# Patient Record
Sex: Female | Born: 1935
Health system: Southern US, Community
[De-identification: ages and names within clinical notes are randomized; demographics above are authoritative.]

## PROBLEM LIST (undated history)

## (undated) DIAGNOSIS — IMO0001 Reserved for inherently not codable concepts without codable children: Secondary | ICD-10-CM

## (undated) DIAGNOSIS — K219 Gastro-esophageal reflux disease without esophagitis: Secondary | ICD-10-CM

## (undated) DIAGNOSIS — M1990 Unspecified osteoarthritis, unspecified site: Secondary | ICD-10-CM

## (undated) DIAGNOSIS — E039 Hypothyroidism, unspecified: Secondary | ICD-10-CM

## (undated) DIAGNOSIS — K2270 Barrett's esophagus without dysplasia: Secondary | ICD-10-CM

## (undated) DIAGNOSIS — K754 Autoimmune hepatitis: Secondary | ICD-10-CM

## (undated) DIAGNOSIS — D649 Anemia, unspecified: Secondary | ICD-10-CM

## (undated) DIAGNOSIS — I10 Essential (primary) hypertension: Secondary | ICD-10-CM

## (undated) DIAGNOSIS — E538 Deficiency of other specified B group vitamins: Secondary | ICD-10-CM

## (undated) DIAGNOSIS — N189 Chronic kidney disease, unspecified: Secondary | ICD-10-CM

## (undated) DIAGNOSIS — N6019 Diffuse cystic mastopathy of unspecified breast: Secondary | ICD-10-CM

## (undated) DIAGNOSIS — K5790 Diverticulosis of intestine, part unspecified, without perforation or abscess without bleeding: Secondary | ICD-10-CM

## (undated) DIAGNOSIS — L98499 Non-pressure chronic ulcer of skin of other sites with unspecified severity: Secondary | ICD-10-CM

## (undated) DIAGNOSIS — R001 Bradycardia, unspecified: Secondary | ICD-10-CM

## (undated) HISTORY — DX: Autoimmune hepatitis: K75.4

## (undated) HISTORY — DX: Barrett's esophagus without dysplasia: K22.70

## (undated) HISTORY — DX: Reserved for inherently not codable concepts without codable children: IMO0001

## (undated) HISTORY — DX: Non-pressure chronic ulcer of skin of other sites with unspecified severity: L98.499

## (undated) HISTORY — DX: Hypothyroidism, unspecified: E03.9

## (undated) HISTORY — DX: Diverticulosis of intestine, part unspecified, without perforation or abscess without bleeding: K57.90

## (undated) HISTORY — DX: Gastro-esophageal reflux disease without esophagitis: K21.9

## (undated) HISTORY — DX: Anemia, unspecified: D64.9

## (undated) HISTORY — DX: Essential (primary) hypertension: I10

## (undated) HISTORY — DX: Diffuse cystic mastopathy of unspecified breast: N60.19

## (undated) HISTORY — PX: HERNIA REPAIR: SHX51

## (undated) HISTORY — DX: Deficiency of other specified B group vitamins: E53.8

---

## 1980-01-01 HISTORY — PX: TUBAL LIGATION: SHX77

## 1992-01-01 HISTORY — PX: LEFT OOPHORECTOMY: SHX1961

## 2004-11-01 ENCOUNTER — Ambulatory Visit: Payer: Self-pay | Admitting: Internal Medicine

## 2005-11-05 ENCOUNTER — Ambulatory Visit: Payer: Self-pay | Admitting: Internal Medicine

## 2006-11-06 ENCOUNTER — Ambulatory Visit: Payer: Self-pay | Admitting: Internal Medicine

## 2007-04-28 ENCOUNTER — Ambulatory Visit: Payer: Self-pay | Admitting: Gastroenterology

## 2007-04-30 ENCOUNTER — Ambulatory Visit: Payer: Self-pay | Admitting: Gastroenterology

## 2007-09-26 ENCOUNTER — Ambulatory Visit: Payer: Self-pay | Admitting: Internal Medicine

## 2008-01-15 ENCOUNTER — Ambulatory Visit: Payer: Self-pay | Admitting: Internal Medicine

## 2008-04-15 ENCOUNTER — Ambulatory Visit: Payer: Self-pay | Admitting: Internal Medicine

## 2008-07-06 ENCOUNTER — Other Ambulatory Visit: Payer: Self-pay

## 2008-07-07 ENCOUNTER — Inpatient Hospital Stay: Payer: Self-pay | Admitting: Surgery

## 2008-07-08 HISTORY — PX: OTHER SURGICAL HISTORY: SHX169

## 2008-10-27 ENCOUNTER — Inpatient Hospital Stay: Payer: Self-pay | Admitting: Internal Medicine

## 2009-01-28 ENCOUNTER — Ambulatory Visit: Payer: Self-pay | Admitting: Internal Medicine

## 2009-09-22 ENCOUNTER — Ambulatory Visit: Payer: Self-pay | Admitting: Gastroenterology

## 2010-01-30 ENCOUNTER — Ambulatory Visit: Payer: Self-pay | Admitting: Internal Medicine

## 2010-09-26 ENCOUNTER — Ambulatory Visit: Payer: Self-pay | Admitting: Gastroenterology

## 2010-09-27 ENCOUNTER — Ambulatory Visit: Payer: Self-pay | Admitting: Gastroenterology

## 2011-02-08 ENCOUNTER — Ambulatory Visit: Payer: Self-pay | Admitting: Internal Medicine

## 2011-09-06 ENCOUNTER — Ambulatory Visit: Payer: Self-pay | Admitting: Internal Medicine

## 2011-10-24 ENCOUNTER — Ambulatory Visit: Payer: Self-pay | Admitting: Unknown Physician Specialty

## 2012-01-03 DIAGNOSIS — D510 Vitamin B12 deficiency anemia due to intrinsic factor deficiency: Secondary | ICD-10-CM | POA: Diagnosis not present

## 2012-01-23 LAB — HM MAMMOGRAPHY

## 2012-01-24 DIAGNOSIS — I10 Essential (primary) hypertension: Secondary | ICD-10-CM | POA: Diagnosis not present

## 2012-01-24 DIAGNOSIS — M79609 Pain in unspecified limb: Secondary | ICD-10-CM | POA: Diagnosis not present

## 2012-01-24 DIAGNOSIS — D649 Anemia, unspecified: Secondary | ICD-10-CM | POA: Diagnosis not present

## 2012-01-24 DIAGNOSIS — K219 Gastro-esophageal reflux disease without esophagitis: Secondary | ICD-10-CM | POA: Diagnosis not present

## 2012-01-28 DIAGNOSIS — N840 Polyp of corpus uteri: Secondary | ICD-10-CM | POA: Diagnosis not present

## 2012-01-28 DIAGNOSIS — N841 Polyp of cervix uteri: Secondary | ICD-10-CM | POA: Diagnosis not present

## 2012-01-29 DIAGNOSIS — R5383 Other fatigue: Secondary | ICD-10-CM | POA: Diagnosis not present

## 2012-01-29 DIAGNOSIS — R5381 Other malaise: Secondary | ICD-10-CM | POA: Diagnosis not present

## 2012-01-29 DIAGNOSIS — E7800 Pure hypercholesterolemia, unspecified: Secondary | ICD-10-CM | POA: Diagnosis not present

## 2012-01-29 DIAGNOSIS — E039 Hypothyroidism, unspecified: Secondary | ICD-10-CM | POA: Diagnosis not present

## 2012-01-29 DIAGNOSIS — D649 Anemia, unspecified: Secondary | ICD-10-CM | POA: Diagnosis not present

## 2012-01-31 DIAGNOSIS — Z1211 Encounter for screening for malignant neoplasm of colon: Secondary | ICD-10-CM | POA: Diagnosis not present

## 2012-02-05 DIAGNOSIS — D510 Vitamin B12 deficiency anemia due to intrinsic factor deficiency: Secondary | ICD-10-CM | POA: Diagnosis not present

## 2012-02-06 DIAGNOSIS — N8500 Endometrial hyperplasia, unspecified: Secondary | ICD-10-CM | POA: Diagnosis not present

## 2012-02-06 DIAGNOSIS — N840 Polyp of corpus uteri: Secondary | ICD-10-CM | POA: Diagnosis not present

## 2012-02-07 ENCOUNTER — Ambulatory Visit: Payer: Self-pay | Admitting: Obstetrics and Gynecology

## 2012-02-07 DIAGNOSIS — Z01812 Encounter for preprocedural laboratory examination: Secondary | ICD-10-CM | POA: Diagnosis not present

## 2012-02-07 DIAGNOSIS — N841 Polyp of cervix uteri: Secondary | ICD-10-CM | POA: Diagnosis not present

## 2012-02-07 DIAGNOSIS — I119 Hypertensive heart disease without heart failure: Secondary | ICD-10-CM | POA: Diagnosis not present

## 2012-02-07 DIAGNOSIS — Z01810 Encounter for preprocedural cardiovascular examination: Secondary | ICD-10-CM | POA: Diagnosis not present

## 2012-02-07 LAB — URINALYSIS, COMPLETE
Bacteria: NONE SEEN
Bilirubin,UR: NEGATIVE
Glucose,UR: NEGATIVE mg/dL (ref 0–75)
Nitrite: NEGATIVE
Ph: 5 (ref 4.5–8.0)
RBC,UR: 13 /HPF (ref 0–5)
Specific Gravity: 1.023 (ref 1.003–1.030)
Squamous Epithelial: 3
WBC UR: 14 /HPF (ref 0–5)

## 2012-02-08 ENCOUNTER — Ambulatory Visit: Payer: Self-pay | Admitting: Obstetrics and Gynecology

## 2012-02-08 DIAGNOSIS — K449 Diaphragmatic hernia without obstruction or gangrene: Secondary | ICD-10-CM | POA: Diagnosis not present

## 2012-02-08 DIAGNOSIS — K219 Gastro-esophageal reflux disease without esophagitis: Secondary | ICD-10-CM | POA: Diagnosis not present

## 2012-02-08 DIAGNOSIS — K625 Hemorrhage of anus and rectum: Secondary | ICD-10-CM | POA: Diagnosis not present

## 2012-02-08 DIAGNOSIS — E7800 Pure hypercholesterolemia, unspecified: Secondary | ICD-10-CM | POA: Diagnosis not present

## 2012-02-08 DIAGNOSIS — R9389 Abnormal findings on diagnostic imaging of other specified body structures: Secondary | ICD-10-CM | POA: Diagnosis not present

## 2012-02-08 DIAGNOSIS — Z7982 Long term (current) use of aspirin: Secondary | ICD-10-CM | POA: Diagnosis not present

## 2012-02-08 DIAGNOSIS — E039 Hypothyroidism, unspecified: Secondary | ICD-10-CM | POA: Diagnosis not present

## 2012-02-08 DIAGNOSIS — E538 Deficiency of other specified B group vitamins: Secondary | ICD-10-CM | POA: Diagnosis not present

## 2012-02-08 DIAGNOSIS — I10 Essential (primary) hypertension: Secondary | ICD-10-CM | POA: Diagnosis not present

## 2012-02-08 DIAGNOSIS — N840 Polyp of corpus uteri: Secondary | ICD-10-CM | POA: Diagnosis not present

## 2012-02-08 DIAGNOSIS — Z79899 Other long term (current) drug therapy: Secondary | ICD-10-CM | POA: Diagnosis not present

## 2012-02-08 DIAGNOSIS — D259 Leiomyoma of uterus, unspecified: Secondary | ICD-10-CM | POA: Diagnosis not present

## 2012-02-08 DIAGNOSIS — N6019 Diffuse cystic mastopathy of unspecified breast: Secondary | ICD-10-CM | POA: Diagnosis not present

## 2012-02-08 DIAGNOSIS — IMO0002 Reserved for concepts with insufficient information to code with codable children: Secondary | ICD-10-CM | POA: Diagnosis not present

## 2012-03-07 DIAGNOSIS — D510 Vitamin B12 deficiency anemia due to intrinsic factor deficiency: Secondary | ICD-10-CM | POA: Diagnosis not present

## 2012-03-12 ENCOUNTER — Ambulatory Visit: Payer: Self-pay | Admitting: Internal Medicine

## 2012-03-12 DIAGNOSIS — Z1231 Encounter for screening mammogram for malignant neoplasm of breast: Secondary | ICD-10-CM | POA: Diagnosis not present

## 2012-04-01 DIAGNOSIS — M1710 Unilateral primary osteoarthritis, unspecified knee: Secondary | ICD-10-CM | POA: Diagnosis not present

## 2012-04-07 DIAGNOSIS — D510 Vitamin B12 deficiency anemia due to intrinsic factor deficiency: Secondary | ICD-10-CM | POA: Diagnosis not present

## 2012-04-17 DIAGNOSIS — H01009 Unspecified blepharitis unspecified eye, unspecified eyelid: Secondary | ICD-10-CM | POA: Diagnosis not present

## 2012-05-09 DIAGNOSIS — M1710 Unilateral primary osteoarthritis, unspecified knee: Secondary | ICD-10-CM | POA: Diagnosis not present

## 2012-05-12 DIAGNOSIS — D510 Vitamin B12 deficiency anemia due to intrinsic factor deficiency: Secondary | ICD-10-CM | POA: Diagnosis not present

## 2012-05-19 DIAGNOSIS — E782 Mixed hyperlipidemia: Secondary | ICD-10-CM | POA: Diagnosis not present

## 2012-05-19 DIAGNOSIS — I10 Essential (primary) hypertension: Secondary | ICD-10-CM | POA: Diagnosis not present

## 2012-05-19 DIAGNOSIS — D649 Anemia, unspecified: Secondary | ICD-10-CM | POA: Diagnosis not present

## 2012-05-20 DIAGNOSIS — D649 Anemia, unspecified: Secondary | ICD-10-CM | POA: Diagnosis not present

## 2012-05-20 DIAGNOSIS — E039 Hypothyroidism, unspecified: Secondary | ICD-10-CM | POA: Diagnosis not present

## 2012-05-20 DIAGNOSIS — I10 Essential (primary) hypertension: Secondary | ICD-10-CM | POA: Diagnosis not present

## 2012-06-12 DIAGNOSIS — D510 Vitamin B12 deficiency anemia due to intrinsic factor deficiency: Secondary | ICD-10-CM | POA: Diagnosis not present

## 2012-07-08 DIAGNOSIS — M1710 Unilateral primary osteoarthritis, unspecified knee: Secondary | ICD-10-CM | POA: Diagnosis not present

## 2012-07-17 DIAGNOSIS — D510 Vitamin B12 deficiency anemia due to intrinsic factor deficiency: Secondary | ICD-10-CM | POA: Diagnosis not present

## 2012-09-11 DIAGNOSIS — Z23 Encounter for immunization: Secondary | ICD-10-CM | POA: Diagnosis not present

## 2012-09-25 ENCOUNTER — Other Ambulatory Visit: Payer: Self-pay | Admitting: Gastroenterology

## 2012-09-25 DIAGNOSIS — R197 Diarrhea, unspecified: Secondary | ICD-10-CM | POA: Diagnosis not present

## 2012-09-27 LAB — STOOL CULTURE

## 2012-10-07 ENCOUNTER — Other Ambulatory Visit: Payer: Self-pay | Admitting: *Deleted

## 2012-10-07 MED ORDER — ATENOLOL 25 MG PO TABS: 25.0000 mg | ORAL_TABLET | Freq: Every day | ORAL | Status: DC

## 2012-10-07 NOTE — Telephone Encounter (Signed)
Rx faxed to pharmacy on 10/03/2012.

## 2012-10-09 MED ORDER — ATENOLOL 25 MG PO TABS: 25.0000 mg | ORAL_TABLET | Freq: Every day | ORAL | Status: DC

## 2012-10-09 NOTE — Addendum Note (Signed)
Addended by: Luisa Dago on: 10/09/2012 05:15 PM   Modules accepted: Orders

## 2012-10-09 NOTE — Telephone Encounter (Signed)
RX was previously marked as "No Print".  RX re-sent.

## 2012-10-13 ENCOUNTER — Other Ambulatory Visit: Payer: Self-pay

## 2012-10-15 ENCOUNTER — Other Ambulatory Visit: Payer: Self-pay | Admitting: *Deleted

## 2012-10-15 DIAGNOSIS — R198 Other specified symptoms and signs involving the digestive system and abdomen: Secondary | ICD-10-CM | POA: Diagnosis not present

## 2012-10-15 DIAGNOSIS — R197 Diarrhea, unspecified: Secondary | ICD-10-CM | POA: Diagnosis not present

## 2012-10-15 DIAGNOSIS — K2270 Barrett's esophagus without dysplasia: Secondary | ICD-10-CM | POA: Diagnosis not present

## 2012-10-15 DIAGNOSIS — D510 Vitamin B12 deficiency anemia due to intrinsic factor deficiency: Secondary | ICD-10-CM | POA: Diagnosis not present

## 2012-10-15 MED ORDER — CALCITONIN (SALMON) 200 UNIT/ACT NA SOLN: 1.0000 | Freq: Every day | NASAL | Status: DC

## 2012-10-15 NOTE — Telephone Encounter (Signed)
R'cd fax from [facility] for refill of Fortical, rx faxed to [facility].

## 2012-10-16 ENCOUNTER — Other Ambulatory Visit: Payer: Self-pay | Admitting: Gastroenterology

## 2012-10-16 DIAGNOSIS — R197 Diarrhea, unspecified: Secondary | ICD-10-CM | POA: Diagnosis not present

## 2012-10-31 ENCOUNTER — Telehealth: Payer: Self-pay | Admitting: Internal Medicine

## 2012-10-31 NOTE — Telephone Encounter (Signed)
Refill request for atenolol 25 mg  Sig: take one tablet by mouth every day Patient has an appt on 12/23

## 2012-11-05 NOTE — Telephone Encounter (Signed)
Dr. Lorin Picket patient is also requesting a refill of her Ambien CR 6.25mg   1 tablet po at HS. She has an appt scheduled for 12/23 /13. Is it okay to provide 1 refill to get to the scheduled appt.

## 2012-11-05 NOTE — Telephone Encounter (Signed)
Ok to give #30 with one refill.  Thanks.

## 2012-11-06 ENCOUNTER — Telehealth: Payer: Self-pay | Admitting: Internal Medicine

## 2012-11-06 MED ORDER — ATENOLOL 25 MG PO TABS: 25.0000 mg | ORAL_TABLET | Freq: Every day | ORAL | Status: DC

## 2012-11-06 MED ORDER — ZOLPIDEM TARTRATE ER 6.25 MG PO TBCR: 6.2500 mg | EXTENDED_RELEASE_TABLET | Freq: Every evening | ORAL | Status: DC | PRN

## 2012-11-06 NOTE — Telephone Encounter (Signed)
See phone note dated 11/1 for additional information. Scripts sent to pharmacy. Message left for patient on # provided.

## 2012-11-06 NOTE — Telephone Encounter (Signed)
Pt came in  Stating that [facility] has been sending a refill request since last Thursday Pt is completely out of meds  Zolpidem tartrate er 6.25mg  ter Generic for [employer] cr 6.25mg  t  Take 1 tablet by mouth every night at bedtime  Atenolol 25mg  tab Take 1 tablet by mouth every day  Please advise pt when this is called in

## 2012-11-06 NOTE — Telephone Encounter (Signed)
Atenolol sent electronically to pharmacy with Ambien being called in. Patient called and is aware message left on mobile # provided

## 2012-12-05 ENCOUNTER — Ambulatory Visit: Payer: Self-pay | Admitting: Gastroenterology

## 2012-12-05 DIAGNOSIS — N6019 Diffuse cystic mastopathy of unspecified breast: Secondary | ICD-10-CM | POA: Diagnosis not present

## 2012-12-05 DIAGNOSIS — K2940 Chronic atrophic gastritis without bleeding: Secondary | ICD-10-CM | POA: Diagnosis not present

## 2012-12-05 DIAGNOSIS — D131 Benign neoplasm of stomach: Secondary | ICD-10-CM | POA: Diagnosis not present

## 2012-12-05 DIAGNOSIS — K5730 Diverticulosis of large intestine without perforation or abscess without bleeding: Secondary | ICD-10-CM | POA: Diagnosis not present

## 2012-12-05 DIAGNOSIS — Z880 Allergy status to penicillin: Secondary | ICD-10-CM | POA: Diagnosis not present

## 2012-12-05 DIAGNOSIS — Z7982 Long term (current) use of aspirin: Secondary | ICD-10-CM | POA: Diagnosis not present

## 2012-12-05 DIAGNOSIS — R197 Diarrhea, unspecified: Secondary | ICD-10-CM | POA: Diagnosis not present

## 2012-12-05 DIAGNOSIS — K259 Gastric ulcer, unspecified as acute or chronic, without hemorrhage or perforation: Secondary | ICD-10-CM | POA: Diagnosis not present

## 2012-12-05 DIAGNOSIS — I10 Essential (primary) hypertension: Secondary | ICD-10-CM | POA: Diagnosis not present

## 2012-12-05 DIAGNOSIS — D649 Anemia, unspecified: Secondary | ICD-10-CM | POA: Diagnosis not present

## 2012-12-05 DIAGNOSIS — D126 Benign neoplasm of colon, unspecified: Secondary | ICD-10-CM | POA: Diagnosis not present

## 2012-12-05 DIAGNOSIS — R198 Other specified symptoms and signs involving the digestive system and abdomen: Secondary | ICD-10-CM | POA: Diagnosis not present

## 2012-12-05 DIAGNOSIS — K219 Gastro-esophageal reflux disease without esophagitis: Secondary | ICD-10-CM | POA: Diagnosis not present

## 2012-12-05 DIAGNOSIS — Z9079 Acquired absence of other genital organ(s): Secondary | ICD-10-CM | POA: Diagnosis not present

## 2012-12-05 DIAGNOSIS — K319 Disease of stomach and duodenum, unspecified: Secondary | ICD-10-CM | POA: Diagnosis not present

## 2012-12-05 DIAGNOSIS — Z79899 Other long term (current) drug therapy: Secondary | ICD-10-CM | POA: Diagnosis not present

## 2012-12-05 DIAGNOSIS — K449 Diaphragmatic hernia without obstruction or gangrene: Secondary | ICD-10-CM | POA: Diagnosis not present

## 2012-12-05 DIAGNOSIS — K2990 Gastroduodenitis, unspecified, without bleeding: Secondary | ICD-10-CM | POA: Diagnosis not present

## 2012-12-05 DIAGNOSIS — M1990 Unspecified osteoarthritis, unspecified site: Secondary | ICD-10-CM | POA: Diagnosis not present

## 2012-12-05 DIAGNOSIS — K2970 Gastritis, unspecified, without bleeding: Secondary | ICD-10-CM | POA: Diagnosis not present

## 2012-12-05 DIAGNOSIS — Z888 Allergy status to other drugs, medicaments and biological substances status: Secondary | ICD-10-CM | POA: Diagnosis not present

## 2012-12-05 DIAGNOSIS — E538 Deficiency of other specified B group vitamins: Secondary | ICD-10-CM | POA: Diagnosis not present

## 2012-12-05 DIAGNOSIS — K2270 Barrett's esophagus without dysplasia: Secondary | ICD-10-CM | POA: Diagnosis not present

## 2012-12-05 LAB — HM COLONOSCOPY

## 2012-12-15 DIAGNOSIS — K2270 Barrett's esophagus without dysplasia: Secondary | ICD-10-CM | POA: Diagnosis not present

## 2012-12-15 DIAGNOSIS — K219 Gastro-esophageal reflux disease without esophagitis: Secondary | ICD-10-CM | POA: Diagnosis not present

## 2012-12-15 DIAGNOSIS — K5289 Other specified noninfective gastroenteritis and colitis: Secondary | ICD-10-CM | POA: Diagnosis not present

## 2012-12-15 DIAGNOSIS — K259 Gastric ulcer, unspecified as acute or chronic, without hemorrhage or perforation: Secondary | ICD-10-CM | POA: Diagnosis not present

## 2012-12-16 ENCOUNTER — Telehealth: Payer: Self-pay | Admitting: Internal Medicine

## 2012-12-16 NOTE — Telephone Encounter (Signed)
Drug Name- Synthroid 75 mcg tab  Directions- Take one tablet by mouth every day  Quantity- 30

## 2012-12-18 MED ORDER — LEVOTHYROXINE SODIUM 75 MCG PO TABS: 75.0000 ug | ORAL_TABLET | Freq: Every day | ORAL | Status: DC

## 2012-12-18 NOTE — Telephone Encounter (Signed)
Patient is out of medication

## 2012-12-18 NOTE — Telephone Encounter (Signed)
Called prescription in to pharmacy 

## 2012-12-22 ENCOUNTER — Encounter: Payer: Self-pay | Admitting: Internal Medicine

## 2012-12-22 ENCOUNTER — Ambulatory Visit (INDEPENDENT_AMBULATORY_CARE_PROVIDER_SITE_OTHER): Payer: Medicare Other | Admitting: Internal Medicine

## 2012-12-22 VITALS — BP 120/70 | HR 52 | Temp 98.1°F | Ht 60.0 in | Wt 170.0 lb

## 2012-12-22 DIAGNOSIS — E039 Hypothyroidism, unspecified: Secondary | ICD-10-CM

## 2012-12-22 DIAGNOSIS — I10 Essential (primary) hypertension: Secondary | ICD-10-CM | POA: Diagnosis not present

## 2012-12-22 DIAGNOSIS — N289 Disorder of kidney and ureter, unspecified: Secondary | ICD-10-CM

## 2012-12-22 DIAGNOSIS — K3184 Gastroparesis: Secondary | ICD-10-CM

## 2012-12-22 DIAGNOSIS — E538 Deficiency of other specified B group vitamins: Secondary | ICD-10-CM | POA: Diagnosis not present

## 2012-12-22 DIAGNOSIS — K209 Esophagitis, unspecified without bleeding: Secondary | ICD-10-CM

## 2012-12-22 DIAGNOSIS — N183 Chronic kidney disease, stage 3 unspecified: Secondary | ICD-10-CM | POA: Insufficient documentation

## 2012-12-22 DIAGNOSIS — K2270 Barrett's esophagus without dysplasia: Secondary | ICD-10-CM

## 2012-12-22 DIAGNOSIS — D649 Anemia, unspecified: Secondary | ICD-10-CM | POA: Diagnosis not present

## 2012-12-22 MED ORDER — ZOLPIDEM TARTRATE ER 6.25 MG PO TBCR
6.2500 mg | EXTENDED_RELEASE_TABLET | Freq: Every evening | ORAL | Status: DC | PRN
Start: 2012-12-22 — End: 2013-05-05

## 2012-12-22 MED ORDER — LEVOTHYROXINE SODIUM 75 MCG PO TABS: 75.0000 ug | ORAL_TABLET | Freq: Every day | ORAL | Status: DC

## 2012-12-22 MED ORDER — CYANOCOBALAMIN 1000 MCG/ML IJ SOLN
1000.0000 ug | Freq: Once | INTRAMUSCULAR | Status: AC
  Administered 2012-12-22: 1000 ug via INTRAMUSCULAR

## 2012-12-27 ENCOUNTER — Encounter: Payer: Self-pay | Admitting: Internal Medicine

## 2012-12-27 NOTE — Assessment & Plan Note (Signed)
Blood pressure under good control.  Same meds.  Follow.  Check metabolic panel.     

## 2012-12-27 NOTE — Assessment & Plan Note (Signed)
On Protonix and carafate now.  EGD 12/05/12 - revealed esophageal changes secondary to Barrett's, hiatal hernia, gastritis and ulceration.  Due follow up EGD 2/14.  Asymptomatic.

## 2012-12-27 NOTE — Assessment & Plan Note (Signed)
Currently doing well.  Follow.  Same meds.  Did not tolerate Reglan.

## 2012-12-27 NOTE — Assessment & Plan Note (Signed)
Is iron deficient and B12 deficient.  Check cbc/ferritin.

## 2012-12-27 NOTE — Progress Notes (Signed)
Subjective:    Patient ID: Lori [name], female    DOB: 1934-11-19, 77 y.o.   MRN: [id number]  HPI 78 year old female with past history of hypertension, hiatal hernia, reflux disease and iron and B12 deficiency who comes in today for a scheduled follow up.  She states she has been doing relatively well.  Had an EGD 9/13.  On protonix now.  Upper symptoms controlled.  Still having flares of intermittent diarrhea.  Now occurring every 8-9 days.  Had a recent colonoscopy and evaluation by GI.  Reports breathing is stable.  No cardiac symptoms with increased activity or exertion.  Eating and drinking well.  No blood in the stool.    Past Medical History  Diagnosis Date  . Hypertension   . Hypothyroidism   . GERD (gastroesophageal reflux disease)   . Hypercholesterolemia   . Ulcer disease   . Diverticulosis   . Anemia     iron deficient  . B12 deficiency   . Fibrocystic breast disease     Current Outpatient Prescriptions on File Prior to Visit  Medication Sig Dispense Refill  . atenolol (TENORMIN) 25 MG tablet Take 1 tablet (25 mg total) by mouth daily.  30 tablet  2  . benazepril (LOTENSIN) 40 MG tablet Take 40 mg by mouth daily.      . calcitonin, salmon, (FORTICAL) 200 UNIT/ACT nasal spray Place 1 spray into the nose daily. Alternate nostrils  3.7 mL  11  . Calcium Citrate (CITRACAL PO) Take 1,200 mg by mouth daily.      . pantoprazole (PROTONIX) 40 MG tablet Take 40 mg by mouth 2 (two) times daily.      . sucralfate (CARAFATE) 1 G tablet Take 1 g by mouth 4 (four) times daily.      Marland Kitchen zolpidem (AMBIEN CR) 6.25 MG CR tablet Take 1 tablet (6.25 mg total) by mouth at bedtime as needed for sleep.  30 tablet  1    Review of Systems Patient denies any headache, lightheadedness or dizziness.  No sinus or allergy symptoms.   No chest pain, tightness or palpitations.  No increased shortness of breath, cough or congestion.  No nausea or vomiting.  Upper symptoms controlled.   No abdominal pain  or cramping.  No BRBPR or melana.  Persistent flares of diarrhea as outlined above.  No urine change.   Uses ambien to help her sleep.  Knee is doing better after Synvisc injection.      Objective:   Physical Exam Filed Vitals:   12/22/12 1438  BP: 120/70  Pulse: 52  Temp: 98.1 F (48.22 C)   78 year old female in no acute distress.   HEENT:  Nares - clear.  OP- without lesions or erythema.  NECK:  Supple, nontender.  No audible bruit.   HEART:  Appears to be regular. LUNGS:  Without crackles or wheezing audible.  Respirations even and unlabored.   RADIAL PULSE:  Equal bilaterally.  ABDOMEN:  Soft, nontender.  No audible abdominal bruit.   EXTREMITIES:  No increased edema to be present.                   Assessment & Plan:  MSK.  Knee doing better after Synvisc injection.  Follow.  Saw Dr Erin Sons.    GYN.  Has had no further bleeding or spotting.  Pap 01/11/11 negative.  Has seen Dr Logan Bores.  Had a large cervical polyp removed.  Follow.    HEALTH  MAINTENANCE.  Physical 01/24/12.  Pap as outlined.  Colonoscopy 12/05/12 revealed diverticulosis in the sigmoid colon, descending colon, transverse colon and ascending colon.  Exam otherwise normal.  Bone density 01/15/11 revealed osteopenia (decreased from previous).  Received Reclast 05/24/11.  Mammogram 03/12/12 - BiRADS I.

## 2012-12-27 NOTE — Assessment & Plan Note (Signed)
Continue B12 injections.   

## 2012-12-27 NOTE — Assessment & Plan Note (Signed)
U/A negative for protein.  Renal ultrasound negative.  SIEP and UPEP negative.  Calcium and ionized calcium elevated.  Was referred to [facility] nephrology.  Most recent Cr - 1.1 (05/19/12).  Follow.  Calcium was decreased.  Last calcium wnl.

## 2012-12-27 NOTE — Assessment & Plan Note (Signed)
On Synthroid.  Check tsh.  

## 2013-01-02 ENCOUNTER — Other Ambulatory Visit (INDEPENDENT_AMBULATORY_CARE_PROVIDER_SITE_OTHER): Payer: Medicare Other

## 2013-01-02 ENCOUNTER — Other Ambulatory Visit: Payer: Self-pay | Admitting: Internal Medicine

## 2013-01-02 DIAGNOSIS — D649 Anemia, unspecified: Secondary | ICD-10-CM | POA: Diagnosis not present

## 2013-01-02 DIAGNOSIS — I10 Essential (primary) hypertension: Secondary | ICD-10-CM | POA: Diagnosis not present

## 2013-01-02 DIAGNOSIS — R17 Unspecified jaundice: Secondary | ICD-10-CM

## 2013-01-02 DIAGNOSIS — E039 Hypothyroidism, unspecified: Secondary | ICD-10-CM | POA: Diagnosis not present

## 2013-01-02 LAB — CBC WITH DIFFERENTIAL/PLATELET
Basophils Absolute: 0 10*3/uL (ref 0.0–0.1)
Eosinophils Absolute: 0.1 10*3/uL (ref 0.0–0.7)
HCT: 35.6 % — ABNORMAL LOW (ref 36.0–46.0)
Hemoglobin: 12.1 g/dL (ref 12.0–15.0)
Lymphs Abs: 1.5 10*3/uL (ref 0.7–4.0)
MCHC: 34 g/dL (ref 30.0–36.0)
MCV: 89.4 fl (ref 78.0–100.0)
Monocytes Relative: 11.3 % (ref 3.0–12.0)
Neutro Abs: 2.5 10*3/uL (ref 1.4–7.7)
RDW: 13.6 % (ref 11.5–14.6)

## 2013-01-02 LAB — BILIRUBIN, DIRECT: Bilirubin, Direct: 0.2 mg/dL (ref 0.0–0.3)

## 2013-01-02 LAB — COMPREHENSIVE METABOLIC PANEL
ALT: 13 U/L (ref 0–35)
AST: 18 U/L (ref 0–37)
Alkaline Phosphatase: 38 U/L — ABNORMAL LOW (ref 39–117)
Sodium: 139 mEq/L (ref 135–145)
Total Bilirubin: 1.5 mg/dL — ABNORMAL HIGH (ref 0.3–1.2)
Total Protein: 7 g/dL (ref 6.0–8.3)

## 2013-01-02 LAB — LIPID PANEL: VLDL: 18.4 mg/dL (ref 0.0–40.0)

## 2013-01-02 NOTE — Progress Notes (Signed)
Added direct bili

## 2013-01-03 ENCOUNTER — Other Ambulatory Visit: Payer: Self-pay | Admitting: Internal Medicine

## 2013-01-03 NOTE — Progress Notes (Signed)
Pt notified of lab results and need for a follow up liver panel.  Please put her on the lab schedule for 01/08/13 at 10:30.  Pt aware of appt.  Thanks.

## 2013-01-07 ENCOUNTER — Telehealth: Payer: Self-pay | Admitting: Internal Medicine

## 2013-01-07 NOTE — Telephone Encounter (Signed)
Dr. Lorin Picket the patient has a appointment 02/26/13 She is requesting refills. She will have ran out by her appointment

## 2013-01-07 NOTE — Telephone Encounter (Signed)
Called [facility] - refilled ambien 5mg  q hs prn #30 with one refill.

## 2013-01-07 NOTE — Telephone Encounter (Signed)
zolpidem (AMBIEN CR) 6.25 MG CR tablet  # 30

## 2013-01-08 ENCOUNTER — Other Ambulatory Visit (INDEPENDENT_AMBULATORY_CARE_PROVIDER_SITE_OTHER): Payer: Medicare Other

## 2013-01-08 DIAGNOSIS — R17 Unspecified jaundice: Secondary | ICD-10-CM | POA: Diagnosis not present

## 2013-01-08 LAB — HEPATIC FUNCTION PANEL
AST: 17 U/L (ref 0–37)
Total Bilirubin: 1.4 mg/dL — ABNORMAL HIGH (ref 0.3–1.2)

## 2013-01-09 ENCOUNTER — Encounter: Payer: Self-pay | Admitting: Internal Medicine

## 2013-01-22 ENCOUNTER — Ambulatory Visit (INDEPENDENT_AMBULATORY_CARE_PROVIDER_SITE_OTHER): Payer: Medicare Other | Admitting: *Deleted

## 2013-01-22 DIAGNOSIS — E538 Deficiency of other specified B group vitamins: Secondary | ICD-10-CM

## 2013-01-22 MED ORDER — CYANOCOBALAMIN 1000 MCG/ML IJ SOLN
1000.0000 ug | Freq: Once | INTRAMUSCULAR | Status: AC
  Administered 2013-01-22: 1000 ug via INTRAMUSCULAR

## 2013-02-02 ENCOUNTER — Other Ambulatory Visit: Payer: Self-pay | Admitting: Internal Medicine

## 2013-02-05 ENCOUNTER — Ambulatory Visit: Payer: Self-pay | Admitting: Gastroenterology

## 2013-02-05 DIAGNOSIS — IMO0002 Reserved for concepts with insufficient information to code with codable children: Secondary | ICD-10-CM | POA: Diagnosis not present

## 2013-02-05 DIAGNOSIS — K219 Gastro-esophageal reflux disease without esophagitis: Secondary | ICD-10-CM | POA: Diagnosis not present

## 2013-02-05 DIAGNOSIS — K449 Diaphragmatic hernia without obstruction or gangrene: Secondary | ICD-10-CM | POA: Diagnosis not present

## 2013-02-05 DIAGNOSIS — K2270 Barrett's esophagus without dysplasia: Secondary | ICD-10-CM | POA: Diagnosis not present

## 2013-02-05 DIAGNOSIS — E7800 Pure hypercholesterolemia, unspecified: Secondary | ICD-10-CM | POA: Diagnosis not present

## 2013-02-05 DIAGNOSIS — Z888 Allergy status to other drugs, medicaments and biological substances status: Secondary | ICD-10-CM | POA: Diagnosis not present

## 2013-02-05 DIAGNOSIS — Z8489 Family history of other specified conditions: Secondary | ICD-10-CM | POA: Diagnosis not present

## 2013-02-05 DIAGNOSIS — Z980 Intestinal bypass and anastomosis status: Secondary | ICD-10-CM | POA: Diagnosis not present

## 2013-02-05 DIAGNOSIS — Z79899 Other long term (current) drug therapy: Secondary | ICD-10-CM | POA: Diagnosis not present

## 2013-02-05 DIAGNOSIS — H9190 Unspecified hearing loss, unspecified ear: Secondary | ICD-10-CM | POA: Diagnosis not present

## 2013-02-05 DIAGNOSIS — I10 Essential (primary) hypertension: Secondary | ICD-10-CM | POA: Diagnosis not present

## 2013-02-05 DIAGNOSIS — K259 Gastric ulcer, unspecified as acute or chronic, without hemorrhage or perforation: Secondary | ICD-10-CM | POA: Diagnosis not present

## 2013-02-05 DIAGNOSIS — D510 Vitamin B12 deficiency anemia due to intrinsic factor deficiency: Secondary | ICD-10-CM | POA: Diagnosis not present

## 2013-02-05 DIAGNOSIS — E039 Hypothyroidism, unspecified: Secondary | ICD-10-CM | POA: Diagnosis not present

## 2013-02-05 DIAGNOSIS — K625 Hemorrhage of anus and rectum: Secondary | ICD-10-CM | POA: Diagnosis not present

## 2013-02-05 DIAGNOSIS — Z8711 Personal history of peptic ulcer disease: Secondary | ICD-10-CM | POA: Diagnosis not present

## 2013-02-05 DIAGNOSIS — Z880 Allergy status to penicillin: Secondary | ICD-10-CM | POA: Diagnosis not present

## 2013-02-14 ENCOUNTER — Other Ambulatory Visit: Payer: Self-pay

## 2013-02-24 ENCOUNTER — Ambulatory Visit: Payer: Medicare Other

## 2013-02-26 ENCOUNTER — Other Ambulatory Visit (HOSPITAL_COMMUNITY)
Admission: RE | Admit: 2013-02-26 | Discharge: 2013-02-26 | Disposition: A | Discharge status: Home / Self Care | Payer: Medicare Other | Source: Ambulatory Visit | Attending: Internal Medicine | Admitting: Internal Medicine

## 2013-02-26 ENCOUNTER — Ambulatory Visit (INDEPENDENT_AMBULATORY_CARE_PROVIDER_SITE_OTHER): Payer: Medicare Other | Admitting: Internal Medicine

## 2013-02-26 ENCOUNTER — Telehealth: Payer: Self-pay | Admitting: Internal Medicine

## 2013-02-26 ENCOUNTER — Encounter: Payer: Self-pay | Admitting: Internal Medicine

## 2013-02-26 VITALS — BP 120/80 | HR 60 | Temp 98.0°F | Ht 60.0 in | Wt 169.8 lb

## 2013-02-26 DIAGNOSIS — E039 Hypothyroidism, unspecified: Secondary | ICD-10-CM

## 2013-02-26 DIAGNOSIS — Z1151 Encounter for screening for human papillomavirus (HPV): Secondary | ICD-10-CM | POA: Diagnosis not present

## 2013-02-26 DIAGNOSIS — E538 Deficiency of other specified B group vitamins: Secondary | ICD-10-CM

## 2013-02-26 DIAGNOSIS — K3184 Gastroparesis: Secondary | ICD-10-CM

## 2013-02-26 DIAGNOSIS — Z1239 Encounter for other screening for malignant neoplasm of breast: Secondary | ICD-10-CM | POA: Diagnosis not present

## 2013-02-26 DIAGNOSIS — K2270 Barrett's esophagus without dysplasia: Secondary | ICD-10-CM

## 2013-02-26 DIAGNOSIS — I10 Essential (primary) hypertension: Secondary | ICD-10-CM

## 2013-02-26 DIAGNOSIS — D649 Anemia, unspecified: Secondary | ICD-10-CM

## 2013-02-26 DIAGNOSIS — Z124 Encounter for screening for malignant neoplasm of cervix: Secondary | ICD-10-CM

## 2013-02-26 DIAGNOSIS — M8580 Other specified disorders of bone density and structure, unspecified site: Secondary | ICD-10-CM

## 2013-02-26 DIAGNOSIS — N289 Disorder of kidney and ureter, unspecified: Secondary | ICD-10-CM

## 2013-02-26 LAB — HEPATIC FUNCTION PANEL
Bilirubin, Direct: 0.1 mg/dL (ref 0.0–0.3)
Total Bilirubin: 1 mg/dL (ref 0.3–1.2)

## 2013-02-26 NOTE — Telephone Encounter (Signed)
Pt was in today and forgot to tell you she needs rx on ambeim cr  [facility]

## 2013-02-27 ENCOUNTER — Encounter: Payer: Self-pay | Admitting: Internal Medicine

## 2013-02-27 MED ORDER — CYANOCOBALAMIN 1000 MCG/ML IJ SOLN
1000.0000 ug | Freq: Once | INTRAMUSCULAR | Status: AC
  Administered 2013-02-27: 1000 ug via INTRAMUSCULAR

## 2013-03-02 ENCOUNTER — Encounter: Payer: Self-pay | Admitting: Internal Medicine

## 2013-03-02 NOTE — Assessment & Plan Note (Signed)
Recheck liver panel today.  

## 2013-03-02 NOTE — Telephone Encounter (Signed)
Called in [place] #30 with one refill

## 2013-03-02 NOTE — Assessment & Plan Note (Signed)
Currently doing well.  Follow.  Same meds.  Did not tolerate Reglan.   

## 2013-03-02 NOTE — Assessment & Plan Note (Signed)
Is iron deficient and B12 deficient.  Follow cbc/ferritin.    

## 2013-03-02 NOTE — Assessment & Plan Note (Signed)
U/A negative for protein.  Renal ultrasound negative.  SIEP and UPEP negative.  Calcium and ionized calcium elevated.  Was referred to [facility] nephrology.   Last calcium wnl.  Follow.

## 2013-03-02 NOTE — Assessment & Plan Note (Signed)
Continue B12 injections.   

## 2013-03-02 NOTE — Assessment & Plan Note (Signed)
On Synthroid.  Follow tsh.    

## 2013-03-02 NOTE — Assessment & Plan Note (Signed)
Received reclast.  Last bone density 2012.  Schedule a follow up bone density.

## 2013-03-02 NOTE — Progress Notes (Signed)
Subjective:    Patient ID: Lori [name], female    DOB: 09/02/1934, 79 y.o.   MRN: [id number]  HPI 79 year old female with past history of hypertension, hiatal hernia, reflux disease and iron and B12 deficiency who comes in today to follow up on these issues as well as for a complete physical exam.  She states she has been doing relatively well.  Had an EGD 9/13.  On protonix.   Upper symptoms controlled. Had follow up EGD 02/05/13.  Ulcer "gone" per pt.  Has follow up with Dr Marva Panda in three months.  Still having flares of intermittent diarrhea.  Had a recent colonoscopy and evaluation by GI.  Trying to stick with foods that will not aggravate her bowels.   Reports breathing is stable.  No cardiac symptoms with increased activity or exertion.  No blood in the stool.     Past Medical History  Diagnosis Date  . Hypertension   . Hypothyroidism   . GERD (gastroesophageal reflux disease)   . Hypercholesterolemia   . Ulcer disease   . Diverticulosis   . Anemia     iron deficient  . B12 deficiency   . Fibrocystic breast disease     Current Outpatient Prescriptions on File Prior to Visit  Medication Sig Dispense Refill  . aspirin 81 MG tablet Take 81 mg by mouth daily.      Marland Kitchen atenolol (TENORMIN) 25 MG tablet TAKE ONE (1) TABLET BY MOUTH EVERY DAY  30 tablet  6  . benazepril (LOTENSIN) 40 MG tablet TAKE ONE (1) TABLET BY MOUTH EVERY DAY  30 tablet  6  . calcitonin, salmon, (FORTICAL) 200 UNIT/ACT nasal spray Place 1 spray into the nose daily. Alternate nostrils  3.7 mL  11  . Calcium Citrate (CITRACAL PO) Take 1,200 mg by mouth daily.      . cyanocobalamin (,VITAMIN B-12,) 1000 MCG/ML injection Inject 1,000 mcg into the muscle every 30 (thirty) days.      . diphenoxylate-atropine (LOMOTIL) 2.5-0.025 MG per tablet Take 2 tablets by mouth 2 (two) times daily as needed.      . Glucosamine-Chondroitin 1500-1200 MG/30ML LIQD Take by mouth 2 (two) times daily.      Marland Kitchen levothyroxine (SYNTHROID) 50  MCG tablet Take 50 mcg by mouth daily.      . pantoprazole (PROTONIX) 40 MG tablet Take 40 mg by mouth daily.       . Probiotic Product (ALIGN) 4 MG CAPS Take 1 capsule by mouth daily.      . Psyllium (METAMUCIL PO) Take by mouth daily.      . sucralfate (CARAFATE) 1 G tablet Take 1 g by mouth 2 (two) times daily.       . zoledronic acid (RECLAST) 5 MG/100ML SOLN q year      . zolpidem (AMBIEN CR) 6.25 MG CR tablet Take 1 tablet (6.25 mg total) by mouth at bedtime as needed for sleep.  30 tablet  1   No current facility-administered medications on file prior to visit.    Review of Systems Patient denies any headache, lightheadedness or dizziness.  No sinus or allergy symptoms.   No chest pain, tightness or palpitations.  No increased shortness of breath, cough or congestion.  No nausea or vomiting.  Upper symptoms controlled.   No abdominal pain or cramping.  No BRBPR or melana.  Persistent flares of diarrhea as outlined above.  No urine change.   Uses ambien to help her sleep.  Knee  is doing better after Synvisc injection.      Objective:   Physical Exam  Filed Vitals:   02/26/13 1103  BP: 120/80  Pulse: 60  Temp: 98 F (36.7 C)   Blood pressure recheck:  130/72, pulse 54  79 year old female in no acute distress.   HEENT:  Nares- clear.  Oropharynx - without lesions. NECK:  Supple.  Nontender.  No audible bruit.  HEART:  Appears to be regular. LUNGS:  No crackles or wheezing audible.  Respirations even and unlabored.  RADIAL PULSE:  Equal bilaterally.    BREASTS:  No nipple discharge or nipple retraction present.  Could not appreciate any distinct nodules or axillary adenopathy.  ABDOMEN:  Soft, nontender.  Bowel sounds present and normal.  No audible abdominal bruit.  GU:  Normal external genitalia.  Vaginal vault without lesions.  Cervix identified.  Pap performed. Could not appreciate any adnexal masses or tenderness.   RECTAL:  Heme negative.   EXTREMITIES:  No increased  edema present.  DP pulses palpable and equal bilaterally.          Assessment & Plan:  MSK.  Knee doing better after Synvisc injection.  Follow.  Saw Dr Erin Sons.    GYN.  Has had no further bleeding or spotting.  Pap 01/11/11 negative.  Has seen Dr Logan Bores.  Had a large cervical polyp removed.  Follow.  Repeat pap today.   HEALTH MAINTENANCE.  Physical today.  Pap as outlined.  Colonoscopy 12/05/12 revealed diverticulosis in the sigmoid colon, descending colon, transverse colon and ascending colon.  Exam otherwise normal.  Bone density 01/15/11 revealed osteopenia (decreased from previous).  Received Reclast 05/24/11.  Mammogram 03/12/12 - BiRADS I.  Schedule follow up mammogram.  Schedule follow up bone density.

## 2013-03-02 NOTE — Assessment & Plan Note (Signed)
Blood pressure under good control.  Same meds.  Follow.  Follow metabolic panel.   

## 2013-03-02 NOTE — Assessment & Plan Note (Signed)
On Protonix and carafate now.  EGD 12/05/12 - revealed esophageal changes secondary to Barrett's, hiatal hernia, gastritis and ulceration.  Had repeat EGD 02/05/13. Ulcer had healed per pt. Currently upper symptoms controlled.  Follow.   

## 2013-03-20 ENCOUNTER — Telehealth: Payer: Self-pay | Admitting: *Deleted

## 2013-03-20 NOTE — Telephone Encounter (Signed)
Called patient with her bone density results. 

## 2013-03-27 ENCOUNTER — Ambulatory Visit: Payer: Self-pay | Admitting: Internal Medicine

## 2013-03-27 DIAGNOSIS — Z1231 Encounter for screening mammogram for malignant neoplasm of breast: Secondary | ICD-10-CM | POA: Diagnosis not present

## 2013-03-28 ENCOUNTER — Encounter: Payer: Self-pay | Admitting: Internal Medicine

## 2013-04-08 ENCOUNTER — Encounter: Payer: Self-pay | Admitting: Internal Medicine

## 2013-04-14 ENCOUNTER — Encounter: Payer: Self-pay | Admitting: Internal Medicine

## 2013-05-05 ENCOUNTER — Other Ambulatory Visit: Payer: Self-pay | Admitting: Internal Medicine

## 2013-05-06 DIAGNOSIS — K2270 Barrett's esophagus without dysplasia: Secondary | ICD-10-CM | POA: Diagnosis not present

## 2013-05-06 DIAGNOSIS — K5289 Other specified noninfective gastroenteritis and colitis: Secondary | ICD-10-CM | POA: Diagnosis not present

## 2013-05-07 DIAGNOSIS — H2510 Age-related nuclear cataract, unspecified eye: Secondary | ICD-10-CM | POA: Diagnosis not present

## 2013-06-30 ENCOUNTER — Encounter: Payer: Self-pay | Admitting: Internal Medicine

## 2013-06-30 ENCOUNTER — Ambulatory Visit (INDEPENDENT_AMBULATORY_CARE_PROVIDER_SITE_OTHER): Payer: Medicare Other | Admitting: Internal Medicine

## 2013-06-30 VITALS — BP 130/70 | HR 55 | Temp 97.9°F | Ht 60.0 in | Wt 171.2 lb

## 2013-06-30 DIAGNOSIS — M8580 Other specified disorders of bone density and structure, unspecified site: Secondary | ICD-10-CM

## 2013-06-30 DIAGNOSIS — K2270 Barrett's esophagus without dysplasia: Secondary | ICD-10-CM

## 2013-06-30 DIAGNOSIS — I10 Essential (primary) hypertension: Secondary | ICD-10-CM | POA: Diagnosis not present

## 2013-06-30 DIAGNOSIS — R252 Cramp and spasm: Secondary | ICD-10-CM

## 2013-06-30 DIAGNOSIS — K3184 Gastroparesis: Secondary | ICD-10-CM

## 2013-06-30 DIAGNOSIS — E039 Hypothyroidism, unspecified: Secondary | ICD-10-CM

## 2013-06-30 DIAGNOSIS — D649 Anemia, unspecified: Secondary | ICD-10-CM | POA: Diagnosis not present

## 2013-06-30 DIAGNOSIS — N289 Disorder of kidney and ureter, unspecified: Secondary | ICD-10-CM

## 2013-06-30 DIAGNOSIS — R17 Unspecified jaundice: Secondary | ICD-10-CM

## 2013-06-30 DIAGNOSIS — K209 Esophagitis, unspecified without bleeding: Secondary | ICD-10-CM

## 2013-06-30 DIAGNOSIS — E538 Deficiency of other specified B group vitamins: Secondary | ICD-10-CM | POA: Diagnosis not present

## 2013-06-30 DIAGNOSIS — M899 Disorder of bone, unspecified: Secondary | ICD-10-CM

## 2013-06-30 LAB — COMPREHENSIVE METABOLIC PANEL
BUN: 23 mg/dL (ref 6–23)
CO2: 27 mEq/L (ref 19–32)
Calcium: 10.1 mg/dL (ref 8.4–10.5)
Chloride: 104 mEq/L (ref 96–112)
Creatinine, Ser: 1.1 mg/dL (ref 0.4–1.2)
GFR: 51.22 mL/min — ABNORMAL LOW (ref >60.00)
Total Bilirubin: 1 mg/dL (ref 0.3–1.2)

## 2013-06-30 LAB — MAGNESIUM: Magnesium: 1.9 mg/dL (ref 1.5–2.5)

## 2013-06-30 MED ORDER — ZOLPIDEM TARTRATE ER 6.25 MG PO TBCR: 6.2500 mg | EXTENDED_RELEASE_TABLET | Freq: Every evening | ORAL | Status: DC | PRN

## 2013-06-30 MED ORDER — CHLORZOXAZONE 500 MG PO TABS: ORAL_TABLET | ORAL | Status: DC

## 2013-06-30 MED ORDER — CYANOCOBALAMIN 1000 MCG/ML IJ SOLN
1000.0000 ug | Freq: Once | INTRAMUSCULAR | Status: AC
  Administered 2013-06-30: 1000 ug via INTRAMUSCULAR

## 2013-07-01 ENCOUNTER — Encounter: Payer: Self-pay | Admitting: Internal Medicine

## 2013-07-01 NOTE — Assessment & Plan Note (Signed)
Currently stable.  Follow.   

## 2013-07-01 NOTE — Assessment & Plan Note (Signed)
On Synthroid.  Follow tsh.    

## 2013-07-01 NOTE — Assessment & Plan Note (Signed)
Received reclast.  Last bone density 3/14 - improved.  Follow.   

## 2013-07-01 NOTE — Assessment & Plan Note (Signed)
Blood pressure under good control.  Same meds.  Follow.  Follow metabolic panel.   

## 2013-07-01 NOTE — Assessment & Plan Note (Signed)
Last bilirubin level wnl.    

## 2013-07-01 NOTE — Assessment & Plan Note (Signed)
U/A negative for protein.  Renal ultrasound negative.  SIEP and UPEP negative.  Calcium and ionized calcium elevated.  Was referred to [facility] nephrology.   Last calcium wnl.  Follow.

## 2013-07-01 NOTE — Assessment & Plan Note (Signed)
Is iron deficient and B12 deficient.  Follow cbc/ferritin.    

## 2013-07-01 NOTE — Progress Notes (Signed)
Subjective:    Patient ID: Lori [name], female    DOB: [birth year], 79 y.o.   MRN: [id number]  HPI 79 year old female with past history of hypertension, hiatal hernia, reflux disease and iron and B12 deficiency who comes in today for a scheduled follow up.  She states she has been doing relatively well.  Had an EGD 9/13.  On protonix.   Upper symptoms controlled. Had follow up EGD 02/05/13.  Ulcer "gone" per pt.  Just saw Dr Marva Panda.  Diagnosed with gastroparesis.  Still having flares of intermittent diarrhea.  Had a recent colonoscopy and evaluation by GI.  Trying to stick with foods that will not aggravate her bowels.  Also trying a low fiber diet.   Reports breathing is stable.  No cardiac symptoms with increased activity or exertion.  No blood in the stool.  Had the synvisc injection last year.  Has done well with this.  States she is just now starting to feel a little tenderness in her knee.  Will f/u with ortho when feels needed.  She does report some increased foot and leg cramps.  Flared recently.  Still needing the ambien to help her sleep.    Past Medical History  Diagnosis Date  . Hypertension   . Hypothyroidism   . GERD (gastroesophageal reflux disease)   . Hypercholesterolemia   . Ulcer disease   . Diverticulosis   . Anemia     iron deficient  . B12 deficiency   . Fibrocystic breast disease     Current Outpatient Prescriptions on File Prior to Visit  Medication Sig Dispense Refill  . aspirin 81 MG tablet Take 81 mg by mouth daily.      Marland Kitchen atenolol (TENORMIN) 25 MG tablet TAKE ONE (1) TABLET BY MOUTH EVERY DAY  30 tablet  6  . benazepril (LOTENSIN) 40 MG tablet TAKE ONE (1) TABLET BY MOUTH EVERY DAY  30 tablet  6  . calcitonin, salmon, (FORTICAL) 200 UNIT/ACT nasal spray Place 1 spray into the nose daily. Alternate nostrils  3.7 mL  11  . Calcium Citrate (CITRACAL PO) Take 1,200 mg by mouth daily.      . cyanocobalamin (,VITAMIN B-12,) 1000 MCG/ML injection Inject 1,000 mcg  into the muscle every 30 (thirty) days.      . diphenoxylate-atropine (LOMOTIL) 2.5-0.025 MG per tablet Take 2 tablets by mouth 2 (two) times daily as needed.      . Glucosamine-Chondroitin 1500-1200 MG/30ML LIQD Take by mouth 2 (two) times daily.      Marland Kitchen levothyroxine (SYNTHROID) 50 MCG tablet Take 50 mcg by mouth daily.      . pantoprazole (PROTONIX) 40 MG tablet Take 40 mg by mouth daily.       . Probiotic Product (ALIGN) 4 MG CAPS Take 1 capsule by mouth daily.      . Psyllium (METAMUCIL PO) Take by mouth daily.       No current facility-administered medications on file prior to visit.    Review of Systems Patient denies any headache, lightheadedness or dizziness.  No sinus or allergy symptoms.   No chest pain, tightness or palpitations.  No increased shortness of breath, cough or congestion.  No nausea or vomiting.  Upper symptoms controlled.   No abdominal pain or cramping.  No BRBPR or melana.  Persistent flares of diarrhea as outlined above.  No urine change.   Uses ambien to help her sleep.  Knee did well after Synvisc injection.  Some tenderness now.  Has gastroparesis.  Trying to adjust her diet.  Reports having cramps in her legs/feet as outlined above.       Objective:   Physical Exam  Filed Vitals:   06/30/13 1054  BP: 130/70  Pulse: 55  Temp: 97.9 F (56.40 C)   79 year old female in no acute distress.   HEENT:  Nares- clear.  Oropharynx - without lesions. NECK:  Supple.  Nontender.  No audible bruit.  HEART:  Appears to be regular. LUNGS:  No crackles or wheezing audible.  Respirations even and unlabored.  RADIAL PULSE:  Equal bilaterally. ABDOMEN:  Soft, nontender.  Bowel sounds present and normal.  No audible abdominal bruit.    EXTREMITIES:  No increased edema present.  DP pulses palpable and equal bilaterally.   MSK:  No pain to palpation over her lower legs.         Assessment & Plan:  MSK.  Knee did well after Synvisc injection.  Follow.  Will f/u with ortho  as needed.    LEG CRAMPS.  Exam and symptoms as outlined.  Check metabolic panel and magnesium.  Refilled her muscle relaxer.  Follow.  She knows not to take her muscle relaxer with her ambien.      GYN.  Has had no further bleeding or spotting.  Pap 01/11/11 negative.  Has seen Dr Logan Bores.  Had a large cervical polyp removed.  Pap 02/26/13 normal.    HEALTH MAINTENANCE.  Physical 02/26/13.  Pap as outlined.  Colonoscopy 12/05/12 revealed diverticulosis in the sigmoid colon, descending colon, transverse colon and ascending colon.  Exam otherwise normal.  Bone density 01/15/11 revealed osteopenia (decreased from previous).  F/u bone density in 3/14 - improved.  Received Reclast 05/24/11.  Mammogram 03/17/13 - Birads II.

## 2013-07-01 NOTE — Assessment & Plan Note (Signed)
On Protonix and carafate now.  EGD 12/05/12 - revealed esophageal changes secondary to Barrett's, hiatal hernia, gastritis and ulceration.  Had repeat EGD 02/05/13. Ulcer had healed per pt. Currently upper symptoms controlled.  Follow.   

## 2013-07-01 NOTE — Assessment & Plan Note (Signed)
Continue B12 injections.   

## 2013-07-07 DIAGNOSIS — H169 Unspecified keratitis: Secondary | ICD-10-CM | POA: Diagnosis not present

## 2013-07-14 DIAGNOSIS — H01009 Unspecified blepharitis unspecified eye, unspecified eyelid: Secondary | ICD-10-CM | POA: Diagnosis not present

## 2013-08-04 ENCOUNTER — Ambulatory Visit (INDEPENDENT_AMBULATORY_CARE_PROVIDER_SITE_OTHER): Payer: Medicare Other | Admitting: *Deleted

## 2013-08-04 DIAGNOSIS — E538 Deficiency of other specified B group vitamins: Secondary | ICD-10-CM | POA: Diagnosis not present

## 2013-08-04 MED ORDER — CYANOCOBALAMIN 1000 MCG/ML IJ SOLN
1000.0000 ug | Freq: Once | INTRAMUSCULAR | Status: AC
  Administered 2013-08-04: 1000 ug via INTRAMUSCULAR

## 2013-08-05 ENCOUNTER — Other Ambulatory Visit: Payer: Self-pay

## 2013-08-17 DIAGNOSIS — H905 Unspecified sensorineural hearing loss: Secondary | ICD-10-CM | POA: Diagnosis not present

## 2013-09-02 ENCOUNTER — Other Ambulatory Visit: Payer: Self-pay | Admitting: *Deleted

## 2013-09-02 MED ORDER — BENAZEPRIL HCL 40 MG PO TABS: ORAL_TABLET | ORAL | Status: DC

## 2013-09-04 ENCOUNTER — Other Ambulatory Visit: Payer: Self-pay | Admitting: *Deleted

## 2013-09-04 MED ORDER — ATENOLOL 25 MG PO TABS: 25.0000 mg | ORAL_TABLET | Freq: Every day | ORAL | Status: DC

## 2013-09-04 NOTE — Telephone Encounter (Signed)
Pt is completely out

## 2013-09-04 NOTE — Telephone Encounter (Signed)
Note states pt completely out.  I sent in rx for atenolol #30 with 5 refills.

## 2013-09-08 ENCOUNTER — Ambulatory Visit (INDEPENDENT_AMBULATORY_CARE_PROVIDER_SITE_OTHER): Payer: Medicare Other | Admitting: *Deleted

## 2013-09-08 ENCOUNTER — Telehealth: Payer: Self-pay | Admitting: Internal Medicine

## 2013-09-08 DIAGNOSIS — E538 Deficiency of other specified B group vitamins: Secondary | ICD-10-CM

## 2013-09-08 MED ORDER — ZOLPIDEM TARTRATE ER 6.25 MG PO TBCR: 6.2500 mg | EXTENDED_RELEASE_TABLET | Freq: Every evening | ORAL | Status: DC | PRN

## 2013-09-08 MED ORDER — CYANOCOBALAMIN 1000 MCG/ML IJ SOLN
1000.0000 ug | Freq: Once | INTRAMUSCULAR | Status: AC
  Administered 2013-09-08: 1000 ug via INTRAMUSCULAR

## 2013-09-08 NOTE — Telephone Encounter (Signed)
Rx printed/faxed.  

## 2013-09-08 NOTE — Telephone Encounter (Signed)
Pt is needing refill on Zolpidem Tartrate ER 6.25mg . Pt uses [facility]

## 2013-09-16 DIAGNOSIS — K5289 Other specified noninfective gastroenteritis and colitis: Secondary | ICD-10-CM | POA: Diagnosis not present

## 2013-09-16 DIAGNOSIS — K2270 Barrett's esophagus without dysplasia: Secondary | ICD-10-CM | POA: Diagnosis not present

## 2013-10-08 ENCOUNTER — Ambulatory Visit: Payer: Medicare Other

## 2013-10-09 ENCOUNTER — Ambulatory Visit (INDEPENDENT_AMBULATORY_CARE_PROVIDER_SITE_OTHER): Payer: Medicare Other | Admitting: *Deleted

## 2013-10-09 DIAGNOSIS — E538 Deficiency of other specified B group vitamins: Secondary | ICD-10-CM

## 2013-10-09 MED ORDER — CYANOCOBALAMIN 1000 MCG/ML IJ SOLN
1000.0000 ug | Freq: Once | INTRAMUSCULAR | Status: AC
  Administered 2013-10-09: 1000 ug via INTRAMUSCULAR

## 2013-10-14 DIAGNOSIS — Z23 Encounter for immunization: Secondary | ICD-10-CM | POA: Diagnosis not present

## 2013-11-04 ENCOUNTER — Ambulatory Visit (INDEPENDENT_AMBULATORY_CARE_PROVIDER_SITE_OTHER): Payer: Medicare Other | Admitting: Internal Medicine

## 2013-11-04 ENCOUNTER — Encounter: Payer: Self-pay | Admitting: Internal Medicine

## 2013-11-04 VITALS — BP 120/80 | HR 59 | Temp 97.9°F | Ht 60.0 in | Wt 174.2 lb

## 2013-11-04 DIAGNOSIS — R17 Unspecified jaundice: Secondary | ICD-10-CM

## 2013-11-04 DIAGNOSIS — N289 Disorder of kidney and ureter, unspecified: Secondary | ICD-10-CM

## 2013-11-04 DIAGNOSIS — M899 Disorder of bone, unspecified: Secondary | ICD-10-CM

## 2013-11-04 DIAGNOSIS — K3184 Gastroparesis: Secondary | ICD-10-CM

## 2013-11-04 DIAGNOSIS — D649 Anemia, unspecified: Secondary | ICD-10-CM

## 2013-11-04 DIAGNOSIS — E039 Hypothyroidism, unspecified: Secondary | ICD-10-CM

## 2013-11-04 DIAGNOSIS — K2270 Barrett's esophagus without dysplasia: Secondary | ICD-10-CM

## 2013-11-04 DIAGNOSIS — I10 Essential (primary) hypertension: Secondary | ICD-10-CM | POA: Diagnosis not present

## 2013-11-04 DIAGNOSIS — E538 Deficiency of other specified B group vitamins: Secondary | ICD-10-CM

## 2013-11-04 DIAGNOSIS — M8580 Other specified disorders of bone density and structure, unspecified site: Secondary | ICD-10-CM

## 2013-11-04 LAB — BASIC METABOLIC PANEL
BUN: 28 mg/dL — ABNORMAL HIGH (ref 6–23)
Calcium: 9.6 mg/dL (ref 8.4–10.5)
Creatinine, Ser: 1.1 mg/dL (ref 0.4–1.2)
GFR: 54 mL/min — ABNORMAL LOW (ref >60.00)

## 2013-11-04 LAB — HEPATIC FUNCTION PANEL
Bilirubin, Direct: 0.1 mg/dL (ref 0.0–0.3)
Total Bilirubin: 0.8 mg/dL (ref 0.3–1.2)

## 2013-11-04 NOTE — Progress Notes (Signed)
Pre-visit discussion using our clinic review tool. No additional management support is needed unless otherwise documented below in the visit note.  

## 2013-11-05 ENCOUNTER — Other Ambulatory Visit: Payer: Self-pay | Admitting: Internal Medicine

## 2013-11-08 ENCOUNTER — Encounter: Payer: Self-pay | Admitting: Internal Medicine

## 2013-11-08 MED ORDER — LEVOTHYROXINE SODIUM 50 MCG PO TABS: 50.0000 ug | ORAL_TABLET | Freq: Every day | ORAL | Status: DC

## 2013-11-08 MED ORDER — ZOLPIDEM TARTRATE ER 6.25 MG PO TBCR: 6.2500 mg | EXTENDED_RELEASE_TABLET | Freq: Every evening | ORAL | Status: DC | PRN

## 2013-11-08 NOTE — Assessment & Plan Note (Signed)
Continue B12 injections.   

## 2013-11-08 NOTE — Progress Notes (Signed)
Subjective:    Patient ID: Lori [name], female    DOB: 1934-02-25, 79 y.o.   MRN: [id number]  HPI 79 year old female with past history of hypertension, hiatal hernia, reflux disease and iron and B12 deficiency who comes in today for a scheduled follow up.  She states she has been doing relatively well.  Had an EGD 9/13.  On protonix.   Upper symptoms controlled. Had follow up EGD 02/05/13.  Ulcer "gone" per pt.  Just saw Dr Marva Panda.  Diagnosed with gastroparesis.  Still having flares of intermittent diarrhea.  Monitoring her diet closely.  Is better.  Also trying a low fiber diet.   Reports breathing is stable.  No cardiac symptoms with increased activity or exertion.  No blood in the stool.  Had the synvisc injection last year.  Has done well with this.  States she is just now starting to feel a little tenderness in her knee.  Will f/u with ortho when feels needed.  Still needing the ambien to help her sleep.  Blood pressure has been doing well.   Past Medical History  Diagnosis Date  . Hypertension   . Hypothyroidism   . GERD (gastroesophageal reflux disease)   . Hypercholesterolemia   . Ulcer disease   . Diverticulosis   . Anemia     iron deficient  . B12 deficiency   . Fibrocystic breast disease     Current Outpatient Prescriptions on File Prior to Visit  Medication Sig Dispense Refill  . aspirin 81 MG tablet Take 81 mg by mouth daily.      Marland Kitchen atenolol (TENORMIN) 25 MG tablet Take 1 tablet (25 mg total) by mouth daily.  30 tablet  6  . benazepril (LOTENSIN) 40 MG tablet TAKE ONE (1) TABLET BY MOUTH EVERY DAY  30 tablet  5  . Calcium Citrate (CITRACAL PO) Take 1,200 mg by mouth daily.      . cyanocobalamin (,VITAMIN B-12,) 1000 MCG/ML injection Inject 1,000 mcg into the muscle every 30 (thirty) days.      . Glucosamine-Chondroitin 1500-1200 MG/30ML LIQD Take by mouth 2 (two) times daily.      Marland Kitchen levothyroxine (SYNTHROID) 50 MCG tablet Take 50 mcg by mouth daily.      . Mesalamine  (DELZICOL) 400 MG CPDR Take 400 mg by mouth 3 (three) times daily.      . pantoprazole (PROTONIX) 40 MG tablet Take 40 mg by mouth daily.       . Probiotic Product (ALIGN) 4 MG CAPS Take 1 capsule by mouth daily.      . Psyllium (METAMUCIL PO) Take by mouth daily.      Marland Kitchen zolpidem (AMBIEN CR) 6.25 MG CR tablet Take 1 tablet (6.25 mg total) by mouth at bedtime as needed for sleep.  30 tablet  1   No current facility-administered medications on file prior to visit.    Review of Systems Patient denies any headache, lightheadedness or dizziness.  No sinus or allergy symptoms.   No chest pain, tightness or palpitations.  No increased shortness of breath, cough or congestion.  No nausea or vomiting.  Upper symptoms controlled.   No abdominal pain or cramping.  No BRBPR or melana.  Persistent flares of diarrhea as outlined above.  No urine change.   Uses ambien to help her sleep.  Knee did well after Synvisc injection.  Some tenderness now.  Has gastroparesis.  Trying to adjust her diet.  Was questioning when her las tetanus.  Objective:   Physical Exam  Filed Vitals:   11/04/13 1032  BP: 120/80  Pulse: 59  Temp: 97.9 F (36.6 C)   Blood pressure recheck:  22/103  79 year old female in no acute distress.   HEENT:  Nares- clear.  Oropharynx - without lesions. NECK:  Supple.  Nontender.  No audible bruit.  HEART:  Appears to be regular. LUNGS:  No crackles or wheezing audible.  Respirations even and unlabored.  RADIAL PULSE:  Equal bilaterally. ABDOMEN:  Soft, nontender.  Bowel sounds present and normal.  No audible abdominal bruit.    EXTREMITIES:  No increased edema present.  DP pulses palpable and equal bilaterally.   MSK:  No pain to palpation over her lower legs.         Assessment & Plan:  MSK.  Knee did well after Synvisc injection.  Follow.  Will f/u with ortho as needed.    GYN.  Has had no further bleeding or spotting.  Pap 01/11/11 negative.  Has seen Dr Logan Bores.  Had a  large cervical polyp removed.  Pap 02/26/13 normal.    HEALTH MAINTENANCE.  Physical 02/26/13.  Pap as outlined.  Colonoscopy 12/05/12 revealed diverticulosis in the sigmoid colon, descending colon, transverse colon and ascending colon.  Exam otherwise normal.  Bone density 01/15/11 revealed osteopenia (decreased from previous).  F/u bone density in 3/14 - improved.  Received Reclast 05/24/11.  Mammogram 03/17/13 - Birads II.   Obtain records for her immunizations.

## 2013-11-08 NOTE — Assessment & Plan Note (Signed)
Last bilirubin level wnl.    

## 2013-11-08 NOTE — Assessment & Plan Note (Signed)
Received reclast.  Last bone density 3/14 - improved.  Follow.  Did not tolerate reclast.  Continue vitamin d supplementation.

## 2013-11-08 NOTE — Assessment & Plan Note (Signed)
Currently stable.  Follow.   

## 2013-11-08 NOTE — Assessment & Plan Note (Signed)
Is iron deficient and B12 deficient.  Follow cbc/ferritin.    

## 2013-11-08 NOTE — Assessment & Plan Note (Signed)
Blood pressure under good control.  Same meds.  Follow.  Follow metabolic panel.   

## 2013-11-08 NOTE — Assessment & Plan Note (Signed)
On Protonix and carafate now.  EGD 12/05/12 - revealed esophageal changes secondary to Barrett's, hiatal hernia, gastritis and ulceration.  Had repeat EGD 02/05/13. Ulcer had healed per pt. Currently upper symptoms controlled.  Follow.   

## 2013-11-08 NOTE — Assessment & Plan Note (Signed)
On Synthroid.  Follow tsh.    

## 2013-11-08 NOTE — Assessment & Plan Note (Addendum)
U/A negative for protein.  Renal ultrasound negative.  SIEP and UPEP negative.  Calcium and ionized calcium elevated.  Was referred to [facility] nephrology.   Last calcium wnl.  Follow.  Cr 7/14 - 1.1.

## 2013-11-11 ENCOUNTER — Ambulatory Visit: Payer: Medicare Other

## 2013-11-13 ENCOUNTER — Encounter: Payer: Self-pay | Admitting: *Deleted

## 2013-11-13 ENCOUNTER — Ambulatory Visit (INDEPENDENT_AMBULATORY_CARE_PROVIDER_SITE_OTHER): Payer: Medicare Other | Admitting: *Deleted

## 2013-11-13 ENCOUNTER — Other Ambulatory Visit: Payer: Self-pay | Admitting: *Deleted

## 2013-11-13 DIAGNOSIS — E538 Deficiency of other specified B group vitamins: Secondary | ICD-10-CM

## 2013-11-13 MED ORDER — LEVOTHYROXINE SODIUM 75 MCG PO TABS: 75.0000 ug | ORAL_TABLET | Freq: Every day | ORAL | Status: DC

## 2013-11-13 MED ORDER — CYANOCOBALAMIN 1000 MCG/ML IJ SOLN
1000.0000 ug | Freq: Once | INTRAMUSCULAR | Status: AC
  Administered 2013-11-13: 1000 ug via INTRAMUSCULAR

## 2013-11-13 NOTE — Telephone Encounter (Signed)
Resent correct dosage for her thyroid medication ( ) & pt notified

## 2013-11-16 DIAGNOSIS — Z79899 Other long term (current) drug therapy: Secondary | ICD-10-CM | POA: Diagnosis not present

## 2013-11-23 DIAGNOSIS — M1710 Unilateral primary osteoarthritis, unspecified knee: Secondary | ICD-10-CM | POA: Diagnosis not present

## 2013-12-03 DIAGNOSIS — M1710 Unilateral primary osteoarthritis, unspecified knee: Secondary | ICD-10-CM | POA: Diagnosis not present

## 2013-12-14 ENCOUNTER — Ambulatory Visit (INDEPENDENT_AMBULATORY_CARE_PROVIDER_SITE_OTHER): Payer: Medicare Other | Admitting: *Deleted

## 2013-12-14 ENCOUNTER — Encounter: Payer: Self-pay | Admitting: Internal Medicine

## 2013-12-14 DIAGNOSIS — E538 Deficiency of other specified B group vitamins: Secondary | ICD-10-CM | POA: Diagnosis not present

## 2013-12-14 MED ORDER — CYANOCOBALAMIN 1000 MCG/ML IJ SOLN
1000.0000 ug | Freq: Once | INTRAMUSCULAR | Status: AC
  Administered 2013-12-14: 1000 ug via INTRAMUSCULAR

## 2014-01-07 ENCOUNTER — Other Ambulatory Visit: Payer: Self-pay | Admitting: Internal Medicine

## 2014-01-08 NOTE — Telephone Encounter (Signed)
Refill

## 2014-01-08 NOTE — Telephone Encounter (Signed)
ok'd refill ambien #30 with one refill.   

## 2014-01-15 ENCOUNTER — Ambulatory Visit (INDEPENDENT_AMBULATORY_CARE_PROVIDER_SITE_OTHER): Payer: Medicare Other | Admitting: *Deleted

## 2014-01-15 DIAGNOSIS — E538 Deficiency of other specified B group vitamins: Secondary | ICD-10-CM

## 2014-01-15 MED ORDER — CYANOCOBALAMIN 1000 MCG/ML IJ SOLN
1000.0000 ug | Freq: Once | INTRAMUSCULAR | Status: AC
  Administered 2014-01-15: 1000 ug via INTRAMUSCULAR

## 2014-02-16 ENCOUNTER — Ambulatory Visit: Payer: Medicare Other

## 2014-02-22 ENCOUNTER — Ambulatory Visit (INDEPENDENT_AMBULATORY_CARE_PROVIDER_SITE_OTHER): Payer: Medicare Other | Admitting: *Deleted

## 2014-02-22 DIAGNOSIS — E538 Deficiency of other specified B group vitamins: Secondary | ICD-10-CM | POA: Diagnosis not present

## 2014-02-22 MED ORDER — CYANOCOBALAMIN 1000 MCG/ML IJ SOLN
1000.0000 ug | Freq: Once | INTRAMUSCULAR | Status: AC
  Administered 2014-02-22: 1000 ug via INTRAMUSCULAR

## 2014-03-08 ENCOUNTER — Encounter: Payer: Self-pay | Admitting: Internal Medicine

## 2014-03-08 ENCOUNTER — Ambulatory Visit (INDEPENDENT_AMBULATORY_CARE_PROVIDER_SITE_OTHER): Payer: Medicare Other | Admitting: Internal Medicine

## 2014-03-08 VITALS — BP 132/72 | HR 55 | Temp 98.0°F | Ht 60.0 in | Wt 177.0 lb

## 2014-03-08 DIAGNOSIS — R17 Unspecified jaundice: Secondary | ICD-10-CM | POA: Diagnosis not present

## 2014-03-08 DIAGNOSIS — K2270 Barrett's esophagus without dysplasia: Secondary | ICD-10-CM

## 2014-03-08 DIAGNOSIS — Z23 Encounter for immunization: Secondary | ICD-10-CM | POA: Diagnosis not present

## 2014-03-08 DIAGNOSIS — Z13220 Encounter for screening for lipoid disorders: Secondary | ICD-10-CM | POA: Diagnosis not present

## 2014-03-08 DIAGNOSIS — I10 Essential (primary) hypertension: Secondary | ICD-10-CM | POA: Diagnosis not present

## 2014-03-08 DIAGNOSIS — D649 Anemia, unspecified: Secondary | ICD-10-CM

## 2014-03-08 DIAGNOSIS — E039 Hypothyroidism, unspecified: Secondary | ICD-10-CM | POA: Diagnosis not present

## 2014-03-08 DIAGNOSIS — K209 Esophagitis, unspecified without bleeding: Secondary | ICD-10-CM

## 2014-03-08 DIAGNOSIS — M949 Disorder of cartilage, unspecified: Secondary | ICD-10-CM

## 2014-03-08 DIAGNOSIS — M899 Disorder of bone, unspecified: Secondary | ICD-10-CM

## 2014-03-08 DIAGNOSIS — M8580 Other specified disorders of bone density and structure, unspecified site: Secondary | ICD-10-CM

## 2014-03-08 DIAGNOSIS — N289 Disorder of kidney and ureter, unspecified: Secondary | ICD-10-CM

## 2014-03-08 DIAGNOSIS — Z1239 Encounter for other screening for malignant neoplasm of breast: Secondary | ICD-10-CM

## 2014-03-08 DIAGNOSIS — E538 Deficiency of other specified B group vitamins: Secondary | ICD-10-CM

## 2014-03-08 DIAGNOSIS — K3184 Gastroparesis: Secondary | ICD-10-CM

## 2014-03-08 LAB — HEPATIC FUNCTION PANEL
ALK PHOS: 41 U/L (ref 39–117)
ALT: 17 U/L (ref 0–35)
AST: 18 U/L (ref 0–37)
Albumin: 4.1 g/dL (ref 3.5–5.2)
BILIRUBIN DIRECT: 0.2 mg/dL (ref 0.0–0.3)
TOTAL PROTEIN: 7.2 g/dL (ref 6.0–8.3)
Total Bilirubin: 1.2 mg/dL (ref 0.3–1.2)

## 2014-03-08 LAB — CBC WITH DIFFERENTIAL/PLATELET
BASOS ABS: 0 10*3/uL (ref 0.0–0.1)
Basophils Relative: 0.9 % (ref 0.0–3.0)
Eosinophils Absolute: 0.2 10*3/uL (ref 0.0–0.7)
Eosinophils Relative: 3.6 % (ref 0.0–5.0)
HEMATOCRIT: 36.4 % (ref 36.0–46.0)
Hemoglobin: 12.1 g/dL (ref 12.0–15.0)
LYMPHS ABS: 1.2 10*3/uL (ref 0.7–4.0)
Lymphocytes Relative: 24.5 % (ref 12.0–46.0)
MCHC: 33.4 g/dL (ref 30.0–36.0)
MCV: 90.1 fl (ref 78.0–100.0)
MONOS PCT: 10.7 % (ref 3.0–12.0)
Monocytes Absolute: 0.5 10*3/uL (ref 0.1–1.0)
Neutro Abs: 2.9 10*3/uL (ref 1.4–7.7)
Neutrophils Relative %: 60.3 % (ref 43.0–77.0)
Platelets: 217 10*3/uL (ref 150.0–400.0)
RBC: 4.04 Mil/uL (ref 3.87–5.11)
RDW: 12.8 % (ref 11.5–14.6)
WBC: 4.9 10*3/uL (ref 4.5–10.5)

## 2014-03-08 LAB — BASIC METABOLIC PANEL
BUN: 24 mg/dL — ABNORMAL HIGH (ref 6–23)
CHLORIDE: 111 meq/L (ref 96–112)
CO2: 25 meq/L (ref 19–32)
CREATININE: 1.1 mg/dL (ref 0.4–1.2)
Calcium: 9.7 mg/dL (ref 8.4–10.5)
GFR: 53.37 mL/min — ABNORMAL LOW (ref >60.00)
Glucose, Bld: 93 mg/dL (ref 70–99)
Potassium: 4.6 mEq/L (ref 3.5–5.1)
SODIUM: 146 meq/L — AB (ref 135–145)

## 2014-03-08 LAB — LIPID PANEL
Cholesterol: 192 mg/dL (ref 0–200)
HDL: 52 mg/dL (ref >39.00)
LDL Cholesterol: 116 mg/dL — ABNORMAL HIGH (ref 0–99)
TRIGLYCERIDES: 120 mg/dL (ref 0.0–149.0)
Total CHOL/HDL Ratio: 4
VLDL: 24 mg/dL (ref 0.0–40.0)

## 2014-03-08 LAB — FERRITIN: Ferritin: 40.3 ng/mL (ref 10.0–291.0)

## 2014-03-08 LAB — TSH: TSH: 1.43 u[IU]/mL (ref 0.35–5.50)

## 2014-03-08 MED ORDER — ZOLPIDEM TARTRATE ER 6.25 MG PO TBCR: 6.2500 mg | EXTENDED_RELEASE_TABLET | Freq: Every evening | ORAL | Status: DC | PRN

## 2014-03-08 MED ORDER — BENAZEPRIL HCL 40 MG PO TABS: ORAL_TABLET | ORAL | Status: DC

## 2014-03-08 MED ORDER — ATENOLOL 25 MG PO TABS: 25.0000 mg | ORAL_TABLET | Freq: Every day | ORAL | Status: DC

## 2014-03-08 NOTE — Assessment & Plan Note (Signed)
Received reclast.  Last bone density 3/14 - improved.  Follow.   

## 2014-03-08 NOTE — Assessment & Plan Note (Signed)
Last bilirubin level wnl.    

## 2014-03-08 NOTE — Assessment & Plan Note (Addendum)
U/A negative for protein.  Renal ultrasound negative.  SIEP and UPEP negative.  Calcium and ionized calcium elevated.  Was referred to [facility] nephrology.   Last calcium wnl.  Follow.  Cr 03/08/14 - 1.1.

## 2014-03-08 NOTE — Assessment & Plan Note (Signed)
On Synthroid.  Follow tsh.    

## 2014-03-08 NOTE — Assessment & Plan Note (Signed)
Continue B12 injections.   

## 2014-03-08 NOTE — Assessment & Plan Note (Signed)
Currently stable.  Follow.   

## 2014-03-08 NOTE — Assessment & Plan Note (Signed)
Blood pressure under good control.  Same meds.  Follow.  Follow metabolic panel.   

## 2014-03-08 NOTE — Progress Notes (Signed)
Pre-visit discussion using our clinic review tool. No additional management support is needed unless otherwise documented below in the visit note.  

## 2014-03-08 NOTE — Progress Notes (Signed)
Subjective:    Patient ID: Lori [name], female    DOB: 01-24-1934, 80 y.o.   MRN: [id number]  HPI 80 year old female with past history of hypertension, hiatal hernia, reflux disease and iron and B12 deficiency who comes in today to follow up on these issues as well as for a complete physical exam.  She states she has been doing relatively well.  Had an EGD 9/13.  On protonix.   Upper symptoms controlled.  Had follow up EGD 02/05/13.  Ulcer "gone" per pt.  Followed up with Dr Gustavo Lah.  Diagnosed with gastroparesis.  Still having flares of intermittent diarrhea.  Monitoring her diet closely.  Is better.  Also trying a low fiber diet.   Reports breathing is stable.  No cardiac symptoms with increased activity or exertion.  No blood in the stool.  S/p  synvisc injection last year.  Has done well with this.  Was starting to feel a little tenderness in her knee last visit.  Is s/p another injection.  Doing well.  Reports some aching in her thighs and upper legs at times.  She relates this to arthritis.  States is better once she starts moving around.  Still needing the ambien to help her sleep.  Blood pressure has been doing well.   Past Medical History  Diagnosis Date  . Hypertension   . Hypothyroidism   . GERD (gastroesophageal reflux disease)   . Hypercholesterolemia   . Ulcer disease   . Diverticulosis   . Anemia     iron deficient  . B12 deficiency   . Fibrocystic breast disease     Current Outpatient Prescriptions on File Prior to Visit  Medication Sig Dispense Refill  . aspirin 81 MG tablet Take 81 mg by mouth daily.      . Calcium Citrate (CITRACAL PO) Take 1,200 mg by mouth daily.      . Cholecalciferol (D3-1000 PO) Take by mouth daily.      . cyanocobalamin (,VITAMIN B-12,) 1000 MCG/ML injection Inject 1,000 mcg into the muscle every 30 (thirty) days.      . FORTICAL 200 UNIT/ACT nasal spray SPRAY ONE (1) SPRAY IN ONE NOSTRIL DAILY, ALTERNATE NOSTRILS  3.7 mL  5  .  Glucosamine-Chondroitin 1500-1200 MG/30ML LIQD Take by mouth 2 (two) times daily.      Marland Kitchen levothyroxine (SYNTHROID) 75 MCG tablet Take 1 tablet (75 mcg total) by mouth daily.  90 tablet  3  . Mesalamine (DELZICOL) 400 MG CPDR Take 400 mg by mouth 3 (three) times daily.      . pantoprazole (PROTONIX) 40 MG tablet Take 40 mg by mouth daily.       . Probiotic Product (ALIGN) 4 MG CAPS Take 1 capsule by mouth daily.       No current facility-administered medications on file prior to visit.    Review of Systems Patient denies any headache, lightheadedness or dizziness.  No sinus or allergy symptoms.   No chest pain, tightness or palpitations.  No increased shortness of breath, cough or congestion.  No nausea or vomiting.  Upper symptoms controlled.   No abdominal pain or cramping.  No BRBPR or melana.  Persistent flares of diarrhea as outlined above.  No urine change.   Uses ambien to help her sleep.  Knee did well after Synvisc injection.   Has gastroparesis.  Trying to adjust her diet.         Objective:   Physical Exam  Filed Vitals:  03/08/14 1041  BP: 132/72  Pulse: 55  Temp: 98 F (36.7 C)   Blood pressure recheck:  132/72, pulse 20  80 year old female in no acute distress.   HEENT:  Nares- clear.  Oropharynx - without lesions. NECK:  Supple.  Nontender.  No audible bruit.  HEART:  Appears to be regular. LUNGS:  No crackles or wheezing audible.  Respirations even and unlabored.  RADIAL PULSE:  Equal bilaterally. ABDOMEN:  Soft, nontender.  Bowel sounds present and normal.  No audible abdominal bruit.    EXTREMITIES:  No increased edema present.  DP pulses palpable and equal bilaterally.   MSK:  No pain to palpation over her lower legs.         Assessment & Plan:  MSK.  Knee did well after Synvisc injection.  Follow.   Exercise helps leg pain.  Follow.    GYN.  Has had no further bleeding or spotting.  Pap 01/11/11 negative.  Has seen Dr Amalia Hailey.  Had a large cervical polyp  removed.  Follow up pap 02/26/13 normal.    HEALTH MAINTENANCE.  Physical 02/26/13.  Pap as outlined.  Colonoscopy 12/05/12 revealed diverticulosis in the sigmoid colon, descending colon, transverse colon and ascending colon.  Exam otherwise normal.  Bone density 01/15/11 revealed osteopenia (decreased from previous).  F/u bone density in 3/14 - improved.  Received Reclast 05/24/11.  Mammogram 03/17/13 - Birads II.

## 2014-03-08 NOTE — Assessment & Plan Note (Signed)
Is iron deficient and B12 deficient.  Follow cbc/ferritin.    

## 2014-03-08 NOTE — Assessment & Plan Note (Signed)
On Protonix and carafate now.  EGD 12/05/12 - revealed esophageal changes secondary to Barrett's, hiatal hernia, gastritis and ulceration.  Had repeat EGD 02/05/13. Ulcer had healed per pt. Currently upper symptoms controlled.  Follow.   

## 2014-03-09 ENCOUNTER — Encounter: Payer: Self-pay | Admitting: Emergency Medicine

## 2014-03-09 LAB — VITAMIN D 25 HYDROXY (VIT D DEFICIENCY, FRACTURES): Vit D, 25-Hydroxy: 65 ng/mL (ref 30–89)

## 2014-03-10 ENCOUNTER — Encounter: Payer: Self-pay | Admitting: Internal Medicine

## 2014-03-17 DIAGNOSIS — K5289 Other specified noninfective gastroenteritis and colitis: Secondary | ICD-10-CM | POA: Diagnosis not present

## 2014-03-17 DIAGNOSIS — K3184 Gastroparesis: Secondary | ICD-10-CM | POA: Diagnosis not present

## 2014-03-25 ENCOUNTER — Ambulatory Visit (INDEPENDENT_AMBULATORY_CARE_PROVIDER_SITE_OTHER): Payer: Medicare Other | Admitting: *Deleted

## 2014-03-25 DIAGNOSIS — E538 Deficiency of other specified B group vitamins: Secondary | ICD-10-CM | POA: Diagnosis not present

## 2014-03-25 MED ORDER — CYANOCOBALAMIN 1000 MCG/ML IJ SOLN
1000.0000 ug | Freq: Once | INTRAMUSCULAR | Status: AC
  Administered 2014-03-25: 1000 ug via INTRAMUSCULAR

## 2014-03-29 ENCOUNTER — Ambulatory Visit: Payer: Self-pay | Admitting: Internal Medicine

## 2014-03-29 DIAGNOSIS — Z1231 Encounter for screening mammogram for malignant neoplasm of breast: Secondary | ICD-10-CM | POA: Diagnosis not present

## 2014-03-29 LAB — HM MAMMOGRAPHY: HM MAMMO: NEGATIVE

## 2014-03-30 ENCOUNTER — Encounter: Payer: Self-pay | Admitting: Internal Medicine

## 2014-04-26 ENCOUNTER — Ambulatory Visit (INDEPENDENT_AMBULATORY_CARE_PROVIDER_SITE_OTHER): Payer: Medicare Other | Admitting: *Deleted

## 2014-04-26 ENCOUNTER — Other Ambulatory Visit: Payer: Self-pay | Admitting: Internal Medicine

## 2014-04-26 DIAGNOSIS — E538 Deficiency of other specified B group vitamins: Secondary | ICD-10-CM | POA: Diagnosis not present

## 2014-04-26 MED ORDER — ZOLPIDEM TARTRATE ER 6.25 MG PO TBCR: 6.2500 mg | EXTENDED_RELEASE_TABLET | Freq: Every evening | ORAL | Status: DC | PRN

## 2014-04-26 MED ORDER — CYANOCOBALAMIN 1000 MCG/ML IJ SOLN
1000.0000 ug | Freq: Once | INTRAMUSCULAR | Status: AC
  Administered 2014-04-26: 1000 ug via INTRAMUSCULAR

## 2014-04-26 MED ORDER — CALCITONIN (SALMON) 200 UNIT/ACT NA SOLN: NASAL | Status: DC

## 2014-04-26 NOTE — Telephone Encounter (Signed)
Refilled fortical x 6 months.  Refilled ambien #30 with one refill.

## 2014-04-26 NOTE — Telephone Encounter (Signed)
Pt came in to office asking for refills on 3.7 fortical 200 IU and Zolpidem Tartrate ER 6.25 mg.  [place]

## 2014-04-26 NOTE — Telephone Encounter (Signed)
Ok refill? 

## 2014-04-26 NOTE — Progress Notes (Signed)
Patient tolerated well.

## 2014-04-27 NOTE — Telephone Encounter (Signed)
Rx faxed to pharmacy  

## 2014-05-27 ENCOUNTER — Ambulatory Visit (INDEPENDENT_AMBULATORY_CARE_PROVIDER_SITE_OTHER): Payer: Medicare Other | Admitting: *Deleted

## 2014-05-27 DIAGNOSIS — E538 Deficiency of other specified B group vitamins: Secondary | ICD-10-CM | POA: Diagnosis not present

## 2014-05-27 MED ORDER — CYANOCOBALAMIN 1000 MCG/ML IJ SOLN
1000.0000 ug | Freq: Once | INTRAMUSCULAR | Status: AC
  Administered 2014-05-27: 1000 ug via INTRAMUSCULAR

## 2014-06-08 ENCOUNTER — Ambulatory Visit (INDEPENDENT_AMBULATORY_CARE_PROVIDER_SITE_OTHER): Payer: Medicare Other | Admitting: Internal Medicine

## 2014-06-08 ENCOUNTER — Encounter: Payer: Self-pay | Admitting: Internal Medicine

## 2014-06-08 VITALS — BP 122/70 | HR 59 | Temp 98.0°F | Ht 60.0 in | Wt 176.8 lb

## 2014-06-08 DIAGNOSIS — I10 Essential (primary) hypertension: Secondary | ICD-10-CM | POA: Diagnosis not present

## 2014-06-08 DIAGNOSIS — M79609 Pain in unspecified limb: Secondary | ICD-10-CM | POA: Diagnosis not present

## 2014-06-08 DIAGNOSIS — M79606 Pain in leg, unspecified: Secondary | ICD-10-CM

## 2014-06-08 DIAGNOSIS — N289 Disorder of kidney and ureter, unspecified: Secondary | ICD-10-CM | POA: Diagnosis not present

## 2014-06-08 NOTE — Patient Instructions (Signed)
Gentle stretches.  Tylenol as we discussed.  Let me know if does not continue to improve.

## 2014-06-08 NOTE — Progress Notes (Signed)
Pre visit review using our clinic review tool, if applicable. No additional management support is needed unless otherwise documented below in the visit note. 

## 2014-06-13 ENCOUNTER — Encounter: Payer: Self-pay | Admitting: Internal Medicine

## 2014-06-13 DIAGNOSIS — R252 Cramp and spasm: Secondary | ICD-10-CM | POA: Insufficient documentation

## 2014-06-13 NOTE — Progress Notes (Signed)
Subjective:    Patient ID: Lori [name], female    DOB: 10-Dec-1934, 80 y.o.   MRN: [id number]  Leg Pain   80 year old female with past history of hypertension, hiatal hernia, reflux disease and iron and B12 deficiency who comes in today as a work in with concerns regarding right lower leg pain.  She states she has been doing relatively well.  Had an EGD 9/13.  On protonix.   Upper symptoms controlled.  Had follow up EGD 02/05/13.  Ulcer "gone" per pt.  Followed up with Dr Gustavo Lah.  Diagnosed with gastroparesis.  Still having flares of intermittent diarrhea.  Monitoring her diet closely.  Is better.  Also trying a low fiber diet.  States she noticed increased pain in her right lower inner thigh.  Described as a throbbing pain.  Limits her walking.  No known injury or trauma.  No swelling or redness.  Has been going on now for a few weeks.  Thought at first, she just pulled a muscle.  Is better today.  No rash.     Past Medical History  Diagnosis Date  . Hypertension   . Hypothyroidism   . GERD (gastroesophageal reflux disease)   . Hypercholesterolemia   . Ulcer disease   . Diverticulosis   . Anemia     iron deficient  . B12 deficiency   . Fibrocystic breast disease     Current Outpatient Prescriptions on File Prior to Visit  Medication Sig Dispense Refill  . aspirin 81 MG tablet Take 81 mg by mouth daily.      Marland Kitchen atenolol (TENORMIN) 25 MG tablet Take 1 tablet (25 mg total) by mouth daily.  30 tablet  6  . benazepril (LOTENSIN) 40 MG tablet TAKE ONE (1) TABLET BY MOUTH EVERY DAY  30 tablet  6  . calcitonin, salmon, (FORTICAL) 200 UNIT/ACT nasal spray SPRAY ONE (1) SPRAY IN ONE NOSTRIL DAILY, ALTERNATE NOSTRILS  3.7 mL  5  . Calcium Citrate (CITRACAL PO) Take 1,200 mg by mouth daily.      . Cholecalciferol (D3-1000 PO) Take by mouth daily.      . cyanocobalamin (,VITAMIN B-12,) 1000 MCG/ML injection Inject 1,000 mcg into the muscle every 30 (thirty) days.      Marland Kitchen levothyroxine (SYNTHROID)  75 MCG tablet Take 1 tablet (75 mcg total) by mouth daily.  90 tablet  3  . Mesalamine (DELZICOL) 400 MG CPDR Take 400 mg by mouth 3 (three) times daily.      . pantoprazole (PROTONIX) 40 MG tablet Take 40 mg by mouth daily.       . Probiotic Product (ALIGN) 4 MG CAPS Take 1 capsule by mouth daily.      . Wheat Dextrin (BENEFIBER PO) Take by mouth.      . zolpidem (AMBIEN CR) 6.25 MG CR tablet Take 1 tablet (6.25 mg total) by mouth at bedtime as needed for sleep.  30 tablet  1   No current facility-administered medications on file prior to visit.    Review of Systems No nausea or vomiting.  Upper symptoms controlled.   No abdominal pain or cramping.  No BRBPR or melana.  Persistent flares of diarrhea as outlined above.  Knee did well after Synvisc injection.  Right lower inner thigh pain as outlined.  No known injury or trauma.  Better today.          Objective:   Physical Exam  Filed Vitals:   06/08/14 0814  BP: 122/70  Pulse: 59  Temp: 98 F (35.58 C)   80 year old female in no acute distress.  HEART:  Appears to be regular. LUNGS:  No crackles or wheezing audible.  Respirations even and unlabored.  RADIAL PULSE:  Equal bilaterally. ABDOMEN:  Soft, nontender.  Bowel sounds present and normal.  No audible abdominal bruit.    EXTREMITIES:  No increased edema present.  DP pulses palpable and equal bilaterally.   MSK:  No pain to palpation over her lower legs.         Assessment & Plan:  MSK.  Knee did well after Synvisc injection.  Follow.    HEALTH MAINTENANCE.  Physical 02/26/13.  Pap as outlined.  Colonoscopy 12/05/12 revealed diverticulosis in the sigmoid colon, descending colon, transverse colon and ascending colon.  Exam otherwise normal.  Bone density 01/15/11 revealed osteopenia (decreased from previous).  F/u bone density in 3/14 - improved.  Received Reclast 05/24/11.  Mammogram 03/29/14 - Birads II.

## 2014-06-13 NOTE — Assessment & Plan Note (Signed)
No significant reproducible pain on exam.  Is better today.  Discussed stretches.  Avoid antiinflammatories.  Since better, hold on further w/up at this time.  Follow.  Notify me if problems.

## 2014-06-13 NOTE — Assessment & Plan Note (Signed)
Blood pressure under good control.  Same meds.  Follow.  Follow metabolic panel.

## 2014-06-13 NOTE — Assessment & Plan Note (Signed)
U/A negative for protein.  Renal ultrasound negative.  SIEP and UPEP negative.  Calcium and ionized calcium elevated.  Was referred to [facility] nephrology.   Last calcium wnl.  Follow.  Cr 03/08/14 - 1.1.  Avoid antiinflammatories.  Follow.

## 2014-06-24 DIAGNOSIS — H2510 Age-related nuclear cataract, unspecified eye: Secondary | ICD-10-CM | POA: Diagnosis not present

## 2014-06-28 ENCOUNTER — Ambulatory Visit (INDEPENDENT_AMBULATORY_CARE_PROVIDER_SITE_OTHER): Payer: Medicare Other | Admitting: *Deleted

## 2014-06-28 DIAGNOSIS — E538 Deficiency of other specified B group vitamins: Secondary | ICD-10-CM

## 2014-06-28 MED ORDER — CYANOCOBALAMIN 1000 MCG/ML IJ SOLN
1000.0000 ug | Freq: Once | INTRAMUSCULAR | Status: AC
  Administered 2014-06-28: 1000 ug via INTRAMUSCULAR

## 2014-07-12 ENCOUNTER — Ambulatory Visit (INDEPENDENT_AMBULATORY_CARE_PROVIDER_SITE_OTHER): Payer: Medicare Other | Admitting: Internal Medicine

## 2014-07-12 ENCOUNTER — Encounter: Payer: Self-pay | Admitting: Internal Medicine

## 2014-07-12 VITALS — BP 110/70 | HR 54 | Temp 98.0°F | Ht 60.0 in | Wt 175.8 lb

## 2014-07-12 DIAGNOSIS — E7800 Pure hypercholesterolemia, unspecified: Secondary | ICD-10-CM

## 2014-07-12 DIAGNOSIS — R252 Cramp and spasm: Secondary | ICD-10-CM

## 2014-07-12 DIAGNOSIS — E039 Hypothyroidism, unspecified: Secondary | ICD-10-CM

## 2014-07-12 DIAGNOSIS — K209 Esophagitis, unspecified without bleeding: Secondary | ICD-10-CM

## 2014-07-12 DIAGNOSIS — D649 Anemia, unspecified: Secondary | ICD-10-CM

## 2014-07-12 DIAGNOSIS — R17 Unspecified jaundice: Secondary | ICD-10-CM | POA: Diagnosis not present

## 2014-07-12 DIAGNOSIS — E538 Deficiency of other specified B group vitamins: Secondary | ICD-10-CM

## 2014-07-12 DIAGNOSIS — M8580 Other specified disorders of bone density and structure, unspecified site: Secondary | ICD-10-CM

## 2014-07-12 DIAGNOSIS — K2270 Barrett's esophagus without dysplasia: Secondary | ICD-10-CM

## 2014-07-12 DIAGNOSIS — M899 Disorder of bone, unspecified: Secondary | ICD-10-CM

## 2014-07-12 DIAGNOSIS — I10 Essential (primary) hypertension: Secondary | ICD-10-CM

## 2014-07-12 DIAGNOSIS — M949 Disorder of cartilage, unspecified: Secondary | ICD-10-CM

## 2014-07-12 DIAGNOSIS — E669 Obesity, unspecified: Secondary | ICD-10-CM

## 2014-07-12 DIAGNOSIS — N289 Disorder of kidney and ureter, unspecified: Secondary | ICD-10-CM

## 2014-07-12 DIAGNOSIS — K3184 Gastroparesis: Secondary | ICD-10-CM

## 2014-07-12 LAB — CBC WITH DIFFERENTIAL/PLATELET
BASOS ABS: 0 10*3/uL (ref 0.0–0.1)
Basophils Relative: 0.4 % (ref 0.0–3.0)
Eosinophils Absolute: 0.1 10*3/uL (ref 0.0–0.7)
Eosinophils Relative: 1.6 % (ref 0.0–5.0)
HEMATOCRIT: 35.4 % — AB (ref 36.0–46.0)
Hemoglobin: 11.7 g/dL — ABNORMAL LOW (ref 12.0–15.0)
LYMPHS ABS: 1 10*3/uL (ref 0.7–4.0)
Lymphocytes Relative: 21.5 % (ref 12.0–46.0)
MCHC: 33.2 g/dL (ref 30.0–36.0)
MCV: 88.8 fl (ref 78.0–100.0)
MONO ABS: 0.6 10*3/uL (ref 0.1–1.0)
MONOS PCT: 13.4 % — AB (ref 3.0–12.0)
NEUTROS PCT: 63.1 % (ref 43.0–77.0)
Neutro Abs: 2.8 10*3/uL (ref 1.4–7.7)
PLATELETS: 219 10*3/uL (ref 150.0–400.0)
RBC: 3.98 Mil/uL (ref 3.87–5.11)
RDW: 14.4 % (ref 11.5–15.5)
WBC: 4.4 10*3/uL (ref 4.0–10.5)

## 2014-07-12 MED ORDER — ZOLPIDEM TARTRATE ER 6.25 MG PO TBCR: 6.2500 mg | EXTENDED_RELEASE_TABLET | Freq: Every evening | ORAL | Status: DC | PRN

## 2014-07-12 NOTE — Progress Notes (Signed)
Pre visit review using our clinic review tool, if applicable. No additional management support is needed unless otherwise documented below in the visit note. 

## 2014-07-13 LAB — BASIC METABOLIC PANEL
BUN: 23 mg/dL (ref 6–23)
CALCIUM: 9.5 mg/dL (ref 8.4–10.5)
CO2: 28 mEq/L (ref 19–32)
CREATININE: 1 mg/dL (ref 0.4–1.2)
Chloride: 106 mEq/L (ref 96–112)
GFR: 57.03 mL/min — ABNORMAL LOW (ref >60.00)
Glucose, Bld: 96 mg/dL (ref 70–99)
Potassium: 4.3 mEq/L (ref 3.5–5.1)
Sodium: 139 mEq/L (ref 135–145)

## 2014-07-13 LAB — LIPID PANEL
Cholesterol: 179 mg/dL (ref 0–200)
HDL: 44.2 mg/dL (ref >39.00)
LDL Cholesterol: 110 mg/dL — ABNORMAL HIGH (ref 0–99)
NonHDL: 134.8
Total CHOL/HDL Ratio: 4
Triglycerides: 123 mg/dL (ref 0.0–149.0)
VLDL: 24.6 mg/dL (ref 0.0–40.0)

## 2014-07-13 LAB — HEPATIC FUNCTION PANEL
ALBUMIN: 4 g/dL (ref 3.5–5.2)
ALT: 16 U/L (ref 0–35)
AST: 20 U/L (ref 0–37)
Alkaline Phosphatase: 37 U/L — ABNORMAL LOW (ref 39–117)
Bilirubin, Direct: 0.2 mg/dL (ref 0.0–0.3)
Total Bilirubin: 1.2 mg/dL (ref 0.2–1.2)
Total Protein: 7.5 g/dL (ref 6.0–8.3)

## 2014-07-13 LAB — FERRITIN: FERRITIN: 48.9 ng/mL (ref 10.0–291.0)

## 2014-07-13 LAB — MAGNESIUM: Magnesium: 1.6 mg/dL (ref 1.5–2.5)

## 2014-07-16 ENCOUNTER — Encounter: Payer: Self-pay | Admitting: Internal Medicine

## 2014-07-16 ENCOUNTER — Telehealth: Payer: Self-pay | Admitting: Internal Medicine

## 2014-07-16 DIAGNOSIS — D649 Anemia, unspecified: Secondary | ICD-10-CM

## 2014-07-16 MED ORDER — MAGNESIUM OXIDE 400 MG PO TABS: 400.0000 mg | ORAL_TABLET | Freq: Every day | ORAL | Status: DC

## 2014-07-16 NOTE — Telephone Encounter (Signed)
Pt notified of lab results and need for non fasting lab appt within a few weeks.  Please schedule and contact her with an appt date and time.  Thanks.    Dr Nicki Reaper

## 2014-07-17 ENCOUNTER — Encounter: Payer: Self-pay | Admitting: Internal Medicine

## 2014-07-17 DIAGNOSIS — E669 Obesity, unspecified: Secondary | ICD-10-CM | POA: Insufficient documentation

## 2014-07-17 NOTE — Assessment & Plan Note (Signed)
Described leg cramps.  Recently flared.  No new activities.  Staying hydrated.  Discussed stretches.  Check calcium, potassium, and magnesium.  Further w/up and treatment pending.

## 2014-07-17 NOTE — Assessment & Plan Note (Signed)
Currently stable.  Follow.   

## 2014-07-17 NOTE — Assessment & Plan Note (Signed)
Received reclast.  Last bone density 3/14 - improved.  Follow.

## 2014-07-17 NOTE — Assessment & Plan Note (Signed)
U/A negative for protein.  Renal ultrasound negative.  SIEP and UPEP negative.  Calcium and ionized calcium elevated.  Was referred to [facility] nephrology.   Last calcium wnl.  Follow.  Cr has been stable.  Follow.

## 2014-07-17 NOTE — Assessment & Plan Note (Signed)
On Protonix and carafate now.  EGD 12/05/12 - revealed esophageal changes secondary to Barrett's, hiatal hernia, gastritis and ulceration.  Had repeat EGD 02/05/13. Ulcer had healed per pt. Currently upper symptoms controlled.  Follow.

## 2014-07-17 NOTE — Assessment & Plan Note (Signed)
Last bilirubin level wnl.

## 2014-07-17 NOTE — Assessment & Plan Note (Signed)
Blood pressure under good control.  Same meds.  Follow.  Follow metabolic panel.

## 2014-07-17 NOTE — Assessment & Plan Note (Signed)
On Synthroid.  Follow tsh.

## 2014-07-17 NOTE — Assessment & Plan Note (Signed)
Is iron deficient and B12 deficient.  Follow cbc/ferritin.

## 2014-07-17 NOTE — Assessment & Plan Note (Signed)
Continue B12 injections.   

## 2014-07-17 NOTE — Assessment & Plan Note (Signed)
Diet, exercise and weight loss.  

## 2014-07-17 NOTE — Progress Notes (Signed)
Subjective:    Patient ID: Lori [name], female    DOB: 01-06-1934, 80 y.o.   MRN: [id number]  HPI 80 year old female with past history of hypertension, hiatal hernia, reflux disease and iron and B12 deficiency who comes in today for a scheduled follow up.  She states she has been doing relatively well.  Had an EGD 9/13.  On protonix.   Upper symptoms controlled.  Had follow up EGD 02/05/13.  Ulcer "gone" per pt.  Followed up with Dr Gustavo Lah.  Diagnosed with gastroparesis.   Monitoring her diet closely.  Bowels are better.  Reports breathing is stable.  No cardiac symptoms with increased activity or exertion.  No blood in the stool.  S/p  synvisc injection.  Knee is better.  She is having problems with leg and hand cramps.  Increased recently.  States staying hydrated.  Previously took chlorazoxazone to help with cramps.  No new activity.   Still needing the ambien to help her sleep.  Blood pressure has been doing well.   Past Medical History  Diagnosis Date  . Hypertension   . Hypothyroidism   . GERD (gastroesophageal reflux disease)   . Hypercholesterolemia   . Ulcer disease   . Diverticulosis   . Anemia     iron deficient  . B12 deficiency   . Fibrocystic breast disease     Current Outpatient Prescriptions on File Prior to Visit  Medication Sig Dispense Refill  . aspirin 81 MG tablet Take 81 mg by mouth daily.      Marland Kitchen atenolol (TENORMIN) 25 MG tablet Take 1 tablet (25 mg total) by mouth daily.  30 tablet  6  . benazepril (LOTENSIN) 40 MG tablet TAKE ONE (1) TABLET BY MOUTH EVERY DAY  30 tablet  6  . calcitonin, salmon, (FORTICAL) 200 UNIT/ACT nasal spray SPRAY ONE (1) SPRAY IN ONE NOSTRIL DAILY, ALTERNATE NOSTRILS  3.7 mL  5  . Calcium Citrate (CITRACAL PO) Take 1,200 mg by mouth daily.      . Cholecalciferol (D3-1000 PO) Take by mouth daily.      . cyanocobalamin (,VITAMIN B-12,) 1000 MCG/ML injection Inject 1,000 mcg into the muscle every 30 (thirty) days.      Marland Kitchen  GLUCOSAMINE-CHONDROITIN PO Take 2 tablets by mouth daily.      Marland Kitchen levothyroxine (SYNTHROID) 75 MCG tablet Take 1 tablet (75 mcg total) by mouth daily.  90 tablet  3  . Mesalamine (DELZICOL) 400 MG CPDR Take 400 mg by mouth 3 (three) times daily.      . pantoprazole (PROTONIX) 40 MG tablet Take 40 mg by mouth daily.       . Probiotic Product (ALIGN) 4 MG CAPS Take 1 capsule by mouth daily.      . Wheat Dextrin (BENEFIBER PO) Take by mouth.       No current facility-administered medications on file prior to visit.    Review of Systems Patient denies any headache, lightheadedness or dizziness.  No sinus or allergy symptoms.   No chest pain, tightness or palpitations.  No increased shortness of breath, cough or congestion.  No nausea or vomiting.  Upper symptoms controlled.   No abdominal pain or cramping.  No BRBPR or melana.  Persistent flares of diarrhea, but overall much better. No urine change.   Uses ambien to help her sleep.  Knee did well after Synvisc injection.   Has gastroparesis.  Does report cramping in legs and hands.  Objective:   Physical Exam  Filed Vitals:   07/12/14 1417  BP: 110/70  Pulse: 54  Temp: 98 F (36.7 C)   Blood pressure recheck:  50/69  80 year old female in no acute distress.   HEENT:  Nares- clear.  Oropharynx - without lesions. NECK:  Supple.  Nontender.  No audible bruit.  HEART:  Appears to be regular. LUNGS:  No crackles or wheezing audible.  Respirations even and unlabored.  RADIAL PULSE:  Equal bilaterally. ABDOMEN:  Soft, nontender.  Bowel sounds present and normal.  No audible abdominal bruit.    EXTREMITIES:  No increased edema present.  DP pulses palpable and equal bilaterally.   MSK:  No pain to palpation over her lower legs.         Assessment & Plan:  MSK.  Knee did well after Synvisc injection.  Follow.    GYN.  Has had no further bleeding or spotting.  Pap 01/11/11 negative.  Has seen Dr Amalia Hailey.  Had a large cervical polyp  removed.  Follow up pap 02/26/13 normal.    HEALTH MAINTENANCE.  Physical 03/08/14.  Pap as outlined.  Colonoscopy 12/05/12 revealed diverticulosis in the sigmoid colon, descending colon, transverse colon and ascending colon.  Exam otherwise normal.  Bone density 01/15/11 revealed osteopenia (decreased from previous).  F/u bone density in 3/14 - improved.  Received Reclast 05/24/11.  Mammogram 03/29/14 - Birads I.

## 2014-07-29 ENCOUNTER — Encounter: Payer: Self-pay | Admitting: Internal Medicine

## 2014-07-29 ENCOUNTER — Ambulatory Visit (INDEPENDENT_AMBULATORY_CARE_PROVIDER_SITE_OTHER): Payer: Medicare Other | Admitting: *Deleted

## 2014-07-29 ENCOUNTER — Telehealth: Payer: Self-pay | Admitting: Internal Medicine

## 2014-07-29 ENCOUNTER — Other Ambulatory Visit (INDEPENDENT_AMBULATORY_CARE_PROVIDER_SITE_OTHER): Payer: Medicare Other

## 2014-07-29 DIAGNOSIS — E538 Deficiency of other specified B group vitamins: Secondary | ICD-10-CM

## 2014-07-29 DIAGNOSIS — D649 Anemia, unspecified: Secondary | ICD-10-CM | POA: Diagnosis not present

## 2014-07-29 LAB — HEMOGLOBIN: HEMOGLOBIN: 11.7 g/dL — AB (ref 12.0–15.0)

## 2014-07-29 MED ORDER — CYANOCOBALAMIN 1000 MCG/ML IJ SOLN
1000.0000 ug | Freq: Once | INTRAMUSCULAR | Status: AC
  Administered 2014-07-29: 1000 ug via INTRAMUSCULAR

## 2014-07-29 NOTE — Telephone Encounter (Signed)
Pt notified of lab results via my chart. Needs a non fasting lab in 6-8 weeks.  Please schedule and contact her with an appt date and time.  Thanks.

## 2014-07-30 ENCOUNTER — Encounter: Payer: Self-pay | Admitting: *Deleted

## 2014-07-30 NOTE — Telephone Encounter (Signed)
Unable to contact pt via phone, sent letter to pt advising lab appointment needed.

## 2014-08-31 ENCOUNTER — Other Ambulatory Visit (INDEPENDENT_AMBULATORY_CARE_PROVIDER_SITE_OTHER): Payer: Medicare Other

## 2014-08-31 ENCOUNTER — Ambulatory Visit (INDEPENDENT_AMBULATORY_CARE_PROVIDER_SITE_OTHER): Payer: Medicare Other | Admitting: *Deleted

## 2014-08-31 DIAGNOSIS — Z23 Encounter for immunization: Secondary | ICD-10-CM

## 2014-08-31 DIAGNOSIS — E538 Deficiency of other specified B group vitamins: Secondary | ICD-10-CM | POA: Diagnosis not present

## 2014-08-31 DIAGNOSIS — D649 Anemia, unspecified: Secondary | ICD-10-CM

## 2014-08-31 MED ORDER — CYANOCOBALAMIN 1000 MCG/ML IJ SOLN
1000.0000 ug | Freq: Once | INTRAMUSCULAR | Status: AC
  Administered 2014-08-31: 1000 ug via INTRAMUSCULAR

## 2014-08-31 MED ORDER — ZOLPIDEM TARTRATE ER 6.25 MG PO TBCR: 6.2500 mg | EXTENDED_RELEASE_TABLET | Freq: Every evening | ORAL | Status: DC | PRN

## 2014-09-01 ENCOUNTER — Encounter: Payer: Self-pay | Admitting: Internal Medicine

## 2014-09-01 ENCOUNTER — Other Ambulatory Visit: Payer: Self-pay | Admitting: Internal Medicine

## 2014-09-01 DIAGNOSIS — D649 Anemia, unspecified: Secondary | ICD-10-CM

## 2014-09-01 LAB — CBC WITH DIFFERENTIAL/PLATELET
Basophils Absolute: 0 10*3/uL (ref 0.0–0.1)
Basophils Relative: 0.4 % (ref 0.0–3.0)
Eosinophils Absolute: 0.1 10*3/uL (ref 0.0–0.7)
Eosinophils Relative: 2 % (ref 0.0–5.0)
HCT: 33.5 % — ABNORMAL LOW (ref 36.0–46.0)
Hemoglobin: 11.3 g/dL — ABNORMAL LOW (ref 12.0–15.0)
LYMPHS PCT: 22.2 % (ref 12.0–46.0)
Lymphs Abs: 0.7 10*3/uL (ref 0.7–4.0)
MCHC: 33.6 g/dL (ref 30.0–36.0)
MCV: 90.1 fl (ref 78.0–100.0)
MONOS PCT: 15 % — AB (ref 3.0–12.0)
Monocytes Absolute: 0.5 10*3/uL (ref 0.1–1.0)
NEUTROS PCT: 60.4 % (ref 43.0–77.0)
Neutro Abs: 1.9 10*3/uL (ref 1.4–7.7)
PLATELETS: 208 10*3/uL (ref 150.0–400.0)
RBC: 3.72 Mil/uL — ABNORMAL LOW (ref 3.87–5.11)
RDW: 13.5 % (ref 11.5–15.5)
WBC: 3.1 10*3/uL — AB (ref 4.0–10.5)

## 2014-09-01 NOTE — Progress Notes (Signed)
Order placed for f/u labs.  

## 2014-09-02 ENCOUNTER — Encounter: Payer: Self-pay | Admitting: *Deleted

## 2014-09-07 NOTE — Telephone Encounter (Signed)
Mailed unread message to pt  

## 2014-09-16 ENCOUNTER — Other Ambulatory Visit (INDEPENDENT_AMBULATORY_CARE_PROVIDER_SITE_OTHER): Payer: Medicare Other

## 2014-09-16 ENCOUNTER — Telehealth: Payer: Self-pay | Admitting: Internal Medicine

## 2014-09-16 DIAGNOSIS — D649 Anemia, unspecified: Secondary | ICD-10-CM | POA: Diagnosis not present

## 2014-09-16 LAB — CBC WITH DIFFERENTIAL/PLATELET
Basophils Absolute: 0 10*3/uL (ref 0.0–0.1)
Basophils Relative: 0.5 % (ref 0.0–3.0)
EOS ABS: 0.1 10*3/uL (ref 0.0–0.7)
Eosinophils Relative: 1.6 % (ref 0.0–5.0)
HEMATOCRIT: 32.7 % — AB (ref 36.0–46.0)
HEMOGLOBIN: 11 g/dL — AB (ref 12.0–15.0)
LYMPHS PCT: 18.3 % (ref 12.0–46.0)
Lymphs Abs: 0.7 10*3/uL (ref 0.7–4.0)
MCHC: 33.7 g/dL (ref 30.0–36.0)
MCV: 89.7 fl (ref 78.0–100.0)
MONOS PCT: 13.5 % — AB (ref 3.0–12.0)
Monocytes Absolute: 0.5 10*3/uL (ref 0.1–1.0)
NEUTROS ABS: 2.5 10*3/uL (ref 1.4–7.7)
Neutrophils Relative %: 66.1 % (ref 43.0–77.0)
Platelets: 188 10*3/uL (ref 150.0–400.0)
RBC: 3.65 Mil/uL — AB (ref 3.87–5.11)
RDW: 13.4 % (ref 11.5–15.5)
WBC: 3.8 10*3/uL — ABNORMAL LOW (ref 4.0–10.5)

## 2014-09-16 LAB — IBC PANEL
Iron: 56 ug/dL (ref 42–145)
SATURATION RATIOS: 17.3 % — AB (ref 20.0–50.0)
TRANSFERRIN: 231.3 mg/dL (ref 212.0–360.0)

## 2014-09-16 LAB — VITAMIN B12: Vitamin B-12: 947 pg/mL — ABNORMAL HIGH (ref 211–911)

## 2014-09-16 MED ORDER — ATENOLOL 25 MG PO TABS: 25.0000 mg | ORAL_TABLET | Freq: Every day | ORAL | Status: DC

## 2014-09-16 MED ORDER — BENAZEPRIL HCL 40 MG PO TABS: ORAL_TABLET | ORAL | Status: DC

## 2014-09-16 NOTE — Telephone Encounter (Signed)
Pt came in and is needing a refill on her Benazepril HCL 40 mg tab and Atenolol 25mg  tab. She stated her insurance is requesting 90 day refills whenever possible and her pharmacy is [facility].

## 2014-09-17 ENCOUNTER — Encounter: Payer: Self-pay | Admitting: Internal Medicine

## 2014-09-17 NOTE — Progress Notes (Signed)
Appoint scheduled

## 2014-09-20 ENCOUNTER — Encounter: Payer: Self-pay | Admitting: Internal Medicine

## 2014-09-20 NOTE — Telephone Encounter (Signed)
Unread mychart message & lab appt mailed to patient 

## 2014-10-05 ENCOUNTER — Ambulatory Visit (INDEPENDENT_AMBULATORY_CARE_PROVIDER_SITE_OTHER): Payer: Medicare Other | Admitting: *Deleted

## 2014-10-05 DIAGNOSIS — E538 Deficiency of other specified B group vitamins: Secondary | ICD-10-CM

## 2014-10-05 MED ORDER — CYANOCOBALAMIN 1000 MCG/ML IJ SOLN
1000.0000 ug | Freq: Once | INTRAMUSCULAR | Status: AC
  Administered 2014-10-05: 1000 ug via INTRAMUSCULAR

## 2014-10-08 ENCOUNTER — Telehealth: Payer: Self-pay | Admitting: *Deleted

## 2014-10-08 DIAGNOSIS — D649 Anemia, unspecified: Secondary | ICD-10-CM

## 2014-10-08 NOTE — Telephone Encounter (Signed)
Pt is coming in Monday what labs and dx? 

## 2014-10-10 NOTE — Telephone Encounter (Signed)
Order placed for f/u cbc.   

## 2014-10-11 ENCOUNTER — Other Ambulatory Visit (INDEPENDENT_AMBULATORY_CARE_PROVIDER_SITE_OTHER): Payer: Medicare Other

## 2014-10-11 DIAGNOSIS — D649 Anemia, unspecified: Secondary | ICD-10-CM | POA: Diagnosis not present

## 2014-10-11 LAB — CBC WITH DIFFERENTIAL/PLATELET
BASOS PCT: 0.8 % (ref 0.0–3.0)
Basophils Absolute: 0 10*3/uL (ref 0.0–0.1)
Eosinophils Absolute: 0.2 10*3/uL (ref 0.0–0.7)
Eosinophils Relative: 3.9 % (ref 0.0–5.0)
HCT: 35.6 % — ABNORMAL LOW (ref 36.0–46.0)
HEMOGLOBIN: 11.5 g/dL — AB (ref 12.0–15.0)
LYMPHS PCT: 19.3 % (ref 12.0–46.0)
Lymphs Abs: 0.8 10*3/uL (ref 0.7–4.0)
MCHC: 32.2 g/dL (ref 30.0–36.0)
MCV: 90.3 fl (ref 78.0–100.0)
MONOS PCT: 12.8 % — AB (ref 3.0–12.0)
Monocytes Absolute: 0.5 10*3/uL (ref 0.1–1.0)
NEUTROS ABS: 2.5 10*3/uL (ref 1.4–7.7)
Neutrophils Relative %: 63.2 % (ref 43.0–77.0)
Platelets: 247 10*3/uL (ref 150.0–400.0)
RBC: 3.95 Mil/uL (ref 3.87–5.11)
RDW: 13.8 % (ref 11.5–15.5)
WBC: 4 10*3/uL (ref 4.0–10.5)

## 2014-10-12 ENCOUNTER — Encounter: Payer: Self-pay | Admitting: Internal Medicine

## 2014-10-18 ENCOUNTER — Other Ambulatory Visit: Payer: No Typology Code available for payment source

## 2014-10-25 ENCOUNTER — Other Ambulatory Visit: Payer: Self-pay | Admitting: Internal Medicine

## 2014-11-09 ENCOUNTER — Ambulatory Visit (INDEPENDENT_AMBULATORY_CARE_PROVIDER_SITE_OTHER): Payer: Medicare Other | Admitting: *Deleted

## 2014-11-09 DIAGNOSIS — E538 Deficiency of other specified B group vitamins: Secondary | ICD-10-CM | POA: Diagnosis not present

## 2014-11-09 MED ORDER — CYANOCOBALAMIN 1000 MCG/ML IJ SOLN
1000.0000 ug | Freq: Once | INTRAMUSCULAR | Status: AC
  Administered 2014-11-09: 1000 ug via INTRAMUSCULAR

## 2014-11-15 ENCOUNTER — Telehealth: Payer: Self-pay | Admitting: Internal Medicine

## 2014-11-15 ENCOUNTER — Encounter: Payer: Self-pay | Admitting: Internal Medicine

## 2014-11-15 ENCOUNTER — Ambulatory Visit (INDEPENDENT_AMBULATORY_CARE_PROVIDER_SITE_OTHER): Payer: Medicare Other | Admitting: Internal Medicine

## 2014-11-15 VITALS — BP 106/67 | HR 60 | Temp 98.1°F | Ht 60.0 in | Wt 173.5 lb

## 2014-11-15 DIAGNOSIS — K2270 Barrett's esophagus without dysplasia: Secondary | ICD-10-CM | POA: Diagnosis not present

## 2014-11-15 DIAGNOSIS — E538 Deficiency of other specified B group vitamins: Secondary | ICD-10-CM | POA: Diagnosis not present

## 2014-11-15 DIAGNOSIS — E7800 Pure hypercholesterolemia, unspecified: Secondary | ICD-10-CM

## 2014-11-15 DIAGNOSIS — R252 Cramp and spasm: Secondary | ICD-10-CM | POA: Diagnosis not present

## 2014-11-15 DIAGNOSIS — I10 Essential (primary) hypertension: Secondary | ICD-10-CM | POA: Diagnosis not present

## 2014-11-15 DIAGNOSIS — E039 Hypothyroidism, unspecified: Secondary | ICD-10-CM | POA: Diagnosis not present

## 2014-11-15 DIAGNOSIS — D649 Anemia, unspecified: Secondary | ICD-10-CM

## 2014-11-15 DIAGNOSIS — K3184 Gastroparesis: Secondary | ICD-10-CM

## 2014-11-15 DIAGNOSIS — K209 Esophagitis, unspecified: Secondary | ICD-10-CM

## 2014-11-15 DIAGNOSIS — N289 Disorder of kidney and ureter, unspecified: Secondary | ICD-10-CM | POA: Diagnosis not present

## 2014-11-15 MED ORDER — MAGNESIUM OXIDE 400 MG PO TABS: 400.0000 mg | ORAL_TABLET | Freq: Every day | ORAL | Status: DC

## 2014-11-15 MED ORDER — ZOLPIDEM TARTRATE ER 6.25 MG PO TBCR
6.2500 mg | EXTENDED_RELEASE_TABLET | Freq: Every evening | ORAL | Status: DC | PRN
Start: 2014-11-15 — End: 2014-12-29

## 2014-11-15 MED ORDER — LEVOTHYROXINE SODIUM 75 MCG PO TABS: 75.0000 ug | ORAL_TABLET | Freq: Every day | ORAL | Status: DC

## 2014-11-15 NOTE — Telephone Encounter (Signed)
Please schedule her for 12:00 on 12/29/14.  Thanks.

## 2014-11-15 NOTE — Progress Notes (Signed)
Pre visit review using our clinic review tool, if applicable. No additional management support is needed unless otherwise documented below in the visit note. 

## 2014-11-15 NOTE — Telephone Encounter (Signed)
Pt needed 4--6 week follow up  Next 30 min appointment is 01/20/15  Is this ok

## 2014-11-15 NOTE — Progress Notes (Signed)
Subjective:    Patient ID: Lori [name], female    DOB: [birth year], 80 y.o.   MRN: [id number]  HPI 80 year old female with past history of hypertension, hiatal hernia, reflux disease and iron and B12 deficiency who comes in today for a scheduled follow up.  She states she has been doing relatively well.  Had an EGD 9/13.  On protonix.   Upper symptoms controlled.  Had follow up EGD 02/05/13.  Ulcer "gone" per pt.  Followed up with Dr Gustavo Lah.  Diagnosed with gastroparesis.   Monitoring her diet closely.  Bowels are better.  Reports breathing is stable.  No cardiac symptoms with increased activity or exertion.  No blood in the stool.  Still needing the ambien to help her sleep.  Blood pressure has been doing well.  Main complaint is cramping in her hands.  Previously took magnesium.  Resolved.  Cramping restarted recently.  Back on magnesium prn now.  Helps.  Otherwise  feels doing well.     Past Medical History  Diagnosis Date  . Hypertension   . Hypothyroidism   . GERD (gastroesophageal reflux disease)   . Hypercholesterolemia   . Ulcer disease   . Diverticulosis   . Anemia     iron deficient  . B12 deficiency   . Fibrocystic breast disease     Current Outpatient Prescriptions on File Prior to Visit  Medication Sig Dispense Refill  . aspirin 81 MG tablet Take 81 mg by mouth daily.    Marland Kitchen atenolol (TENORMIN) 25 MG tablet Take 1 tablet (25 mg total) by mouth daily. 90 tablet 2  . benazepril (LOTENSIN) 40 MG tablet TAKE ONE (1) TABLET BY MOUTH EVERY DAY 90 tablet 2  . Calcium Citrate (CITRACAL PO) Take 1,200 mg by mouth daily.    . Cholecalciferol (D3-1000 PO) Take by mouth daily.    . cyanocobalamin (,VITAMIN B-12,) 1000 MCG/ML injection Inject 1,000 mcg into the muscle every 30 (thirty) days.    . FORTICAL 200 UNIT/ACT nasal spray USE 1 SPRAY IN ONE NOSTRIL ONCE DAILY ALTERNATING NOSTRILS 3.7 mL 2  . GLUCOSAMINE-CHONDROITIN PO Take 2 tablets by mouth daily.    . Mesalamine  (DELZICOL) 400 MG CPDR Take 400 mg by mouth 3 (three) times daily.    . pantoprazole (PROTONIX) 40 MG tablet Take 40 mg by mouth daily.     . Probiotic Product (ALIGN) 4 MG CAPS Take 1 capsule by mouth daily.    . Wheat Dextrin (BENEFIBER PO) Take by mouth.     No current facility-administered medications on file prior to visit.    Review of Systems Patient denies any headache, lightheadedness or dizziness.  No sinus or allergy symptoms.   No chest pain, tightness or palpitations.  No increased shortness of breath, cough or congestion.  No nausea or vomiting.  Upper symptoms controlled.   No abdominal pain or cramping.  No BRBPR or melana.  Persistent flares of diarrhea, but overall much better. No urine change.   Uses ambien to help her sleep.   Has gastroparesis.  Does report cramping in hands.         Objective:   Physical Exam  Filed Vitals:   11/15/14 1354  BP: 106/67  Pulse: 60  Temp: 98.1 F (36.7 C)   Blood pressure recheck:  42/65  80 year old female in no acute distress.   HEENT:  Nares- clear.  Oropharynx - without lesions. NECK:  Supple.  Nontender.  No audible  bruit.  HEART:  Appears to be regular. LUNGS:  No crackles or wheezing audible.  Respirations even and unlabored.  RADIAL PULSE:  Equal bilaterally. ABDOMEN:  Soft, nontender.  Bowel sounds present and normal.  No audible abdominal bruit.    EXTREMITIES:  No increased edema present.  DP pulses palpable and equal bilaterally.   MSK:  No pain to palpation over her lower legs.         Assessment & Plan:  GYN.  Has had no further bleeding or spotting.  Pap 01/11/11 negative.  Has seen Dr Amalia Hailey.  Had a large cervical polyp removed.  Follow up pap 02/26/13 normal.   Essential hypertension Blood pressure doing well. Same medication regimen.  Follow metabolic panel.    Gastroparesis Currently stable.    Barrett's esophagus with esophagitis Symptoms controlled on protonix.  Follow.    B12 deficiency Continue  B12 injections.    Hypothyroidism, unspecified hypothyroidism type On thyroid replacement.  Follow tsh.    Renal insufficiency Stable.  Follow renal function.   Anemia, unspecified anemia type Is iron deficient and B12 deficient.  Follow cbc/ferritin.   Hand cramps Restart magnesium.  Follow.  See back soon to reassess.    HEALTH MAINTENANCE.  Physical 03/08/14.  Pap as outlined.  Colonoscopy 12/05/12 revealed diverticulosis in the sigmoid colon, descending colon, transverse colon and ascending colon.  Exam otherwise normal.  Bone density 01/15/11 revealed osteopenia (decreased from previous).  F/u bone density in 3/14 - improved.  Received Reclast 05/24/11.  Mammogram 03/29/14 - Birads I.

## 2014-11-16 NOTE — Telephone Encounter (Signed)
[name] or [name] can you help with this Thanks Lori [name]

## 2014-11-17 NOTE — Telephone Encounter (Signed)
The patient has been scheduled

## 2014-11-23 ENCOUNTER — Other Ambulatory Visit (INDEPENDENT_AMBULATORY_CARE_PROVIDER_SITE_OTHER): Payer: Medicare Other

## 2014-11-23 DIAGNOSIS — I10 Essential (primary) hypertension: Secondary | ICD-10-CM | POA: Diagnosis not present

## 2014-11-23 DIAGNOSIS — D649 Anemia, unspecified: Secondary | ICD-10-CM | POA: Diagnosis not present

## 2014-11-23 DIAGNOSIS — E7800 Pure hypercholesterolemia, unspecified: Secondary | ICD-10-CM

## 2014-11-23 LAB — CBC WITH DIFFERENTIAL/PLATELET
BASOS PCT: 1.3 % (ref 0.0–3.0)
Basophils Absolute: 0.1 10*3/uL (ref 0.0–0.1)
EOS PCT: 2.9 % (ref 0.0–5.0)
Eosinophils Absolute: 0.1 10*3/uL (ref 0.0–0.7)
HEMATOCRIT: 34.5 % — AB (ref 36.0–46.0)
Hemoglobin: 11.4 g/dL — ABNORMAL LOW (ref 12.0–15.0)
LYMPHS ABS: 0.6 10*3/uL — AB (ref 0.7–4.0)
Lymphocytes Relative: 15.9 % (ref 12.0–46.0)
MCHC: 33 g/dL (ref 30.0–36.0)
MCV: 87.9 fl (ref 78.0–100.0)
MONO ABS: 0.5 10*3/uL (ref 0.1–1.0)
MONOS PCT: 11.1 % (ref 3.0–12.0)
Neutro Abs: 2.8 10*3/uL (ref 1.4–7.7)
Neutrophils Relative %: 68.8 % (ref 43.0–77.0)
Platelets: 250 10*3/uL (ref 150.0–400.0)
RBC: 3.92 Mil/uL (ref 3.87–5.11)
RDW: 15 % (ref 11.5–15.5)
WBC: 4.1 10*3/uL (ref 4.0–10.5)

## 2014-11-23 LAB — HEPATIC FUNCTION PANEL
ALT: 94 U/L — AB (ref 0–35)
AST: 144 U/L — ABNORMAL HIGH (ref 0–37)
Albumin: 2.7 g/dL — ABNORMAL LOW (ref 3.5–5.2)
Alkaline Phosphatase: 64 U/L (ref 39–117)
BILIRUBIN DIRECT: 0.1 mg/dL (ref 0.0–0.3)
BILIRUBIN TOTAL: 0.9 mg/dL (ref 0.2–1.2)
Total Protein: 8.3 g/dL (ref 6.0–8.3)

## 2014-11-23 LAB — LIPID PANEL
CHOL/HDL RATIO: 5
CHOLESTEROL: 114 mg/dL (ref 0–200)
HDL: 23.8 mg/dL — AB (ref >39.00)
LDL CALC: 71 mg/dL (ref 0–99)
NonHDL: 90.2
Triglycerides: 96 mg/dL (ref 0.0–149.0)
VLDL: 19.2 mg/dL (ref 0.0–40.0)

## 2014-11-23 LAB — BASIC METABOLIC PANEL
BUN: 24 mg/dL — AB (ref 6–23)
CALCIUM: 8.4 mg/dL (ref 8.4–10.5)
CO2: 23 mEq/L (ref 19–32)
CREATININE: 1.1 mg/dL (ref 0.4–1.2)
Chloride: 106 mEq/L (ref 96–112)
GFR: 49.99 mL/min — ABNORMAL LOW (ref >60.00)
Glucose, Bld: 91 mg/dL (ref 70–99)
Potassium: 4.3 mEq/L (ref 3.5–5.1)
Sodium: 135 mEq/L (ref 135–145)

## 2014-11-23 LAB — FERRITIN: Ferritin: 80 ng/mL (ref 10.0–291.0)

## 2014-11-24 ENCOUNTER — Encounter: Payer: Self-pay | Admitting: *Deleted

## 2014-11-24 ENCOUNTER — Other Ambulatory Visit: Payer: Self-pay | Admitting: Internal Medicine

## 2014-11-24 DIAGNOSIS — R7989 Other specified abnormal findings of blood chemistry: Secondary | ICD-10-CM

## 2014-11-24 DIAGNOSIS — R945 Abnormal results of liver function studies: Secondary | ICD-10-CM

## 2014-11-24 NOTE — Progress Notes (Signed)
Order placed for f/u lab.   

## 2014-11-29 ENCOUNTER — Other Ambulatory Visit (INDEPENDENT_AMBULATORY_CARE_PROVIDER_SITE_OTHER): Payer: Medicare Other

## 2014-11-29 DIAGNOSIS — R7989 Other specified abnormal findings of blood chemistry: Secondary | ICD-10-CM | POA: Diagnosis not present

## 2014-11-29 DIAGNOSIS — R945 Abnormal results of liver function studies: Secondary | ICD-10-CM

## 2014-11-29 LAB — HEPATIC FUNCTION PANEL
ALBUMIN: 2.7 g/dL — AB (ref 3.5–5.2)
ALT: 98 U/L — ABNORMAL HIGH (ref 0–35)
AST: 153 U/L — AB (ref 0–37)
Alkaline Phosphatase: 64 U/L (ref 39–117)
BILIRUBIN TOTAL: 0.7 mg/dL (ref 0.2–1.2)
Bilirubin, Direct: 0.1 mg/dL (ref 0.0–0.3)
Total Protein: 8.2 g/dL (ref 6.0–8.3)

## 2014-12-02 ENCOUNTER — Other Ambulatory Visit: Payer: Self-pay | Admitting: Internal Medicine

## 2014-12-02 ENCOUNTER — Encounter: Payer: Self-pay | Admitting: *Deleted

## 2014-12-02 DIAGNOSIS — R7989 Other specified abnormal findings of blood chemistry: Secondary | ICD-10-CM

## 2014-12-02 DIAGNOSIS — R945 Abnormal results of liver function studies: Secondary | ICD-10-CM

## 2014-12-02 DIAGNOSIS — R748 Abnormal levels of other serum enzymes: Secondary | ICD-10-CM

## 2014-12-02 NOTE — Progress Notes (Signed)
Order placed for abdominal ultrasound.   

## 2014-12-02 NOTE — Telephone Encounter (Signed)
Order placed for referral to GI 

## 2014-12-03 ENCOUNTER — Encounter: Payer: Self-pay | Admitting: Internal Medicine

## 2014-12-04 ENCOUNTER — Encounter: Payer: Self-pay | Admitting: Internal Medicine

## 2014-12-09 ENCOUNTER — Ambulatory Visit: Payer: Self-pay | Admitting: Internal Medicine

## 2014-12-09 DIAGNOSIS — K7689 Other specified diseases of liver: Secondary | ICD-10-CM | POA: Diagnosis not present

## 2014-12-09 DIAGNOSIS — R945 Abnormal results of liver function studies: Secondary | ICD-10-CM | POA: Diagnosis not present

## 2014-12-09 DIAGNOSIS — R748 Abnormal levels of other serum enzymes: Secondary | ICD-10-CM | POA: Diagnosis not present

## 2014-12-14 ENCOUNTER — Ambulatory Visit (INDEPENDENT_AMBULATORY_CARE_PROVIDER_SITE_OTHER): Payer: Medicare Other | Admitting: *Deleted

## 2014-12-14 ENCOUNTER — Telehealth: Payer: Self-pay | Admitting: Internal Medicine

## 2014-12-14 DIAGNOSIS — E538 Deficiency of other specified B group vitamins: Secondary | ICD-10-CM

## 2014-12-14 MED ORDER — CYANOCOBALAMIN 1000 MCG/ML IJ SOLN
1000.0000 ug | Freq: Once | INTRAMUSCULAR | Status: AC
  Administered 2014-12-14: 1000 ug via INTRAMUSCULAR

## 2014-12-14 NOTE — Telephone Encounter (Signed)
Pt notified of abdominal ultrasound results.

## 2014-12-29 ENCOUNTER — Encounter: Payer: Self-pay | Admitting: Internal Medicine

## 2014-12-29 ENCOUNTER — Ambulatory Visit (INDEPENDENT_AMBULATORY_CARE_PROVIDER_SITE_OTHER): Payer: Medicare Other | Admitting: Internal Medicine

## 2014-12-29 VITALS — BP 100/60 | HR 57 | Temp 97.7°F | Ht 60.0 in | Wt 166.2 lb

## 2014-12-29 DIAGNOSIS — D649 Anemia, unspecified: Secondary | ICD-10-CM | POA: Diagnosis not present

## 2014-12-29 DIAGNOSIS — E039 Hypothyroidism, unspecified: Secondary | ICD-10-CM | POA: Diagnosis not present

## 2014-12-29 DIAGNOSIS — E669 Obesity, unspecified: Secondary | ICD-10-CM | POA: Diagnosis not present

## 2014-12-29 DIAGNOSIS — K209 Esophagitis, unspecified: Secondary | ICD-10-CM

## 2014-12-29 DIAGNOSIS — R945 Abnormal results of liver function studies: Secondary | ICD-10-CM

## 2014-12-29 DIAGNOSIS — K2270 Barrett's esophagus without dysplasia: Secondary | ICD-10-CM

## 2014-12-29 DIAGNOSIS — R7989 Other specified abnormal findings of blood chemistry: Secondary | ICD-10-CM

## 2014-12-29 DIAGNOSIS — E538 Deficiency of other specified B group vitamins: Secondary | ICD-10-CM | POA: Diagnosis not present

## 2014-12-29 DIAGNOSIS — R252 Cramp and spasm: Secondary | ICD-10-CM

## 2014-12-29 DIAGNOSIS — N289 Disorder of kidney and ureter, unspecified: Secondary | ICD-10-CM

## 2014-12-29 DIAGNOSIS — I10 Essential (primary) hypertension: Secondary | ICD-10-CM | POA: Diagnosis not present

## 2014-12-29 MED ORDER — BENAZEPRIL HCL 20 MG PO TABS: 20.0000 mg | ORAL_TABLET | Freq: Every day | ORAL | Status: DC

## 2014-12-29 MED ORDER — ZOLPIDEM TARTRATE ER 6.25 MG PO TBCR: 6.2500 mg | EXTENDED_RELEASE_TABLET | Freq: Every evening | ORAL | Status: DC | PRN

## 2014-12-29 NOTE — Progress Notes (Signed)
Pre visit review using our clinic review tool, if applicable. No additional management support is needed unless otherwise documented below in the visit note. 

## 2014-12-30 LAB — HEPATIC FUNCTION PANEL
ALT: 66 U/L — ABNORMAL HIGH (ref 0–35)
AST: 115 U/L — AB (ref 0–37)
Albumin: 3.1 g/dL — ABNORMAL LOW (ref 3.5–5.2)
Alkaline Phosphatase: 45 U/L (ref 39–117)
Bilirubin, Direct: 0.1 mg/dL (ref 0.0–0.3)
Total Bilirubin: 0.8 mg/dL (ref 0.2–1.2)
Total Protein: 9.4 g/dL — ABNORMAL HIGH (ref 6.0–8.3)

## 2014-12-30 LAB — CBC WITH DIFFERENTIAL/PLATELET
BASOS PCT: 0.3 % (ref 0.0–3.0)
Basophils Absolute: 0 10*3/uL (ref 0.0–0.1)
EOS ABS: 0.1 10*3/uL (ref 0.0–0.7)
Eosinophils Relative: 1.5 % (ref 0.0–5.0)
HCT: 36.5 % (ref 36.0–46.0)
Hemoglobin: 11.8 g/dL — ABNORMAL LOW (ref 12.0–15.0)
LYMPHS PCT: 19.6 % (ref 12.0–46.0)
Lymphs Abs: 0.9 10*3/uL (ref 0.7–4.0)
MCHC: 32.5 g/dL (ref 30.0–36.0)
MCV: 89.4 fl (ref 78.0–100.0)
MONOS PCT: 14.3 % — AB (ref 3.0–12.0)
Monocytes Absolute: 0.6 10*3/uL (ref 0.1–1.0)
NEUTROS PCT: 64.3 % (ref 43.0–77.0)
Neutro Abs: 2.9 10*3/uL (ref 1.4–7.7)
Platelets: 257 10*3/uL (ref 150.0–400.0)
RBC: 4.08 Mil/uL (ref 3.87–5.11)
RDW: 16.1 % — ABNORMAL HIGH (ref 11.5–15.5)
WBC: 4.5 10*3/uL (ref 4.0–10.5)

## 2014-12-30 LAB — BASIC METABOLIC PANEL
BUN: 25 mg/dL — ABNORMAL HIGH (ref 6–23)
CALCIUM: 9 mg/dL (ref 8.4–10.5)
CO2: 22 meq/L (ref 19–32)
CREATININE: 1.1 mg/dL (ref 0.4–1.2)
Chloride: 108 mEq/L (ref 96–112)
GFR: 49.46 mL/min — ABNORMAL LOW (ref >60.00)
Glucose, Bld: 84 mg/dL (ref 70–99)
Potassium: 4.5 mEq/L (ref 3.5–5.1)
SODIUM: 135 meq/L (ref 135–145)

## 2014-12-30 LAB — MAGNESIUM: Magnesium: 1.9 mg/dL (ref 1.5–2.5)

## 2014-12-31 ENCOUNTER — Telehealth: Payer: Self-pay | Admitting: Internal Medicine

## 2014-12-31 ENCOUNTER — Encounter: Payer: Self-pay | Admitting: Internal Medicine

## 2014-12-31 DIAGNOSIS — E8809 Other disorders of plasma-protein metabolism, not elsewhere classified: Secondary | ICD-10-CM

## 2014-12-31 DIAGNOSIS — D649 Anemia, unspecified: Secondary | ICD-10-CM

## 2014-12-31 DIAGNOSIS — R779 Abnormality of plasma protein, unspecified: Secondary | ICD-10-CM

## 2014-12-31 NOTE — Telephone Encounter (Signed)
Pt notified of lab results via my chart.  Needs a non fasting lab appt within the next 7-10 days.  Please schedule and contact her with a lab appt date and time.  Thanks.    Dr Nicki Reaper

## 2015-01-02 ENCOUNTER — Encounter: Payer: Self-pay | Admitting: Internal Medicine

## 2015-01-02 DIAGNOSIS — R945 Abnormal results of liver function studies: Secondary | ICD-10-CM | POA: Insufficient documentation

## 2015-01-02 DIAGNOSIS — R7989 Other specified abnormal findings of blood chemistry: Secondary | ICD-10-CM | POA: Insufficient documentation

## 2015-01-02 DIAGNOSIS — Z6832 Body mass index (BMI) 32.0-32.9, adult: Secondary | ICD-10-CM | POA: Insufficient documentation

## 2015-01-02 NOTE — Progress Notes (Signed)
Subjective:    Patient ID: Lori [name], female    DOB: August 27, 1934, 81 y.o.   MRN: [id number]  HPI 81 year old female with past history of hypertension, hiatal hernia, reflux disease and iron and B12 deficiency who comes in today for a scheduled follow up.  She states she has been doing relatively well.  Had an EGD 9/13.  On protonix.   Upper symptoms controlled. Had follow up EGD 02/05/13.  Ulcer "gone" per pt.  Followed up with Dr Gustavo Lah.  Diagnosed with gastroparesis.   Monitoring her diet closely.  Bowels are better if she watches what she eats.  Reports breathing is stable.  No cardiac symptoms with increased activity or exertion.  No blood in the stool.  Still needing the ambien to help her sleep.  Blood pressure has been doing well.  On magnesium.  Cramps are better.  Recently found to have elevated liver enzymes.  See labs.  Referred to GI for further evaluation.       Past Medical History  Diagnosis Date  . Hypertension   . Hypothyroidism   . GERD (gastroesophageal reflux disease)   . Hypercholesterolemia   . Ulcer disease   . Diverticulosis   . Anemia     iron deficient  . B12 deficiency   . Fibrocystic breast disease     Current Outpatient Prescriptions on File Prior to Visit  Medication Sig Dispense Refill  . aspirin 81 MG tablet Take 81 mg by mouth daily.    Marland Kitchen atenolol (TENORMIN) 25 MG tablet Take 1 tablet (25 mg total) by mouth daily. 90 tablet 2  . Calcium Citrate (CITRACAL PO) Take 1,200 mg by mouth daily.    . Cholecalciferol (D3-1000 PO) Take by mouth daily.    . cyanocobalamin (,VITAMIN B-12,) 1000 MCG/ML injection Inject 1,000 mcg into the muscle every 30 (thirty) days.    . FORTICAL 200 UNIT/ACT nasal spray USE 1 SPRAY IN ONE NOSTRIL ONCE DAILY ALTERNATING NOSTRILS 3.7 mL 2  . GLUCOSAMINE-CHONDROITIN PO Take 2 tablets by mouth daily.    Marland Kitchen levothyroxine (SYNTHROID, LEVOTHROID) 75 MCG tablet Take 1 tablet (75 mcg total) by mouth daily. 90 tablet 3  .  magnesium oxide (MAG-OX) 400 MG tablet Take 1 tablet (400 mg total) by mouth daily. 30 tablet 1  . Mesalamine (DELZICOL) 400 MG CPDR Take 400 mg by mouth 3 (three) times daily.    . pantoprazole (PROTONIX) 40 MG tablet Take 40 mg by mouth daily.     . Probiotic Product (ALIGN) 4 MG CAPS Take 1 capsule by mouth daily.    . Wheat Dextrin (BENEFIBER PO) Take by mouth.     No current facility-administered medications on file prior to visit.    Review of Systems Patient denies any headache, lightheadedness or dizziness now. No sinus or allergy symptoms.   No chest pain, tightness or palpitations.  No increased shortness of breath, cough or congestion.  No nausea or vomiting.  Upper symptoms controlled.   No abdominal pain or cramping.  No BRBPR or melana.  Persistent flares of diarrhea, but overall much better if she watches what she eats.   No urine change.   Uses ambien to help her sleep.   Has gastroparesis.  Does report cramping in hands - better on magnesium.  Blood pressure averaging 127-128/60s.  Did have an episode of light headedness recently.  Blood pressure was low at that time.  Better.       Objective:  Physical Exam  Filed Vitals:   12/29/14 1211  BP: 100/60  Pulse: 57  Temp: 97.7 F (36.5 C)   Blood pressure recheck:  7/75  81 year old female in no acute distress.   HEENT:  Nares- clear.  Oropharynx - without lesions. NECK:  Supple.  Nontender.  No audible bruit.  HEART:  Appears to be regular. LUNGS:  No crackles or wheezing audible.  Respirations even and unlabored.  RADIAL PULSE:  Equal bilaterally. ABDOMEN:  Soft, nontender.  Bowel sounds present and normal.  No audible abdominal bruit.    EXTREMITIES:  No increased edema present.  DP pulses palpable and equal bilaterally.         Assessment & Plan:  GYN.  Has had no further bleeding or spotting.  Pap 01/11/11 negative.  Has seen Dr Amalia Hailey.  Had a large cervical polyp removed.  Follow up pap 02/26/13 normal.    Essential hypertension Blood pressure running a little low.  Change lotensin to 20mg  q day.  Follow pressures.  Get her back in soon to reassess.   - Basic metabolic panel  Cramp of both lower extremities Better on magnesium.  Recheck magnesium level.   - Magnesium  Anemia, unspecified anemia type Hgb 11/23/14 - 11.4.  Recheck today to confirm stable.   - CBC with Differential  Abnormal liver function tests Elevated recently.  Increased on last check.  Recheck today.  Has been referred to GI for further evaluation.  Abdominal ultrasound revealed liver - ok.   - Hepatic function panel  Obesity (BMI 30-39.9) Diet and exercise.    Barrett's esophagus with esophagitis Symptoms controlled on current medication regimen.    B12 deficiency Continue B12 injections.    Hypothyroidism, unspecified hypothyroidism type On thyroid replacement.  Follow tsh.    Renal insufficiency Cr last checked 11/23/14 - 1.1.    HEALTH MAINTENANCE.  Physical 03/08/14.  Pap as outlined.  Colonoscopy 12/05/12 revealed diverticulosis in the sigmoid colon, descending colon, transverse colon and ascending colon.  Exam otherwise normal.  Bone density 01/15/11 revealed osteopenia (decreased from previous).  F/u bone density in 3/14 - improved.  Received Reclast 05/24/11.  Mammogram 03/29/14 - Birads I.     I spent 25 minutes with the patient and more than 50% of the time was spent in consultation regarding the above.

## 2015-01-11 ENCOUNTER — Other Ambulatory Visit (INDEPENDENT_AMBULATORY_CARE_PROVIDER_SITE_OTHER): Payer: Medicare Other

## 2015-01-11 DIAGNOSIS — D649 Anemia, unspecified: Secondary | ICD-10-CM

## 2015-01-11 DIAGNOSIS — E8809 Other disorders of plasma-protein metabolism, not elsewhere classified: Secondary | ICD-10-CM | POA: Diagnosis not present

## 2015-01-11 DIAGNOSIS — R779 Abnormality of plasma protein, unspecified: Secondary | ICD-10-CM

## 2015-01-11 LAB — HEPATIC FUNCTION PANEL
ALT: 54 U/L — ABNORMAL HIGH (ref 0–35)
AST: 98 U/L — ABNORMAL HIGH (ref 0–37)
Albumin: 2.9 g/dL — ABNORMAL LOW (ref 3.5–5.2)
Alkaline Phosphatase: 50 U/L (ref 39–117)
Bilirubin, Direct: 0.1 mg/dL (ref 0.0–0.3)
TOTAL PROTEIN: 8.8 g/dL — AB (ref 6.0–8.3)
Total Bilirubin: 1 mg/dL (ref 0.2–1.2)

## 2015-01-12 ENCOUNTER — Encounter: Payer: Self-pay | Admitting: Internal Medicine

## 2015-01-12 LAB — PROTEIN ELECTRO, RANDOM URINE
ALPHA-2-GLOBULIN, U: 10.1 %
Albumin ELP, Urine: 5.6 %
Alpha-1-Globulin, U: 1.1 %
Beta Globulin, U: 30.3 %
GAMMA GLOBULIN, U: 52.9 %
M Component, Ur: 23.4 % — ABNORMAL HIGH
PROTEIN UR: 8.7 mg/dL (ref 0.0–15.0)

## 2015-01-13 ENCOUNTER — Encounter: Payer: Self-pay | Admitting: Internal Medicine

## 2015-01-13 LAB — PROTEIN ELECTROPHORESIS, SERUM
ALPHA-1-GLOBULIN: 2.6 % — AB (ref 2.9–4.9)
Albumin ELP: 35.9 % — ABNORMAL LOW (ref 55.8–66.1)
Alpha-2-Globulin: 5.5 % — ABNORMAL LOW (ref 7.1–11.8)
BETA 2: 5 % (ref 3.2–6.5)
BETA GLOBULIN: 4.7 % (ref 4.7–7.2)
Gamma Globulin: 46.3 % — ABNORMAL HIGH (ref 11.1–18.8)
Total Protein, Serum Electrophoresis: 8.8 g/dL — ABNORMAL HIGH (ref 6.0–8.3)

## 2015-01-14 NOTE — Telephone Encounter (Signed)
Unread mychart message mailed to patient 

## 2015-01-18 ENCOUNTER — Ambulatory Visit (INDEPENDENT_AMBULATORY_CARE_PROVIDER_SITE_OTHER): Payer: Medicare Other | Admitting: *Deleted

## 2015-01-18 DIAGNOSIS — E538 Deficiency of other specified B group vitamins: Secondary | ICD-10-CM | POA: Diagnosis not present

## 2015-01-18 MED ORDER — CYANOCOBALAMIN 1000 MCG/ML IJ SOLN
1000.0000 ug | Freq: Once | INTRAMUSCULAR | Status: AC
  Administered 2015-01-18: 1000 ug via INTRAMUSCULAR

## 2015-01-20 ENCOUNTER — Ambulatory Visit: Payer: Medicare Other | Admitting: Internal Medicine

## 2015-01-24 ENCOUNTER — Other Ambulatory Visit: Payer: Self-pay | Admitting: Internal Medicine

## 2015-02-02 DIAGNOSIS — R945 Abnormal results of liver function studies: Secondary | ICD-10-CM | POA: Diagnosis not present

## 2015-02-04 ENCOUNTER — Other Ambulatory Visit: Payer: Self-pay | Admitting: Internal Medicine

## 2015-02-04 DIAGNOSIS — R778 Other specified abnormalities of plasma proteins: Secondary | ICD-10-CM

## 2015-02-04 NOTE — Progress Notes (Signed)
Order placed for referral to hematology.  

## 2015-02-16 ENCOUNTER — Ambulatory Visit: Payer: Self-pay | Admitting: Oncology

## 2015-02-16 DIAGNOSIS — E780 Pure hypercholesterolemia: Secondary | ICD-10-CM | POA: Diagnosis not present

## 2015-02-16 DIAGNOSIS — I10 Essential (primary) hypertension: Secondary | ICD-10-CM | POA: Diagnosis not present

## 2015-02-16 DIAGNOSIS — D472 Monoclonal gammopathy: Secondary | ICD-10-CM | POA: Diagnosis not present

## 2015-02-16 DIAGNOSIS — K219 Gastro-esophageal reflux disease without esophagitis: Secondary | ICD-10-CM | POA: Diagnosis not present

## 2015-02-16 DIAGNOSIS — Z79899 Other long term (current) drug therapy: Secondary | ICD-10-CM | POA: Diagnosis not present

## 2015-02-16 DIAGNOSIS — E039 Hypothyroidism, unspecified: Secondary | ICD-10-CM | POA: Diagnosis not present

## 2015-02-16 DIAGNOSIS — Z7982 Long term (current) use of aspirin: Secondary | ICD-10-CM | POA: Diagnosis not present

## 2015-02-16 DIAGNOSIS — R8299 Other abnormal findings in urine: Secondary | ICD-10-CM | POA: Diagnosis not present

## 2015-02-16 DIAGNOSIS — K5790 Diverticulosis of intestine, part unspecified, without perforation or abscess without bleeding: Secondary | ICD-10-CM | POA: Diagnosis not present

## 2015-02-16 DIAGNOSIS — N6019 Diffuse cystic mastopathy of unspecified breast: Secondary | ICD-10-CM | POA: Diagnosis not present

## 2015-02-16 DIAGNOSIS — I739 Peripheral vascular disease, unspecified: Secondary | ICD-10-CM | POA: Diagnosis not present

## 2015-02-22 ENCOUNTER — Other Ambulatory Visit: Payer: Self-pay | Admitting: Internal Medicine

## 2015-02-22 ENCOUNTER — Ambulatory Visit (INDEPENDENT_AMBULATORY_CARE_PROVIDER_SITE_OTHER): Payer: Medicare Other | Admitting: *Deleted

## 2015-02-22 DIAGNOSIS — E538 Deficiency of other specified B group vitamins: Secondary | ICD-10-CM

## 2015-02-22 MED ORDER — CYANOCOBALAMIN 1000 MCG/ML IJ SOLN
1000.0000 ug | Freq: Once | INTRAMUSCULAR | Status: AC
  Administered 2015-02-22: 1000 ug via INTRAMUSCULAR

## 2015-03-01 ENCOUNTER — Ambulatory Visit
Admit: 2015-03-01 | Disposition: A | Discharge status: Home / Self Care | Payer: Self-pay | Attending: Oncology | Admitting: Oncology

## 2015-03-03 ENCOUNTER — Encounter: Payer: Self-pay | Admitting: Internal Medicine

## 2015-03-03 ENCOUNTER — Ambulatory Visit (INDEPENDENT_AMBULATORY_CARE_PROVIDER_SITE_OTHER): Payer: Medicare Other | Admitting: Internal Medicine

## 2015-03-03 VITALS — BP 120/60 | HR 60 | Temp 98.1°F | Ht 60.0 in | Wt 159.2 lb

## 2015-03-03 DIAGNOSIS — E669 Obesity, unspecified: Secondary | ICD-10-CM

## 2015-03-03 DIAGNOSIS — I10 Essential (primary) hypertension: Secondary | ICD-10-CM | POA: Diagnosis not present

## 2015-03-03 DIAGNOSIS — D179 Benign lipomatous neoplasm, unspecified: Secondary | ICD-10-CM | POA: Diagnosis not present

## 2015-03-03 DIAGNOSIS — R945 Abnormal results of liver function studies: Secondary | ICD-10-CM

## 2015-03-03 DIAGNOSIS — D649 Anemia, unspecified: Secondary | ICD-10-CM

## 2015-03-03 DIAGNOSIS — R7989 Other specified abnormal findings of blood chemistry: Secondary | ICD-10-CM | POA: Diagnosis not present

## 2015-03-03 DIAGNOSIS — R252 Cramp and spasm: Secondary | ICD-10-CM | POA: Diagnosis not present

## 2015-03-03 DIAGNOSIS — K2270 Barrett's esophagus without dysplasia: Secondary | ICD-10-CM | POA: Diagnosis not present

## 2015-03-03 DIAGNOSIS — Z1239 Encounter for other screening for malignant neoplasm of breast: Secondary | ICD-10-CM | POA: Diagnosis not present

## 2015-03-03 DIAGNOSIS — N289 Disorder of kidney and ureter, unspecified: Secondary | ICD-10-CM

## 2015-03-03 DIAGNOSIS — K209 Esophagitis, unspecified: Secondary | ICD-10-CM

## 2015-03-03 DIAGNOSIS — E039 Hypothyroidism, unspecified: Secondary | ICD-10-CM

## 2015-03-03 DIAGNOSIS — E538 Deficiency of other specified B group vitamins: Secondary | ICD-10-CM

## 2015-03-03 MED ORDER — MAGNESIUM OXIDE 400 MG PO TABS: 400.0000 mg | ORAL_TABLET | Freq: Every day | ORAL | Status: DC

## 2015-03-03 MED ORDER — ATENOLOL 25 MG PO TABS: 25.0000 mg | ORAL_TABLET | Freq: Every day | ORAL | Status: DC

## 2015-03-03 MED ORDER — ZOLPIDEM TARTRATE ER 6.25 MG PO TBCR: 6.2500 mg | EXTENDED_RELEASE_TABLET | Freq: Every evening | ORAL | Status: DC | PRN

## 2015-03-03 NOTE — Progress Notes (Signed)
Pre visit review using our clinic review tool, if applicable. No additional management support is needed unless otherwise documented below in the visit note. 

## 2015-03-07 ENCOUNTER — Encounter: Payer: Self-pay | Admitting: Internal Medicine

## 2015-03-07 DIAGNOSIS — D179 Benign lipomatous neoplasm, unspecified: Secondary | ICD-10-CM | POA: Insufficient documentation

## 2015-03-07 NOTE — Assessment & Plan Note (Signed)
Seeing Dr Gustavo Lah.  Undergoing w/up for abnormal liver function tests.

## 2015-03-07 NOTE — Assessment & Plan Note (Signed)
History of iron deficiency and B12 deficiency.  Follow hgb.

## 2015-03-07 NOTE — Assessment & Plan Note (Signed)
On protonix and carafate.  EGD 02/05/13.  Ulcer healed.  Currently upper symptoms controlled.  Follow.

## 2015-03-07 NOTE — Assessment & Plan Note (Signed)
Doing better on magnesium.  Follow.   

## 2015-03-07 NOTE — Assessment & Plan Note (Signed)
Continue B12 injections.   

## 2015-03-07 NOTE — Assessment & Plan Note (Signed)
Undergoing w/up.  Follow.

## 2015-03-07 NOTE — Assessment & Plan Note (Signed)
On thyroid replacement.  Follow tsh.  

## 2015-03-07 NOTE — Assessment & Plan Note (Signed)
Follow kidney function.

## 2015-03-07 NOTE — Assessment & Plan Note (Signed)
Soft tissue mass appears to be c/w fatty tumor.  Discussed further w/up.  She wants to monitor for now.  Follow.

## 2015-03-07 NOTE — Progress Notes (Signed)
Patient ID: Lori [name], female   DOB: 1934/10/02, 81 y.o.   MRN: [id number]   Subjective:    Patient ID: Lori [name], female    DOB: [birth year], 81 y.o.   MRN: [id number]  HPI  Patient here for a scheduled follow up.  States she feels she is doing relatively well.  Saw hematology - MGUS.  Had labs.  Planning for f/u in 3 months.   States her blood pressure has been averaging 120s/60s.  Only on lotensin now.  Breathing stable.  No cardiac symptoms with increased activity or exertion.  Concern over soft tissue mass - upper abdomen.  Appears to be c/w fatty tumor.  Saw Dr Gustavo Lah for abnormal liver function tests.  Undergoing w/up.     Past Medical History  Diagnosis Date  . Hypertension   . Hypothyroidism   . GERD (gastroesophageal reflux disease)   . Hypercholesterolemia   . Ulcer disease   . Diverticulosis   . Anemia     iron deficient  . B12 deficiency   . Fibrocystic breast disease     Current Outpatient Prescriptions on File Prior to Visit  Medication Sig Dispense Refill  . aspirin 81 MG tablet Take 81 mg by mouth daily.    . benazepril (LOTENSIN) 20 MG tablet TAKE ONE (1) TABLET BY MOUTH EVERY DAY 30 tablet 5  . Calcium Citrate (CITRACAL PO) Take 1,200 mg by mouth daily.    . Cholecalciferol (D3-1000 PO) Take by mouth daily.    . cyanocobalamin (,VITAMIN B-12,) 1000 MCG/ML injection Inject 1,000 mcg into the muscle every 30 (thirty) days.    . FORTICAL 200 UNIT/ACT nasal spray USE ONE (1) SPRAY IN ONE NOSTRIL ONCE DAILY; ALTERNATE NOSTRILS 3.7 mL 2  . GLUCOSAMINE-CHONDROITIN PO Take 2 tablets by mouth daily.    Marland Kitchen levothyroxine (SYNTHROID, LEVOTHROID) 75 MCG tablet Take 1 tablet (75 mcg total) by mouth daily. 90 tablet 3  . Mesalamine (DELZICOL) 400 MG CPDR Take 400 mg by mouth 3 (three) times daily.    . pantoprazole (PROTONIX) 40 MG tablet Take 40 mg by mouth daily.     . Probiotic Product (ALIGN) 4 MG CAPS Take 1 capsule by mouth daily.    . Wheat  Dextrin (BENEFIBER PO) Take by mouth.     No current facility-administered medications on file prior to visit.    Review of Systems  Constitutional: Negative for appetite change and unexpected weight change.  HENT: Negative for congestion and sinus pressure.   Respiratory: Negative for cough, chest tightness and shortness of breath.   Cardiovascular: Negative for chest pain, palpitations and leg swelling.  Gastrointestinal: Negative for nausea, vomiting and abdominal pain.       Bowels stable.  Still with intermittent flares.    Musculoskeletal: Negative for back pain and joint swelling.  Skin: Negative for color change and rash.  Neurological: Negative for dizziness, light-headedness and headaches.       Objective:     Blood pressure recheck:  118/68, pulse 60  Physical Exam  Constitutional: She appears well-developed and well-nourished. No distress.  HENT:  Nose: Nose normal.  Mouth/Throat: Oropharynx is clear and moist.  Neck: Neck supple. No thyromegaly present.  Cardiovascular: Normal rate and regular rhythm.   Pulmonary/Chest: Breath sounds normal. No respiratory distress. She has no wheezes.  Abdominal: Soft. Bowel sounds are normal. There is no tenderness.  Musculoskeletal: She exhibits no edema or tenderness.  Lymphadenopathy:    She has no cervical adenopathy.  Skin: No rash noted. No erythema.    BP 120/60 mmHg  Pulse 60  Temp(Src) 98.1 F (36.7 C) (Oral)  Ht 5' (1.524 m)  Wt 159 lb 4 oz (72.235 kg)  BMI 31.10 kg/m2  SpO2 94% Wt Readings from Last 3 Encounters:  03/03/15 159 lb 4 oz (72.235 kg)  12/29/14 166 lb 4 oz (75.411 kg)  11/15/14 173 lb 8 oz (78.699 kg)     Lab Results  Component Value Date   WBC 4.5 12/29/2014   HGB 11.8* 12/29/2014   HCT 36.5 12/29/2014   PLT 257.0 12/29/2014   GLUCOSE 84 12/29/2014   CHOL 114 11/23/2014   TRIG 96.0 11/23/2014   HDL 23.80* 11/23/2014   LDLCALC 71 11/23/2014   ALT 54* 01/11/2015   AST 98* 01/11/2015     NA 135 12/29/2014   K 4.5 12/29/2014   CL 108 12/29/2014   CREATININE 1.1 12/29/2014   BUN 25* 12/29/2014   CO2 22 12/29/2014   TSH 1.43 03/08/2014       Assessment & Plan:   Problem List Items Addressed This Visit    Abnormal liver function tests    Seeing Dr Gustavo Lah.  Undergoing w/up for abnormal liver function tests.       Anemia    History of iron deficiency and B12 deficiency.  Follow hgb.        B12 deficiency    Continue B12 injections.       Barrett's esophagus with esophagitis    On protonix and carafate.  EGD 02/05/13.  Ulcer healed.  Currently upper symptoms controlled.  Follow.        Fatty tumor    Soft tissue mass appears to be c/w fatty tumor.  Discussed further w/up.  She wants to monitor for now.  Follow.        Hand cramps    Doing better on magnesium.  Follow.       Hyperbilirubinemia    Undergoing w/up.  Follow.        Hypertension    Blood pressure doing well on lotensin.  Continue current meds.  Follow.        Relevant Medications   atenolol (TENORMIN) tablet   Hypothyroidism    On thyroid replacement.  Follow tsh.        Relevant Medications   atenolol (TENORMIN) tablet   Obesity (BMI 30-39.9)    Diet and exercise.        Relevant Medications   magnesium oxide (MAG-OX) 400 MG tablet   Renal insufficiency    Follow kidney function.         Other Visit Diagnoses    Breast cancer screening    -  Primary    Relevant Orders    MM DIGITAL SCREENING BILATERAL      I spent 25 minutes with the patient and more than 50% of the time was spent in consultation regarding the above.     Einar Pheasant, MD

## 2015-03-07 NOTE — Assessment & Plan Note (Signed)
Diet and exercise.   

## 2015-03-07 NOTE — Assessment & Plan Note (Signed)
Blood pressure doing well on lotensin.  Continue current meds.  Follow.

## 2015-03-15 ENCOUNTER — Other Ambulatory Visit: Payer: Self-pay | Admitting: *Deleted

## 2015-03-15 MED ORDER — BENAZEPRIL HCL 20 MG PO TABS: ORAL_TABLET | ORAL | Status: DC

## 2015-03-23 DIAGNOSIS — R945 Abnormal results of liver function studies: Secondary | ICD-10-CM | POA: Diagnosis not present

## 2015-03-29 ENCOUNTER — Ambulatory Visit (INDEPENDENT_AMBULATORY_CARE_PROVIDER_SITE_OTHER): Payer: Medicare Other

## 2015-03-29 DIAGNOSIS — E538 Deficiency of other specified B group vitamins: Secondary | ICD-10-CM | POA: Diagnosis not present

## 2015-03-29 MED ORDER — CYANOCOBALAMIN 1000 MCG/ML IJ SOLN
1000.0000 ug | Freq: Once | INTRAMUSCULAR | Status: AC
  Administered 2015-03-29: 1000 ug via INTRAMUSCULAR

## 2015-03-31 ENCOUNTER — Encounter: Payer: Self-pay | Admitting: *Deleted

## 2015-03-31 ENCOUNTER — Ambulatory Visit
Admit: 2015-03-31 | Disposition: A | Discharge status: Home / Self Care | Payer: Self-pay | Attending: Internal Medicine | Admitting: Internal Medicine

## 2015-03-31 DIAGNOSIS — Z1231 Encounter for screening mammogram for malignant neoplasm of breast: Secondary | ICD-10-CM | POA: Diagnosis not present

## 2015-03-31 LAB — HM MAMMOGRAPHY: HM Mammogram: NEGATIVE

## 2015-04-24 NOTE — Op Note (Signed)
PATIENT NAME:  Lori, [name] MR#:  [id number] DATE OF BIRTH:  Jul 05, 1934  DATE OF PROCEDURE:  02/08/2012  PREOPERATIVE DIAGNOSIS: Thickened endometrium with suspected endometrial polyps.   POSTOPERATIVE DIAGNOSIS: Thickened endometrium with suspected endometrial polyps.   PROCEDURE: Dilation and curettage, hysteroscopy.   SURGEON: Ricky L. Amalia Hailey, M.D.   ANESTHESIA: General orotracheal.   FINDINGS:. Multiple polyps from the endometrium. Hysteroscopy showed no evidence of abnormal discoloration or inconsistency of the endometrium.   Minimal endometrial curettings otherwise. God hemostasis, cosmesis.   ESTIMATED BLOOD LOSS: Minimal.   DRAINS: In and out catheter with red rubber at the beginning of the case, approximately 100 mL.   ANTIBIOTICS: One gram Ancef given IV preoperatively.  PROCEDURE IN DETAIL: The patient has had recurrent polyps removed through her cervix over the past several years. Recent ultrasound showed a thickened complex endometrium suspicious for endometrial polyps. Previous pathology had been benign; however, given the amount of material seen within the uterus, elected to proceed with dilation and curettage, hysteroscopy. This was discussed with the patient. She stated her understanding and desired to proceed. Consent was signed.   She was taken to the Operating Room, after she was given IV antibiotics, placed in the supine position, and anesthesia was initiated. She was then placed in the dorsal lithotomy position using Allen stirrups, prepped and draped in the usual sterile fashion. The cervix was visualized after draining the bladder. The cervix was easily dilated to a #17 [place]. Polyp forceps were used to remove numerous polyps, up to 3.5 cm long and over 1 cm thick. These appeared grayish-pink, soft, and pliant.   The hysteroscope was inserted. There was some ragged stumps of polyps that were visualized which were removed with polyp forceps and generalized  endometrial curetting.   Sharp curetting was then carried out with minimal return. At this point, the procedure was felt to have achieved maximum efficacy. The instruments were removed, the cervix was made hemostatic with silver nitrate x2, the speculum was removed, and all instrument, needle, and sponge counts were correct.   The patient tolerated the procedure well. She was left in the care of anesthesia. I anticipate a routine postoperative course. She will be discharged home with routine pain medicines and precautions.  ____________________________ Rockey Situ. Amalia Hailey, MD rle:slb D: 02/08/2012 10:15:19 ET T: 02/08/2012 10:34:29 ET JOB#: [id number]  cc: Ricky L. Amalia Hailey, MD, <Dictator> Selmer Dominion MD ELECTRONICALLY SIGNED 02/11/2012 7:46

## 2015-04-26 ENCOUNTER — Encounter: Payer: Self-pay | Admitting: Internal Medicine

## 2015-04-27 ENCOUNTER — Other Ambulatory Visit: Payer: Self-pay | Admitting: Gastroenterology

## 2015-04-27 DIAGNOSIS — R7989 Other specified abnormal findings of blood chemistry: Secondary | ICD-10-CM | POA: Diagnosis not present

## 2015-04-29 ENCOUNTER — Other Ambulatory Visit: Payer: Self-pay | Admitting: Gastroenterology

## 2015-04-29 ENCOUNTER — Ambulatory Visit: Admission: RE | Admit: 2015-04-29 | Payer: Medicare Other | Source: Ambulatory Visit

## 2015-04-29 DIAGNOSIS — R945 Abnormal results of liver function studies: Principal | ICD-10-CM

## 2015-04-29 DIAGNOSIS — R7989 Other specified abnormal findings of blood chemistry: Secondary | ICD-10-CM

## 2015-05-02 DIAGNOSIS — M79662 Pain in left lower leg: Secondary | ICD-10-CM | POA: Diagnosis not present

## 2015-05-02 DIAGNOSIS — M7989 Other specified soft tissue disorders: Secondary | ICD-10-CM | POA: Diagnosis not present

## 2015-05-03 ENCOUNTER — Other Ambulatory Visit: Payer: Self-pay | Admitting: Internal Medicine

## 2015-05-03 ENCOUNTER — Ambulatory Visit (INDEPENDENT_AMBULATORY_CARE_PROVIDER_SITE_OTHER): Payer: Medicare Other | Admitting: *Deleted

## 2015-05-03 DIAGNOSIS — E538 Deficiency of other specified B group vitamins: Secondary | ICD-10-CM | POA: Diagnosis not present

## 2015-05-03 MED ORDER — CYANOCOBALAMIN 1000 MCG/ML IJ SOLN
1000.0000 ug | Freq: Once | INTRAMUSCULAR | Status: AC
Start: 2015-05-03 — End: 2015-05-03
  Administered 2015-05-03: 1000 ug via INTRAMUSCULAR

## 2015-05-06 ENCOUNTER — Encounter: Payer: Self-pay | Admitting: Internal Medicine

## 2015-05-09 ENCOUNTER — Telehealth: Payer: Self-pay | Admitting: *Deleted

## 2015-05-12 ENCOUNTER — Other Ambulatory Visit: Payer: Self-pay | Admitting: Radiology

## 2015-05-13 ENCOUNTER — Ambulatory Visit
Admission: RE | Admit: 2015-05-13 | Discharge: 2015-05-13 | Disposition: A | Discharge status: Home / Self Care | Payer: Medicare Other | Source: Ambulatory Visit | Attending: Gastroenterology | Admitting: Gastroenterology

## 2015-05-13 DIAGNOSIS — R7989 Other specified abnormal findings of blood chemistry: Secondary | ICD-10-CM

## 2015-05-13 DIAGNOSIS — K759 Inflammatory liver disease, unspecified: Secondary | ICD-10-CM | POA: Insufficient documentation

## 2015-05-13 DIAGNOSIS — K754 Autoimmune hepatitis: Secondary | ICD-10-CM | POA: Diagnosis not present

## 2015-05-13 DIAGNOSIS — R945 Abnormal results of liver function studies: Secondary | ICD-10-CM

## 2015-05-13 DIAGNOSIS — K732 Chronic active hepatitis, not elsewhere classified: Secondary | ICD-10-CM | POA: Diagnosis not present

## 2015-05-13 HISTORY — DX: Chronic kidney disease, unspecified: N18.9

## 2015-05-13 LAB — CBC
HCT: 36.7 % (ref 35.0–47.0)
Hemoglobin: 12.2 g/dL (ref 12.0–16.0)
MCH: 30.1 pg (ref 26.0–34.0)
MCHC: 33.3 g/dL (ref 32.0–36.0)
MCV: 90.3 fL (ref 80.0–100.0)
PLATELETS: 194 10*3/uL (ref 150–440)
RBC: 4.06 MIL/uL (ref 3.80–5.20)
RDW: 14.4 % (ref 11.5–14.5)
WBC: 4.2 10*3/uL (ref 3.6–11.0)

## 2015-05-13 LAB — PROTIME-INR
INR: 1.03
Prothrombin Time: 13.7 seconds (ref 11.4–15.0)

## 2015-05-13 LAB — APTT: aPTT: 28 seconds (ref 24–36)

## 2015-05-13 MED ORDER — HYDROCODONE-ACETAMINOPHEN 5-325 MG PO TABS: 1.0000 | ORAL_TABLET | ORAL | Status: DC | PRN

## 2015-05-13 MED ORDER — FENTANYL CITRATE (PF) 100 MCG/2ML IJ SOLN
INTRAMUSCULAR | Status: DC
Start: 2015-05-13 — End: 2015-05-13
  Filled 2015-05-13: qty 2

## 2015-05-13 MED ORDER — SODIUM CHLORIDE 0.9 % IV SOLN
Freq: Once | INTRAVENOUS | Status: AC
  Administered 2015-05-13: 08:00:00 via INTRAVENOUS

## 2015-05-13 MED ORDER — MIDAZOLAM HCL 5 MG/5ML IJ SOLN
INTRAMUSCULAR | Status: AC
  Filled 2015-05-13: qty 5

## 2015-05-13 NOTE — Sedation Documentation (Signed)
No sedation needed

## 2015-05-13 NOTE — Brief Op Note (Signed)
US Liver Biopsy  9:14 AM  PATIENT:  Lori [name]  81 y.o. female  PRE-OPERATIVE DIAGNOSIS: Elevated LFT's   POST-OPERATIVE DIAGNOSIS:  same  PROCEDURE:  US liver biopsy  SURGEON: Quintus Premo PHYSICIAN ASSISTANT: none  ASSISTANTS: none  ANESTHESIA:  local  EBL:   none  BLOOD ADMINISTERED:none  DRAINS:none  LOCAL MEDICATIONS USED: none SPECIMEN:cores for path  DISPOSITION OF SPECIMEN: to pathology  PLAN OF CARE: to day surgery

## 2015-05-17 ENCOUNTER — Other Ambulatory Visit: Payer: Self-pay | Admitting: *Deleted

## 2015-05-17 ENCOUNTER — Other Ambulatory Visit: Payer: Self-pay

## 2015-05-17 ENCOUNTER — Other Ambulatory Visit: Payer: Self-pay | Admitting: Internal Medicine

## 2015-05-17 DIAGNOSIS — D649 Anemia, unspecified: Secondary | ICD-10-CM

## 2015-05-17 NOTE — Telephone Encounter (Signed)
Refilled ambien #30 with one refill.

## 2015-05-17 NOTE — Telephone Encounter (Signed)
Last OV 4.19.16, last refill 3.3.16.  Please advise refill

## 2015-05-17 NOTE — Telephone Encounter (Signed)
rx faxed

## 2015-05-18 ENCOUNTER — Inpatient Hospital Stay: Payer: Medicare Other | Attending: Oncology

## 2015-05-18 DIAGNOSIS — Z800 Family history of malignant neoplasm of digestive organs: Secondary | ICD-10-CM | POA: Diagnosis not present

## 2015-05-18 DIAGNOSIS — Z79899 Other long term (current) drug therapy: Secondary | ICD-10-CM | POA: Diagnosis not present

## 2015-05-18 DIAGNOSIS — E039 Hypothyroidism, unspecified: Secondary | ICD-10-CM | POA: Insufficient documentation

## 2015-05-18 DIAGNOSIS — Z8711 Personal history of peptic ulcer disease: Secondary | ICD-10-CM | POA: Insufficient documentation

## 2015-05-18 DIAGNOSIS — Z7982 Long term (current) use of aspirin: Secondary | ICD-10-CM | POA: Insufficient documentation

## 2015-05-18 DIAGNOSIS — E780 Pure hypercholesterolemia: Secondary | ICD-10-CM | POA: Diagnosis not present

## 2015-05-18 DIAGNOSIS — N189 Chronic kidney disease, unspecified: Secondary | ICD-10-CM | POA: Insufficient documentation

## 2015-05-18 DIAGNOSIS — N6019 Diffuse cystic mastopathy of unspecified breast: Secondary | ICD-10-CM | POA: Diagnosis not present

## 2015-05-18 DIAGNOSIS — Z809 Family history of malignant neoplasm, unspecified: Secondary | ICD-10-CM | POA: Insufficient documentation

## 2015-05-18 DIAGNOSIS — D509 Iron deficiency anemia, unspecified: Secondary | ICD-10-CM | POA: Insufficient documentation

## 2015-05-18 DIAGNOSIS — I10 Essential (primary) hypertension: Secondary | ICD-10-CM | POA: Insufficient documentation

## 2015-05-18 DIAGNOSIS — D649 Anemia, unspecified: Secondary | ICD-10-CM

## 2015-05-18 DIAGNOSIS — K219 Gastro-esophageal reflux disease without esophagitis: Secondary | ICD-10-CM | POA: Insufficient documentation

## 2015-05-18 DIAGNOSIS — D472 Monoclonal gammopathy: Secondary | ICD-10-CM | POA: Diagnosis not present

## 2015-05-18 DIAGNOSIS — E538 Deficiency of other specified B group vitamins: Secondary | ICD-10-CM | POA: Diagnosis not present

## 2015-05-18 LAB — CBC WITH DIFFERENTIAL/PLATELET
BASOS PCT: 1 %
Basophils Absolute: 0 10*3/uL (ref 0–0.1)
Eosinophils Absolute: 0 10*3/uL (ref 0–0.7)
Eosinophils Relative: 2 %
HEMATOCRIT: 37 % (ref 35.0–47.0)
Hemoglobin: 12.2 g/dL (ref 12.0–16.0)
Lymphocytes Relative: 19 %
Lymphs Abs: 0.6 10*3/uL — ABNORMAL LOW (ref 1.0–3.6)
MCH: 29.4 pg (ref 26.0–34.0)
MCHC: 33 g/dL (ref 32.0–36.0)
MCV: 89 fL (ref 80.0–100.0)
MONO ABS: 0.4 10*3/uL (ref 0.2–0.9)
MONOS PCT: 13 %
Neutro Abs: 2.2 10*3/uL (ref 1.4–6.5)
Neutrophils Relative %: 65 %
Platelets: 189 10*3/uL (ref 150–440)
RBC: 4.16 MIL/uL (ref 3.80–5.20)
RDW: 14.2 % (ref 11.5–14.5)
WBC: 3.3 10*3/uL — ABNORMAL LOW (ref 3.6–11.0)

## 2015-05-18 LAB — BASIC METABOLIC PANEL
Anion gap: 4 — ABNORMAL LOW (ref 5–15)
BUN: 23 mg/dL — ABNORMAL HIGH (ref 6–20)
CO2: 24 mmol/L (ref 22–32)
Calcium: 9 mg/dL (ref 8.9–10.3)
Chloride: 104 mmol/L (ref 101–111)
Creatinine, Ser: 1.01 mg/dL — ABNORMAL HIGH (ref 0.44–1.00)
GFR, EST NON AFRICAN AMERICAN: 52 mL/min — AB (ref >60)
Glucose, Bld: 113 mg/dL — ABNORMAL HIGH (ref 65–99)
Potassium: 4.2 mmol/L (ref 3.5–5.1)
Sodium: 132 mmol/L — ABNORMAL LOW (ref 135–145)

## 2015-05-18 LAB — SURGICAL PATHOLOGY

## 2015-05-19 DIAGNOSIS — K754 Autoimmune hepatitis: Secondary | ICD-10-CM | POA: Diagnosis not present

## 2015-05-19 LAB — PROTEIN ELECTROPHORESIS, SERUM
A/G Ratio: 0.6 — ABNORMAL LOW (ref 0.7–2.0)
ALPHA-2-GLOBULIN: 0.6 g/dL (ref 0.4–1.2)
Albumin ELP: 3.1 g/dL — ABNORMAL LOW (ref 3.2–5.6)
Alpha-1-Globulin: 0.3 g/dL (ref 0.1–0.4)
BETA GLOBULIN: 1.2 g/dL (ref 0.6–1.3)
GAMMA GLOBULIN: 3.1 g/dL — AB (ref 0.5–1.6)
Globulin, Total: 5.2 g/dL — ABNORMAL HIGH (ref 2.0–4.5)
TOTAL PROTEIN ELP: 8.3 g/dL (ref 6.0–8.5)

## 2015-05-19 LAB — KAPPA/LAMBDA LIGHT CHAINS
KAPPA, LAMDA LIGHT CHAIN RATIO: 2.27 — AB (ref 0.26–1.65)
Kappa free light chain: 180.41 mg/L — ABNORMAL HIGH (ref 3.30–19.40)
LAMDA FREE LIGHT CHAINS: 79.33 mg/L — AB (ref 5.71–26.30)

## 2015-05-19 LAB — BETA 2 MICROGLOBULIN, SERUM: Beta-2 Microglobulin: 5.2 mg/L — ABNORMAL HIGH (ref 0.6–2.4)

## 2015-05-25 ENCOUNTER — Inpatient Hospital Stay (HOSPITAL_BASED_OUTPATIENT_CLINIC_OR_DEPARTMENT_OTHER): Payer: Medicare Other | Admitting: Oncology

## 2015-05-25 VITALS — BP 157/75 | HR 61 | Temp 97.3°F | Resp 16 | Wt 156.3 lb

## 2015-05-25 DIAGNOSIS — Z809 Family history of malignant neoplasm, unspecified: Secondary | ICD-10-CM

## 2015-05-25 DIAGNOSIS — K219 Gastro-esophageal reflux disease without esophagitis: Secondary | ICD-10-CM

## 2015-05-25 DIAGNOSIS — E538 Deficiency of other specified B group vitamins: Secondary | ICD-10-CM

## 2015-05-25 DIAGNOSIS — D472 Monoclonal gammopathy: Secondary | ICD-10-CM | POA: Diagnosis not present

## 2015-05-25 DIAGNOSIS — Z8711 Personal history of peptic ulcer disease: Secondary | ICD-10-CM

## 2015-05-25 DIAGNOSIS — N189 Chronic kidney disease, unspecified: Secondary | ICD-10-CM

## 2015-05-25 DIAGNOSIS — N6019 Diffuse cystic mastopathy of unspecified breast: Secondary | ICD-10-CM

## 2015-05-25 DIAGNOSIS — I10 Essential (primary) hypertension: Secondary | ICD-10-CM | POA: Diagnosis not present

## 2015-05-25 DIAGNOSIS — E039 Hypothyroidism, unspecified: Secondary | ICD-10-CM | POA: Diagnosis not present

## 2015-05-25 DIAGNOSIS — Z7982 Long term (current) use of aspirin: Secondary | ICD-10-CM

## 2015-05-25 DIAGNOSIS — Z79899 Other long term (current) drug therapy: Secondary | ICD-10-CM

## 2015-05-25 DIAGNOSIS — E780 Pure hypercholesterolemia: Secondary | ICD-10-CM

## 2015-05-25 DIAGNOSIS — Z800 Family history of malignant neoplasm of digestive organs: Secondary | ICD-10-CM

## 2015-05-25 DIAGNOSIS — D509 Iron deficiency anemia, unspecified: Secondary | ICD-10-CM

## 2015-06-02 ENCOUNTER — Ambulatory Visit (INDEPENDENT_AMBULATORY_CARE_PROVIDER_SITE_OTHER): Payer: Medicare Other | Admitting: Internal Medicine

## 2015-06-02 ENCOUNTER — Encounter: Payer: Self-pay | Admitting: Internal Medicine

## 2015-06-02 VITALS — BP 98/63 | HR 56 | Temp 97.7°F | Ht 60.0 in | Wt 154.0 lb

## 2015-06-02 DIAGNOSIS — E039 Hypothyroidism, unspecified: Secondary | ICD-10-CM | POA: Diagnosis not present

## 2015-06-02 DIAGNOSIS — D472 Monoclonal gammopathy: Secondary | ICD-10-CM

## 2015-06-02 DIAGNOSIS — K2270 Barrett's esophagus without dysplasia: Secondary | ICD-10-CM

## 2015-06-02 DIAGNOSIS — R198 Other specified symptoms and signs involving the digestive system and abdomen: Secondary | ICD-10-CM

## 2015-06-02 DIAGNOSIS — I10 Essential (primary) hypertension: Secondary | ICD-10-CM

## 2015-06-02 DIAGNOSIS — D649 Anemia, unspecified: Secondary | ICD-10-CM

## 2015-06-02 DIAGNOSIS — K209 Esophagitis, unspecified: Secondary | ICD-10-CM

## 2015-06-02 DIAGNOSIS — R7989 Other specified abnormal findings of blood chemistry: Secondary | ICD-10-CM

## 2015-06-02 DIAGNOSIS — R945 Abnormal results of liver function studies: Secondary | ICD-10-CM

## 2015-06-02 DIAGNOSIS — E538 Deficiency of other specified B group vitamins: Secondary | ICD-10-CM | POA: Diagnosis not present

## 2015-06-02 DIAGNOSIS — N289 Disorder of kidney and ureter, unspecified: Secondary | ICD-10-CM

## 2015-06-02 DIAGNOSIS — E669 Obesity, unspecified: Secondary | ICD-10-CM

## 2015-06-02 DIAGNOSIS — Z0000 Encounter for general adult medical examination without abnormal findings: Secondary | ICD-10-CM

## 2015-06-02 MED ORDER — ATENOLOL 25 MG PO TABS: 25.0000 mg | ORAL_TABLET | Freq: Every day | ORAL | Status: DC

## 2015-06-02 NOTE — Progress Notes (Signed)
Patient ID: Lori [name], female   DOB: 01-Apr-1934, 81 y.o.   MRN: [id number]   Subjective:    Patient ID: Lori [name], female    DOB: [birth year], 81 y.o.   MRN: [id number]  HPI  Patient here for a scheduled follow up.  Has been seeing Dr Gustavo Lah for abnormal liver function tests.  Is s/p biopsy.  Waiting for results.  Due to see Dr Gustavo Lah on 06/28/15.  Seeing Dr Grayland Ormond for MGUS.  Eating and drinking well.  Bowels will still flare at times.  Overall better.  Blood pressure doing well.  No cardiac symptoms with increased activity or exertion.  breathing stable.     Past Medical History  Diagnosis Date  . Hypertension   . Hypothyroidism   . GERD (gastroesophageal reflux disease)   . Hypercholesterolemia   . Ulcer disease   . Diverticulosis   . Anemia     iron deficient  . B12 deficiency   . Fibrocystic breast disease   . Chronic kidney disease     Current Outpatient Prescriptions on File Prior to Visit  Medication Sig Dispense Refill  . aspirin 81 MG tablet Take 81 mg by mouth daily.    . benazepril (LOTENSIN) 20 MG tablet TAKE ONE (1) TABLET BY MOUTH EVERY DAY 90 tablet 1  . calcitonin, salmon, (MIACALCIN/FORTICAL) 200 UNIT/ACT nasal spray 1 spray once daily. Alternate nostrils.    . Calcium Citrate (CITRACAL PO) Take 1,200 mg by mouth daily.    . Cholecalciferol (D3-1000 PO) Take by mouth daily.    . Cyanocobalamin (B-12) 1000 MCG/ML KIT Inject 1,000 Units as directed.    . FORTICAL 200 UNIT/ACT nasal spray USE ONE (1) SPRAY IN ONE NOSTRIL ONCE DAILY; ALTERNATE NOSTRILS 3.7 mL 3  . GLUCOSAMINE-CHONDROITIN PO Take 2 tablets by mouth daily.    Marland Kitchen levothyroxine (SYNTHROID, LEVOTHROID) 50 MCG tablet Take by mouth.    . magnesium oxide (MAG-OX) 400 MG tablet Take 1 tablet (400 mg total) by mouth daily. 30 tablet 1  . Mesalamine (DELZICOL) 400 MG CPDR Take 400 mg by mouth 3 (three) times daily.    . pantoprazole (PROTONIX) 40 MG tablet Take 40 mg by mouth daily.      . Probiotic Product (ALIGN) 4 MG CAPS Take 1 capsule by mouth daily.    . Wheat Dextrin (BENEFIBER DRINK MIX) PACK Take by mouth.    . zolpidem (AMBIEN CR) 6.25 MG CR tablet TAKE 1 TABLET AT BEDTIME AS NEEDED FOR SLEEP 30 tablet 1   No current facility-administered medications on file prior to visit.    Review of Systems  Constitutional: Negative for appetite change and unexpected weight change.  HENT: Negative for congestion and sinus pressure.   Respiratory: Negative for cough, chest tightness and shortness of breath.   Cardiovascular: Negative for chest pain, palpitations and leg swelling.  Gastrointestinal: Negative for nausea, vomiting, abdominal pain and diarrhea.       Noticed mass LUQ.  Soft tissue mass.  Non tender.   Musculoskeletal: Negative for back pain.       Previous hand swelling.  Treated with prednisone.  Better now.   Skin: Negative for color change and rash.  Neurological: Negative for dizziness, light-headedness and headaches.  Hematological: Negative for adenopathy. Does not bruise/bleed easily.  Psychiatric/Behavioral: Negative for dysphoric mood and agitation.       Objective:     Blood pressure recheck:  128/64 (left) and 126/68 (right).    Physical Exam  Constitutional: She appears well-developed and well-nourished. No distress.  HENT:  Nose: Nose normal.  Mouth/Throat: Oropharynx is clear and moist.  Neck: Neck supple. No thyromegaly present.  Cardiovascular: Normal rate and regular rhythm.   Pulmonary/Chest: Breath sounds normal. No respiratory distress. She has no wheezes.  Abdominal: Soft. Bowel sounds are normal. There is no tenderness.  Soft tissue mass - LUQ.  Non tender.    Musculoskeletal: She exhibits no edema or tenderness.  Lymphadenopathy:    She has no cervical adenopathy.  Skin: No rash noted. No erythema.  Psychiatric: She has a normal mood and affect. Her behavior is normal.    BP 98/63 mmHg  Pulse 56  Temp(Src) 97.7 F (36.5  C) (Oral)  Ht 5' (1.524 m)  Wt 154 lb (69.854 kg)  BMI 30.08 kg/m2  SpO2 95% Wt Readings from Last 3 Encounters:  06/02/15 154 lb (69.854 kg)  05/25/15 156 lb 4.9 oz (70.9 kg)  03/03/15 159 lb 4 oz (72.235 kg)     Lab Results  Component Value Date   WBC 3.3* 05/18/2015   HGB 12.2 05/18/2015   HCT 37.0 05/18/2015   PLT 189 05/18/2015   GLUCOSE 113* 05/18/2015   CHOL 114 11/23/2014   TRIG 96.0 11/23/2014   HDL 23.80* 11/23/2014   LDLCALC 71 11/23/2014   ALT 54* 01/11/2015   AST 98* 01/11/2015   NA 132* 05/18/2015   K 4.2 05/18/2015   CL 104 05/18/2015   CREATININE 1.01* 05/18/2015   BUN 23* 05/18/2015   CO2 24 05/18/2015   TSH 1.43 03/08/2014   INR 1.03 05/13/2015    US Biopsy  05/13/2015   CLINICAL DATA:  Elevated LFTs  EXAM: ULTRASOUND GUIDED CORE BIOPSY OF LIVER  MEDICATIONS: No sedation  PROCEDURE: The procedure, risks, benefits, and alternatives were explained to the patient. Questions regarding the procedure were encouraged and answered. The patient understands and consents to the procedure.  The anterior abdominal wall was prepped with chlorhexidine in a sterile fashion, and a sterile drape was applied covering the operative field. A sterile gown and sterile gloves were used for the procedure. Local anesthesia was provided with 1% Lidocaine.  Utilizing real-time ultrasound guidance a 17 gauge guiding needle was placed percutaneously into the left lobe of the liver. Multiple 18 gauge core biopsies were then obtained. The guiding needle was then removed and Gel-Foam slurry placed to aid in hemostasis. The patient tolerated the procedure well and was returned to her room in satisfactory condition.  COMPLICATIONS: None.  IMPRESSION: Successful ultrasound-guided random liver biopsy.   Electronically Signed   By: Inez Catalina M.D.   On: 05/13/2015 11:24       Assessment & Plan:   Problem List Items Addressed This Visit    Abnormal liver function tests    Seeing Dr  Gustavo Lah.  S/p liver biopsy.  Has f/u planned later this month.        Anemia    History of iron deficiency and B12 deficiency.  Follow cbc.       B12 deficiency    Continue B12 injections.        Barrett's esophagus with esophagitis    Upper symptoms controlled on current regimen.  Follow.        Health care maintenance    Schedule a physical.  Mammogram 03/31/15 - Birads I.  Seeing GI.       Hypertension - Primary    Blood pressure doing well.  Same medication regimen.  Follow pressures.  Follow metabolic panel.       Relevant Medications   atenolol (TENORMIN) 25 MG tablet   Hypothyroidism    On thyroid replacement.  Follow tsh.       Relevant Medications   atenolol (TENORMIN) 25 MG tablet   LUQ fullness    Soft tissue mass as outlined.  Have Dr Tamala Julian evaluate.        Relevant Orders   Ambulatory referral to General Surgery   MGUS (monoclonal gammopathy of unknown significance)    MGUS - followed by Dr Grayland Ormond.        Obesity (BMI 30-39.9)    Diet and exercise.  Follow.       Renal insufficiency    Follow kidney function.          I spent 25 minutes with the patient and more than 50% of the time was spent in consultation regarding the above.     Einar Pheasant, MD

## 2015-06-02 NOTE — Progress Notes (Signed)
Pre visit review using our clinic review tool, if applicable. No additional management support is needed unless otherwise documented below in the visit note. 

## 2015-06-06 ENCOUNTER — Encounter: Payer: Self-pay | Admitting: Internal Medicine

## 2015-06-06 DIAGNOSIS — R198 Other specified symptoms and signs involving the digestive system and abdomen: Secondary | ICD-10-CM | POA: Insufficient documentation

## 2015-06-06 DIAGNOSIS — Z0000 Encounter for general adult medical examination without abnormal findings: Secondary | ICD-10-CM | POA: Insufficient documentation

## 2015-06-06 DIAGNOSIS — D472 Monoclonal gammopathy: Secondary | ICD-10-CM | POA: Insufficient documentation

## 2015-06-06 NOTE — Assessment & Plan Note (Signed)
MGUS - followed by Dr Grayland Ormond.

## 2015-06-06 NOTE — Assessment & Plan Note (Signed)
Seeing Dr Gustavo Lah.  S/p liver biopsy.  Has f/u planned later this month.

## 2015-06-06 NOTE — Assessment & Plan Note (Signed)
History of iron deficiency and B12 deficiency.  Follow cbc.

## 2015-06-06 NOTE — Assessment & Plan Note (Signed)
Schedule a physical.  Mammogram 03/31/15 - Birads I.  Seeing GI.

## 2015-06-06 NOTE — Assessment & Plan Note (Signed)
Follow kidney function.

## 2015-06-06 NOTE — Assessment & Plan Note (Signed)
Soft tissue mass as outlined.  Have Dr Tamala Julian evaluate.

## 2015-06-06 NOTE — Assessment & Plan Note (Signed)
Diet and exercise.  Follow.  

## 2015-06-06 NOTE — Assessment & Plan Note (Signed)
Blood pressure doing well.  Same medication regimen.  Follow pressures.  Follow metabolic panel.   

## 2015-06-06 NOTE — Assessment & Plan Note (Signed)
Continue B12 injections.   

## 2015-06-06 NOTE — Assessment & Plan Note (Signed)
On thyroid replacement.  Follow tsh.  

## 2015-06-06 NOTE — Assessment & Plan Note (Signed)
Upper symptoms controlled on current regimen.  Follow.   

## 2015-06-07 ENCOUNTER — Ambulatory Visit (INDEPENDENT_AMBULATORY_CARE_PROVIDER_SITE_OTHER): Payer: Medicare Other | Admitting: *Deleted

## 2015-06-07 DIAGNOSIS — E538 Deficiency of other specified B group vitamins: Secondary | ICD-10-CM | POA: Diagnosis not present

## 2015-06-07 MED ORDER — CYANOCOBALAMIN 1000 MCG/ML IJ SOLN
1000.0000 ug | Freq: Once | INTRAMUSCULAR | Status: AC
  Administered 2015-06-07: 1000 ug via INTRAMUSCULAR

## 2015-06-12 NOTE — Progress Notes (Signed)
[place]  [phone] [phone]) [phone]  ID: Lori [name] OB: 01/12/1934  MR#: [id number]  YSA#:[id number]  Patient Care Team: Einar Pheasant, MD as PCP - General (Internal Medicine)  CHIEF COMPLAINT:  Chief Complaint  Patient presents with  . Follow-up    MGUS    INTERVAL HISTORY: Patient returns to clinic today for repeat laboratory work and routine six-month follow-up. Currently, she feels well and is asymptomatic. She has no neurologic complaints. She denies any recent fevers or illnesses. She has a good appetite and denies weight loss. She denies any pain. She has no chest pain or shortness of breath. She denies any nausea, vomiting, constipation, or diarrhea. She has no urinary complaints. Patient offers no specific complaints today.  REVIEW OF SYSTEMS:   Review of Systems  Constitutional: Negative.     As per HPI. Otherwise, a complete review of systems is negatve.  PAST MEDICAL HISTORY: Past Medical History  Diagnosis Date  . Hypertension   . Hypothyroidism   . GERD (gastroesophageal reflux disease)   . Hypercholesterolemia   . Ulcer disease   . Diverticulosis   . Anemia     iron deficient  . B12 deficiency   . Fibrocystic breast disease   . Chronic kidney disease     PAST SURGICAL HISTORY: Past Surgical History  Procedure Laterality Date  . Lap hiatal hernia repair  07/08/08  . Left oophorectomy  1993    benign ovarian cyst  . Tubal ligation  1981    FAMILY HISTORY Family History  Problem Relation Age of Onset  . Stroke Mother   . Breast cancer Neg Hx   . Colon cancer Neg Hx        ADVANCED DIRECTIVES:    HEALTH MAINTENANCE: History  Substance Use Topics  . Smoking status: Never Smoker   . Smokeless tobacco: Never Used  . Alcohol Use: No     Colonoscopy:  PAP:  Bone density:  Lipid panel:  Allergies  Allergen Reactions  . Penicillins   . Daypro [Oxaprozin] Rash  . Lodine [Etodolac] Rash      Current Outpatient Prescriptions  Medication Sig Dispense Refill  . calcitonin, salmon, (MIACALCIN/FORTICAL) 200 UNIT/ACT nasal spray 1 spray once daily. Alternate nostrils.    Marland Kitchen levothyroxine (SYNTHROID, LEVOTHROID) 50 MCG tablet Take by mouth.    . Wheat Dextrin (BENEFIBER DRINK MIX) PACK Take by mouth.    Marland Kitchen aspirin 81 MG tablet Take 81 mg by mouth daily.    Marland Kitchen atenolol (TENORMIN) 25 MG tablet Take 1 tablet (25 mg total) by mouth daily. 90 tablet 3  . benazepril (LOTENSIN) 20 MG tablet TAKE ONE (1) TABLET BY MOUTH EVERY DAY 90 tablet 1  . Calcium Citrate (CITRACAL PO) Take 1,200 mg by mouth daily.    . Cholecalciferol (D3-1000 PO) Take by mouth daily.    . Cyanocobalamin (B-12) 1000 MCG/ML KIT Inject 1,000 Units as directed.    . FORTICAL 200 UNIT/ACT nasal spray USE ONE (1) SPRAY IN ONE NOSTRIL ONCE DAILY; ALTERNATE NOSTRILS 3.7 mL 3  . GLUCOSAMINE-CHONDROITIN PO Take 2 tablets by mouth daily.    . magnesium oxide (MAG-OX) 400 MG tablet Take 1 tablet (400 mg total) by mouth daily. 30 tablet 1  . Mesalamine (DELZICOL) 400 MG CPDR Take 400 mg by mouth 3 (three) times daily.    . pantoprazole (PROTONIX) 40 MG tablet Take 40 mg by mouth daily.     . Probiotic Product (ALIGN) 4 MG CAPS  Take 1 capsule by mouth daily.    Marland Kitchen zolpidem (AMBIEN CR) 6.25 MG CR tablet TAKE 1 TABLET AT BEDTIME AS NEEDED FOR SLEEP 30 tablet 1   No current facility-administered medications for this visit.    OBJECTIVE: Filed Vitals:   05/25/15 1245  BP: 157/75  Pulse: 61  Temp: 97.3 F (36.3 C)  Resp: 16     Body mass index is 29.55 kg/(m^2).    ECOG FS:0 - Asymptomatic  General: Well-developed, well-nourished, no acute distress. Eyes: anicteric sclera. Lungs: Clear to auscultation bilaterally. Heart: Regular rate and rhythm. No rubs, murmurs, or gallops. Abdomen: Soft, nontender, nondistended. No organomegaly noted, normoactive bowel sounds. Musculoskeletal: No edema, cyanosis, or clubbing. Neuro:  Alert, answering all questions appropriately. Cranial nerves grossly intact. Skin: No rashes or petechiae noted. Psych: Normal affect.    LAB RESULTS:  Lab Results  Component Value Date   NA 132* 05/18/2015   K 4.2 05/18/2015   CL 104 05/18/2015   CO2 24 05/18/2015   GLUCOSE 113* 05/18/2015   BUN 23* 05/18/2015   CREATININE 1.01* 05/18/2015   CALCIUM 9.0 05/18/2015   PROT 8.8* 01/11/2015   ALBUMIN 2.9* 01/11/2015   AST 98* 01/11/2015   ALT 54* 01/11/2015   ALKPHOS 50 01/11/2015   BILITOT 1.0 01/11/2015   GFRNONAA 52* 05/18/2015   GFRAA >60 05/18/2015    Lab Results  Component Value Date   WBC 3.3* 05/18/2015   NEUTROABS 2.2 05/18/2015   HGB 12.2 05/18/2015   HCT 37.0 05/18/2015   MCV 89.0 05/18/2015   PLT 189 05/18/2015     STUDIES: No results found.  ASSESSMENT: MGUS with elevated M spike on UIEP.  PLAN:    1.  MGUS: SIEP is negative. Patient also has a mildly elevated Kappa/lambda ratio.  She has no evidence of endorgan damage. Will consider metastatic bone survey in the future. No intervention is needed at this time. Patient does not require bone marrow biopsy. Return to clinic in 6 months with repeat laboratory work and further evaluation.   Patient expressed understanding and was in agreement with this plan. She also understands that She can call clinic at any time with any questions, concerns, or complaints.    Lloyd Huger, MD   06/12/2015 5:46 PM

## 2015-06-17 DIAGNOSIS — R229 Localized swelling, mass and lump, unspecified: Secondary | ICD-10-CM | POA: Diagnosis not present

## 2015-06-28 DIAGNOSIS — K754 Autoimmune hepatitis: Secondary | ICD-10-CM | POA: Diagnosis not present

## 2015-06-29 DIAGNOSIS — K754 Autoimmune hepatitis: Secondary | ICD-10-CM | POA: Diagnosis not present

## 2015-07-01 ENCOUNTER — Other Ambulatory Visit: Payer: Self-pay | Admitting: Internal Medicine

## 2015-07-07 ENCOUNTER — Ambulatory Visit (INDEPENDENT_AMBULATORY_CARE_PROVIDER_SITE_OTHER): Payer: Medicare Other | Admitting: *Deleted

## 2015-07-07 ENCOUNTER — Telehealth: Payer: Self-pay | Admitting: Internal Medicine

## 2015-07-07 DIAGNOSIS — E538 Deficiency of other specified B group vitamins: Secondary | ICD-10-CM

## 2015-07-07 MED ORDER — ZOLPIDEM TARTRATE ER 6.25 MG PO TBCR: 6.2500 mg | EXTENDED_RELEASE_TABLET | Freq: Every evening | ORAL | Status: DC | PRN

## 2015-07-07 MED ORDER — CYANOCOBALAMIN 1000 MCG/ML IJ SOLN
1000.0000 ug | Freq: Once | INTRAMUSCULAR | Status: AC
  Administered 2015-07-07: 1000 ug via INTRAMUSCULAR

## 2015-07-07 NOTE — Telephone Encounter (Signed)
Pt request refill on Lolpidem Tartrate. Pt given triage form. Form given to Nitchia/msn

## 2015-07-07 NOTE — Telephone Encounter (Signed)
Refilled ambien #30 with one refill.

## 2015-07-07 NOTE — Telephone Encounter (Signed)
Pt aware.  Rx faxed.  

## 2015-07-07 NOTE — Telephone Encounter (Signed)
Pt came in spoke with Melissa requesting Zolpidem Tartrate 6.25 mg tab refill.  Last refill 5.17.16, last OV 6.2.16.  Please advise refill

## 2015-08-09 ENCOUNTER — Ambulatory Visit (INDEPENDENT_AMBULATORY_CARE_PROVIDER_SITE_OTHER): Payer: Medicare Other | Admitting: *Deleted

## 2015-08-09 DIAGNOSIS — E538 Deficiency of other specified B group vitamins: Secondary | ICD-10-CM

## 2015-08-09 MED ORDER — CYANOCOBALAMIN 1000 MCG/ML IJ SOLN
1000.0000 ug | Freq: Once | INTRAMUSCULAR | Status: AC
  Administered 2015-08-09: 1000 ug via INTRAMUSCULAR

## 2015-09-07 ENCOUNTER — Ambulatory Visit (INDEPENDENT_AMBULATORY_CARE_PROVIDER_SITE_OTHER): Payer: Medicare Other | Admitting: Internal Medicine

## 2015-09-07 ENCOUNTER — Encounter: Payer: Self-pay | Admitting: Internal Medicine

## 2015-09-07 VITALS — BP 110/70 | HR 53 | Temp 98.2°F | Ht 60.0 in | Wt 156.2 lb

## 2015-09-07 DIAGNOSIS — Z23 Encounter for immunization: Secondary | ICD-10-CM | POA: Diagnosis not present

## 2015-09-07 DIAGNOSIS — K209 Esophagitis, unspecified without bleeding: Secondary | ICD-10-CM

## 2015-09-07 DIAGNOSIS — K2270 Barrett's esophagus without dysplasia: Secondary | ICD-10-CM | POA: Diagnosis not present

## 2015-09-07 DIAGNOSIS — I10 Essential (primary) hypertension: Secondary | ICD-10-CM | POA: Diagnosis not present

## 2015-09-07 DIAGNOSIS — Z0000 Encounter for general adult medical examination without abnormal findings: Secondary | ICD-10-CM

## 2015-09-07 DIAGNOSIS — R7989 Other specified abnormal findings of blood chemistry: Secondary | ICD-10-CM

## 2015-09-07 DIAGNOSIS — E538 Deficiency of other specified B group vitamins: Secondary | ICD-10-CM | POA: Diagnosis not present

## 2015-09-07 DIAGNOSIS — R945 Abnormal results of liver function studies: Secondary | ICD-10-CM

## 2015-09-07 DIAGNOSIS — D649 Anemia, unspecified: Secondary | ICD-10-CM

## 2015-09-07 DIAGNOSIS — E039 Hypothyroidism, unspecified: Secondary | ICD-10-CM | POA: Diagnosis not present

## 2015-09-07 DIAGNOSIS — E669 Obesity, unspecified: Secondary | ICD-10-CM

## 2015-09-07 DIAGNOSIS — N289 Disorder of kidney and ureter, unspecified: Secondary | ICD-10-CM

## 2015-09-07 DIAGNOSIS — Z79899 Other long term (current) drug therapy: Secondary | ICD-10-CM | POA: Diagnosis not present

## 2015-09-07 DIAGNOSIS — D472 Monoclonal gammopathy: Secondary | ICD-10-CM

## 2015-09-07 LAB — BASIC METABOLIC PANEL
BUN: 20 mg/dL (ref 6–23)
CALCIUM: 9.5 mg/dL (ref 8.4–10.5)
CO2: 27 mEq/L (ref 19–32)
CREATININE: 0.9 mg/dL (ref 0.40–1.20)
Chloride: 103 mEq/L (ref 96–112)
GFR: 64.21 mL/min (ref >60.00)
Glucose, Bld: 83 mg/dL (ref 70–99)
Potassium: 4.4 mEq/L (ref 3.5–5.1)
Sodium: 136 mEq/L (ref 135–145)

## 2015-09-07 LAB — LIPID PANEL
CHOL/HDL RATIO: 4
Cholesterol: 151 mg/dL (ref 0–200)
HDL: 40.1 mg/dL (ref >39.00)
LDL CALC: 92 mg/dL (ref 0–99)
NONHDL: 110.57
TRIGLYCERIDES: 91 mg/dL (ref 0.0–149.0)
VLDL: 18.2 mg/dL (ref 0.0–40.0)

## 2015-09-07 LAB — TSH: TSH: 1.7 u[IU]/mL (ref 0.35–4.50)

## 2015-09-07 MED ORDER — ZOLPIDEM TARTRATE ER 6.25 MG PO TBCR: 6.2500 mg | EXTENDED_RELEASE_TABLET | Freq: Every evening | ORAL | Status: DC | PRN

## 2015-09-07 MED ORDER — LEVOTHYROXINE SODIUM 50 MCG PO TABS: 50.0000 ug | ORAL_TABLET | Freq: Every day | ORAL | Status: DC

## 2015-09-07 MED ORDER — ATENOLOL 25 MG PO TABS: 25.0000 mg | ORAL_TABLET | Freq: Every day | ORAL | Status: DC

## 2015-09-07 MED ORDER — CALCITONIN (SALMON) 200 UNIT/ACT NA SOLN: 1.0000 | Freq: Every day | NASAL | Status: DC

## 2015-09-07 NOTE — Progress Notes (Signed)
Pre-visit discussion using our clinic review tool. No additional management support is needed unless otherwise documented below in the visit note.  

## 2015-09-07 NOTE — Progress Notes (Signed)
Patient ID: Lori [name], female   DOB: 18-Jul-1934, 81 y.o.   MRN: [id number]   Subjective:    Patient ID: Lori [name], female    DOB: 02-04-1934, 81 y.o.   MRN: [id number]  HPI  Patient here to follow up on her current medical issues.  Her bowels (overall) have been stable.  Had a flare two weeks ago.  Due to see Dr Gustavo Lah in a couple of weeks.  No headache or light headedness.  Tries to stay active.  No cardiac symptoms with increased activity or exertion.  No sob.  Is s/p recent liver biopsy.    Past Medical History  Diagnosis Date  . Hypertension   . Hypothyroidism   . GERD (gastroesophageal reflux disease)   . Hypercholesterolemia   . Ulcer disease   . Diverticulosis   . Anemia     iron deficient  . B12 deficiency   . Fibrocystic breast disease   . Chronic kidney disease    Past Surgical History  Procedure Laterality Date  . Lap hiatal hernia repair  07/08/08  . Left oophorectomy  1993    benign ovarian cyst  . Tubal ligation  1981   Family History  Problem Relation Age of Onset  . Stroke Mother   . Breast cancer Neg Hx   . Colon cancer Neg Hx    Social History   Social History  . Marital Status: Widowed    Spouse Name: N/A  . Number of Children: 0  . Years of Education: N/A   Social History Main Topics  . Smoking status: Never Smoker   . Smokeless tobacco: Never Used  . Alcohol Use: No  . Drug Use: No  . Sexual Activity: Not Asked   Other Topics Concern  . None   Social History Narrative    Outpatient Encounter Prescriptions as of 09/07/2015  Medication Sig  . aspirin 81 MG tablet Take 81 mg by mouth daily.  Marland Kitchen atenolol (TENORMIN) 25 MG tablet Take 1 tablet (25 mg total) by mouth daily.  . benazepril (LOTENSIN) 20 MG tablet TAKE ONE (1) TABLET BY MOUTH EVERY DAY  . Calcium Citrate (CITRACAL PO) Take 1,200 mg by mouth daily.  . Cholecalciferol (D3-1000 PO) Take by mouth daily.  . Cyanocobalamin (B-12) 1000 MCG/ML KIT Inject 1,000  Units as directed.  Marland Kitchen GLUCOSAMINE-CHONDROITIN PO Take 2 tablets by mouth daily.  Marland Kitchen levothyroxine (SYNTHROID, LEVOTHROID) 50 MCG tablet Take 1 tablet (50 mcg total) by mouth daily before breakfast.  . magnesium oxide (MAG-OX) 400 (241.3 MG) MG tablet TAKE ONE (1) TABLET EACH DAY  . Mesalamine (DELZICOL) 400 MG CPDR Take 400 mg by mouth 3 (three) times daily.  . pantoprazole (PROTONIX) 40 MG tablet Take 40 mg by mouth daily.   . Probiotic Product (ALIGN) 4 MG CAPS Take 1 capsule by mouth daily.  . Wheat Dextrin (BENEFIBER DRINK MIX) PACK Take by mouth.  . zolpidem (AMBIEN CR) 6.25 MG CR tablet Take 1 tablet (6.25 mg total) by mouth at bedtime as needed. for sleep  . [DISCONTINUED] atenolol (TENORMIN) 25 MG tablet Take 1 tablet (25 mg total) by mouth daily.  . [DISCONTINUED] FORTICAL 200 UNIT/ACT nasal spray USE ONE (1) SPRAY IN ONE NOSTRIL ONCE DAILY; ALTERNATE NOSTRILS  . [DISCONTINUED] levothyroxine (SYNTHROID, LEVOTHROID) 50 MCG tablet Take by mouth.  . [DISCONTINUED] zolpidem (AMBIEN CR) 6.25 MG CR tablet Take 1 tablet (6.25 mg total) by mouth at bedtime as needed. for sleep  . calcitonin, salmon, (  MIACALCIN/FORTICAL) 200 UNIT/ACT nasal spray Place 1 spray into alternate nostrils daily. 1 spray once daily. Alternate nostrils.  . [DISCONTINUED] calcitonin, salmon, (MIACALCIN/FORTICAL) 200 UNIT/ACT nasal spray 1 spray once daily. Alternate nostrils.   No facility-administered encounter medications on file as of 09/07/2015.    Review of Systems  Constitutional: Negative for appetite change and unexpected weight change.  HENT: Negative for congestion and sinus pressure.   Eyes: Negative for pain and visual disturbance.  Respiratory: Negative for cough, chest tightness and shortness of breath.   Cardiovascular: Negative for chest pain, palpitations and leg swelling.  Gastrointestinal: Negative for nausea, vomiting and abdominal pain.  Genitourinary: Negative for dysuria and difficulty urinating.   Musculoskeletal: Negative for back pain and joint swelling.  Skin: Negative for color change and rash.  Neurological: Negative for dizziness, light-headedness and headaches.  Hematological: Negative for adenopathy. Does not bruise/bleed easily.  Psychiatric/Behavioral: Negative for dysphoric mood and agitation.       Objective:     Blood pressure rechecked by me:  138/78  Physical Exam  Constitutional: She is oriented to person, place, and time. She appears well-developed and well-nourished. No distress.  HENT:  Nose: Nose normal.  Mouth/Throat: Oropharynx is clear and moist.  Eyes: Right eye exhibits no discharge. Left eye exhibits no discharge. No scleral icterus.  Neck: Neck supple. No thyromegaly present.  Cardiovascular: Normal rate and regular rhythm.   Pulmonary/Chest: Breath sounds normal. No accessory muscle usage. No tachypnea. No respiratory distress. She has no decreased breath sounds. She has no wheezes. She has no rhonchi. Right breast exhibits no inverted nipple, no mass, no nipple discharge and no tenderness (no axillary adenopathy). Left breast exhibits no inverted nipple, no mass, no nipple discharge and no tenderness (no axilarry adenopathy).  Abdominal: Soft. Bowel sounds are normal. There is no tenderness.  Musculoskeletal: She exhibits no edema or tenderness.  Lymphadenopathy:    She has no cervical adenopathy.  Neurological: She is alert and oriented to person, place, and time.  Skin: Skin is warm. No rash noted. No erythema.  Psychiatric: She has a normal mood and affect. Her behavior is normal.    BP 110/70 mmHg  Pulse 53  Temp(Src) 98.2 F (36.8 C) (Oral)  Ht 5' (1.524 m)  Wt 156 lb 4 oz (70.875 kg)  BMI 30.52 kg/m2  SpO2 97% Wt Readings from Last 3 Encounters:  09/07/15 156 lb 4 oz (70.875 kg)  06/02/15 154 lb (69.854 kg)  05/25/15 156 lb 4.9 oz (70.9 kg)     Lab Results  Component Value Date   WBC 3.3* 05/18/2015   HGB 12.2 05/18/2015    HCT 37.0 05/18/2015   PLT 189 05/18/2015   GLUCOSE 83 09/07/2015   CHOL 151 09/07/2015   TRIG 91.0 09/07/2015   HDL 40.10 09/07/2015   LDLCALC 92 09/07/2015   ALT 54* 01/11/2015   AST 98* 01/11/2015   NA 136 09/07/2015   K 4.4 09/07/2015   CL 103 09/07/2015   CREATININE 0.90 09/07/2015   BUN 20 09/07/2015   CO2 27 09/07/2015   TSH 1.70 09/07/2015   INR 1.03 05/13/2015    US Biopsy  05/13/2015   CLINICAL DATA:  Elevated LFTs  EXAM: ULTRASOUND GUIDED CORE BIOPSY OF LIVER  MEDICATIONS: No sedation  PROCEDURE: The procedure, risks, benefits, and alternatives were explained to the patient. Questions regarding the procedure were encouraged and answered. The patient understands and consents to the procedure.  The anterior abdominal wall was prepped with  chlorhexidine in a sterile fashion, and a sterile drape was applied covering the operative field. A sterile gown and sterile gloves were used for the procedure. Local anesthesia was provided with 1% Lidocaine.  Utilizing real-time ultrasound guidance a 17 gauge guiding needle was placed percutaneously into the left lobe of the liver. Multiple 18 gauge core biopsies were then obtained. The guiding needle was then removed and Gel-Foam slurry placed to aid in hemostasis. The patient tolerated the procedure well and was returned to her room in satisfactory condition.  COMPLICATIONS: None.  IMPRESSION: Successful ultrasound-guided random liver biopsy.   Electronically Signed   By: Inez Catalina M.D.   On: 05/13/2015 11:24       Assessment & Plan:   Problem List Items Addressed This Visit    Abnormal liver function tests    Seeing Dr Gustavo Lah.  S/p liver biopsy.  Per his note, diagnosed with autoimmune hepatitis.  Has f/u planned in two weeks.        Anemia    History of iron deficiency and b12 deficiency.  Follow cbc.       B12 deficiency    Continue B12 injections.        Barrett's esophagus with esophagitis    Upper symptoms controlled on  current regimen.  Follow.       Health care maintenance    Physical today 09/07/15.  Mammogram 03/31/15 - Birads I.  Followed by GI.       Hypertension - Primary    Blood pressure under good control.  Continue same medication regimen.  Follow pressures.  Follow metabolic panel.        Relevant Medications   atenolol (TENORMIN) 25 MG tablet   Other Relevant Orders   Lipid panel (Completed)   Basic metabolic panel (Completed)   Hypothyroidism    On thyroid replacement.  Follow tsh.       Relevant Medications   atenolol (TENORMIN) 25 MG tablet   levothyroxine (SYNTHROID, LEVOTHROID) 50 MCG tablet   Other Relevant Orders   TSH (Completed)   MGUS (monoclonal gammopathy of unknown significance)    Followed by Dr Grayland Ormond.        Obesity (BMI 30-39.9)    Diet and exercise.  Follow.       Renal insufficiency    Follow kidney function tests.            Einar Pheasant, MD

## 2015-09-12 ENCOUNTER — Encounter: Payer: Self-pay | Admitting: Internal Medicine

## 2015-09-12 NOTE — Assessment & Plan Note (Signed)
Upper symptoms controlled on current regimen.  Follow.   

## 2015-09-12 NOTE — Assessment & Plan Note (Signed)
Continue B12 injections.   

## 2015-09-12 NOTE — Assessment & Plan Note (Signed)
Diet and exercise.  Follow.  

## 2015-09-12 NOTE — Assessment & Plan Note (Signed)
Seeing Dr Gustavo Lah.  S/p liver biopsy.  Per his note, diagnosed with autoimmune hepatitis.  Has f/u planned in two weeks.

## 2015-09-12 NOTE — Assessment & Plan Note (Signed)
Followed by Dr Finnegan.  

## 2015-09-12 NOTE — Assessment & Plan Note (Signed)
Physical today 09/07/15.  Mammogram 03/31/15 - Birads I.  Followed by GI.

## 2015-09-12 NOTE — Assessment & Plan Note (Signed)
Follow kidney function tests.

## 2015-09-12 NOTE — Assessment & Plan Note (Signed)
History of iron deficiency and b12 deficiency.  Follow cbc.

## 2015-09-12 NOTE — Assessment & Plan Note (Signed)
Blood pressure under good control.  Continue same medication regimen.  Follow pressures.  Follow metabolic panel.   

## 2015-09-12 NOTE — Assessment & Plan Note (Signed)
On thyroid replacement.  Follow tsh.  

## 2015-09-13 ENCOUNTER — Ambulatory Visit (INDEPENDENT_AMBULATORY_CARE_PROVIDER_SITE_OTHER): Payer: Medicare Other

## 2015-09-13 DIAGNOSIS — E538 Deficiency of other specified B group vitamins: Secondary | ICD-10-CM | POA: Diagnosis not present

## 2015-09-13 MED ORDER — CYANOCOBALAMIN 1000 MCG/ML IJ SOLN
1000.0000 ug | Freq: Once | INTRAMUSCULAR | Status: AC
  Administered 2015-09-13: 1000 ug via INTRAMUSCULAR

## 2015-09-13 NOTE — Progress Notes (Signed)
Patient here for a b12 injection.  Received injection in Right deltoid.  Patient tolerated well!

## 2015-09-14 DIAGNOSIS — K754 Autoimmune hepatitis: Secondary | ICD-10-CM | POA: Diagnosis not present

## 2015-09-21 DIAGNOSIS — K754 Autoimmune hepatitis: Secondary | ICD-10-CM | POA: Diagnosis not present

## 2015-10-05 ENCOUNTER — Encounter: Payer: Self-pay | Admitting: Internal Medicine

## 2015-10-06 DIAGNOSIS — H2513 Age-related nuclear cataract, bilateral: Secondary | ICD-10-CM | POA: Diagnosis not present

## 2015-10-18 ENCOUNTER — Ambulatory Visit (INDEPENDENT_AMBULATORY_CARE_PROVIDER_SITE_OTHER): Payer: Medicare Other

## 2015-10-18 DIAGNOSIS — E538 Deficiency of other specified B group vitamins: Secondary | ICD-10-CM

## 2015-10-18 MED ORDER — CYANOCOBALAMIN 1000 MCG/ML IJ SOLN
1000.0000 ug | Freq: Once | INTRAMUSCULAR | Status: AC
  Administered 2015-10-18: 1000 ug via INTRAMUSCULAR

## 2015-10-18 NOTE — Progress Notes (Signed)
Patient came in for b12 injection.  Received in left deltoid.  Patient tolerated well.

## 2015-11-15 ENCOUNTER — Other Ambulatory Visit: Payer: Self-pay | Admitting: Internal Medicine

## 2015-11-22 ENCOUNTER — Ambulatory Visit (INDEPENDENT_AMBULATORY_CARE_PROVIDER_SITE_OTHER): Payer: Medicare Other

## 2015-11-22 DIAGNOSIS — E538 Deficiency of other specified B group vitamins: Secondary | ICD-10-CM | POA: Diagnosis not present

## 2015-11-22 MED ORDER — CYANOCOBALAMIN 1000 MCG/ML IJ SOLN
1000.0000 ug | Freq: Once | INTRAMUSCULAR | Status: AC
  Administered 2015-11-22: 1000 ug via INTRAMUSCULAR

## 2015-11-22 NOTE — Progress Notes (Signed)
Patient came in for a b12 injection.  Received in right deltoid.  Patient tolerated well.

## 2015-11-23 ENCOUNTER — Inpatient Hospital Stay: Payer: Medicare Other | Attending: Oncology | Admitting: *Deleted

## 2015-11-23 DIAGNOSIS — D472 Monoclonal gammopathy: Secondary | ICD-10-CM | POA: Diagnosis not present

## 2015-11-23 LAB — BASIC METABOLIC PANEL
Anion gap: 5 (ref 5–15)
BUN: 23 mg/dL — AB (ref 6–20)
CALCIUM: 9.4 mg/dL (ref 8.9–10.3)
CHLORIDE: 105 mmol/L (ref 101–111)
CO2: 27 mmol/L (ref 22–32)
CREATININE: 0.83 mg/dL (ref 0.44–1.00)
GFR calc non Af Amer: >60 mL/min (ref >60)
GLUCOSE: 115 mg/dL — AB (ref 65–99)
Potassium: 4.3 mmol/L (ref 3.5–5.1)
Sodium: 137 mmol/L (ref 135–145)

## 2015-11-23 LAB — CBC WITH DIFFERENTIAL/PLATELET
BASOS PCT: 1 %
Basophils Absolute: 0 10*3/uL (ref 0–0.1)
EOS ABS: 0.1 10*3/uL (ref 0–0.7)
EOS PCT: 3 %
HCT: 37.5 % (ref 35.0–47.0)
Hemoglobin: 12.5 g/dL (ref 12.0–16.0)
Lymphocytes Relative: 16 %
Lymphs Abs: 0.6 10*3/uL — ABNORMAL LOW (ref 1.0–3.6)
MCH: 29.2 pg (ref 26.0–34.0)
MCHC: 33.4 g/dL (ref 32.0–36.0)
MCV: 87.6 fL (ref 80.0–100.0)
MONO ABS: 0.4 10*3/uL (ref 0.2–0.9)
MONOS PCT: 11 %
Neutro Abs: 2.6 10*3/uL (ref 1.4–6.5)
Neutrophils Relative %: 69 %
Platelets: 180 10*3/uL (ref 150–440)
RBC: 4.29 MIL/uL (ref 3.80–5.20)
RDW: 13.5 % (ref 11.5–14.5)
WBC: 3.8 10*3/uL (ref 3.6–11.0)

## 2015-11-24 LAB — IFE AND PE, RANDOM URINE
% BETA, URINE: 30.8 %
ALPHA 1 URINE: 2.4 %
ALPHA 2 UR: 13 %
Albumin, U: 12.5 %
GAMMA GLOBULIN URINE: 41.2 %
TOTAL PROTEIN, URINE-UPE24: 20.1 mg/dL

## 2015-11-25 ENCOUNTER — Inpatient Hospital Stay: Payer: Medicare Other

## 2015-11-28 LAB — PROTEIN ELECTROPHORESIS, SERUM
A/G Ratio: 0.7 (ref 0.7–1.7)
ALBUMIN ELP: 3.6 g/dL (ref 2.9–4.4)
ALPHA-1-GLOBULIN: 0.2 g/dL (ref 0.0–0.4)
ALPHA-2-GLOBULIN: 0.5 g/dL (ref 0.4–1.0)
BETA GLOBULIN: 1.2 g/dL (ref 0.7–1.3)
GAMMA GLOBULIN: 3.1 g/dL — AB (ref 0.4–1.8)
Globulin, Total: 5 g/dL — ABNORMAL HIGH (ref 2.2–3.9)
Total Protein ELP: 8.6 g/dL — ABNORMAL HIGH (ref 6.0–8.5)

## 2015-12-01 ENCOUNTER — Encounter: Payer: Self-pay | Admitting: Oncology

## 2015-12-01 ENCOUNTER — Inpatient Hospital Stay: Payer: Medicare Other | Attending: Oncology | Admitting: Oncology

## 2015-12-01 VITALS — BP 125/65 | HR 64 | Temp 96.7°F | Resp 16 | Ht 60.0 in | Wt 162.0 lb

## 2015-12-01 DIAGNOSIS — N189 Chronic kidney disease, unspecified: Secondary | ICD-10-CM | POA: Diagnosis not present

## 2015-12-01 DIAGNOSIS — I129 Hypertensive chronic kidney disease with stage 1 through stage 4 chronic kidney disease, or unspecified chronic kidney disease: Secondary | ICD-10-CM | POA: Diagnosis not present

## 2015-12-01 DIAGNOSIS — Z803 Family history of malignant neoplasm of breast: Secondary | ICD-10-CM | POA: Diagnosis not present

## 2015-12-01 DIAGNOSIS — E7800 Pure hypercholesterolemia, unspecified: Secondary | ICD-10-CM | POA: Diagnosis not present

## 2015-12-01 DIAGNOSIS — Z8719 Personal history of other diseases of the digestive system: Secondary | ICD-10-CM | POA: Diagnosis not present

## 2015-12-01 DIAGNOSIS — D472 Monoclonal gammopathy: Secondary | ICD-10-CM | POA: Diagnosis not present

## 2015-12-01 DIAGNOSIS — E538 Deficiency of other specified B group vitamins: Secondary | ICD-10-CM

## 2015-12-01 DIAGNOSIS — Z800 Family history of malignant neoplasm of digestive organs: Secondary | ICD-10-CM

## 2015-12-01 DIAGNOSIS — Z7982 Long term (current) use of aspirin: Secondary | ICD-10-CM | POA: Diagnosis not present

## 2015-12-01 DIAGNOSIS — E039 Hypothyroidism, unspecified: Secondary | ICD-10-CM | POA: Diagnosis not present

## 2015-12-01 DIAGNOSIS — D509 Iron deficiency anemia, unspecified: Secondary | ICD-10-CM

## 2015-12-01 DIAGNOSIS — K219 Gastro-esophageal reflux disease without esophagitis: Secondary | ICD-10-CM | POA: Diagnosis not present

## 2015-12-01 DIAGNOSIS — Z79899 Other long term (current) drug therapy: Secondary | ICD-10-CM

## 2015-12-01 DIAGNOSIS — Z8711 Personal history of peptic ulcer disease: Secondary | ICD-10-CM

## 2015-12-01 NOTE — Progress Notes (Signed)
Patient here today for follow up MGUS. States she is doing well. Denies any health concerns today.

## 2015-12-09 NOTE — Progress Notes (Signed)
[facility]  Telephone:(336) [phone] Fax:(336) [phone]  ID: Lori [name] OB: 06/12/1934  MR#: [id number]  UTM#:[id number]  Patient Care Team: Einar Pheasant, MD as PCP - General (Internal Medicine)  CHIEF COMPLAINT:  Chief Complaint  Patient presents with  . MGUS    INTERVAL HISTORY: Patient returns to clinic today for repeat laboratory work and routine six-month follow-up. Currently, she feels well and is asymptomatic. She has no neurologic complaints. She denies any recent fevers or illnesses. She has a good appetite and denies weight loss. She denies any pain. She has no chest pain or shortness of breath. She denies any nausea, vomiting, constipation, or diarrhea. She has no urinary complaints. Patient offers no specific complaints today.  REVIEW OF SYSTEMS:   Review of Systems  Constitutional: Negative.   Respiratory: Negative.   Cardiovascular: Negative.   Gastrointestinal: Negative.   Genitourinary: Negative.   Musculoskeletal: Negative.   Neurological: Negative.     As per HPI. Otherwise, a complete review of systems is negatve.  PAST MEDICAL HISTORY: Past Medical History  Diagnosis Date  . Hypertension   . Hypothyroidism   . GERD (gastroesophageal reflux disease)   . Hypercholesterolemia   . Ulcer disease   . Diverticulosis   . Anemia     iron deficient  . B12 deficiency   . Fibrocystic breast disease   . Chronic kidney disease     PAST SURGICAL HISTORY: Past Surgical History  Procedure Laterality Date  . Lap hiatal hernia repair  07/08/08  . Left oophorectomy  1993    benign ovarian cyst  . Tubal ligation  1981    FAMILY HISTORY Family History  Problem Relation Age of Onset  . Stroke Mother   . Breast cancer Neg Hx   . Colon cancer Neg Hx        ADVANCED DIRECTIVES:    HEALTH MAINTENANCE: Social History  Substance Use Topics  . Smoking status: Never Smoker   . Smokeless tobacco: Never Used  . Alcohol Use: No       Colonoscopy:  PAP:  Bone density:  Lipid panel:  Allergies  Allergen Reactions  . Penicillins   . Daypro [Oxaprozin] Rash  . Lodine [Etodolac] Rash    Current Outpatient Prescriptions  Medication Sig Dispense Refill  . aspirin 81 MG tablet Take 81 mg by mouth daily.    Marland Kitchen atenolol (TENORMIN) 25 MG tablet Take 1 tablet (25 mg total) by mouth daily. 90 tablet 3  . benazepril (LOTENSIN) 20 MG tablet TAKE ONE (1) TABLET BY MOUTH EVERY DAY 90 tablet 1  . calcitonin, salmon, (MIACALCIN/FORTICAL) 200 UNIT/ACT nasal spray Place 1 spray into alternate nostrils daily. 1 spray once daily. Alternate nostrils. 3.7 mL 11  . Calcium Citrate (CITRACAL PO) Take 1,200 mg by mouth daily.    . Cholecalciferol (D3-1000 PO) Take by mouth daily.    . Cyanocobalamin (B-12) 1000 MCG/ML KIT Inject 1,000 Units as directed.    Marland Kitchen GLUCOSAMINE-CHONDROITIN PO Take 2 tablets by mouth daily.    Marland Kitchen levothyroxine (SYNTHROID, LEVOTHROID) 50 MCG tablet Take 1 tablet (50 mcg total) by mouth daily before breakfast. 90 tablet 3  . magnesium oxide (MAG-OX) 400 (241.3 MG) MG tablet TAKE ONE (1) TABLET EACH DAY 30 tablet 5  . Mesalamine (DELZICOL) 400 MG CPDR Take 400 mg by mouth 3 (three) times daily.    . pantoprazole (PROTONIX) 40 MG tablet Take 40 mg by mouth daily.     . Probiotic Product (ALIGN)  4 MG CAPS Take 1 capsule by mouth daily.    . Wheat Dextrin (BENEFIBER DRINK MIX) PACK Take by mouth.    . zolpidem (AMBIEN CR) 6.25 MG CR tablet TAKE ONE TABLET BY MOUTH AT BEDTIME AS NEEDED FOR SLEEP 30 tablet 1   No current facility-administered medications for this visit.    OBJECTIVE: Filed Vitals:   12/01/15 1041  BP: 125/65  Pulse: 64  Temp: 96.7 F (35.9 C)  Resp: 16     Body mass index is 31.65 kg/(m^2).    ECOG FS:0 - Asymptomatic  General: Well-developed, well-nourished, no acute distress. Eyes: anicteric sclera. Lungs: Clear to auscultation bilaterally. Heart: Regular rate and rhythm. No rubs,  murmurs, or gallops. Abdomen: Soft, nontender, nondistended. No organomegaly noted, normoactive bowel sounds. Musculoskeletal: No edema, cyanosis, or clubbing. Neuro: Alert, answering all questions appropriately. Cranial nerves grossly intact. Skin: No rashes or petechiae noted. Psych: Normal affect.    LAB RESULTS:  Lab Results  Component Value Date   NA 137 11/23/2015   K 4.3 11/23/2015   CL 105 11/23/2015   CO2 27 11/23/2015   GLUCOSE 115* 11/23/2015   BUN 23* 11/23/2015   CREATININE 0.83 11/23/2015   CALCIUM 9.4 11/23/2015   PROT 8.8* 01/11/2015   ALBUMIN 2.9* 01/11/2015   AST 98* 01/11/2015   ALT 54* 01/11/2015   ALKPHOS 50 01/11/2015   BILITOT 1.0 01/11/2015   GFRNONAA >60 11/23/2015   GFRAA >60 11/23/2015    Lab Results  Component Value Date   WBC 3.8 11/23/2015   NEUTROABS 2.6 11/23/2015   HGB 12.5 11/23/2015   HCT 37.5 11/23/2015   MCV 87.6 11/23/2015   PLT 180 11/23/2015     STUDIES: No results found.  ASSESSMENT: MGUS with elevated M spike on UIEP.  PLAN:    1.  MGUS: SIEP is negative. UIEP is also now negative. She has no evidence of endorgan damage. Patient only has a mildly elevated kappa/lambda ratio. No intervention is needed at this time. Patient does not require bone marrow biopsy. Return to clinic in 1 yeat with repeat laboratory work and further evaluation. If she continues to be without an M spike and her kappa/lambda ratio remains relatively stable, she likely can be discharged from clinic.  Patient expressed understanding and was in agreement with this plan. She also understands that She can call clinic at any time with any questions, concerns, or complaints.    Lloyd Huger, MD   12/09/2015 5:45 PM

## 2015-12-14 DIAGNOSIS — K754 Autoimmune hepatitis: Secondary | ICD-10-CM | POA: Diagnosis not present

## 2015-12-19 ENCOUNTER — Encounter
Admission: RE | Disposition: A | Discharge status: Home / Self Care | Payer: Self-pay | Source: Ambulatory Visit | Attending: Gastroenterology

## 2015-12-19 ENCOUNTER — Ambulatory Visit
Admission: RE | Admit: 2015-12-19 | Discharge: 2015-12-19 | Disposition: A | Discharge status: Home / Self Care | Payer: Medicare Other | Source: Ambulatory Visit | Attending: Gastroenterology | Admitting: Gastroenterology

## 2015-12-19 SURGERY — ESOPHAGOGASTRODUODENOSCOPY (EGD) WITH PROPOFOL
Anesthesia: General

## 2015-12-19 MED ORDER — SODIUM CHLORIDE 0.9 % IV SOLN
INTRAVENOUS | Status: DC
  Administered 2015-12-19: 1000 mL via INTRAVENOUS

## 2015-12-19 NOTE — H&P (Signed)
Patient presenting for EGD in regards to history of long segment Barrett's esophagus. She did not interrupt her aspirin taking her last dose last night. In light of her history of long segment Barrett's esophagus and the need to do upwards of 30 biopsies feel it will be better to hold this procedure for bit discontinue the aspirin and then proceed. We'll need to be off the aspirin 3-5 days. Patient did have a IV placed was not given anesthesia or taken to the procedure room.

## 2015-12-27 ENCOUNTER — Ambulatory Visit (INDEPENDENT_AMBULATORY_CARE_PROVIDER_SITE_OTHER): Payer: Medicare Other

## 2015-12-27 DIAGNOSIS — E538 Deficiency of other specified B group vitamins: Secondary | ICD-10-CM

## 2015-12-27 MED ORDER — CYANOCOBALAMIN 1000 MCG/ML IJ SOLN
1000.0000 ug | Freq: Once | INTRAMUSCULAR | Status: AC
  Administered 2015-12-27: 1000 ug via INTRAMUSCULAR

## 2015-12-27 NOTE — Progress Notes (Signed)
Patient came in for b12 injection.  Received in Left deltoid.  Patient tolerated well  

## 2016-01-01 DIAGNOSIS — K2270 Barrett's esophagus without dysplasia: Secondary | ICD-10-CM

## 2016-01-01 HISTORY — DX: Barrett's esophagus without dysplasia: K22.70

## 2016-01-11 ENCOUNTER — Encounter: Payer: Self-pay | Admitting: Internal Medicine

## 2016-01-11 ENCOUNTER — Ambulatory Visit (INDEPENDENT_AMBULATORY_CARE_PROVIDER_SITE_OTHER): Payer: Medicare Other | Admitting: Internal Medicine

## 2016-01-11 VITALS — BP 120/70 | HR 58 | Temp 97.7°F | Resp 18 | Ht 60.0 in | Wt 159.4 lb

## 2016-01-11 DIAGNOSIS — I10 Essential (primary) hypertension: Secondary | ICD-10-CM

## 2016-01-11 DIAGNOSIS — N289 Disorder of kidney and ureter, unspecified: Secondary | ICD-10-CM

## 2016-01-11 DIAGNOSIS — R945 Abnormal results of liver function studies: Secondary | ICD-10-CM

## 2016-01-11 DIAGNOSIS — K2270 Barrett's esophagus without dysplasia: Secondary | ICD-10-CM

## 2016-01-11 DIAGNOSIS — D472 Monoclonal gammopathy: Secondary | ICD-10-CM

## 2016-01-11 DIAGNOSIS — M8580 Other specified disorders of bone density and structure, unspecified site: Secondary | ICD-10-CM

## 2016-01-11 DIAGNOSIS — R7989 Other specified abnormal findings of blood chemistry: Secondary | ICD-10-CM

## 2016-01-11 DIAGNOSIS — R739 Hyperglycemia, unspecified: Secondary | ICD-10-CM

## 2016-01-11 DIAGNOSIS — K209 Esophagitis, unspecified: Secondary | ICD-10-CM

## 2016-01-11 DIAGNOSIS — E039 Hypothyroidism, unspecified: Secondary | ICD-10-CM | POA: Diagnosis not present

## 2016-01-11 DIAGNOSIS — D649 Anemia, unspecified: Secondary | ICD-10-CM

## 2016-01-11 LAB — HEPATIC FUNCTION PANEL
ALBUMIN: 4 g/dL (ref 3.5–5.2)
ALT: 17 U/L (ref 0–35)
AST: 26 U/L (ref 0–37)
Alkaline Phosphatase: 47 U/L (ref 39–117)
BILIRUBIN DIRECT: 0.2 mg/dL (ref 0.0–0.3)
TOTAL PROTEIN: 8.6 g/dL — AB (ref 6.0–8.3)
Total Bilirubin: 0.8 mg/dL (ref 0.2–1.2)

## 2016-01-11 LAB — BASIC METABOLIC PANEL
BUN: 35 mg/dL — AB (ref 6–23)
CO2: 26 mEq/L (ref 19–32)
Calcium: 9.7 mg/dL (ref 8.4–10.5)
Chloride: 105 mEq/L (ref 96–112)
Creatinine, Ser: 0.97 mg/dL (ref 0.40–1.20)
GFR: 58.84 mL/min — AB (ref >60.00)
Glucose, Bld: 102 mg/dL — ABNORMAL HIGH (ref 70–99)
POTASSIUM: 4.7 meq/L (ref 3.5–5.1)
Sodium: 137 mEq/L (ref 135–145)

## 2016-01-11 LAB — HEMOGLOBIN A1C: HEMOGLOBIN A1C: 5.9 % (ref 4.6–6.5)

## 2016-01-11 LAB — TSH: TSH: 1.38 u[IU]/mL (ref 0.35–4.50)

## 2016-01-11 MED ORDER — ZOLPIDEM TARTRATE ER 6.25 MG PO TBCR: 6.2500 mg | EXTENDED_RELEASE_TABLET | Freq: Every evening | ORAL | Status: DC | PRN

## 2016-01-11 NOTE — Progress Notes (Signed)
Pre-visit discussion using our clinic review tool. No additional management support is needed unless otherwise documented below in the visit note.  

## 2016-01-11 NOTE — Progress Notes (Signed)
Patient ID: Lori [name], female   DOB: 06/11/1934, 82 y.o.   MRN: [id number]   Subjective:    Patient ID: Lori [name], female    DOB: [birth year], 82 y.o.   MRN: [id number]  HPI  Patient with past history of hypercholesterolemia, GERD, hypothyroidism and hypertension.  She comes in today to follow up on these issues.  She is doing relatively well.  No chest pain or tightness.  No sob.  No acid reflux.  No abdominal pain or cramping.  Bowels stable.  Still flare intermittently.  Seeing Dr Grayland Ormond for MGUS.     Past Medical History  Diagnosis Date  . Hypertension   . Hypothyroidism   . GERD (gastroesophageal reflux disease)   . Hypercholesterolemia   . Ulcer disease   . Diverticulosis   . Anemia     iron deficient  . B12 deficiency   . Fibrocystic breast disease   . Chronic kidney disease    Past Surgical History  Procedure Laterality Date  . Lap hiatal hernia repair  07/08/08  . Left oophorectomy  1993    benign ovarian cyst  . Tubal ligation  1981   Family History  Problem Relation Age of Onset  . Stroke Mother   . Breast cancer Neg Hx   . Colon cancer Neg Hx    Social History   Social History  . Marital Status: Widowed    Spouse Name: N/A  . Number of Children: 0  . Years of Education: N/A   Social History Main Topics  . Smoking status: Never Smoker   . Smokeless tobacco: Never Used  . Alcohol Use: No  . Drug Use: No  . Sexual Activity: Not Asked   Other Topics Concern  . None   Social History Narrative    Outpatient Encounter Prescriptions as of 01/11/2016  Medication Sig  . aspirin 81 MG tablet Take 81 mg by mouth daily.  Marland Kitchen atenolol (TENORMIN) 25 MG tablet Take 1 tablet (25 mg total) by mouth daily.  . benazepril (LOTENSIN) 20 MG tablet TAKE ONE (1) TABLET BY MOUTH EVERY DAY  . calcitonin, salmon, (MIACALCIN/FORTICAL) 200 UNIT/ACT nasal spray Place 1 spray into alternate nostrils daily. 1 spray once daily. Alternate nostrils.  .  Calcium Citrate (CITRACAL PO) Take 1,200 mg by mouth daily.  . Cholecalciferol (D3-1000 PO) Take by mouth daily.  . Cyanocobalamin (B-12) 1000 MCG/ML KIT Inject 1,000 Units as directed.  Marland Kitchen GLUCOSAMINE-CHONDROITIN PO Take 2 tablets by mouth daily.  Marland Kitchen levothyroxine (SYNTHROID, LEVOTHROID) 50 MCG tablet Take 1 tablet (50 mcg total) by mouth daily before breakfast.  . magnesium oxide (MAG-OX) 400 (241.3 MG) MG tablet TAKE ONE (1) TABLET EACH DAY  . Mesalamine (DELZICOL) 400 MG CPDR Take 400 mg by mouth 3 (three) times daily.  . pantoprazole (PROTONIX) 40 MG tablet Take 40 mg by mouth daily.   . Probiotic Product (ALIGN) 4 MG CAPS Take 1 capsule by mouth daily.  . Wheat Dextrin (BENEFIBER DRINK MIX) PACK Take by mouth.  . zolpidem (AMBIEN CR) 6.25 MG CR tablet Take 1 tablet (6.25 mg total) by mouth at bedtime as needed. for sleep  . [DISCONTINUED] zolpidem (AMBIEN CR) 6.25 MG CR tablet TAKE ONE TABLET BY MOUTH AT BEDTIME AS NEEDED FOR SLEEP   No facility-administered encounter medications on file as of 01/11/2016.    Review of Systems  Constitutional: Negative for appetite change and unexpected weight change.  HENT: Negative for congestion and sinus pressure.  Respiratory: Negative for cough, chest tightness and shortness of breath.   Cardiovascular: Negative for chest pain, palpitations and leg swelling.  Gastrointestinal: Negative for nausea, vomiting and abdominal pain.  Genitourinary: Negative for dysuria and difficulty urinating.  Musculoskeletal: Negative for back pain and joint swelling.  Skin: Negative for color change and rash.  Neurological: Negative for dizziness, light-headedness and headaches.  Psychiatric/Behavioral: Negative for dysphoric mood and agitation.       Objective:    Physical Exam  Constitutional: She appears well-developed and well-nourished. No distress.  HENT:  Nose: Nose normal.  Mouth/Throat: Oropharynx is clear and moist.  Neck: Neck supple. No  thyromegaly present.  Cardiovascular: Normal rate and regular rhythm.   Pulmonary/Chest: Breath sounds normal. No respiratory distress. She has no wheezes.  Abdominal: Soft. Bowel sounds are normal. There is no tenderness.  Musculoskeletal: She exhibits no edema or tenderness.  Lymphadenopathy:    She has no cervical adenopathy.  Skin: No rash noted. No erythema.  Psychiatric: She has a normal mood and affect. Her behavior is normal.    BP 120/70 mmHg  Pulse 58  Temp(Src) 97.7 F (36.5 C) (Oral)  Resp 18  Ht 5' (1.524 m)  Wt 159 lb 6 oz (72.292 kg)  BMI 31.13 kg/m2  SpO2 94% Wt Readings from Last 3 Encounters:  01/11/16 159 lb 6 oz (72.292 kg)  12/19/15 154 lb (69.854 kg)  12/01/15 162 lb 0.6 oz (73.5 kg)     Lab Results  Component Value Date   WBC 3.8 11/23/2015   HGB 12.5 11/23/2015   HCT 37.5 11/23/2015   PLT 180 11/23/2015   GLUCOSE 102* 01/11/2016   CHOL 151 09/07/2015   TRIG 91.0 09/07/2015   HDL 40.10 09/07/2015   LDLCALC 92 09/07/2015   ALT 17 01/11/2016   AST 26 01/11/2016   NA 137 01/11/2016   K 4.7 01/11/2016   CL 105 01/11/2016   CREATININE 0.97 01/11/2016   BUN 35* 01/11/2016   CO2 26 01/11/2016   TSH 1.38 01/11/2016   INR 1.03 05/13/2015   HGBA1C 5.9 01/11/2016       Assessment & Plan:   Problem List Items Addressed This Visit    Abnormal liver function tests    Seeing Dr Gustavo Lah.  S/p liver biopsy.  Per his note, diagnosed with autoimmune hepatitis.  Follow with GI.        Relevant Orders   Hepatic function panel (Completed)   Anemia    History of iron deficiency and B12 deficiency.  MGUS.  Followed by hematology.        Barrett's esophagus with esophagitis    Upper symptoms controlled on current regimen.  Follow.        Hypertension - Primary    Blood pressure under good control.  Continue same medication regimen.  Follow pressures.  Follow metabolic panel.        Relevant Orders   Basic metabolic panel (Completed)    Hypothyroidism    On thyroid replacement.  Follow tsh.        Relevant Orders   TSH (Completed)   MGUS (monoclonal gammopathy of unknown significance)    Seeing Dr Grayland Ormond.        Osteopenia   Renal insufficiency    Stay hydrated.  Follow renal function.   Lab Results  Component Value Date   CREATININE 0.97 01/11/2016         Other Visit Diagnoses    Hyperglycemia  Relevant Orders    Hemoglobin A1c (Completed)        Einar Pheasant, MD

## 2016-01-12 ENCOUNTER — Encounter: Payer: Self-pay | Admitting: Internal Medicine

## 2016-01-23 ENCOUNTER — Encounter: Payer: Self-pay | Admitting: Internal Medicine

## 2016-01-23 NOTE — Assessment & Plan Note (Signed)
Seeing Dr Gustavo Lah.  S/p liver biopsy.  Per his note, diagnosed with autoimmune hepatitis.  Follow with GI.

## 2016-01-23 NOTE — Assessment & Plan Note (Signed)
On thyroid replacement.  Follow tsh.  

## 2016-01-23 NOTE — Assessment & Plan Note (Signed)
History of iron deficiency and B12 deficiency.  MGUS.  Followed by hematology.

## 2016-01-23 NOTE — Assessment & Plan Note (Signed)
Blood pressure under good control.  Continue same medication regimen.  Follow pressures.  Follow metabolic panel.   

## 2016-01-23 NOTE — Assessment & Plan Note (Signed)
Upper symptoms controlled on current regimen.  Follow.   

## 2016-01-23 NOTE — Assessment & Plan Note (Signed)
Seeing Dr Grayland Ormond.

## 2016-01-23 NOTE — Assessment & Plan Note (Signed)
Stay hydrated.  Follow renal function.   Lab Results  Component Value Date   CREATININE 0.97 01/11/2016

## 2016-01-27 ENCOUNTER — Encounter: Payer: Self-pay | Admitting: *Deleted

## 2016-01-30 ENCOUNTER — Ambulatory Visit: Payer: Medicare Other | Admitting: Anesthesiology

## 2016-01-30 ENCOUNTER — Encounter: Payer: Self-pay | Admitting: *Deleted

## 2016-01-30 ENCOUNTER — Ambulatory Visit
Admission: RE | Admit: 2016-01-30 | Discharge: 2016-01-30 | Disposition: A | Discharge status: Home / Self Care | Payer: Medicare Other | Source: Ambulatory Visit | Attending: Gastroenterology | Admitting: Gastroenterology

## 2016-01-30 ENCOUNTER — Encounter
Admission: RE | Disposition: A | Discharge status: Home / Self Care | Payer: Self-pay | Source: Ambulatory Visit | Attending: Gastroenterology

## 2016-01-30 DIAGNOSIS — E039 Hypothyroidism, unspecified: Secondary | ICD-10-CM | POA: Insufficient documentation

## 2016-01-30 DIAGNOSIS — E538 Deficiency of other specified B group vitamins: Secondary | ICD-10-CM | POA: Insufficient documentation

## 2016-01-30 DIAGNOSIS — K2270 Barrett's esophagus without dysplasia: Secondary | ICD-10-CM | POA: Insufficient documentation

## 2016-01-30 DIAGNOSIS — K2970 Gastritis, unspecified, without bleeding: Secondary | ICD-10-CM | POA: Diagnosis not present

## 2016-01-30 DIAGNOSIS — K210 Gastro-esophageal reflux disease with esophagitis: Secondary | ICD-10-CM | POA: Insufficient documentation

## 2016-01-30 DIAGNOSIS — Z79899 Other long term (current) drug therapy: Secondary | ICD-10-CM | POA: Insufficient documentation

## 2016-01-30 DIAGNOSIS — Z8711 Personal history of peptic ulcer disease: Secondary | ICD-10-CM | POA: Insufficient documentation

## 2016-01-30 DIAGNOSIS — K3189 Other diseases of stomach and duodenum: Secondary | ICD-10-CM | POA: Diagnosis not present

## 2016-01-30 DIAGNOSIS — I8500 Esophageal varices without bleeding: Secondary | ICD-10-CM | POA: Diagnosis not present

## 2016-01-30 DIAGNOSIS — K766 Portal hypertension: Secondary | ICD-10-CM | POA: Diagnosis not present

## 2016-01-30 DIAGNOSIS — E7800 Pure hypercholesterolemia, unspecified: Secondary | ICD-10-CM | POA: Insufficient documentation

## 2016-01-30 DIAGNOSIS — Z7982 Long term (current) use of aspirin: Secondary | ICD-10-CM | POA: Insufficient documentation

## 2016-01-30 DIAGNOSIS — D509 Iron deficiency anemia, unspecified: Secondary | ICD-10-CM | POA: Diagnosis not present

## 2016-01-30 DIAGNOSIS — I129 Hypertensive chronic kidney disease with stage 1 through stage 4 chronic kidney disease, or unspecified chronic kidney disease: Secondary | ICD-10-CM | POA: Diagnosis not present

## 2016-01-30 DIAGNOSIS — N6019 Diffuse cystic mastopathy of unspecified breast: Secondary | ICD-10-CM | POA: Insufficient documentation

## 2016-01-30 DIAGNOSIS — N189 Chronic kidney disease, unspecified: Secondary | ICD-10-CM | POA: Diagnosis not present

## 2016-01-30 DIAGNOSIS — Z888 Allergy status to other drugs, medicaments and biological substances status: Secondary | ICD-10-CM | POA: Diagnosis not present

## 2016-01-30 DIAGNOSIS — D649 Anemia, unspecified: Secondary | ICD-10-CM | POA: Diagnosis not present

## 2016-01-30 DIAGNOSIS — Z880 Allergy status to penicillin: Secondary | ICD-10-CM | POA: Diagnosis not present

## 2016-01-30 DIAGNOSIS — K449 Diaphragmatic hernia without obstruction or gangrene: Secondary | ICD-10-CM | POA: Diagnosis not present

## 2016-01-30 HISTORY — PX: ESOPHAGOGASTRODUODENOSCOPY (EGD) WITH PROPOFOL: SHX5813

## 2016-01-30 SURGERY — ESOPHAGOGASTRODUODENOSCOPY (EGD) WITH PROPOFOL
Anesthesia: General

## 2016-01-30 MED ORDER — SODIUM CHLORIDE 0.9 % IV SOLN
INTRAVENOUS | Status: DC
  Administered 2016-01-30 (×2): via INTRAVENOUS

## 2016-01-30 MED ORDER — EPHEDRINE SULFATE 50 MG/ML IJ SOLN
INTRAMUSCULAR | Status: DC | PRN
  Administered 2016-01-30: 10 mg via INTRAVENOUS

## 2016-01-30 MED ORDER — PROPOFOL 10 MG/ML IV BOLUS
INTRAVENOUS | Status: DC | PRN
  Administered 2016-01-30: 10 mg via INTRAVENOUS
  Administered 2016-01-30: 40 mg via INTRAVENOUS

## 2016-01-30 MED ORDER — SODIUM CHLORIDE 0.9 % IV SOLN: INTRAVENOUS | Status: DC

## 2016-01-30 MED ORDER — MIDAZOLAM HCL 5 MG/5ML IJ SOLN
INTRAMUSCULAR | Status: DC | PRN
  Administered 2016-01-30: 1 mg via INTRAVENOUS

## 2016-01-30 MED ORDER — FENTANYL CITRATE (PF) 100 MCG/2ML IJ SOLN
INTRAMUSCULAR | Status: DC | PRN
  Administered 2016-01-30: 50 ug via INTRAVENOUS

## 2016-01-30 MED ORDER — LIDOCAINE HCL (CARDIAC) 20 MG/ML IV SOLN
INTRAVENOUS | Status: DC | PRN
  Administered 2016-01-30: 100 mg via INTRAVENOUS

## 2016-01-30 NOTE — Transfer of Care (Signed)
Immediate Anesthesia Transfer of Care Note  Patient: Lori [name]  Procedure(s) Performed: Procedure(s): ESOPHAGOGASTRODUODENOSCOPY (EGD) WITH PROPOFOL (N/A)  Patient Location: PACU  Anesthesia Type:General  Level of Consciousness: awake  Airway & Oxygen Therapy: Patient Spontanous Breathing and Patient connected to nasal cannula oxygen  Post-op Assessment: Report given to RN and Post -op Vital signs reviewed and stable  Post vital signs: Reviewed and stable  Last Vitals:  Filed Vitals:   01/30/16 0703  BP: 157/71  Pulse: 63  Temp: 36.3 C  Resp: 16    Complications: No apparent anesthesia complications

## 2016-01-30 NOTE — Anesthesia Preprocedure Evaluation (Signed)
Anesthesia Evaluation  Patient identified by MRN, date of birth, ID band Patient awake    Reviewed: Allergy & Precautions, NPO status , Patient's Chart, lab work & pertinent test results, reviewed documented beta blocker date and time   Airway Mallampati: II  TM Distance: >3 FB     Dental  (+) Chipped   Pulmonary           Cardiovascular hypertension, Pt. on medications and Pt. on home beta blockers      Neuro/Psych    GI/Hepatic GERD  ,  Endo/Other  Hypothyroidism   Renal/GU Renal InsufficiencyRenal disease     Musculoskeletal   Abdominal   Peds  Hematology  (+) anemia ,   Anesthesia Other Findings   Reproductive/Obstetrics                             Anesthesia Physical Anesthesia Plan  ASA: III  Anesthesia Plan: General   Post-op Pain Management:    Induction:   Airway Management Planned: Nasal Cannula  Additional Equipment:   Intra-op Plan:   Post-operative Plan:   Informed Consent: I have reviewed the patients History and Physical, chart, labs and discussed the procedure including the risks, benefits and alternatives for the proposed anesthesia with the patient or authorized representative who has indicated his/her understanding and acceptance.     Plan Discussed with: CRNA  Anesthesia Plan Comments:         Anesthesia Quick Evaluation

## 2016-01-30 NOTE — Anesthesia Postprocedure Evaluation (Signed)
Anesthesia Post Note  Patient: Lori [name]  Procedure(s) Performed: Procedure(s) (LRB): ESOPHAGOGASTRODUODENOSCOPY (EGD) WITH PROPOFOL (N/A)  Patient location during evaluation: Endoscopy Anesthesia Type: General Level of consciousness: awake Pain management: pain level controlled Vital Signs Assessment: post-procedure vital signs reviewed and stable Respiratory status: spontaneous breathing Cardiovascular status: blood pressure returned to baseline Anesthetic complications: no    Last Vitals:  Filed Vitals:   01/30/16 0830 01/30/16 0840  BP: 98/49 115/61  Pulse: 58 49  Temp:    Resp: 14 20    Last Pain: There were no vitals filed for this visit.               Jasper Ruminski S

## 2016-01-30 NOTE — H&P (Signed)
Outpatient short stay form Pre-procedure 01/30/2016 7:22 AM Lollie Sails MD  Primary Physician: Einar Pheasant, M.D.  Reason for visit:  EGD  History of present illness:  Patient is a 82 year old female presenting today for EGD in regards to her history of long segment Barrett's esophagus. She has no problems with heartburn or dysphagia. She has been taking a proton pump inhibitor. He is not taking any aspirin or blood thinning products. She does take a 81 mg aspirin which she last took over week ago.    Current facility-administered medications:  .  0.9 %  sodium chloride infusion, , Intravenous, Continuous, Lollie Sails, MD, Last Rate: 20 mL/hr at 01/30/16 0714 .  0.9 %  sodium chloride infusion, , Intravenous, Continuous, Lollie Sails, MD  Prescriptions prior to admission  Medication Sig Dispense Refill Last Dose  . aspirin 81 MG tablet Take 81 mg by mouth daily.   Past Week at Unknown time  . atenolol (TENORMIN) 25 MG tablet Take 1 tablet (25 mg total) by mouth daily. 90 tablet 3 01/30/2016 at 0530  . benazepril (LOTENSIN) 20 MG tablet TAKE ONE (1) TABLET BY MOUTH EVERY DAY 90 tablet 1 01/30/2016 at 0530  . calcitonin, salmon, (MIACALCIN/FORTICAL) 200 UNIT/ACT nasal spray Place 1 spray into alternate nostrils daily. 1 spray once daily. Alternate nostrils. 3.7 mL 11 Taking  . Calcium Citrate (CITRACAL PO) Take 1,200 mg by mouth daily.   Taking  . Cholecalciferol (D3-1000 PO) Take by mouth daily.   Taking  . Cyanocobalamin (B-12) 1000 MCG/ML KIT Inject 1,000 Units as directed.   Taking  . GLUCOSAMINE-CHONDROITIN PO Take 2 tablets by mouth daily.   Taking  . levothyroxine (SYNTHROID, LEVOTHROID) 50 MCG tablet Take 1 tablet (50 mcg total) by mouth daily before breakfast. 90 tablet 3 Taking  . magnesium oxide (MAG-OX) 400 (241.3 MG) MG tablet TAKE ONE (1) TABLET EACH DAY 30 tablet 5 Taking  . Mesalamine (DELZICOL) 400 MG CPDR Take 400 mg by mouth 3 (three) times daily.   Taking   . pantoprazole (PROTONIX) 40 MG tablet Take 40 mg by mouth daily.    Taking  . Probiotic Product (ALIGN) 4 MG CAPS Take 1 capsule by mouth daily.   Taking  . Wheat Dextrin (BENEFIBER DRINK MIX) PACK Take by mouth.   Taking  . zolpidem (AMBIEN CR) 6.25 MG CR tablet Take 1 tablet (6.25 mg total) by mouth at bedtime as needed. for sleep 30 tablet 2      Allergies  Allergen Reactions  . Penicillins   . Daypro [Oxaprozin] Rash  . Lodine [Etodolac] Rash     Past Medical History  Diagnosis Date  . Hypertension   . Hypothyroidism   . GERD (gastroesophageal reflux disease)   . Hypercholesterolemia   . Ulcer disease   . Diverticulosis   . Anemia     iron deficient  . B12 deficiency   . Fibrocystic breast disease   . Chronic kidney disease     Review of systems:      Physical Exam    Heart and lungs: Regular rate and rhythm without rub or gallop, lungs are bilaterally clear.    HEENT: Normocephalic atraumatic eyes are anicteric    Other:     Pertinant exam for procedure: Soft nontender nondistended bowel sounds positive normoactive    Planned proceedures: EGD and indicated procedures. I have discussed the risks benefits and complications of procedures to include not limited to bleeding, infection, perforation and the  risk of sedation and the patient wishes to proceed.    Lollie Sails, MD Gastroenterology 01/30/2016  7:22 AM

## 2016-01-30 NOTE — Anesthesia Procedure Notes (Signed)
Date/Time: 01/30/2016 7:45 AM Performed by: Allean Found Pre-anesthesia Checklist: Patient identified, Emergency Drugs available, Suction available, Patient being monitored and Timeout performed Patient Re-evaluated:Patient Re-evaluated prior to inductionOxygen Delivery Method: Nasal cannula

## 2016-01-30 NOTE — Op Note (Signed)
[facility] Gastroenterology Patient Name: Lori [name] Procedure Date: 01/30/2016 7:28 AM MRN: [id number] Account #: [id number] Date of Birth: June 17, 1934 Admit Type: Outpatient Age: 82 Room: [facility] ENDO ROOM 3 Gender: Female Note Status: Finalized Procedure:         Upper GI endoscopy Indications:       Follow-up of Barrett's esophagus Providers:         Lollie Sails, MD Referring MD:      Einar Pheasant, MD (Referring MD) Medicines:         Monitored Anesthesia Care Complications:     No immediate complications. Procedure:         Pre-Anesthesia Assessment:                    - ASA Grade Assessment: III - A patient with severe                     systemic disease.                    After obtaining informed consent, the endoscope was passed                     under direct vision. Throughout the procedure, the                     patient's blood pressure, pulse, and oxygen saturations                     were monitored continuously. The Endoscope was introduced                     through the mouth, and advanced to the third part of                     duodenum. The upper GI endoscopy was accomplished without                     difficulty. The patient tolerated the procedure well. Findings:      There were esophageal mucosal changes secondary to established       long-segment Barrett's disease present in the lower third of the       esophagus. The maximum longitudinal extent of these mucosal changes was       6 cm in length. There were Grade 1 varices noted in the lower third       esophagus, relatively deep to the mucosa, though present subtley in the       lower third. Directed biopsies were taken from 26 and 28 cm from the       incisors. Hemostasis was good.      A small hiatus hernia was present.      Patchy mild inflammation characterized by erythema was found in the       gastric body.      The exam of the stomach was otherwise normal.      The  cardia and gastric fundus were normal on retroflexion.      The examined duodenum was normal.      Small (< 5 mm) varices were found in the lower third of the esophagus. Impression:        - Esophageal mucosal changes secondary to established                     long-segment  Barrett's disease.                    - Small hiatus hernia.                    - Gastritis.                    - Normal examined duodenum.                    - No specimens collected. Recommendation:    - Discharge patient to home.                    - Continue present medications.                    - Await pathology results.                    - Return to GI clinic in 4 weeks. Procedure Code(s): --- Professional ---                    [phone], Esophagogastroduodenoscopy, flexible, transoral;                     diagnostic, including collection of specimen(s) by                     brushing or washing, when performed (separate procedure) Diagnosis Code(s): --- Professional ---                    K22.70, Barrett's esophagus without dysplasia                    K44.9, Diaphragmatic hernia without obstruction or gangrene                    K29.70, Gastritis, unspecified, without bleeding CPT copyright 2014 [employer]. All rights reserved. The codes documented in this report are preliminary and upon coder review may  be revised to meet current compliance requirements. Lollie Sails, MD 01/30/2016 8:07:42 AM This report has been signed electronically. Number of Addenda: 0 Note Initiated On: 01/30/2016 7:28 AM      [facility]

## 2016-01-31 ENCOUNTER — Encounter: Payer: Self-pay | Admitting: Gastroenterology

## 2016-01-31 ENCOUNTER — Ambulatory Visit (INDEPENDENT_AMBULATORY_CARE_PROVIDER_SITE_OTHER): Payer: Medicare Other | Admitting: *Deleted

## 2016-01-31 DIAGNOSIS — E538 Deficiency of other specified B group vitamins: Secondary | ICD-10-CM

## 2016-01-31 LAB — SURGICAL PATHOLOGY

## 2016-01-31 MED ORDER — CYANOCOBALAMIN 1000 MCG/ML IJ SOLN
1000.0000 ug | Freq: Once | INTRAMUSCULAR | Status: AC
  Administered 2016-01-31: 1000 ug via INTRAMUSCULAR

## 2016-01-31 NOTE — Progress Notes (Signed)
Patient presented for B 12 injection to right deltoid and tolerated well.

## 2016-02-21 ENCOUNTER — Other Ambulatory Visit: Payer: Self-pay | Admitting: Internal Medicine

## 2016-02-22 ENCOUNTER — Other Ambulatory Visit: Payer: Self-pay | Admitting: Gastroenterology

## 2016-02-22 DIAGNOSIS — K754 Autoimmune hepatitis: Secondary | ICD-10-CM

## 2016-03-01 ENCOUNTER — Ambulatory Visit
Admission: RE | Admit: 2016-03-01 | Discharge: 2016-03-01 | Disposition: A | Discharge status: Home / Self Care | Payer: Medicare Other | Source: Ambulatory Visit | Attending: Gastroenterology | Admitting: Gastroenterology

## 2016-03-01 DIAGNOSIS — K754 Autoimmune hepatitis: Secondary | ICD-10-CM | POA: Insufficient documentation

## 2016-03-01 DIAGNOSIS — K760 Fatty (change of) liver, not elsewhere classified: Secondary | ICD-10-CM | POA: Diagnosis not present

## 2016-03-07 ENCOUNTER — Ambulatory Visit (INDEPENDENT_AMBULATORY_CARE_PROVIDER_SITE_OTHER): Payer: Medicare Other | Admitting: *Deleted

## 2016-03-07 DIAGNOSIS — E538 Deficiency of other specified B group vitamins: Secondary | ICD-10-CM | POA: Diagnosis not present

## 2016-03-07 MED ORDER — CYANOCOBALAMIN 1000 MCG/ML IJ SOLN
1000.0000 ug | Freq: Once | INTRAMUSCULAR | Status: AC
  Administered 2016-03-07: 1000 ug via INTRAMUSCULAR

## 2016-03-16 ENCOUNTER — Ambulatory Visit (INDEPENDENT_AMBULATORY_CARE_PROVIDER_SITE_OTHER): Payer: Medicare Other | Admitting: Internal Medicine

## 2016-03-16 ENCOUNTER — Telehealth: Payer: Self-pay | Admitting: Internal Medicine

## 2016-03-16 ENCOUNTER — Encounter: Payer: Self-pay | Admitting: Internal Medicine

## 2016-03-16 VITALS — BP 120/60 | HR 62 | Temp 98.4°F | Resp 14 | Ht 60.0 in | Wt 158.8 lb

## 2016-03-16 DIAGNOSIS — R945 Abnormal results of liver function studies: Secondary | ICD-10-CM

## 2016-03-16 DIAGNOSIS — J069 Acute upper respiratory infection, unspecified: Secondary | ICD-10-CM

## 2016-03-16 DIAGNOSIS — R7989 Other specified abnormal findings of blood chemistry: Secondary | ICD-10-CM | POA: Diagnosis not present

## 2016-03-16 MED ORDER — AZITHROMYCIN 250 MG PO TABS: ORAL_TABLET | ORAL | Status: DC

## 2016-03-16 NOTE — Telephone Encounter (Signed)
Pt called stating she started out with coughing and she took OTC medication and coughing went away. Now she is congested. Pt would like for Dr Nicki Reaper to call something in. I advised Dr Nicki Reaper probably would need to see her. I also suggested for her to see another provider. Pt wants something called in. Pt last OV was 01/11/2016 F/U. No Fever. Call pt @ [phone]. Pharmacy is [facility], [place] . Thank you!

## 2016-03-16 NOTE — Progress Notes (Signed)
Patient ID: Lori [name], female   DOB: 02/24/1934, 82 y.o.   MRN: [id number]   Subjective:    Patient ID: Lori [name], female    DOB: January 06, 1934, 82 y.o.   MRN: [id number]  HPI  Patient here as a work in with concerns regarding increased nasal congestion and cough.  States symptoms started several days ago.  Started with sore throat.  Increased cough.  Sinus pressure.  Increased mucus production.  Left ear feels full.  Some throat congestion.  No deep chest congestion.  No vomiting.  No diarrhea.  Eating.     Past Medical History  Diagnosis Date  . Hypertension   . Hypothyroidism   . GERD (gastroesophageal reflux disease)   . Hypercholesterolemia   . Ulcer disease   . Diverticulosis   . Anemia     iron deficient  . B12 deficiency   . Fibrocystic breast disease   . Chronic kidney disease    Past Surgical History  Procedure Laterality Date  . Lap hiatal hernia repair  07/08/08  . Left oophorectomy  1993    benign ovarian cyst  . Tubal ligation  1981  . Hernia repair    . Esophagogastroduodenoscopy (egd) with propofol N/A 01/30/2016    Procedure: ESOPHAGOGASTRODUODENOSCOPY (EGD) WITH PROPOFOL;  Surgeon: Lollie Sails, MD;  Location[facility] ENDOSCOPY;  Service: Endoscopy;  Laterality: N/A;   Family History  Problem Relation Age of Onset  . Stroke Mother   . Breast cancer Neg Hx   . Colon cancer Neg Hx    Social History   Social History  . Marital Status: Widowed    Spouse Name: N/A  . Number of Children: 0  . Years of Education: N/A   Social History Main Topics  . Smoking status: Never Smoker   . Smokeless tobacco: Never Used  . Alcohol Use: No  . Drug Use: No  . Sexual Activity: Not Asked   Other Topics Concern  . None   Social History Narrative     Review of Systems  Constitutional: Negative for appetite change and unexpected weight change.  HENT: Positive for congestion, postnasal drip and sinus pressure.   Eyes: Negative for discharge  and redness.  Respiratory: Positive for cough. Negative for chest tightness and shortness of breath.   Cardiovascular: Negative for chest pain, palpitations and leg swelling.  Gastrointestinal: Negative for nausea, vomiting, abdominal pain and diarrhea.  Skin: Negative for color change and rash.  Neurological: Negative for dizziness, light-headedness and headaches.       Objective:    Physical Exam  Constitutional: She appears well-developed and well-nourished. No distress.  HENT:  Mouth/Throat: Oropharynx is clear and moist.  Nares - slightly erythematous turbinates.  TMs no increased erythema.    Eyes: Conjunctivae are normal. Right eye exhibits no discharge. Left eye exhibits no discharge.  Neck: Neck supple.  Cardiovascular: Normal rate and regular rhythm.   Pulmonary/Chest: Breath sounds normal. No respiratory distress. She has no wheezes.  Lymphadenopathy:    She has no cervical adenopathy.    BP 120/60 mmHg  Pulse 62  Temp(Src) 98.4 F (36.9 C) (Oral)  Resp 14  Ht 5' (1.524 m)  Wt 158 lb 12.8 oz (72.031 kg)  BMI 31.01 kg/m2  SpO2 95% Wt Readings from Last 3 Encounters:  03/16/16 158 lb 12.8 oz (72.031 kg)  01/30/16 157 lb (71.215 kg)  01/11/16 159 lb 6 oz (72.292 kg)     Lab Results  Component Value Date  WBC 3.8 11/23/2015   HGB 12.5 11/23/2015   HCT 37.5 11/23/2015   PLT 180 11/23/2015   GLUCOSE 102* 01/11/2016   CHOL 151 09/07/2015   TRIG 91.0 09/07/2015   HDL 40.10 09/07/2015   LDLCALC 92 09/07/2015   ALT 17 01/11/2016   AST 26 01/11/2016   NA 137 01/11/2016   K 4.7 01/11/2016   CL 105 01/11/2016   CREATININE 0.97 01/11/2016   BUN 35* 01/11/2016   CO2 26 01/11/2016   TSH 1.38 01/11/2016   INR 1.03 05/13/2015   HGBA1C 5.9 01/11/2016    US Abdomen Complete  03/01/2016  CLINICAL DATA:  Autoimmune hepatitis. EXAM: ULTRASOUND ABDOMEN COMPLETE ULTRASOUND HEPATIC ELASTOGRAPHY TECHNIQUE: Sonography of the upper abdomen was performed. In addition,  ultrasound elastography evaluation of the liver was performed. A region of interest was placed within the right lobe of the liver. Following application of a compressive sonographic pulse, shear waves were detected in the adjacent hepatic tissue and the shear wave velocity was calculated. Multiple assessments were performed at the selected site. Median shear wave velocity is correlated to a Metavir fibrosis score. COMPARISON:  12/09/2014 FINDINGS: ULTRASOUND ABDOMEN Gallbladder: Gallbladder wall is normal in thickness, 2.4 mm. No sonographic Murphy's sign. Mobile sludge is identified within the gallbladder, forming a sludge ball which measures 2.8 x 0.6 x 1.5 cm. No definite stones. Common bile duct: Diameter: 7.9 mm Liver: Mildly increased echogenicity. Other features of hepatic steatosis are absent. No focal liver lesions are identified. IVC: No abnormality visualized. Pancreas: Visualized portion unremarkable. Spleen: Size and appearance within normal limits. Right Kidney: Length: 9.9 cm. Echogenicity within normal limits. No mass or hydronephrosis visualized. Left Kidney: Length: 10.5 cm. Echogenicity within normal limits. No mass or hydronephrosis visualized. Abdominal aorta: No aneurysm visualized. Other findings: None. ULTRASOUND HEPATIC ELASTOGRAPHY Device: Siemens Helix VTQ Patient position:  Supine Transducer 6 C1 Number of measurements:  10 Hepatic Segment:  7 Median velocity:   3.09  m/sec IQR: 0.5 IQR/Median velocity ratio 0.162 Corresponding Metavir fibrosis score:  Some F3 and F4 Risk of fibrosis: High Limitations of exam: None Pertinent findings noted on other imaging exams:  None Please note that abnormal shear wave velocities may also be identified in clinical settings other than with hepatic fibrosis, such as: acute hepatitis, elevated right heart and central venous pressures including use of beta blockers, veno-occlusive disease (Budd-Chiari), infiltrative processes such as  mastocytosis/amyloidosis/infiltrative tumor, extrahepatic cholestasis, in the post-prandial state, and liver transplantation. Correlation with patient history, laboratory data, and clinical condition recommended. IMPRESSION: 1. Sludge ball within the gallbladder. No evidence for acute cholecystitis. 2. Mildly echogenic liver without other features of hepatic steatosis. 3. Median hepatic shear wave velocity is calculated at 3.09 m/sec. 4. Corresponding Metavir fibrosis score is some F3 and F4. 5. Risk of fibrosis is high. 6. Follow-up:  Advised Electronically Signed   By: Nolon Nations M.D.   On: 03/01/2016 09:56   [place] Arfi Elastography Add On  03/01/2016  CLINICAL DATA:  Autoimmune hepatitis. EXAM: ULTRASOUND ABDOMEN COMPLETE ULTRASOUND HEPATIC ELASTOGRAPHY TECHNIQUE: Sonography of the upper abdomen was performed. In addition, ultrasound elastography evaluation of the liver was performed. A region of interest was placed within the right lobe of the liver. Following application of a compressive sonographic pulse, shear waves were detected in the adjacent hepatic tissue and the shear wave velocity was calculated. Multiple assessments were performed at the selected site. Median shear wave velocity is correlated to a Metavir fibrosis score. COMPARISON:  12/09/2014 FINDINGS:  ULTRASOUND ABDOMEN Gallbladder: Gallbladder wall is normal in thickness, 2.4 mm. No sonographic Murphy's sign. Mobile sludge is identified within the gallbladder, forming a sludge ball which measures 2.8 x 0.6 x 1.5 cm. No definite stones. Common bile duct: Diameter: 7.9 mm Liver: Mildly increased echogenicity. Other features of hepatic steatosis are absent. No focal liver lesions are identified. IVC: No abnormality visualized. Pancreas: Visualized portion unremarkable. Spleen: Size and appearance within normal limits. Right Kidney: Length: 9.9 cm. Echogenicity within normal limits. No mass or hydronephrosis visualized. Left Kidney: Length: 10.5  cm. Echogenicity within normal limits. No mass or hydronephrosis visualized. Abdominal aorta: No aneurysm visualized. Other findings: None. ULTRASOUND HEPATIC ELASTOGRAPHY Device: Siemens Helix VTQ Patient position:  Supine Transducer 6 C1 Number of measurements:  10 Hepatic Segment:  7 Median velocity:   3.09  m/sec IQR: 0.5 IQR/Median velocity ratio 0.162 Corresponding Metavir fibrosis score:  Some F3 and F4 Risk of fibrosis: High Limitations of exam: None Pertinent findings noted on other imaging exams:  None Please note that abnormal shear wave velocities may also be identified in clinical settings other than with hepatic fibrosis, such as: acute hepatitis, elevated right heart and central venous pressures including use of beta blockers, veno-occlusive disease (Budd-Chiari), infiltrative processes such as mastocytosis/amyloidosis/infiltrative tumor, extrahepatic cholestasis, in the post-prandial state, and liver transplantation. Correlation with patient history, laboratory data, and clinical condition recommended. IMPRESSION: 1. Sludge ball within the gallbladder. No evidence for acute cholecystitis. 2. Mildly echogenic liver without other features of hepatic steatosis. 3. Median hepatic shear wave velocity is calculated at 3.09 m/sec. 4. Corresponding Metavir fibrosis score is some F3 and F4. 5. Risk of fibrosis is high. 6. Follow-up:  Advised Electronically Signed   By: Nolon Nations M.D.   On: 03/01/2016 09:56   [place] Art/ven Flow Abd Pelv Doppler  03/01/2016  CLINICAL DATA:  Autoimmune hepatitis EXAM: DUPLEX ULTRASOUND OF LIVER TECHNIQUE: Color and duplex Doppler ultrasound was performed to evaluate the hepatic in-flow and out-flow vessels. COMPARISON:  None. FINDINGS: Portal Vein Velocities Main:  42 cm/sec Right:  25 cm/sec Left:  32 cm/sec Hepatic Vein Velocities Right:  21 cm/sec Middle:  29 cm/sec Left:  47 cm/sec Hepatic Artery Velocity:  77 cm/sec Splenic Vein Velocity:  18 cm/sec Varices: Absent  Ascites: Absent Portal vein and its branches have normal directionality of flow, hepatopetal. Hepatic artery Doppler waveform is low resistance. Hepatic veins are normal and directionality of flow with a normal-appearing venous waveform. Splenic vein is also patent with normal directionality of flow. The spleen is upper normal in size. IMPRESSION: Hepatic portal and splenic veins are all patent with normal directionality of flow. Electronically Signed   By: Marybelle Killings M.D.   On: 03/01/2016 10:20       Assessment & Plan:   Problem List Items Addressed This Visit    Abnormal liver function tests    Being worked up by GI.  Ultrasound/scannin as outlined.  Due f/u with GI next week.        URI (upper respiratory infection) - Primary    Symptoms and exam as outlined.  Saline nasal spray and nasacort nasal spray as directed.  Robitussin as directed.  Gave rx for zpak.  Only take if symptoms persists or worsen.  Follow.        Relevant Medications   azithromycin (ZITHROMAX) 250 MG tablet       Einar Pheasant, MD

## 2016-03-16 NOTE — Telephone Encounter (Signed)
Does pt need and OV? Please advise, thanks

## 2016-03-16 NOTE — Telephone Encounter (Signed)
Pt is coming in for appt.

## 2016-03-16 NOTE — Patient Instructions (Signed)
Saline nasal spray - flush nose at least 2-3x/day  nasacort nasal spray - 2 sprays each nostril one time per day.  Do this in the evening.   Robitussin - twice a day as needed.   

## 2016-03-16 NOTE — Telephone Encounter (Signed)
Per policy, pt will need to be seen before can treat.  I can see her at 4:30 today if she wants me to evaluate her.    Dr Nicki Reaper

## 2016-03-16 NOTE — Progress Notes (Signed)
Pre visit review using our clinic review tool, if applicable. No additional management support is needed unless otherwise documented below in the visit note. 

## 2016-03-18 ENCOUNTER — Encounter: Payer: Self-pay | Admitting: Internal Medicine

## 2016-03-18 DIAGNOSIS — J069 Acute upper respiratory infection, unspecified: Secondary | ICD-10-CM | POA: Insufficient documentation

## 2016-03-18 NOTE — Assessment & Plan Note (Signed)
Symptoms and exam as outlined.  Saline nasal spray and nasacort nasal spray as directed.  Robitussin as directed.  Gave rx for zpak.  Only take if symptoms persists or worsen.  Follow.

## 2016-03-18 NOTE — Assessment & Plan Note (Signed)
Being worked up by GI.  Ultrasound/scannin as outlined.  Due f/u with GI next week.

## 2016-03-21 ENCOUNTER — Ambulatory Visit: Payer: Medicare Other

## 2016-03-21 DIAGNOSIS — K754 Autoimmune hepatitis: Secondary | ICD-10-CM | POA: Diagnosis not present

## 2016-03-26 ENCOUNTER — Ambulatory Visit (INDEPENDENT_AMBULATORY_CARE_PROVIDER_SITE_OTHER): Payer: Medicare Other

## 2016-03-26 VITALS — BP 120/62 | HR 65 | Temp 98.3°F | Resp 14 | Ht 60.0 in | Wt 159.4 lb

## 2016-03-26 DIAGNOSIS — Z0000 Encounter for general adult medical examination without abnormal findings: Secondary | ICD-10-CM | POA: Diagnosis not present

## 2016-03-26 DIAGNOSIS — Z1239 Encounter for other screening for malignant neoplasm of breast: Secondary | ICD-10-CM

## 2016-03-26 NOTE — Progress Notes (Signed)
Subjective:   Lori [name] is a 82 y.o. female who presents for an Initial Medicare Annual Wellness Visit.  Review of Systems    No ROS.  Medicare Wellness Visit.  Cardiac Risk Factors include: advanced age (>32mn, >>46women);hypertension     Objective:    Today's Vitals   03/26/16 1300  BP: 120/62  Pulse: 65  Temp: 98.3 F (36.8 C)  TempSrc: Oral  Resp: 14  Height: 5' (1.524 m)  Weight: 159 lb 6.4 oz (72.303 kg)  SpO2: 97%   Body mass index is 31.13 kg/(m^2).   Current Medications (verified) Outpatient Encounter Prescriptions as of 03/26/2016  Medication Sig  . aspirin 81 MG tablet Take 81 mg by mouth daily.  .Marland Kitchenatenolol (TENORMIN) 25 MG tablet Take 1 tablet (25 mg total) by mouth daily.  . benazepril (LOTENSIN) 20 MG tablet TAKE ONE (1) TABLET BY MOUTH EVERY DAY  . calcitonin, salmon, (MIACALCIN/FORTICAL) 200 UNIT/ACT nasal spray Place 1 spray into alternate nostrils daily. 1 spray once daily. Alternate nostrils.  . Calcium Citrate (CITRACAL PO) Take 1,200 mg by mouth daily.  . Cholecalciferol (D3-1000 PO) Take by mouth daily.  . Cyanocobalamin (B-12) 1000 MCG/ML KIT Inject 1,000 Units as directed.  .Marland KitchenGLUCOSAMINE-CHONDROITIN PO Take 2 tablets by mouth daily.  .Marland Kitchenlevothyroxine (SYNTHROID, LEVOTHROID) 50 MCG tablet Take 1 tablet (50 mcg total) by mouth daily before breakfast.  . magnesium oxide (MAG-OX) 400 (241.3 MG) MG tablet TAKE ONE (1) TABLET EACH DAY  . Mesalamine (DELZICOL) 400 MG CPDR Take 400 mg by mouth 3 (three) times daily.  . pantoprazole (PROTONIX) 40 MG tablet Take 40 mg by mouth daily.   . Probiotic Product (ALIGN) 4 MG CAPS Take 1 capsule by mouth daily.  . Wheat Dextrin (BENEFIBER DRINK MIX) PACK Take by mouth.  . zolpidem (AMBIEN CR) 6.25 MG CR tablet Take 1 tablet (6.25 mg total) by mouth at bedtime as needed. for sleep  . triamcinolone (NASACORT) 55 MCG/ACT AERO nasal inhaler Place into the nose.  . [DISCONTINUED] azithromycin (ZITHROMAX)  250 MG tablet Take two tablets x 1 day and then one per day for four more days.   No facility-administered encounter medications on file as of 03/26/2016.    Allergies (verified) Penicillins; Daypro; and Lodine   History: Past Medical History  Diagnosis Date  . Hypertension   . Hypothyroidism   . GERD (gastroesophageal reflux disease)   . Hypercholesterolemia   . Ulcer disease   . Diverticulosis   . Anemia     iron deficient  . B12 deficiency   . Fibrocystic breast disease   . Chronic kidney disease    Past Surgical History  Procedure Laterality Date  . Lap hiatal hernia repair  07/08/08  . Left oophorectomy  1993    benign ovarian cyst  . Tubal ligation  1981  . Hernia repair    . Esophagogastroduodenoscopy (egd) with propofol N/A 01/30/2016    Procedure: ESOPHAGOGASTRODUODENOSCOPY (EGD) WITH PROPOFOL;  Surgeon: MLollie Sails MD;  [facility]  Service: Endoscopy;  Laterality: N/A;   Family History  Problem Relation Age of Onset  . Stroke Mother   . Breast cancer Neg Hx   . Colon cancer Neg Hx    Social History   Occupational History  . Not on file.   Social History Main Topics  . Smoking status: Never Smoker   . Smokeless tobacco: Never Used  . Alcohol Use: No  . Drug Use: No  .  Sexual Activity: No    Tobacco Counseling Counseling given: Not Answered   Activities of Daily Living In your present state of health, do you have any difficulty performing the following activities: 03/26/2016  Hearing? Y  Vision? N  Difficulty concentrating or making decisions? N  Walking or climbing stairs? N  Dressing or bathing? N  Doing errands, shopping? N  Preparing Food and eating ? N  Using the Toilet? N  In the past six months, have you accidently leaked urine? N  Do you have problems with loss of bowel control? Y  Managing your Medications? N  Managing your Finances? N  Housekeeping or managing your Housekeeping? N    Immunizations and Health  Maintenance Immunization History  Administered Date(s) Administered  . Influenza,inj,Quad PF,36+ Mos 08/31/2014, 09/07/2015  . Pneumococcal Conjugate-13 03/08/2014  . Pneumococcal Polysaccharide-23 07/22/2008  . Zoster 12/31/2008   Health Maintenance Due  Topic Date Due  . Samul Dada  10/01/1953    Patient Care Team: Einar Pheasant, MD as PCP - General (Internal Medicine)  Indicate any recent Medical Services you may have received from other than [facility] providers in the past year (date may be approximate).     Assessment:   This is a routine wellness examination for [place].  The goal of the wellness visit is to assist the patient how to close the gaps in care and create a preventative care plan for the patient.   Taking VIT D 3 calcium as appropriate/Osteoporosis risk reviewed.  Medications reviewed; taking without issues or barriers.  Safety issues reviewed; smoke and carbon monoxide detectors in the home. No firearms in the home. Wears seatbelts when driving or riding with others. No violence in the home.  No identified risk were noted; The patient was oriented x 3; appropriate in dress and manner and no objective failures at ADL's or IADL's.   TDAP vaccine postponed for insurance follow up, per patient request.   Patient Concerns:  None at this time. Follow up with PCP as needed.  Hearing/Vision screen Hearing Screening Comments: Followed by [facility] Semi-annual visits  Vision Screening Comments: Followed  By [facility] Wears contacts/glasses Annual visits Last OV 10/2015  Dietary issues and exercise activities discussed: Current Exercise Habits: Home exercise routine, Type of exercise: walking, Time (Minutes): 30, Frequency (Times/Week): 5, Weekly Exercise (Minutes/Week): 150, Intensity: Mild  Goals    . Healthy Lifestyle     Increase water intake, stay hydrated. Low carb foods.  Lean meats, fruits and vegetables. Continue walking with [place]  as often as possible. Stay active.      Depression Screen PHQ 2/9 Scores 03/26/2016 09/07/2015 06/02/2015 07/12/2014 07/01/2013 03/02/2013 03/02/2013  PHQ - 2 Score 0 0 0 0 0 0 0    Fall Risk Fall Risk  03/26/2016 09/07/2015 06/02/2015 07/12/2014 07/01/2013  Falls in the past year? No No No No No    Cognitive Function: MMSE - Mini Mental State Exam 03/26/2016  Orientation to time 5  Orientation to Place 5  Registration 3  Attention/ Calculation 5  Recall 3  Language- name 2 objects 2  Language- repeat 1  Language- follow 3 step command 3  Language- read & follow direction 1  Write a sentence 1  Copy design 1  Total score 30    Screening Tests Health Maintenance  Topic Date Due  . TETANUS/TDAP  10/01/1953  . MAMMOGRAM  03/30/2016  . INFLUENZA VACCINE  07/31/2016  . DEXA SCAN  Completed  . ZOSTAVAX  Completed  . PNA vac Low Risk Adult  Completed      Plan:   End of life planning; Advance aging; Advanced directives discussed. Copy of current HCPOA/Living Will requested.    During the course of the visit, Maiana was educated and counseled about the following appropriate screening and preventive services:   Vaccines to include Pneumoccal, Influenza, Hepatitis B, Td, Zostavax, HCV  Electrocardiogram  Cardiovascular disease screening  Colorectal cancer screening  Bone density screening  Diabetes screening  Glaucoma screening  Mammography/PAP  Nutrition counseling  Smoking cessation counseling  Patient Instructions (the written plan) were given to the patient.    Varney Biles, LPN   3/54/3014    Reviewed above information.  Agree with plan.    Dr Nicki Reaper

## 2016-03-26 NOTE — Patient Instructions (Addendum)
[name] , Thank you for taking time to come for your Medicare Wellness Visit. I appreciate your ongoing commitment to your health goals. Please review the following plan we discussed and let me know if I can assist you in the future.   Follow up with Dr. Nicki Reaper as needed.  Mammogram as directed.   This is a list of the screening recommended for you and due dates:  Health Maintenance  Topic Date Due  . Tetanus Vaccine  10/01/1953  . Mammogram  03/30/2016  . Flu Shot  07/31/2016  . DEXA scan (bone density measurement)  Completed  . Shingles Vaccine  Completed  . Pneumonia vaccines  Completed    Health Maintenance, Female Adopting a healthy lifestyle and getting preventive care can go a long way to promote health and wellness. Talk with your health care provider about what schedule of regular examinations is right for you. This is a good chance for you to check in with your provider about disease prevention and staying healthy. In between checkups, there are plenty of things you can do on your own. Experts have done a lot of research about which lifestyle changes and preventive measures are most likely to keep you healthy. Ask your health care provider for more information. WEIGHT AND DIET  Eat a healthy diet  Be sure to include plenty of vegetables, fruits, low-fat dairy products, and lean protein.  Do not eat a lot of foods high in solid fats, added sugars, or salt.  Get regular exercise. This is one of the most important things you can do for your health.  Most adults should exercise for at least 150 minutes each week. The exercise should increase your heart rate and make you sweat (moderate-intensity exercise).  Most adults should also do strengthening exercises at least twice a week. This is in addition to the moderate-intensity exercise.  Maintain a healthy weight  Body mass index (BMI) is a measurement that can be used to identify possible weight problems. It estimates  body fat based on height and weight. Your health care provider can help determine your BMI and help you achieve or maintain a healthy weight.  For females 80 years of age and older:   A BMI below 18.5 is considered underweight.  A BMI of 18.5 to 24.9 is normal.  A BMI of 25 to 29.9 is considered overweight.  A BMI of 30 and above is considered obese.  Watch levels of cholesterol and blood lipids  You should start having your blood tested for lipids and cholesterol at 80 years of age, then have this test every 5 years.  You may need to have your cholesterol levels checked more often if:  Your lipid or cholesterol levels are high.  You are older than 80 years of age.  You are at high risk for heart disease.  CANCER SCREENING   Lung Cancer  Lung cancer screening is recommended for adults 10-53 years old who are at high risk for lung cancer because of a history of smoking.  A yearly low-dose CT scan of the lungs is recommended for people who:  Currently smoke.  Have quit within the past 15 years.  Have at least a 30-pack-year history of smoking. A pack year is smoking an average of one pack of cigarettes a day for 1 year.  Yearly screening should continue until it has been 15 years since you quit.  Yearly screening should stop if you develop a health problem that would prevent  you from having lung cancer treatment.  Breast Cancer  Practice breast self-awareness. This means understanding how your breasts normally appear and feel.  It also means doing regular breast self-exams. Let your health care provider know about any changes, no matter how small.  If you are in your 20s or 30s, you should have a clinical breast exam (CBE) by a health care provider every 1-3 years as part of a regular health exam.  If you are 65 or older, have a CBE every year. Also consider having a breast X-ray (mammogram) every year.  If you have a family history of breast cancer, talk to your  health care provider about genetic screening.  If you are at high risk for breast cancer, talk to your health care provider about having an MRI and a mammogram every year.  Breast cancer gene (BRCA) assessment is recommended for women who have family members with BRCA-related cancers. BRCA-related cancers include:  Breast.  Ovarian.  Tubal.  Peritoneal cancers.  Results of the assessment will determine the need for genetic counseling and BRCA1 and BRCA2 testing. Cervical Cancer Your health care provider may recommend that you be screened regularly for cancer of the pelvic organs (ovaries, uterus, and vagina). This screening involves a pelvic examination, including checking for microscopic changes to the surface of your cervix (Pap test). You may be encouraged to have this screening done every 3 years, beginning at age 36.  For women ages 58-65, health care providers may recommend pelvic exams and Pap testing every 3 years, or they may recommend the Pap and pelvic exam, combined with testing for human papilloma virus (HPV), every 5 years. Some types of HPV increase your risk of cervical cancer. Testing for HPV may also be done on women of any age with unclear Pap test results.  Other health care providers may not recommend any screening for nonpregnant women who are considered low risk for pelvic cancer and who do not have symptoms. Ask your health care provider if a screening pelvic exam is right for you.  If you have had past treatment for cervical cancer or a condition that could lead to cancer, you need Pap tests and screening for cancer for at least 20 years after your treatment. If Pap tests have been discontinued, your risk factors (such as having a new sexual partner) need to be reassessed to determine if screening should resume. Some women have medical problems that increase the chance of getting cervical cancer. In these cases, your health care provider may recommend more frequent  screening and Pap tests. Colorectal Cancer  This type of cancer can be detected and often prevented.  Routine colorectal cancer screening usually begins at 80 years of age and continues through 80 years of age.  Your health care provider may recommend screening at an earlier age if you have risk factors for colon cancer.  Your health care provider may also recommend using home test kits to check for hidden blood in the stool.  A small camera at the end of a tube can be used to examine your colon directly (sigmoidoscopy or colonoscopy). This is done to check for the earliest forms of colorectal cancer.  Routine screening usually begins at age 2.  Direct examination of the colon should be repeated every 5-10 years through 80 years of age. However, you may need to be screened more often if early forms of precancerous polyps or small growths are found. Skin Cancer  Check your skin from head to  toe regularly.  Tell your health care provider about any new moles or changes in moles, especially if there is a change in a mole's shape or color.  Also tell your health care provider if you have a mole that is larger than the size of a pencil eraser.  Always use sunscreen. Apply sunscreen liberally and repeatedly throughout the day.  Protect yourself by wearing long sleeves, pants, a wide-brimmed hat, and sunglasses whenever you are outside. HEART DISEASE, DIABETES, AND HIGH BLOOD PRESSURE   High blood pressure causes heart disease and increases the risk of stroke. High blood pressure is more likely to develop in:  People who have blood pressure in the high end of the normal range (130-139/85-89 mm Hg).  People who are overweight or obese.  People who are African American.  If you are 81-27 years of age, have your blood pressure checked every 3-5 years. If you are 38 years of age or older, have your blood pressure checked every year. You should have your blood pressure measured twice--once  when you are at a hospital or clinic, and once when you are not at a hospital or clinic. Record the average of the two measurements. To check your blood pressure when you are not at a hospital or clinic, you can use:  An automated blood pressure machine at a pharmacy.  A home blood pressure monitor.  If you are between 66 years and 23 years old, ask your health care provider if you should take aspirin to prevent strokes.  Have regular diabetes screenings. This involves taking a blood sample to check your fasting blood sugar level.  If you are at a normal weight and have a low risk for diabetes, have this test once every three years after 80 years of age.  If you are overweight and have a high risk for diabetes, consider being tested at a younger age or more often. PREVENTING INFECTION  Hepatitis B  If you have a higher risk for hepatitis B, you should be screened for this virus. You are considered at high risk for hepatitis B if:  You were born in a country where hepatitis B is common. Ask your health care provider which countries are considered high risk.  Your parents were born in a high-risk country, and you have not been immunized against hepatitis B (hepatitis B vaccine).  You have HIV or AIDS.  You use needles to inject street drugs.  You live with someone who has hepatitis B.  You have had sex with someone who has hepatitis B.  You get hemodialysis treatment.  You take certain medicines for conditions, including cancer, organ transplantation, and autoimmune conditions. Hepatitis C  Blood testing is recommended for:  Everyone born from [birth year] through 1963.  Anyone with known risk factors for hepatitis C. Sexually transmitted infections (STIs)  You should be screened for sexually transmitted infections (STIs) including gonorrhea and chlamydia if:  You are sexually active and are younger than 80 years of age.  You are older than 80 years of age and your health care  provider tells you that you are at risk for this type of infection.  Your sexual activity has changed since you were last screened and you are at an increased risk for chlamydia or gonorrhea. Ask your health care provider if you are at risk.  If you do not have HIV, but are at risk, it may be recommended that you take a prescription medicine daily to prevent HIV infection. This  is called pre-exposure prophylaxis (PrEP). You are considered at risk if:  You are sexually active and do not regularly use condoms or know the HIV status of your partner(s).  You take drugs by injection.  You are sexually active with a partner who has HIV. Talk with your health care provider about whether you are at high risk of being infected with HIV. If you choose to begin PrEP, you should first be tested for HIV. You should then be tested every 3 months for as long as you are taking PrEP.  PREGNANCY   If you are premenopausal and you may become pregnant, ask your health care provider about preconception counseling.  If you may become pregnant, take 400 to 800 micrograms (mcg) of folic acid every day.  If you want to prevent pregnancy, talk to your health care provider about birth control (contraception). OSTEOPOROSIS AND MENOPAUSE   Osteoporosis is a disease in which the bones lose minerals and strength with aging. This can result in serious bone fractures. Your risk for osteoporosis can be identified using a bone density scan.  If you are 18 years of age or older, or if you are at risk for osteoporosis and fractures, ask your health care provider if you should be screened.  Ask your health care provider whether you should take a calcium or vitamin D supplement to lower your risk for osteoporosis.  Menopause may have certain physical symptoms and risks.  Hormone replacement therapy may reduce some of these symptoms and risks. Talk to your health care provider about whether hormone replacement therapy is  right for you.  HOME CARE INSTRUCTIONS   Schedule regular health, dental, and eye exams.  Stay current with your immunizations.   Do not use any tobacco products including cigarettes, chewing tobacco, or electronic cigarettes.  If you are pregnant, do not drink alcohol.  If you are breastfeeding, limit how much and how often you drink alcohol.  Limit alcohol intake to no more than 1 drink per day for nonpregnant women. One drink equals 12 ounces of beer, 5 ounces of wine, or 1 ounces of hard liquor.  Do not use street drugs.  Do not share needles.  Ask your health care provider for help if you need support or information about quitting drugs.  Tell your health care provider if you often feel depressed.  Tell your health care provider if you have ever been abused or do not feel safe at home.   This information is not intended to replace advice given to you by your health care provider. Make sure you discuss any questions you have with your health care provider.   Document Released: 07/02/2011 Document Revised: 01/07/2015 Document Reviewed: 11/18/2013 Elsevier Interactive Patient Education [employer].

## 2016-03-27 ENCOUNTER — Other Ambulatory Visit: Payer: Self-pay | Admitting: Internal Medicine

## 2016-04-03 ENCOUNTER — Ambulatory Visit
Admission: RE | Admit: 2016-04-03 | Discharge: 2016-04-03 | Disposition: A | Discharge status: Home / Self Care | Payer: Medicare Other | Source: Ambulatory Visit | Attending: Internal Medicine | Admitting: Internal Medicine

## 2016-04-03 ENCOUNTER — Other Ambulatory Visit: Payer: Self-pay | Admitting: Internal Medicine

## 2016-04-03 DIAGNOSIS — Z1231 Encounter for screening mammogram for malignant neoplasm of breast: Secondary | ICD-10-CM

## 2016-04-03 DIAGNOSIS — Z1239 Encounter for other screening for malignant neoplasm of breast: Secondary | ICD-10-CM

## 2016-04-11 ENCOUNTER — Ambulatory Visit (INDEPENDENT_AMBULATORY_CARE_PROVIDER_SITE_OTHER): Payer: Medicare Other

## 2016-04-11 DIAGNOSIS — E538 Deficiency of other specified B group vitamins: Secondary | ICD-10-CM | POA: Diagnosis not present

## 2016-04-11 MED ORDER — CYANOCOBALAMIN 1000 MCG/ML IJ SOLN
1000.0000 ug | Freq: Once | INTRAMUSCULAR | Status: AC
  Administered 2016-04-11: 1000 ug via INTRAMUSCULAR

## 2016-04-11 NOTE — Progress Notes (Signed)
Patient came in for B12 injection.  Received in right deltoid.  Patient tolerated well.

## 2016-04-16 ENCOUNTER — Other Ambulatory Visit: Payer: Self-pay | Admitting: Internal Medicine

## 2016-04-16 NOTE — Telephone Encounter (Signed)
Okay to refill? Last OV: 03/16/16/Next OV: 05/10/16

## 2016-04-16 NOTE — Telephone Encounter (Signed)
ok'd ambien #30 with 1 refill.

## 2016-04-16 NOTE — Telephone Encounter (Signed)
Rx faxed

## 2016-05-10 ENCOUNTER — Ambulatory Visit (INDEPENDENT_AMBULATORY_CARE_PROVIDER_SITE_OTHER): Payer: Medicare Other | Admitting: Internal Medicine

## 2016-05-10 ENCOUNTER — Encounter: Payer: Self-pay | Admitting: Internal Medicine

## 2016-05-10 VITALS — BP 110/70 | HR 56 | Temp 97.8°F | Resp 18 | Ht 60.0 in | Wt 159.1 lb

## 2016-05-10 DIAGNOSIS — I10 Essential (primary) hypertension: Secondary | ICD-10-CM | POA: Diagnosis not present

## 2016-05-10 DIAGNOSIS — K754 Autoimmune hepatitis: Secondary | ICD-10-CM | POA: Insufficient documentation

## 2016-05-10 DIAGNOSIS — K2270 Barrett's esophagus without dysplasia: Secondary | ICD-10-CM | POA: Diagnosis not present

## 2016-05-10 DIAGNOSIS — K209 Esophagitis, unspecified without bleeding: Secondary | ICD-10-CM

## 2016-05-10 DIAGNOSIS — E669 Obesity, unspecified: Secondary | ICD-10-CM

## 2016-05-10 DIAGNOSIS — R739 Hyperglycemia, unspecified: Secondary | ICD-10-CM | POA: Diagnosis not present

## 2016-05-10 DIAGNOSIS — D472 Monoclonal gammopathy: Secondary | ICD-10-CM

## 2016-05-10 DIAGNOSIS — E538 Deficiency of other specified B group vitamins: Secondary | ICD-10-CM

## 2016-05-10 DIAGNOSIS — E039 Hypothyroidism, unspecified: Secondary | ICD-10-CM

## 2016-05-10 HISTORY — DX: Autoimmune hepatitis: K75.4

## 2016-05-10 LAB — BASIC METABOLIC PANEL
BUN: 32 mg/dL — AB (ref 6–23)
CHLORIDE: 105 meq/L (ref 96–112)
CO2: 24 mEq/L (ref 19–32)
Calcium: 9.4 mg/dL (ref 8.4–10.5)
Creatinine, Ser: 0.97 mg/dL (ref 0.40–1.20)
GFR: 58.79 mL/min — AB (ref >60.00)
GLUCOSE: 92 mg/dL (ref 70–99)
POTASSIUM: 4.2 meq/L (ref 3.5–5.1)
SODIUM: 138 meq/L (ref 135–145)

## 2016-05-10 LAB — HEPATIC FUNCTION PANEL
ALK PHOS: 46 U/L (ref 39–117)
ALT: 17 U/L (ref 0–35)
AST: 24 U/L (ref 0–37)
Albumin: 3.9 g/dL (ref 3.5–5.2)
BILIRUBIN DIRECT: 0.2 mg/dL (ref 0.0–0.3)
Total Bilirubin: 1.3 mg/dL — ABNORMAL HIGH (ref 0.2–1.2)
Total Protein: 7.9 g/dL (ref 6.0–8.3)

## 2016-05-10 LAB — LIPID PANEL
CHOL/HDL RATIO: 4
Cholesterol: 156 mg/dL (ref 0–200)
HDL: 39.4 mg/dL (ref >39.00)
LDL CALC: 103 mg/dL — AB (ref 0–99)
NONHDL: 116.39
Triglycerides: 66 mg/dL (ref 0.0–149.0)
VLDL: 13.2 mg/dL (ref 0.0–40.0)

## 2016-05-10 LAB — HEMOGLOBIN A1C: Hgb A1c MFr Bld: 6 % (ref 4.6–6.5)

## 2016-05-10 NOTE — Progress Notes (Signed)
Patient ID: Lori [name], female   DOB: Nov 20, 1934, 82 y.o.   MRN: [id number]   Subjective:    Patient ID: Lori [name], female    DOB: 1934/02/07, 82 y.o.   MRN: [id number]  HPI  Patient here for a scheduled follow up.  Doing well.  Feels good.  Tries to stay active.  No cardiac symptoms with increased activity or exertion.  No sob.  No acid reflux.  Just had EGD in 01/2016.  Report reviewed.  Recommended f/u EGD in one year.  Also seeing GI for her liver.  Had fibroscan recently that revealed some findings - high risk for fibrosis.  She is doing well.  No abdominal pain or cramping. Bowels better.  Some fatigue, but nothing out of the ordinary.  Has f/u next month with GI.    Past Medical History  Diagnosis Date  . Hypertension   . Hypothyroidism   . GERD (gastroesophageal reflux disease)   . Hypercholesterolemia   . Ulcer disease   . Diverticulosis   . Anemia     iron deficient  . B12 deficiency   . Fibrocystic breast disease   . Chronic kidney disease   . Autoimmune hepatitis ([place]) 05/10/2016   Past Surgical History  Procedure Laterality Date  . Lap hiatal hernia repair  07/08/08  . Left oophorectomy  1993    benign ovarian cyst  . Tubal ligation  1981  . Hernia repair    . Esophagogastroduodenoscopy (egd) with propofol N/A 01/30/2016    Procedure: ESOPHAGOGASTRODUODENOSCOPY (EGD) WITH PROPOFOL;  Surgeon: Lollie Sails, MD;  Location: [facility] ENDOSCOPY;  Service: Endoscopy;  Laterality: N/A;   Family History  Problem Relation Age of Onset  . Stroke Mother   . Breast cancer Neg Hx   . Colon cancer Neg Hx    Social History   Social History  . Marital Status: Widowed    Spouse Name: N/A  . Number of Children: 0  . Years of Education: N/A   Social History Main Topics  . Smoking status: Never Smoker   . Smokeless tobacco: Never Used  . Alcohol Use: No  . Drug Use: No  . Sexual Activity: No   Other Topics Concern  . None   Social History Narrative      Outpatient Encounter Prescriptions as of 05/10/2016  Medication Sig  . aspirin 81 MG tablet Take 81 mg by mouth daily.  Marland Kitchen atenolol (TENORMIN) 25 MG tablet Take 1 tablet (25 mg total) by mouth daily.  . benazepril (LOTENSIN) 20 MG tablet TAKE ONE (1) TABLET BY MOUTH EVERY DAY  . calcitonin, salmon, (MIACALCIN/FORTICAL) 200 UNIT/ACT nasal spray Place 1 spray into alternate nostrils daily. 1 spray once daily. Alternate nostrils.  . Calcium Citrate (CITRACAL PO) Take 1,200 mg by mouth daily.  . Cholecalciferol (D3-1000 PO) Take by mouth daily.  . Cyanocobalamin (B-12) 1000 MCG/ML KIT Inject 1,000 Units as directed.  Marland Kitchen GLUCOSAMINE-CHONDROITIN PO Take 2 tablets by mouth daily.  Marland Kitchen levothyroxine (SYNTHROID, LEVOTHROID) 50 MCG tablet Take 1 tablet (50 mcg total) by mouth daily before breakfast.  . magnesium oxide (MAG-OX) 400 (241.3 Mg) MG tablet TAKE ONE (1) TABLET EACH DAY  . Mesalamine (DELZICOL) 400 MG CPDR Take 400 mg by mouth 3 (three) times daily.  . pantoprazole (PROTONIX) 40 MG tablet Take 40 mg by mouth daily.   . Probiotic Product (ALIGN) 4 MG CAPS Take 1 capsule by mouth daily.  Marland Kitchen triamcinolone (NASACORT) 55 MCG/ACT AERO nasal inhaler  Place into the nose.  . Wheat Dextrin (BENEFIBER DRINK MIX) PACK Take by mouth.  . zolpidem (AMBIEN CR) 6.25 MG CR tablet TAKE ONE TABLET BY MOUTH AT BEDTIME AS NEEDED FOR SLEEP   No facility-administered encounter medications on file as of 05/10/2016.    Review of Systems  Constitutional: Positive for fatigue. Negative for appetite change and unexpected weight change.  HENT: Negative for congestion and sinus pressure.   Respiratory: Negative for cough, chest tightness and shortness of breath.   Cardiovascular: Negative for chest pain, palpitations and leg swelling.  Gastrointestinal: Negative for nausea, vomiting, abdominal pain and diarrhea (bowels better. ).  Genitourinary: Negative for dysuria and difficulty urinating.  Musculoskeletal: Negative  for back pain and joint swelling.  Skin: Negative for color change and rash.  Neurological: Negative for dizziness, light-headedness and headaches.  Psychiatric/Behavioral: Negative for dysphoric mood and agitation.       Objective:    Physical Exam  Constitutional: She appears well-developed and well-nourished. No distress.  HENT:  Nose: Nose normal.  Mouth/Throat: Oropharynx is clear and moist.  Neck: Neck supple. No thyromegaly present.  Cardiovascular: Normal rate and regular rhythm.   Pulmonary/Chest: Breath sounds normal. No respiratory distress. She has no wheezes.  Abdominal: Soft. Bowel sounds are normal. There is no tenderness.  Musculoskeletal: She exhibits no edema or tenderness.  Lymphadenopathy:    She has no cervical adenopathy.  Skin: No rash noted. No erythema.  Psychiatric: She has a normal mood and affect. Her behavior is normal.    BP 110/70 mmHg  Pulse 56  Temp(Src) 97.8 F (36.6 C) (Oral)  Resp 18  Ht 5' (1.524 m)  Wt 159 lb 2 oz (72.179 kg)  BMI 31.08 kg/m2  SpO2 95% Wt Readings from Last 3 Encounters:  05/10/16 159 lb 2 oz (72.179 kg)  03/26/16 159 lb 6.4 oz (72.303 kg)  03/16/16 158 lb 12.8 oz (72.031 kg)     Lab Results  Component Value Date   WBC 3.8 11/23/2015   HGB 12.5 11/23/2015   HCT 37.5 11/23/2015   PLT 180 11/23/2015   GLUCOSE 102* 01/11/2016   CHOL 151 09/07/2015   TRIG 91.0 09/07/2015   HDL 40.10 09/07/2015   LDLCALC 92 09/07/2015   ALT 17 01/11/2016   AST 26 01/11/2016   NA 137 01/11/2016   K 4.7 01/11/2016   CL 105 01/11/2016   CREATININE 0.97 01/11/2016   BUN 35* 01/11/2016   CO2 26 01/11/2016   TSH 1.38 01/11/2016   INR 1.03 05/13/2015   HGBA1C 5.9 01/11/2016    Mm Screening Breast Tomo Bilateral  04/03/2016  CLINICAL DATA:  Screening. EXAM: 2D DIGITAL SCREENING BILATERAL MAMMOGRAM WITH CAD AND ADJUNCT TOMO COMPARISON:  Previous exam(s). ACR Breast Density Category c: The breast tissue is heterogeneously dense,  which may obscure small masses. FINDINGS: There are no findings suspicious for malignancy. Images were processed with CAD. IMPRESSION: No mammographic evidence of malignancy. A result letter of this screening mammogram will be mailed directly to the patient. RECOMMENDATION: Screening mammogram in one year. (Code:SM-B-01Y) BI-RADS CATEGORY  1: Negative. Electronically Signed   By: Marin Olp M.D.   On: 04/03/2016 12:03       Assessment & Plan:   Problem List Items Addressed This Visit    Autoimmune hepatitis ([place])    Followed by GI.  See Dr Durenda Age note.  Recheck liver panel today.       Relevant Orders   Hepatic function panel  B12 deficiency    Continue B12 injections.       Barrett's esophagus with esophagitis - Primary    EGD 01/30/16 - Barretts (no dysplasia or malignancy noted on path).  Recommended f/u EGD in one year.  Followed by GI.  Symptoms controlled.        Hypertension    Blood pressure under good control.  Continue same medication regimen.  Follow pressures.  Follow metabolic panel.        Relevant Orders   Lipid panel   Basic metabolic panel   Hypothyroidism    On thyroid replacement.  Follow tsh.  Last tsh wnl.       MGUS (monoclonal gammopathy of unknown significance)    Followed by hematology.  Sees Dr Grayland Ormond.       Obesity (BMI 30-39.9)    Diet and exercise.        Other Visit Diagnoses    Hyperglycemia        Relevant Orders    Hemoglobin A1c        Einar Pheasant, MD

## 2016-05-10 NOTE — Assessment & Plan Note (Signed)
Continue B12 injections.   

## 2016-05-10 NOTE — Addendum Note (Signed)
Addended by: Karlene Einstein D on: 05/10/2016 09:42 AM   Modules accepted: Orders

## 2016-05-10 NOTE — Assessment & Plan Note (Signed)
On thyroid replacement.  Follow tsh.  Last tsh wnl.

## 2016-05-10 NOTE — Assessment & Plan Note (Signed)
EGD 01/30/16 - Barretts (no dysplasia or malignancy noted on path).  Recommended f/u EGD in one year.  Followed by GI.  Symptoms controlled.

## 2016-05-10 NOTE — Assessment & Plan Note (Signed)
Diet and exercise.   

## 2016-05-10 NOTE — Progress Notes (Signed)
Pre-visit discussion using our clinic review tool. No additional management support is needed unless otherwise documented below in the visit note.  

## 2016-05-10 NOTE — Assessment & Plan Note (Signed)
Followed by GI.  See Dr Durenda Age note.  Recheck liver panel today.

## 2016-05-10 NOTE — Assessment & Plan Note (Signed)
Followed by hematology.  Sees Dr Grayland Ormond.

## 2016-05-10 NOTE — Assessment & Plan Note (Signed)
Blood pressure under good control.  Continue same medication regimen.  Follow pressures.  Follow metabolic panel.   

## 2016-05-14 ENCOUNTER — Encounter: Payer: Self-pay | Admitting: *Deleted

## 2016-05-14 ENCOUNTER — Other Ambulatory Visit: Payer: Self-pay | Admitting: Internal Medicine

## 2016-05-14 NOTE — Progress Notes (Signed)
Order placed for f/u liver panel.  

## 2016-05-15 ENCOUNTER — Ambulatory Visit (INDEPENDENT_AMBULATORY_CARE_PROVIDER_SITE_OTHER): Payer: Medicare Other | Admitting: *Deleted

## 2016-05-15 DIAGNOSIS — E538 Deficiency of other specified B group vitamins: Secondary | ICD-10-CM | POA: Diagnosis not present

## 2016-05-15 MED ORDER — CYANOCOBALAMIN 1000 MCG/ML IJ SOLN
1000.0000 ug | Freq: Once | INTRAMUSCULAR | Status: AC
  Administered 2016-05-15: 1000 ug via INTRAMUSCULAR

## 2016-05-15 NOTE — Progress Notes (Signed)
Patient presented for B 12 injection given in left deltoid patient tolerated well.

## 2016-05-21 ENCOUNTER — Other Ambulatory Visit: Payer: Self-pay | Admitting: Internal Medicine

## 2016-06-05 ENCOUNTER — Other Ambulatory Visit: Payer: Medicare Other

## 2016-06-05 ENCOUNTER — Other Ambulatory Visit (INDEPENDENT_AMBULATORY_CARE_PROVIDER_SITE_OTHER): Payer: Medicare Other

## 2016-06-05 LAB — HEPATIC FUNCTION PANEL
ALBUMIN: 3.9 g/dL (ref 3.5–5.2)
ALK PHOS: 46 U/L (ref 39–117)
ALT: 14 U/L (ref 0–35)
AST: 21 U/L (ref 0–37)
BILIRUBIN TOTAL: 1.1 mg/dL (ref 0.2–1.2)
Bilirubin, Direct: 0.2 mg/dL (ref 0.0–0.3)
Total Protein: 8 g/dL (ref 6.0–8.3)

## 2016-06-07 ENCOUNTER — Encounter: Payer: Self-pay | Admitting: Internal Medicine

## 2016-06-15 ENCOUNTER — Other Ambulatory Visit: Payer: Self-pay | Admitting: Internal Medicine

## 2016-06-15 NOTE — Telephone Encounter (Signed)
Okay to refill? Last seen on 05/10/16 & next appt on: 09/10/16

## 2016-06-16 NOTE — Telephone Encounter (Signed)
ok'd ambien #30 with one refill.

## 2016-06-18 NOTE — Telephone Encounter (Signed)
Rx faxed to [facility].

## 2016-06-19 ENCOUNTER — Ambulatory Visit (INDEPENDENT_AMBULATORY_CARE_PROVIDER_SITE_OTHER): Payer: Medicare Other | Admitting: *Deleted

## 2016-06-19 DIAGNOSIS — E538 Deficiency of other specified B group vitamins: Secondary | ICD-10-CM | POA: Diagnosis not present

## 2016-06-19 MED ORDER — CYANOCOBALAMIN 1000 MCG/ML IJ SOLN
1000.0000 ug | Freq: Once | INTRAMUSCULAR | Status: AC
  Administered 2016-06-19: 1000 ug via INTRAMUSCULAR

## 2016-06-19 NOTE — Progress Notes (Signed)
Patient presented for B 12 injection given in right deltoid patient tolerated well.

## 2016-06-21 DIAGNOSIS — K754 Autoimmune hepatitis: Secondary | ICD-10-CM | POA: Diagnosis not present

## 2016-07-19 ENCOUNTER — Ambulatory Visit (INDEPENDENT_AMBULATORY_CARE_PROVIDER_SITE_OTHER): Payer: Medicare Other

## 2016-07-19 DIAGNOSIS — E538 Deficiency of other specified B group vitamins: Secondary | ICD-10-CM

## 2016-07-19 MED ORDER — CYANOCOBALAMIN 1000 MCG/ML IJ SOLN
1000.0000 ug | Freq: Once | INTRAMUSCULAR | Status: AC
  Administered 2016-07-19: 1000 ug via INTRAMUSCULAR

## 2016-07-19 NOTE — Progress Notes (Signed)
Patient came in for b12 injection, received in left deltoid.  Patient tolerated well. thanks

## 2016-07-30 ENCOUNTER — Telehealth: Payer: Self-pay

## 2016-07-30 NOTE — Telephone Encounter (Signed)
Spoke with Patient, discussed prior treatment to Ambien.  She can't recall what meds she tried before, states that was back when Dr. Nicki Reaper was with [facility].  She was on Ambien prior to 2009.  She did remember having surgery in 2009 and the hospital gave an increased dose post surgery and then it was continued after that.  The lower dose of ambien was only allowing her to sleep for 1-2 hours and then she was wide awake for the remainder of the night.  No other meds in our chart have been documented as tried. Thanks, form given back to your folder. thanks

## 2016-08-02 ENCOUNTER — Encounter: Payer: Self-pay | Admitting: Internal Medicine

## 2016-08-02 NOTE — Telephone Encounter (Signed)
Printed and completed [employer] appeal form, put on PCP desk to sign.

## 2016-08-02 NOTE — Telephone Encounter (Signed)
Signed and placed back on your desk.   °

## 2016-08-02 NOTE — Telephone Encounter (Signed)
Letter written for PA for ambien.

## 2016-08-16 ENCOUNTER — Other Ambulatory Visit: Payer: Self-pay | Admitting: *Deleted

## 2016-08-16 MED ORDER — ZOLPIDEM TARTRATE ER 6.25 MG PO TBCR: 6.2500 mg | EXTENDED_RELEASE_TABLET | Freq: Every evening | ORAL | 1 refills | Status: DC | PRN

## 2016-08-16 NOTE — Telephone Encounter (Signed)
Okay to refill? Last approved in July with one refill.

## 2016-08-16 NOTE — Telephone Encounter (Signed)
ok'd rx for ambien #30 with one refill.   

## 2016-08-17 ENCOUNTER — Telehealth: Payer: Self-pay

## 2016-08-17 NOTE — Telephone Encounter (Signed)
RX for Ambien called in -aa

## 2016-08-21 ENCOUNTER — Other Ambulatory Visit: Payer: Self-pay | Admitting: Internal Medicine

## 2016-08-21 ENCOUNTER — Ambulatory Visit (INDEPENDENT_AMBULATORY_CARE_PROVIDER_SITE_OTHER): Payer: Medicare Other | Admitting: *Deleted

## 2016-08-21 DIAGNOSIS — E538 Deficiency of other specified B group vitamins: Secondary | ICD-10-CM | POA: Diagnosis not present

## 2016-08-21 DIAGNOSIS — Z23 Encounter for immunization: Secondary | ICD-10-CM

## 2016-08-21 MED ORDER — CYANOCOBALAMIN 1000 MCG/ML IJ SOLN
1000.0000 ug | Freq: Once | INTRAMUSCULAR | Status: AC
  Administered 2016-08-21: 1000 ug via INTRAMUSCULAR

## 2016-08-21 NOTE — Progress Notes (Signed)
Care was provided under my supervision. I agree with the management as indicated in the note.  Jalene Lacko DO  

## 2016-08-21 NOTE — Progress Notes (Signed)
Patient presented for B 12  injection to right deltoid patient tolerated well. 

## 2016-08-31 DIAGNOSIS — M898X5 Other specified disorders of bone, thigh: Secondary | ICD-10-CM | POA: Diagnosis not present

## 2016-08-31 DIAGNOSIS — M1711 Unilateral primary osteoarthritis, right knee: Secondary | ICD-10-CM | POA: Diagnosis not present

## 2016-09-05 DIAGNOSIS — K754 Autoimmune hepatitis: Secondary | ICD-10-CM | POA: Diagnosis not present

## 2016-09-10 ENCOUNTER — Ambulatory Visit (INDEPENDENT_AMBULATORY_CARE_PROVIDER_SITE_OTHER): Payer: Medicare Other | Admitting: Internal Medicine

## 2016-09-10 ENCOUNTER — Encounter: Payer: Self-pay | Admitting: Internal Medicine

## 2016-09-10 VITALS — BP 110/70 | HR 55 | Temp 97.8°F | Ht 60.0 in | Wt 163.6 lb

## 2016-09-10 DIAGNOSIS — K209 Esophagitis, unspecified without bleeding: Secondary | ICD-10-CM

## 2016-09-10 DIAGNOSIS — K754 Autoimmune hepatitis: Secondary | ICD-10-CM

## 2016-09-10 DIAGNOSIS — Z79899 Other long term (current) drug therapy: Secondary | ICD-10-CM | POA: Diagnosis not present

## 2016-09-10 DIAGNOSIS — Z0000 Encounter for general adult medical examination without abnormal findings: Secondary | ICD-10-CM

## 2016-09-10 DIAGNOSIS — R739 Hyperglycemia, unspecified: Secondary | ICD-10-CM | POA: Diagnosis not present

## 2016-09-10 DIAGNOSIS — M25561 Pain in right knee: Secondary | ICD-10-CM

## 2016-09-10 DIAGNOSIS — D649 Anemia, unspecified: Secondary | ICD-10-CM

## 2016-09-10 DIAGNOSIS — E039 Hypothyroidism, unspecified: Secondary | ICD-10-CM

## 2016-09-10 DIAGNOSIS — N6489 Other specified disorders of breast: Secondary | ICD-10-CM

## 2016-09-10 DIAGNOSIS — E538 Deficiency of other specified B group vitamins: Secondary | ICD-10-CM

## 2016-09-10 DIAGNOSIS — K2270 Barrett's esophagus without dysplasia: Secondary | ICD-10-CM

## 2016-09-10 DIAGNOSIS — I10 Essential (primary) hypertension: Secondary | ICD-10-CM | POA: Diagnosis not present

## 2016-09-10 DIAGNOSIS — E669 Obesity, unspecified: Secondary | ICD-10-CM

## 2016-09-10 DIAGNOSIS — D472 Monoclonal gammopathy: Secondary | ICD-10-CM

## 2016-09-10 DIAGNOSIS — N289 Disorder of kidney and ureter, unspecified: Secondary | ICD-10-CM

## 2016-09-10 LAB — LIPID PANEL
CHOLESTEROL: 151 mg/dL (ref 0–200)
HDL: 48.2 mg/dL (ref >39.00)
LDL Cholesterol: 86 mg/dL (ref 0–99)
NONHDL: 102.44
Total CHOL/HDL Ratio: 3
Triglycerides: 81 mg/dL (ref 0.0–149.0)
VLDL: 16.2 mg/dL (ref 0.0–40.0)

## 2016-09-10 LAB — CBC WITH DIFFERENTIAL/PLATELET
BASOS PCT: 0.6 % (ref 0.0–3.0)
Basophils Absolute: 0 10*3/uL (ref 0.0–0.1)
EOS PCT: 1.2 % (ref 0.0–5.0)
Eosinophils Absolute: 0.1 10*3/uL (ref 0.0–0.7)
HCT: 38.9 % (ref 36.0–46.0)
Hemoglobin: 13.2 g/dL (ref 12.0–15.0)
LYMPHS ABS: 1 10*3/uL (ref 0.7–4.0)
Lymphocytes Relative: 20 % (ref 12.0–46.0)
MCHC: 34 g/dL (ref 30.0–36.0)
MCV: 87.4 fl (ref 78.0–100.0)
MONO ABS: 0.3 10*3/uL (ref 0.1–1.0)
MONOS PCT: 5.6 % (ref 3.0–12.0)
NEUTROS ABS: 3.7 10*3/uL (ref 1.4–7.7)
NEUTROS PCT: 72.6 % (ref 43.0–77.0)
PLATELETS: 172 10*3/uL (ref 150.0–400.0)
RBC: 4.45 Mil/uL (ref 3.87–5.11)
RDW: 14 % (ref 11.5–15.5)
WBC: 5.1 10*3/uL (ref 4.0–10.5)

## 2016-09-10 LAB — BASIC METABOLIC PANEL
BUN: 25 mg/dL — ABNORMAL HIGH (ref 6–23)
CHLORIDE: 105 meq/L (ref 96–112)
CO2: 24 meq/L (ref 19–32)
CREATININE: 0.88 mg/dL (ref 0.40–1.20)
Calcium: 9.1 mg/dL (ref 8.4–10.5)
GFR: 65.72 mL/min (ref >60.00)
Glucose, Bld: 93 mg/dL (ref 70–99)
Potassium: 4.2 mEq/L (ref 3.5–5.1)
SODIUM: 136 meq/L (ref 135–145)

## 2016-09-10 LAB — HEMOGLOBIN A1C: HEMOGLOBIN A1C: 5.8 % (ref 4.6–6.5)

## 2016-09-10 NOTE — Assessment & Plan Note (Signed)
Physical today 09/10/16.  Mammogram 04/03/16 - Birads I.  Followed by GI.

## 2016-09-10 NOTE — Progress Notes (Signed)
Patient ID: Lori [name], female   DOB: 1934/11/12, 82 y.o.   MRN: [id number]   Subjective:    Patient ID: Lori [name], female    DOB: 1934/10/02, 82 y.o.   MRN: [id number]  HPI  Patient with past history of autoimmune hepatitis, GERD, hypercholesterolemia and hypertension  She comes in today to follow up on these issues as well as for a complete physical exam.  Saw ortho 08/31/16 for right knee pain.  Diagnosed with osteoarthritis.  S/p injection.  She is still having some discomfort.  Will plan f/u when needed to discuss synvisc.  No chest pain.  No sob.  No acid reflux.  No abdominal pain or cramping.  Bowels doing better.  Overall feels things are stable.     Past Medical History:  Diagnosis Date  . Anemia    iron deficient  . Autoimmune hepatitis ([place]) 05/10/2016  . B12 deficiency   . Chronic kidney disease   . Diverticulosis   . Fibrocystic breast disease   . GERD (gastroesophageal reflux disease)   . Hypercholesterolemia   . Hypertension   . Hypothyroidism   . Ulcer disease    Past Surgical History:  Procedure Laterality Date  . ESOPHAGOGASTRODUODENOSCOPY (EGD) WITH PROPOFOL N/A 01/30/2016   Procedure: ESOPHAGOGASTRODUODENOSCOPY (EGD) WITH PROPOFOL;  Surgeon: Lollie Sails, MD;  Location: [facility] ENDOSCOPY;  Service: Endoscopy;  Laterality: N/A;  . HERNIA REPAIR    . lap hiatal hernia repair  07/08/08  . LEFT OOPHORECTOMY  1993   benign ovarian cyst  . TUBAL LIGATION  1981   Family History  Problem Relation Age of Onset  . Stroke Mother   . Breast cancer Neg Hx   . Colon cancer Neg Hx    Social History   Social History  . Marital status: Widowed    Spouse name: N/A  . Number of children: 0  . Years of education: N/A   Social History Main Topics  . Smoking status: Never Smoker  . Smokeless tobacco: Never Used  . Alcohol use No  . Drug use: No  . Sexual activity: No   Other Topics Concern  . None   Social History Narrative  . None     Outpatient Encounter Prescriptions as of 09/10/2016  Medication Sig  . aspirin 81 MG tablet Take 81 mg by mouth daily.  Marland Kitchen atenolol (TENORMIN) 25 MG tablet Take 1 tablet (25 mg total) by mouth daily.  . benazepril (LOTENSIN) 20 MG tablet TAKE ONE (1) TABLET BY MOUTH EVERY DAY  . calcitonin, salmon, (MIACALCIN/FORTICAL) 200 UNIT/ACT nasal spray Place 1 spray into alternate nostrils daily. 1 spray once daily. Alternate nostrils.  . Calcium Citrate (CITRACAL PO) Take 1,200 mg by mouth daily.  . Cholecalciferol (D3-1000 PO) Take by mouth daily.  . Cyanocobalamin (B-12) 1000 MCG/ML KIT Inject 1,000 Units as directed.  Marland Kitchen GLUCOSAMINE-CHONDROITIN PO Take 2 tablets by mouth daily.  Marland Kitchen levothyroxine (SYNTHROID, LEVOTHROID) 50 MCG tablet Take 1 tablet (50 mcg total) by mouth daily before breakfast. (Patient taking differently: Take 75 mcg by mouth daily before breakfast. )  . magnesium oxide (MAG-OX) 400 (241.3 Mg) MG tablet TAKE ONE (1) TABLET EACH DAY  . Mesalamine (DELZICOL) 400 MG CPDR Take 400 mg by mouth 3 (three) times daily.  . pantoprazole (PROTONIX) 40 MG tablet Take 40 mg by mouth daily.   . Probiotic Product (ALIGN) 4 MG CAPS Take 1 capsule by mouth daily.  Marland Kitchen SYNTHROID 75 MCG tablet TAKE ONE (1)  TABLET EACH DAY  . triamcinolone (NASACORT) 55 MCG/ACT AERO nasal inhaler Place into the nose.  . Wheat Dextrin (BENEFIBER DRINK MIX) PACK Take by mouth.  . zolpidem (AMBIEN CR) 6.25 MG CR tablet Take 1 tablet (6.25 mg total) by mouth at bedtime as needed. for sleep   No facility-administered encounter medications on file as of 09/10/2016.     Review of Systems  Constitutional: Negative for appetite change and unexpected weight change.  HENT: Negative for congestion and sinus pressure.   Eyes: Negative for pain and visual disturbance.  Respiratory: Negative for cough, chest tightness and shortness of breath.   Cardiovascular: Negative for chest pain, palpitations and leg swelling.   Gastrointestinal: Negative for abdominal pain, diarrhea, nausea and vomiting.  Genitourinary: Negative for difficulty urinating and dysuria.  Musculoskeletal: Negative for back pain.       Right knee pain as outlined.    Skin: Negative for color change and rash.  Neurological: Negative for dizziness, light-headedness and headaches.  Hematological: Negative for adenopathy. Does not bruise/bleed easily.  Psychiatric/Behavioral: Negative for agitation and dysphoric mood.       Objective:    Physical Exam  Constitutional: She is oriented to person, place, and time. She appears well-developed and well-nourished. No distress.  HENT:  Nose: Nose normal.  Mouth/Throat: Oropharynx is clear and moist.  Eyes: Right eye exhibits no discharge. Left eye exhibits no discharge. No scleral icterus.  Neck: Neck supple. No thyromegaly present.  Cardiovascular: Normal rate and regular rhythm.   Pulmonary/Chest: Breath sounds normal. No accessory muscle usage. No tachypnea. No respiratory distress. She has no decreased breath sounds. She has no wheezes. She has no rhonchi. Right breast exhibits no inverted nipple, no mass, no nipple discharge and no tenderness (no axillary adenopathy). Left breast exhibits no inverted nipple, no mass, no nipple discharge and no tenderness (no axilarry adenopathy).  Abdominal: Soft. Bowel sounds are normal. There is no tenderness.  Musculoskeletal: She exhibits no edema or tenderness.  Lymphadenopathy:    She has no cervical adenopathy.  Neurological: She is alert and oriented to person, place, and time.  Skin: Skin is warm. No rash noted. No erythema.  Psychiatric: She has a normal mood and affect. Her behavior is normal.    BP 110/70   Pulse (!) 55   Temp 97.8 F (36.6 C) (Oral)   Ht 5' (1.524 m)   Wt 163 lb 9.6 oz (74.2 kg)   SpO2 95%   BMI 31.95 kg/m  Wt Readings from Last 3 Encounters:  09/10/16 163 lb 9.6 oz (74.2 kg)  05/10/16 159 lb 2 oz (72.2 kg)   03/26/16 159 lb 6.4 oz (72.3 kg)     Lab Results  Component Value Date   WBC 5.1 09/10/2016   HGB 13.2 09/10/2016   HCT 38.9 09/10/2016   PLT 172.0 09/10/2016   GLUCOSE 93 09/10/2016   CHOL 151 09/10/2016   TRIG 81.0 09/10/2016   HDL 48.20 09/10/2016   LDLCALC 86 09/10/2016   ALT 14 06/05/2016   AST 21 06/05/2016   NA 136 09/10/2016   K 4.2 09/10/2016   CL 105 09/10/2016   CREATININE 0.88 09/10/2016   BUN 25 (H) 09/10/2016   CO2 24 09/10/2016   TSH 1.38 01/11/2016   INR 1.03 05/13/2015   HGBA1C 5.8 09/10/2016    Mm Screening Breast Tomo Bilateral  Result Date: 04/03/2016 CLINICAL DATA:  Screening. EXAM: 2D DIGITAL SCREENING BILATERAL MAMMOGRAM WITH CAD AND ADJUNCT TOMO COMPARISON:  Previous exam(s). ACR Breast Density Category c: The breast tissue is heterogeneously dense, which may obscure small masses. FINDINGS: There are no findings suspicious for malignancy. Images were processed with CAD. IMPRESSION: No mammographic evidence of malignancy. A result letter of this screening mammogram will be mailed directly to the patient. RECOMMENDATION: Screening mammogram in one year. (Code:SM-B-01Y) BI-RADS CATEGORY  1: Negative. Electronically Signed   By: Marin Olp M.D.   On: 04/03/2016 12:03       Assessment & Plan:   Problem List Items Addressed This Visit    Anemia    Followed by hematology.  Has a history of iron deficiency and b12 deficiency.  Also MGUS.       Autoimmune hepatitis ([place])    Followed by GI.  Just had labs.  Liver panel wnl.       B12 deficiency    Continue b12 injections.       Barrett's esophagus with esophagitis    EGD 01/30/16 as outlined.  Currently doing well.  Followed by GI.       Fullness of breast   Relevant Orders   MM Digital Diagnostic Unilat L   US BREAST LTD UNI LEFT INC AXILLA   Health care maintenance    Physical today 09/10/16.  Mammogram 04/03/16 - [facility].  Followed by GI.       Hyperbilirubinemia    Followed by GI.  Just  had labs through GI.  Total bilirubin - 1.2 (wnl).        Hyperglycemia    Low carb diet and exercise.  Follow met b and a1c.       Relevant Orders   Hemoglobin A1c (Completed)   Hypertension    Blood pressure under good control.  Continue same medication regimen.  Follow pressures.  Follow metabolic panel.        Relevant Orders   CBC with Differential/Platelet (Completed)   Lipid panel (Completed)   Basic metabolic panel (Completed)   Hypothyroidism    On thyroid replacement.  Follow tsh.       MGUS (monoclonal gammopathy of unknown significance)    Followed by hematology.        Obesity (BMI 30-39.9)    Diet and exercise.  Follow.       Renal insufficiency    Stay hydrated.  Follow renal function.   Lab Results  Component Value Date   CREATININE 0.88 09/10/2016         Other Visit Diagnoses    Routine general medical examination at a health care facility    -  Primary   Right knee pain       Followed by ortho.        Einar Pheasant, MD

## 2016-09-10 NOTE — Progress Notes (Signed)
Pre visit review using our clinic review tool, if applicable. No additional management support is needed unless otherwise documented below in the visit note. 

## 2016-09-11 ENCOUNTER — Encounter: Payer: Self-pay | Admitting: Internal Medicine

## 2016-09-16 ENCOUNTER — Encounter: Payer: Self-pay | Admitting: Internal Medicine

## 2016-09-16 NOTE — Assessment & Plan Note (Signed)
Stay hydrated.  Follow renal function.   Lab Results  Component Value Date   CREATININE 0.88 09/10/2016

## 2016-09-16 NOTE — Assessment & Plan Note (Signed)
Low carb diet and exercise.  Follow met b and a1c.

## 2016-09-16 NOTE — Assessment & Plan Note (Signed)
On thyroid replacement.  Follow tsh.  

## 2016-09-16 NOTE — Assessment & Plan Note (Signed)
EGD 01/30/16 as outlined.  Currently doing well.  Followed by GI.

## 2016-09-16 NOTE — Assessment & Plan Note (Signed)
Blood pressure under good control.  Continue same medication regimen.  Follow pressures.  Follow metabolic panel.   

## 2016-09-16 NOTE — Assessment & Plan Note (Signed)
Continue b12 injections.  

## 2016-09-16 NOTE — Assessment & Plan Note (Addendum)
Followed by GI.  Just had labs through GI.  Total bilirubin - 1.2 (wnl).

## 2016-09-16 NOTE — Assessment & Plan Note (Signed)
Followed by hematology 

## 2016-09-16 NOTE — Assessment & Plan Note (Signed)
Diet and exercise.  Follow.  

## 2016-09-16 NOTE — Assessment & Plan Note (Signed)
Followed by hematology.  Has a history of iron deficiency and b12 deficiency.  Also MGUS.

## 2016-09-16 NOTE — Assessment & Plan Note (Signed)
Followed by GI.  Just had labs.  Liver panel wnl.

## 2016-09-25 ENCOUNTER — Ambulatory Visit
Admission: RE | Admit: 2016-09-25 | Discharge: 2016-09-25 | Disposition: A | Discharge status: Home / Self Care | Payer: Medicare Other | Source: Ambulatory Visit | Attending: Internal Medicine | Admitting: Internal Medicine

## 2016-09-25 ENCOUNTER — Ambulatory Visit (INDEPENDENT_AMBULATORY_CARE_PROVIDER_SITE_OTHER): Payer: Medicare Other

## 2016-09-25 ENCOUNTER — Other Ambulatory Visit: Payer: Self-pay | Admitting: Internal Medicine

## 2016-09-25 DIAGNOSIS — N644 Mastodynia: Secondary | ICD-10-CM | POA: Insufficient documentation

## 2016-09-25 DIAGNOSIS — N6489 Other specified disorders of breast: Secondary | ICD-10-CM | POA: Diagnosis not present

## 2016-09-25 DIAGNOSIS — E538 Deficiency of other specified B group vitamins: Secondary | ICD-10-CM

## 2016-09-25 DIAGNOSIS — R922 Inconclusive mammogram: Secondary | ICD-10-CM | POA: Diagnosis not present

## 2016-09-25 MED ORDER — CYANOCOBALAMIN 1000 MCG/ML IJ SOLN
1000.0000 ug | Freq: Once | INTRAMUSCULAR | Status: AC
  Administered 2016-09-25: 1000 ug via INTRAMUSCULAR

## 2016-09-25 NOTE — Progress Notes (Addendum)
Patient came in for b12 injection, received in right deltoid, tolerated well. Thanks  Reviewed.  Dr Nicki Reaper

## 2016-09-26 ENCOUNTER — Other Ambulatory Visit: Payer: Self-pay | Admitting: Internal Medicine

## 2016-10-01 ENCOUNTER — Telehealth: Payer: Self-pay

## 2016-10-01 NOTE — Telephone Encounter (Signed)
Patient would like refill on chlorzoxazone 500 mg for leg cramps, she sates she has been drinking plenty of water and is taking magnesium

## 2016-10-02 NOTE — Telephone Encounter (Signed)
Patient notified and will schedule appmt 

## 2016-10-02 NOTE — Telephone Encounter (Signed)
Notify pt that given her GI history and other medications,  I would like to hold on refilling this medication.  If persistent problems with cramping, I can see her and evaluate and then determine what is best treatment.  Can schedule appt if pt agrees.

## 2016-10-04 ENCOUNTER — Ambulatory Visit (INDEPENDENT_AMBULATORY_CARE_PROVIDER_SITE_OTHER): Payer: Medicare Other | Admitting: Family

## 2016-10-04 ENCOUNTER — Encounter: Payer: Self-pay | Admitting: Family

## 2016-10-04 VITALS — BP 122/84 | HR 58 | Temp 97.9°F | Wt 166.2 lb

## 2016-10-04 DIAGNOSIS — R252 Cramp and spasm: Secondary | ICD-10-CM

## 2016-10-04 LAB — COMPREHENSIVE METABOLIC PANEL
ALT: 13 U/L (ref 0–35)
AST: 18 U/L (ref 0–37)
Albumin: 3.8 g/dL (ref 3.5–5.2)
Alkaline Phosphatase: 41 U/L (ref 39–117)
BUN: 24 mg/dL — AB (ref 6–23)
CHLORIDE: 106 meq/L (ref 96–112)
CO2: 28 meq/L (ref 19–32)
CREATININE: 0.84 mg/dL (ref 0.40–1.20)
Calcium: 9.4 mg/dL (ref 8.4–10.5)
GFR: 69.34 mL/min (ref >60.00)
GLUCOSE: 103 mg/dL — AB (ref 70–99)
Potassium: 4.6 mEq/L (ref 3.5–5.1)
SODIUM: 141 meq/L (ref 135–145)
Total Bilirubin: 0.6 mg/dL (ref 0.2–1.2)
Total Protein: 7.7 g/dL (ref 6.0–8.3)

## 2016-10-04 LAB — TSH: TSH: 1.32 u[IU]/mL (ref 0.35–4.50)

## 2016-10-04 LAB — MAGNESIUM: Magnesium: 1.8 mg/dL (ref 1.5–2.5)

## 2016-10-04 MED ORDER — CHLORZOXAZONE 500 MG PO TABS: ORAL_TABLET | ORAL | 0 refills | Status: DC

## 2016-10-04 NOTE — Progress Notes (Signed)
Pre visit review using our clinic review tool, if applicable. No additional management support is needed unless otherwise documented below in the visit note. 

## 2016-10-04 NOTE — Progress Notes (Signed)
Subjective:    Patient ID: Lori [name], female    DOB: [birth year], 82 y.o.   MRN: [id number]  CC: Lori [name] is a 82 y.o. female who presents today for an acute visit.    HPI: Patient presents today with a acute complaint of bilateral inner leg cramps with 3 episodes last week. Self limited.  Describes tight pain and then becomes sharp pain. Had been doing a lot of walking prior to episodes. Similar problem in her legs a few years ago.Took chlorzoxazone 500 mg for leg cramps which worked well. Had two old pills which worked well for cramps.  Seems to be triggered 'by a lot of walking.' No changes in her bowel habits, dysuria, falls, LE weakness.  Limiting caffeine and drinking a lot of water.   She has a history of hypothyroidism. She also has a history of cramps in her hands which she started taking magnesium oxide for.    HISTORY:  Past Medical History:  Diagnosis Date  . Anemia    iron deficient  . Autoimmune hepatitis ([place]) 05/10/2016  . B12 deficiency   . Chronic kidney disease   . Diverticulosis   . Fibrocystic breast disease   . GERD (gastroesophageal reflux disease)   . Hypercholesterolemia   . Hypertension   . Hypothyroidism   . Ulcer disease    Past Surgical History:  Procedure Laterality Date  . ESOPHAGOGASTRODUODENOSCOPY (EGD) WITH PROPOFOL N/A 01/30/2016   Procedure: ESOPHAGOGASTRODUODENOSCOPY (EGD) WITH PROPOFOL;  Surgeon: Lollie Sails, MD;  Location: [facility] ENDOSCOPY;  Service: Endoscopy;  Laterality: N/A;  . HERNIA REPAIR    . lap hiatal hernia repair  07/08/08  . LEFT OOPHORECTOMY  1993   benign ovarian cyst  . TUBAL LIGATION  1981   Family History  Problem Relation Age of Onset  . Stroke Mother   . Breast cancer Neg Hx   . Colon cancer Neg Hx     Allergies: Penicillins; Daypro [oxaprozin]; and Lodine [etodolac] Current Outpatient Prescriptions on File Prior to Visit  Medication Sig Dispense Refill  . aspirin 81 MG tablet Take  81 mg by mouth daily.    Marland Kitchen atenolol (TENORMIN) 25 MG tablet TAKE ONE (1) TABLET BY MOUTH EVERY DAY 90 tablet 0  . benazepril (LOTENSIN) 20 MG tablet TAKE ONE (1) TABLET BY MOUTH EVERY DAY 90 tablet 3  . calcitonin, salmon, (MIACALCIN/FORTICAL) 200 UNIT/ACT nasal spray Place 1 spray into alternate nostrils daily. 1 spray once daily. Alternate nostrils. 3.7 mL 11  . Calcium Citrate (CITRACAL PO) Take 1,200 mg by mouth daily.    . Cholecalciferol (D3-1000 PO) Take by mouth daily.    . Cyanocobalamin (B-12) 1000 MCG/ML KIT Inject 1,000 Units as directed.    Marland Kitchen GLUCOSAMINE-CHONDROITIN PO Take 2 tablets by mouth daily.    Marland Kitchen levothyroxine (SYNTHROID, LEVOTHROID) 50 MCG tablet Take 1 tablet (50 mcg total) by mouth daily before breakfast. (Patient taking differently: Take 75 mcg by mouth daily before breakfast. ) 90 tablet 3  . magnesium oxide (MAG-OX) 400 (241.3 Mg) MG tablet TAKE ONE (1) TABLET EACH DAY 30 tablet 11  . Mesalamine (DELZICOL) 400 MG CPDR Take 400 mg by mouth 3 (three) times daily.    . pantoprazole (PROTONIX) 40 MG tablet Take 40 mg by mouth daily.     . Probiotic Product (ALIGN) 4 MG CAPS Take 1 capsule by mouth daily.    Marland Kitchen SYNTHROID 75 MCG tablet TAKE ONE (1) TABLET EACH DAY 90 tablet 0  .  triamcinolone (NASACORT) 55 MCG/ACT AERO nasal inhaler Place into the nose.    . Wheat Dextrin (BENEFIBER DRINK MIX) PACK Take by mouth.    . zolpidem (AMBIEN CR) 6.25 MG CR tablet Take 1 tablet (6.25 mg total) by mouth at bedtime as needed. for sleep 30 tablet 1   No current facility-administered medications on file prior to visit.     Social History  Substance Use Topics  . Smoking status: Never Smoker  . Smokeless tobacco: Never Used  . Alcohol use No    Review of Systems  Constitutional: Negative for chills and fever.  Respiratory: Negative for cough.   Cardiovascular: Negative for chest pain and palpitations.  Gastrointestinal: Negative for abdominal pain, nausea and vomiting.    Genitourinary: Negative for dysuria.  Musculoskeletal: Positive for myalgias. Negative for arthralgias.      Objective:    BP 122/84   Pulse (!) 58   Temp 97.9 F (36.6 C) (Oral)   Wt 166 lb 3.2 oz (75.4 kg)   SpO2 97%   BMI 32.46 kg/m    Physical Exam  Constitutional: She appears well-developed and well-nourished.  Eyes: Conjunctivae are normal.  Cardiovascular: Normal rate, regular rhythm, normal heart sounds and normal pulses.   Pulmonary/Chest: Effort normal and breath sounds normal. She has no wheezes. She has no rhonchi. She has no rales.  Musculoskeletal:  No pain, asymmetry, masses appreciated with palpation of bilateral thighs. Strength normal 5/5. Full range of motion with flexion and extension of bilateral lower extremities.  Neurological: She is alert.  Skin: Skin is warm and dry.  Psychiatric: She has a normal mood and affect. Her speech is normal and behavior is normal. Thought content normal.  Vitals reviewed.      Assessment & Plan:  1. Leg cramps Suspect leg cramps are aggravated by walking the patient has been doing. She is not having the cramps while in exam room. Pending lab work to evaluate for any electrolyte abnormalities or abnormalities of thyroid. Patient has responded well to muscle relaxant and I have given her the  muscle relaxant that she's taken in the past to take at bedtime as needed for cramps.  Advised her to be very careful muscle relaxants as it can make patients drowsy and can cause falls. Patient verbalized understanding   - Comprehensive metabolic panel - Magnesium - TSH - chlorzoxazone (PARAFON) 500 MG tablet; One tablet q hs prn  Dispense: 30 tablet; Refill: 0     I am having Ms. Kappes maintain her ALIGN, aspirin, pantoprazole, Calcium Citrate (CITRACAL PO), Mesalamine, Cholecalciferol (D3-1000 PO), GLUCOSAMINE-CHONDROITIN PO, B-12, BENEFIBER DRINK MIX, levothyroxine, calcitonin (salmon), benazepril, triamcinolone, magnesium  oxide, zolpidem, SYNTHROID, and atenolol.   No orders of the defined types were placed in this encounter.   Return precautions given.   Risks, benefits, and alternatives of the medications and treatment plan prescribed today were discussed, and patient expressed understanding.   Education regarding symptom management and diagnosis given to patient on AVS.  Continue to follow with Einar Pheasant, MD for routine health maintenance.   Lori [name] and I agreed with plan.   Mable Paris, FNP

## 2016-10-04 NOTE — Patient Instructions (Addendum)
Labs  Let me know if muscle cramps improve on medication. Please see very careful with medication as it is a muscle relaxant. do not operate heavy machinery with this medication or drink alcohol.

## 2016-10-08 ENCOUNTER — Other Ambulatory Visit: Payer: Self-pay | Admitting: Internal Medicine

## 2016-10-11 DIAGNOSIS — M1711 Unilateral primary osteoarthritis, right knee: Secondary | ICD-10-CM | POA: Diagnosis not present

## 2016-10-22 ENCOUNTER — Other Ambulatory Visit: Payer: Self-pay

## 2016-10-22 MED ORDER — ZOLPIDEM TARTRATE ER 6.25 MG PO TBCR: 6.2500 mg | EXTENDED_RELEASE_TABLET | Freq: Every evening | ORAL | 1 refills | Status: DC | PRN

## 2016-10-22 NOTE — Progress Notes (Signed)
Filled 08/16/16 30 1  rf  Addendum - ok'd refill for ambien #30 with one refill.

## 2016-10-30 ENCOUNTER — Ambulatory Visit (INDEPENDENT_AMBULATORY_CARE_PROVIDER_SITE_OTHER): Payer: Medicare Other

## 2016-10-30 DIAGNOSIS — E538 Deficiency of other specified B group vitamins: Secondary | ICD-10-CM | POA: Diagnosis not present

## 2016-10-30 MED ORDER — CYANOCOBALAMIN 1000 MCG/ML IJ SOLN
1000.0000 ug | Freq: Once | INTRAMUSCULAR | Status: AC
  Administered 2016-10-30: 1000 ug via INTRAMUSCULAR

## 2016-10-30 NOTE — Progress Notes (Signed)
Patient presents for vitamin B12 injection.  Injected left deltoid patient tolerated injection well.

## 2016-10-31 NOTE — Progress Notes (Signed)
I have reviewed the above note and agree.  Raman Featherston, M.D.  

## 2016-11-07 ENCOUNTER — Other Ambulatory Visit: Payer: Self-pay | Admitting: Internal Medicine

## 2016-11-15 ENCOUNTER — Other Ambulatory Visit: Payer: Self-pay | Admitting: Internal Medicine

## 2016-11-17 ENCOUNTER — Emergency Department
Admission: EM | Admit: 2016-11-17 | Discharge: 2016-11-17 | Disposition: A | Discharge status: Home / Self Care | Payer: Medicare Other | Attending: Emergency Medicine | Admitting: Emergency Medicine

## 2016-11-17 ENCOUNTER — Emergency Department: Payer: Medicare Other

## 2016-11-17 ENCOUNTER — Encounter: Payer: Self-pay | Admitting: Emergency Medicine

## 2016-11-17 DIAGNOSIS — Y9301 Activity, walking, marching and hiking: Secondary | ICD-10-CM | POA: Diagnosis not present

## 2016-11-17 DIAGNOSIS — W010XXA Fall on same level from slipping, tripping and stumbling without subsequent striking against object, initial encounter: Secondary | ICD-10-CM | POA: Diagnosis not present

## 2016-11-17 DIAGNOSIS — N189 Chronic kidney disease, unspecified: Secondary | ICD-10-CM | POA: Diagnosis not present

## 2016-11-17 DIAGNOSIS — Y999 Unspecified external cause status: Secondary | ICD-10-CM | POA: Insufficient documentation

## 2016-11-17 DIAGNOSIS — I129 Hypertensive chronic kidney disease with stage 1 through stage 4 chronic kidney disease, or unspecified chronic kidney disease: Secondary | ICD-10-CM | POA: Insufficient documentation

## 2016-11-17 DIAGNOSIS — S43014A Anterior dislocation of right humerus, initial encounter: Secondary | ICD-10-CM | POA: Insufficient documentation

## 2016-11-17 DIAGNOSIS — S43034A Inferior dislocation of right humerus, initial encounter: Secondary | ICD-10-CM | POA: Diagnosis not present

## 2016-11-17 DIAGNOSIS — S4991XA Unspecified injury of right shoulder and upper arm, initial encounter: Secondary | ICD-10-CM | POA: Diagnosis present

## 2016-11-17 DIAGNOSIS — E039 Hypothyroidism, unspecified: Secondary | ICD-10-CM | POA: Insufficient documentation

## 2016-11-17 DIAGNOSIS — Y9289 Other specified places as the place of occurrence of the external cause: Secondary | ICD-10-CM | POA: Diagnosis not present

## 2016-11-17 DIAGNOSIS — Z79899 Other long term (current) drug therapy: Secondary | ICD-10-CM | POA: Diagnosis not present

## 2016-11-17 DIAGNOSIS — Z7982 Long term (current) use of aspirin: Secondary | ICD-10-CM | POA: Insufficient documentation

## 2016-11-17 DIAGNOSIS — M25519 Pain in unspecified shoulder: Secondary | ICD-10-CM

## 2016-11-17 DIAGNOSIS — M25511 Pain in right shoulder: Secondary | ICD-10-CM | POA: Diagnosis not present

## 2016-11-17 DIAGNOSIS — S4291XA Fracture of right shoulder girdle, part unspecified, initial encounter for closed fracture: Secondary | ICD-10-CM

## 2016-11-17 MED ORDER — SODIUM CHLORIDE 0.9 % IV BOLUS (SEPSIS): 1000.0000 mL | Freq: Once | INTRAVENOUS | Status: DC

## 2016-11-17 MED ORDER — HYDROCODONE-ACETAMINOPHEN 5-325 MG PO TABS: 1.0000 | ORAL_TABLET | ORAL | 0 refills | Status: DC | PRN

## 2016-11-17 MED ORDER — SODIUM CHLORIDE 0.9 % IV SOLN: 80.0000 mg | Freq: Once | INTRAVENOUS | Status: DC

## 2016-11-17 MED ORDER — ETOMIDATE 2 MG/ML IV SOLN
10.0000 mg | Freq: Once | INTRAVENOUS | Status: AC
  Administered 2016-11-17: 8 mg via INTRAVENOUS
  Filled 2016-11-17: qty 10

## 2016-11-17 MED ORDER — ETOMIDATE 2 MG/ML IV SOLN
INTRAVENOUS | Status: AC | PRN
  Administered 2016-11-17: 8 mg via INTRAVENOUS

## 2016-11-17 MED ORDER — FENTANYL CITRATE (PF) 100 MCG/2ML IJ SOLN
50.0000 ug | Freq: Once | INTRAMUSCULAR | Status: AC
  Administered 2016-11-17: 50 ug via INTRAVENOUS

## 2016-11-17 MED ORDER — SODIUM CHLORIDE 0.9 % IV SOLN: 8.0000 mg/h | INTRAVENOUS | Status: DC

## 2016-11-17 NOTE — Discharge Instructions (Signed)
Please use her immobilizer for the next 2 weeks. Please follow-up with orthopedics by calling the number provided on Monday to arrange an appointment in the next 7-10 days. Return to the emergency department for any worsening pain.

## 2016-11-17 NOTE — ED Notes (Signed)
X-ray at bedside at this time.

## 2016-11-17 NOTE — ED Notes (Signed)
Pt denies pain at this time. Alert and oriented family at bedside.

## 2016-11-17 NOTE — ED Notes (Signed)
Shoulder immobilizer placed to right shoulder at this time

## 2016-11-17 NOTE — ED Notes (Signed)
Pt sitting up in bed alert and oriented. No complaints at this time.

## 2016-11-17 NOTE — ED Provider Notes (Signed)
[facility] Emergency Department Provider Note  Time seen: 10:58 AM  I have reviewed the triage vital signs and the nursing notes.   HISTORY  Chief Complaint Shoulder Pain    HPI Lori [name] is a 82 y.o. female who presents to the emergency department with right shoulder pain. According to the patient she was walking around an auction this morning when she tripped on a step falling forward. Attempted to catch herself with her right arm felt immediate pain to the right shoulder. Denies hitting her head. Denies LOC. Denies other injuries per patient was able to drive herself to a friend's house after the incident without difficulty. Describes her pain in the right shoulder as 0 when lying still, significant with any attempted movement.  Past Medical History:  Diagnosis Date  . Anemia    iron deficient  . Autoimmune hepatitis ([place]) 05/10/2016  . B12 deficiency   . Chronic kidney disease   . Diverticulosis   . Fibrocystic breast disease   . GERD (gastroesophageal reflux disease)   . Hypercholesterolemia   . Hypertension   . Hypothyroidism   . Ulcer disease     Patient Active Problem List   Diagnosis Date Noted  . Hyperglycemia 09/10/2016  . Fullness of breast 09/10/2016  . Autoimmune hepatitis ([place]) 05/10/2016  . URI (upper respiratory infection) 03/18/2016  . MGUS (monoclonal gammopathy of unknown significance) 06/06/2015  . LUQ fullness 06/06/2015  . Health care maintenance 06/06/2015  . Fatty tumor 03/07/2015  . Obesity (BMI 30-39.9) 01/02/2015  . Abnormal liver function tests 01/02/2015  . Hand cramps 11/15/2014  . Obesity 07/17/2014  . Leg cramps 06/13/2014  . Osteopenia 03/02/2013  . Hyperbilirubinemia 03/02/2013  . Hypertension 12/22/2012  . Gastroparesis 12/22/2012  . Barrett's esophagus with esophagitis 12/22/2012  . Hypothyroidism 12/22/2012  . Anemia 12/22/2012  . B12 deficiency 12/22/2012  . Renal insufficiency 12/22/2012     Past Surgical History:  Procedure Laterality Date  . ESOPHAGOGASTRODUODENOSCOPY (EGD) WITH PROPOFOL N/A 01/30/2016   Procedure: ESOPHAGOGASTRODUODENOSCOPY (EGD) WITH PROPOFOL;  Surgeon: Lollie Sails, MD;  Location: [facility] ENDOSCOPY;  Service: Endoscopy;  Laterality: N/A;  . HERNIA REPAIR    . lap hiatal hernia repair  07/08/08  . LEFT OOPHORECTOMY  1993   benign ovarian cyst  . TUBAL LIGATION  1981    Prior to Admission medications   Medication Sig Start Date End Date Taking? Authorizing Provider  aspirin 81 MG tablet Take 81 mg by mouth daily.    Historical Provider, MD  atenolol (TENORMIN) 25 MG tablet TAKE ONE (1) TABLET BY MOUTH EVERY DAY 09/27/16   Einar Pheasant, MD  benazepril (LOTENSIN) 20 MG tablet TAKE ONE (1) TABLET BY MOUTH EVERY DAY 02/21/16   Einar Pheasant, MD  calcitonin, salmon, (MIACALCIN/FORTICAL) 200 UNIT/ACT nasal spray USE ONE (1) SPRAY INTO ONE NOSTRIL DAILY; ALTERNATE NOSTRILS 11/07/16   Einar Pheasant, MD  Calcium Citrate (CITRACAL PO) Take 1,200 mg by mouth daily.    Historical Provider, MD  chlorzoxazone (PARAFON) 500 MG tablet One tablet q hs prn 10/04/16   Burnard Hawthorne, FNP  Cholecalciferol (D3-1000 PO) Take by mouth daily.    Historical Provider, MD  Cyanocobalamin (B-12) 1000 MCG/ML KIT Inject 1,000 Units as directed.    Historical Provider, MD  GLUCOSAMINE-CHONDROITIN PO Take 2 tablets by mouth daily.    Historical Provider, MD  levothyroxine (SYNTHROID, LEVOTHROID) 50 MCG tablet Take 1 tablet (50 mcg total) by mouth daily before breakfast. Patient taking differently:  Take 75 mcg by mouth daily before breakfast.  09/07/15   Einar Pheasant, MD  magnesium oxide (MAG-OX) 400 (241.3 Mg) MG tablet TAKE ONE (1) TABLET EACH DAY 03/27/16   Einar Pheasant, MD  Mesalamine (DELZICOL) 400 MG CPDR Take 400 mg by mouth 3 (three) times daily.    Historical Provider, MD  pantoprazole (PROTONIX) 40 MG tablet Take 40 mg by mouth daily.     Historical Provider, MD   Probiotic Product (ALIGN) 4 MG CAPS Take 1 capsule by mouth daily.    Historical Provider, MD  SYNTHROID 75 MCG tablet TAKE ONE (1) TABLET BY MOUTH EVERY DAY 11/16/16   Einar Pheasant, MD  triamcinolone (NASACORT) 55 MCG/ACT AERO nasal inhaler Place into the nose.    Historical Provider, MD  Wheat Dextrin (BENEFIBER DRINK MIX) PACK Take by mouth.    Historical Provider, MD  zolpidem (AMBIEN CR) 6.25 MG CR tablet Take 1 tablet (6.25 mg total) by mouth at bedtime as needed. for sleep 10/22/16   Einar Pheasant, MD    Allergies  Allergen Reactions  . Penicillins   . Daypro [Oxaprozin] Rash  . Lodine [Etodolac] Rash    Family History  Problem Relation Age of Onset  . Stroke Mother   . Breast cancer Neg Hx   . Colon cancer Neg Hx     Social History Social History  Substance Use Topics  . Smoking status: Never Smoker  . Smokeless tobacco: Never Used  . Alcohol use No    Review of Systems Constitutional: Negative for fever. Cardiovascular: Negative for chest pain. Respiratory: Negative for shortness of breath. Gastrointestinal: Negative for abdominal pain Musculoskeletal: Right shoulder pain Neurological: Negative for focal weakness or numbness. 10-point ROS otherwise negative.  ____________________________________________   PHYSICAL EXAM:  VITAL SIGNS: ED Triage Vitals  Enc Vitals Group     BP 11/17/16 1000 (!) 184/63     Pulse Rate 11/17/16 1000 (!) 58     Resp 11/17/16 1000 18     Temp 11/17/16 1000 98 F (36.7 C)     Temp Source 11/17/16 1000 Oral     SpO2 11/17/16 1000 98 %     Weight 11/17/16 1003 160 lb (72.6 kg)     Height 11/17/16 1003 5' (1.524 m)     Head Circumference --      Peak Flow --      Pain Score 11/17/16 1003 4     Pain Loc --      Pain Edu? --      Excl. in [place]? --     Constitutional: Alert and oriented. Well appearing and in no distress. Eyes: Normal exam ENT   Head: Normocephalic and atraumatic.   Mouth/Throat: Mucous membranes  are moist. Cardiovascular: Normal rate, regular rhythm. No murmur Respiratory: Normal respiratory effort without tachypnea nor retractions. Breath sounds are clear  Gastrointestinal: Soft and nontender. No distention. Musculoskeletal: Moderate right shoulder tenderness palpation. Good grip strength, neurovascular intact distally with 2+ radial pulse. Neurologic:  Normal speech and language. No gross focal neurologic deficits  Skin:  Skin is warm, dry and intact.  Psychiatric: Mood and affect are normal.   ____________________________________________      RADIOLOGY  Anterior right shoulder dislocation  Status post reduction x-ray shows reduced shoulder  ____________________________________________   INITIAL IMPRESSION / ASSESSMENT AND PLAN / ED COURSE  Pertinent labs & imaging results that were available during my care of the patient were reviewed by me and considered in my medical decision  making (see chart for details).  Patient presents with right shoulder pain after fall. X-ray consistent with anterior right shoulder dislocation with small avulsion fragment. Exam is otherwise atraumatic. Neurovascular intact distally.   Procedural sedation Performed by: Harvest Dark Consent: Verbal consent obtained. Risks and benefits: risks, benefits and alternatives were discussed Required items: required blood products, implants, devices, and special equipment available Patient identity confirmed: arm band and provided demographic data Time out: Immediately prior to procedure a "time out" was called to verify the correct patient, procedure, equipment, support staff and site/side marked as required.  Sedation type: moderate (conscious) sedation NPO time confirmed and considedered  Sedatives: ETOMIDATE  Physician Time at Bedside: 25 minutes  Vitals: Vital signs were monitored during sedation. Cardiac Monitor, pulse oximeter Patient tolerance: Patient tolerated the procedure  well with no immediate complications. Comments: Pt with uneventful recovered. Returned to pre-procedural sedation baseline  Reduction of dislocation Date/Time: 1:55 PM Performed by: Harvest Dark Authorized by: Harvest Dark Consent: Verbal consent obtained. Risks and benefits: risks, benefits and alternatives were discussed Consent given by: patient Required items: required blood products, implants, devices, and special equipment available Time out: Immediately prior to procedure a "time out" was called to verify the correct patient, procedure, equipment, support staff and site/side marked as required.  Patient sedated: Etomidate  Vitals: Vital signs were monitored during sedation. Patient tolerance: Patient tolerated the procedure well with no immediate complications. Joint: Right shoulder Reduction technique: Traction/countertraction   Patient tolerated reduction and sedation very well. ____________________________________________   FINAL CLINICAL IMPRESSION(S) / ED DIAGNOSES  Right shoulder dislocation    Harvest Dark, MD 11/17/16 1356

## 2016-11-17 NOTE — ED Triage Notes (Signed)
Fell approx 0915, missed step, pain R shoulder.

## 2016-11-20 DIAGNOSIS — S43014A Anterior dislocation of right humerus, initial encounter: Secondary | ICD-10-CM | POA: Diagnosis not present

## 2016-11-29 ENCOUNTER — Ambulatory Visit (INDEPENDENT_AMBULATORY_CARE_PROVIDER_SITE_OTHER): Payer: Medicare Other

## 2016-11-29 DIAGNOSIS — E538 Deficiency of other specified B group vitamins: Secondary | ICD-10-CM | POA: Diagnosis not present

## 2016-11-29 MED ORDER — CYANOCOBALAMIN 1000 MCG/ML IJ SOLN
1000.0000 ug | Freq: Once | INTRAMUSCULAR | Status: AC
  Administered 2016-11-29: 1000 ug via INTRAMUSCULAR

## 2016-11-29 NOTE — Progress Notes (Addendum)
Patient comes in for B 12 injection.  Injected left deltoid due to patient has fracture of right shoulder .   Patient tolerated injection well.    Reviewed.   Dr Nicki Reaper

## 2016-12-03 ENCOUNTER — Inpatient Hospital Stay: Payer: Medicare Other | Attending: Oncology

## 2016-12-03 ENCOUNTER — Other Ambulatory Visit: Payer: Self-pay

## 2016-12-03 DIAGNOSIS — Z79899 Other long term (current) drug therapy: Secondary | ICD-10-CM | POA: Diagnosis not present

## 2016-12-03 DIAGNOSIS — E538 Deficiency of other specified B group vitamins: Secondary | ICD-10-CM | POA: Diagnosis not present

## 2016-12-03 DIAGNOSIS — S43014A Anterior dislocation of right humerus, initial encounter: Secondary | ICD-10-CM | POA: Insufficient documentation

## 2016-12-03 DIAGNOSIS — E7800 Pure hypercholesterolemia, unspecified: Secondary | ICD-10-CM | POA: Diagnosis not present

## 2016-12-03 DIAGNOSIS — K754 Autoimmune hepatitis: Secondary | ICD-10-CM | POA: Insufficient documentation

## 2016-12-03 DIAGNOSIS — D472 Monoclonal gammopathy: Secondary | ICD-10-CM | POA: Insufficient documentation

## 2016-12-03 DIAGNOSIS — E039 Hypothyroidism, unspecified: Secondary | ICD-10-CM | POA: Diagnosis not present

## 2016-12-03 DIAGNOSIS — Z8711 Personal history of peptic ulcer disease: Secondary | ICD-10-CM | POA: Insufficient documentation

## 2016-12-03 DIAGNOSIS — K219 Gastro-esophageal reflux disease without esophagitis: Secondary | ICD-10-CM | POA: Diagnosis not present

## 2016-12-03 DIAGNOSIS — K5790 Diverticulosis of intestine, part unspecified, without perforation or abscess without bleeding: Secondary | ICD-10-CM | POA: Insufficient documentation

## 2016-12-03 DIAGNOSIS — N189 Chronic kidney disease, unspecified: Secondary | ICD-10-CM | POA: Diagnosis not present

## 2016-12-03 DIAGNOSIS — Z7982 Long term (current) use of aspirin: Secondary | ICD-10-CM | POA: Insufficient documentation

## 2016-12-03 DIAGNOSIS — N6019 Diffuse cystic mastopathy of unspecified breast: Secondary | ICD-10-CM | POA: Insufficient documentation

## 2016-12-03 DIAGNOSIS — I129 Hypertensive chronic kidney disease with stage 1 through stage 4 chronic kidney disease, or unspecified chronic kidney disease: Secondary | ICD-10-CM | POA: Diagnosis not present

## 2016-12-03 DIAGNOSIS — D509 Iron deficiency anemia, unspecified: Secondary | ICD-10-CM | POA: Insufficient documentation

## 2016-12-03 LAB — CBC WITH DIFFERENTIAL/PLATELET
BASOS ABS: 0 10*3/uL (ref 0–0.1)
Basophils Relative: 1 %
Eosinophils Absolute: 0.1 10*3/uL (ref 0–0.7)
Eosinophils Relative: 2 %
HEMATOCRIT: 37.2 % (ref 35.0–47.0)
Hemoglobin: 12.6 g/dL (ref 12.0–16.0)
LYMPHS PCT: 12 %
Lymphs Abs: 0.5 10*3/uL — ABNORMAL LOW (ref 1.0–3.6)
MCH: 30 pg (ref 26.0–34.0)
MCHC: 33.8 g/dL (ref 32.0–36.0)
MCV: 88.7 fL (ref 80.0–100.0)
MONO ABS: 0.3 10*3/uL (ref 0.2–0.9)
Monocytes Relative: 7 %
NEUTROS ABS: 3.5 10*3/uL (ref 1.4–6.5)
Neutrophils Relative %: 78 %
Platelets: 163 10*3/uL (ref 150–440)
RBC: 4.19 MIL/uL (ref 3.80–5.20)
RDW: 13.2 % (ref 11.5–14.5)
WBC: 4.4 10*3/uL (ref 3.6–11.0)

## 2016-12-03 LAB — BASIC METABOLIC PANEL
ANION GAP: 9 (ref 5–15)
BUN: 28 mg/dL — ABNORMAL HIGH (ref 6–20)
CALCIUM: 9.2 mg/dL (ref 8.9–10.3)
CO2: 20 mmol/L — AB (ref 22–32)
Chloride: 108 mmol/L (ref 101–111)
Creatinine, Ser: 0.79 mg/dL (ref 0.44–1.00)
GFR calc Af Amer: >60 mL/min (ref >60)
GFR calc non Af Amer: >60 mL/min (ref >60)
GLUCOSE: 113 mg/dL — AB (ref 65–99)
POTASSIUM: 3.7 mmol/L (ref 3.5–5.1)
Sodium: 137 mmol/L (ref 135–145)

## 2016-12-04 DIAGNOSIS — S43014D Anterior dislocation of right humerus, subsequent encounter: Secondary | ICD-10-CM | POA: Diagnosis not present

## 2016-12-04 LAB — KAPPA/LAMBDA LIGHT CHAINS
KAPPA FREE LGHT CHN: 55.1 mg/L — AB (ref 3.3–19.4)
Kappa, lambda light chain ratio: 2.03 — ABNORMAL HIGH (ref 0.26–1.65)
Lambda free light chains: 27.1 mg/L — ABNORMAL HIGH (ref 5.7–26.3)

## 2016-12-05 LAB — IMMUNOFIXATION ELECTROPHORESIS
IGM, SERUM: 78 mg/dL (ref 26–217)
IgA: 385 mg/dL (ref 64–422)
IgG (Immunoglobin G), Serum: 1615 mg/dL — ABNORMAL HIGH (ref 700–1600)
TOTAL PROTEIN ELP: 7.2 g/dL (ref 6.0–8.5)

## 2016-12-06 DIAGNOSIS — H2513 Age-related nuclear cataract, bilateral: Secondary | ICD-10-CM | POA: Diagnosis not present

## 2016-12-06 LAB — PROTEIN ELECTROPHORESIS, SERUM
A/G Ratio: 1 (ref 0.7–1.7)
ALBUMIN ELP: 3.5 g/dL (ref 2.9–4.4)
ALPHA-1-GLOBULIN: 0.2 g/dL (ref 0.0–0.4)
ALPHA-2-GLOBULIN: 0.6 g/dL (ref 0.4–1.0)
Beta Globulin: 1.2 g/dL (ref 0.7–1.3)
GAMMA GLOBULIN: 1.6 g/dL (ref 0.4–1.8)
Globulin, Total: 3.6 g/dL (ref 2.2–3.9)
TOTAL PROTEIN ELP: 7.1 g/dL (ref 6.0–8.5)

## 2016-12-09 NOTE — Progress Notes (Signed)
Lori [name]  [phone] [phone]  ID: Lori [name] OB: 1934/10/22  MR#: [id number]  YFV#:[id number]  Patient Care Team: Einar Pheasant, MD as PCP - General (Internal Medicine)  CHIEF COMPLAINT: MGUS  INTERVAL HISTORY: Patient returns to clinic today for repeat laboratory work and routine six-month follow-up. Currently, she feels well and is asymptomatic. She has no neurologic complaints. She denies any recent fevers or illnesses. She has a good appetite and denies weight loss. She denies any pain. She has no chest pain or shortness of breath. She denies any nausea, vomiting, constipation, or diarrhea. She has no urinary complaints. Patient offers no specific complaints today.  REVIEW OF SYSTEMS:   Review of Systems  Constitutional: Negative.  Negative for fever, malaise/fatigue and weight loss.  Respiratory: Negative.  Negative for cough and shortness of breath.   Cardiovascular: Negative.  Negative for chest pain and leg swelling.  Gastrointestinal: Negative.  Negative for abdominal pain.  Genitourinary: Negative.   Musculoskeletal: Negative.   Neurological: Negative.  Negative for sensory change and weakness.  Psychiatric/Behavioral: Negative.  The patient is not nervous/anxious.     As per HPI. Otherwise, a complete review of systems is negative.  PAST MEDICAL HISTORY: Past Medical History:  Diagnosis Date  . Anemia    iron deficient  . Autoimmune hepatitis ([place]) 05/10/2016  . B12 deficiency   . Chronic kidney disease   . Diverticulosis   . Fibrocystic breast disease   . GERD (gastroesophageal reflux disease)   . Hypercholesterolemia   . Hypertension   . Hypothyroidism   . Ulcer disease     PAST SURGICAL HISTORY: Past Surgical History:  Procedure Laterality Date  . ESOPHAGOGASTRODUODENOSCOPY (EGD) WITH PROPOFOL N/A 01/30/2016   Procedure: ESOPHAGOGASTRODUODENOSCOPY (EGD) WITH PROPOFOL;  Surgeon: Lollie Sails, MD;   Location: [facility] ENDOSCOPY;  Service: Endoscopy;  Laterality: N/A;  . HERNIA REPAIR    . lap hiatal hernia repair  07/08/08  . LEFT OOPHORECTOMY  1993   benign ovarian cyst  . TUBAL LIGATION  1981    FAMILY HISTORY Family History  Problem Relation Age of Onset  . Stroke Mother   . Breast cancer Neg Hx   . Colon cancer Neg Hx        ADVANCED DIRECTIVES:    HEALTH MAINTENANCE: Social History  Substance Use Topics  . Smoking status: Never Smoker  . Smokeless tobacco: Never Used  . Alcohol use No     Colonoscopy:  PAP:  Bone density:  Lipid panel:  Allergies  Allergen Reactions  . Penicillins   . Daypro [Oxaprozin] Rash  . Lodine [Etodolac] Rash    Current Outpatient Prescriptions  Medication Sig Dispense Refill  . aspirin 81 MG tablet Take 81 mg by mouth daily.    Marland Kitchen atenolol (TENORMIN) 25 MG tablet TAKE ONE (1) TABLET BY MOUTH EVERY DAY 90 tablet 0  . benazepril (LOTENSIN) 20 MG tablet TAKE ONE (1) TABLET BY MOUTH EVERY DAY 90 tablet 3  . calcitonin, salmon, (MIACALCIN/FORTICAL) 200 UNIT/ACT nasal spray USE ONE (1) SPRAY INTO ONE NOSTRIL DAILY; ALTERNATE NOSTRILS 3.7 mL 0  . Calcium Citrate (CITRACAL PO) Take 1,200 mg by mouth daily.    . chlorzoxazone (PARAFON) 500 MG tablet One tablet q hs prn 30 tablet 0  . Cholecalciferol (D3-1000 PO) Take by mouth daily.    . Cyanocobalamin (B-12) 1000 MCG/ML KIT Inject 1,000 Units as directed.    Marland Kitchen GLUCOSAMINE-CHONDROITIN PO Take 2 tablets by mouth daily.    Marland Kitchen  levothyroxine (SYNTHROID, LEVOTHROID) 75 MCG tablet Take 75 mcg by mouth daily before breakfast.    . magnesium oxide (MAG-OX) 400 (241.3 Mg) MG tablet TAKE ONE (1) TABLET EACH DAY 30 tablet 11  . Mesalamine (DELZICOL) 400 MG CPDR Take 400 mg by mouth 3 (three) times daily.    . pantoprazole (PROTONIX) 40 MG tablet Take 40 mg by mouth daily.     . Probiotic Product (ALIGN) 4 MG CAPS Take 1 capsule by mouth daily.    Marland Kitchen triamcinolone (NASACORT) 55 MCG/ACT AERO nasal inhaler  Place into the nose.    . Wheat Dextrin (BENEFIBER DRINK MIX) PACK Take by mouth.    . zolpidem (AMBIEN CR) 6.25 MG CR tablet Take 1 tablet (6.25 mg total) by mouth at bedtime as needed. for sleep 30 tablet 1   No current facility-administered medications for this visit.     OBJECTIVE: Vitals:   12/10/16 1057  BP: (!) 148/80  Pulse: (!) 57  Resp: 19  Temp: 98 F (36.7 C)     Body mass index is 32.96 kg/m.    ECOG FS:0 - Asymptomatic  General: Well-developed, well-nourished, no acute distress. Eyes: anicteric sclera. Lungs: Clear to auscultation bilaterally. Heart: Regular rate and rhythm. No rubs, murmurs, or gallops. Abdomen: Soft, nontender, nondistended. No organomegaly noted, normoactive bowel sounds. Musculoskeletal: No edema, cyanosis, or clubbing. Neuro: Alert, answering all questions appropriately. Cranial nerves grossly intact. Skin: No rashes or petechiae noted. Psych: Normal affect.    LAB RESULTS:  Lab Results  Component Value Date   NA 137 12/03/2016   K 3.7 12/03/2016   CL 108 12/03/2016   CO2 20 (L) 12/03/2016   GLUCOSE 113 (H) 12/03/2016   BUN 28 (H) 12/03/2016   CREATININE 0.79 12/03/2016   CALCIUM 9.2 12/03/2016   PROT 7.7 10/04/2016   ALBUMIN 3.8 10/04/2016   AST 18 10/04/2016   ALT 13 10/04/2016   ALKPHOS 41 10/04/2016   BILITOT 0.6 10/04/2016   GFRNONAA >60 12/03/2016   GFRAA >60 12/03/2016    Lab Results  Component Value Date   WBC 4.4 12/03/2016   NEUTROABS 3.5 12/03/2016   HGB 12.6 12/03/2016   HCT 37.2 12/03/2016   MCV 88.7 12/03/2016   PLT 163 12/03/2016   Lab Results  Component Value Date   TOTALPROTELP 7.1 12/03/2016   TOTALPROTELP 7.2 12/03/2016   ALBUMINELP 3.5 12/03/2016   A1GS 0.2 12/03/2016   A2GS 0.6 12/03/2016   BETS 1.2 12/03/2016   BETA2SER 5.0 01/11/2015   GAMS 1.6 12/03/2016   MSPIKE Not Observed 12/03/2016   SPEI Comment 12/03/2016     STUDIES: Dg Shoulder Right  Result Date: 11/17/2016 CLINICAL  DATA:  Fall. Right shoulder pain and deformity. Limited range of motion. EXAM: RIGHT SHOULDER - 2+ VIEW COMPARISON:  None. FINDINGS: There is anterior right shoulder dislocation. Small avulsed bone fragments noted superior to the humeral head adjacent to the glenoid. AC joint is intact. IMPRESSION: Anterior right shoulder dislocation with small avulsed fragments. Electronically Signed   By: Rolm Baptise M.D.   On: 11/17/2016 10:35   Dg Shoulder Right Port  Result Date: 11/17/2016 CLINICAL DATA:  Post reduction of shoulder dislocation. EXAM: PORTABLE RIGHT SHOULDER COMPARISON:  Right shoulder radiographs-earlier same day FINDINGS: Interval reduction of previously noted anterior inferior glenohumeral joint shoulder dislocation. [place] adjacent to the greater tuberosity likely represent the sequela of age-indeterminate avulsive injury, less likely the sequela of calcific tendinitis. No definitive Hill-Sachs or bony [place] deformity on this  portable examination. Regional soft tissues appear normal. Limited visualization of the adjacent thorax is normal. IMPRESSION: 1. Interval reduction of anterior inferior glenohumeral joint shoulder dislocation. 2. [place]icles adjacent to the greater tuberosity likely represent the sequela of age-indeterminate avulsive injury, less likely, the sequela of calcific tendinitis. Electronically Signed   By: Sandi Mariscal M.D.   On: 11/17/2016 12:59    ASSESSMENT: MGUS, resolved   PLAN:    1.  MGUS: SIEP and UIEP are negative. She has no evidence of endorgan damage. Patient only has a mildly elevated kappa/lambda ratio which has been unchanged for over a year. No intervention is needed at this time. Patient does not require bone marrow biopsy. After discussion with the patient, it was agreed upon the no further follow-up is necessary. Continue follow-up with primary care as indicated.  Patient expressed understanding and was in agreement with this plan. She also understands that  She can call clinic at any time with any questions, concerns, or complaints.    Lloyd Huger, MD   12/10/2016 1:20 PM

## 2016-12-10 ENCOUNTER — Inpatient Hospital Stay (HOSPITAL_BASED_OUTPATIENT_CLINIC_OR_DEPARTMENT_OTHER): Payer: Medicare Other | Admitting: Oncology

## 2016-12-10 VITALS — BP 148/80 | HR 57 | Temp 98.0°F | Resp 19 | Wt 168.8 lb

## 2016-12-10 DIAGNOSIS — D509 Iron deficiency anemia, unspecified: Secondary | ICD-10-CM | POA: Diagnosis not present

## 2016-12-10 DIAGNOSIS — K754 Autoimmune hepatitis: Secondary | ICD-10-CM

## 2016-12-10 DIAGNOSIS — S43014A Anterior dislocation of right humerus, initial encounter: Secondary | ICD-10-CM | POA: Diagnosis not present

## 2016-12-10 DIAGNOSIS — Z8711 Personal history of peptic ulcer disease: Secondary | ICD-10-CM

## 2016-12-10 DIAGNOSIS — I129 Hypertensive chronic kidney disease with stage 1 through stage 4 chronic kidney disease, or unspecified chronic kidney disease: Secondary | ICD-10-CM

## 2016-12-10 DIAGNOSIS — E538 Deficiency of other specified B group vitamins: Secondary | ICD-10-CM | POA: Diagnosis not present

## 2016-12-10 DIAGNOSIS — N6019 Diffuse cystic mastopathy of unspecified breast: Secondary | ICD-10-CM

## 2016-12-10 DIAGNOSIS — Z79899 Other long term (current) drug therapy: Secondary | ICD-10-CM

## 2016-12-10 DIAGNOSIS — E039 Hypothyroidism, unspecified: Secondary | ICD-10-CM

## 2016-12-10 DIAGNOSIS — N189 Chronic kidney disease, unspecified: Secondary | ICD-10-CM

## 2016-12-10 DIAGNOSIS — D472 Monoclonal gammopathy: Secondary | ICD-10-CM

## 2016-12-10 DIAGNOSIS — E7800 Pure hypercholesterolemia, unspecified: Secondary | ICD-10-CM

## 2016-12-10 DIAGNOSIS — Z7982 Long term (current) use of aspirin: Secondary | ICD-10-CM

## 2016-12-10 DIAGNOSIS — K219 Gastro-esophageal reflux disease without esophagitis: Secondary | ICD-10-CM

## 2016-12-10 DIAGNOSIS — K5790 Diverticulosis of intestine, part unspecified, without perforation or abscess without bleeding: Secondary | ICD-10-CM

## 2016-12-10 NOTE — Progress Notes (Signed)
Offers no complaints. Feeling well. Recently fell and broke right shoulder, healing well.

## 2016-12-11 DIAGNOSIS — M25611 Stiffness of right shoulder, not elsewhere classified: Secondary | ICD-10-CM | POA: Diagnosis not present

## 2016-12-11 DIAGNOSIS — S43014D Anterior dislocation of right humerus, subsequent encounter: Secondary | ICD-10-CM | POA: Diagnosis not present

## 2016-12-11 DIAGNOSIS — M6281 Muscle weakness (generalized): Secondary | ICD-10-CM | POA: Diagnosis not present

## 2016-12-11 DIAGNOSIS — M25511 Pain in right shoulder: Secondary | ICD-10-CM | POA: Diagnosis not present

## 2016-12-17 ENCOUNTER — Other Ambulatory Visit: Payer: Self-pay | Admitting: Internal Medicine

## 2016-12-18 DIAGNOSIS — M6281 Muscle weakness (generalized): Secondary | ICD-10-CM | POA: Diagnosis not present

## 2016-12-18 DIAGNOSIS — M25511 Pain in right shoulder: Secondary | ICD-10-CM | POA: Diagnosis not present

## 2016-12-18 DIAGNOSIS — M25611 Stiffness of right shoulder, not elsewhere classified: Secondary | ICD-10-CM | POA: Diagnosis not present

## 2016-12-18 DIAGNOSIS — S43014D Anterior dislocation of right humerus, subsequent encounter: Secondary | ICD-10-CM | POA: Diagnosis not present

## 2016-12-18 NOTE — Telephone Encounter (Signed)
Last filled 11/19/16. Last OV 09/10/16. Has OV on 01/21/17.

## 2016-12-18 NOTE — Telephone Encounter (Signed)
ok'd refill for ambien #30 with one refill.

## 2016-12-20 DIAGNOSIS — M6281 Muscle weakness (generalized): Secondary | ICD-10-CM | POA: Diagnosis not present

## 2016-12-20 DIAGNOSIS — M25611 Stiffness of right shoulder, not elsewhere classified: Secondary | ICD-10-CM | POA: Diagnosis not present

## 2016-12-20 DIAGNOSIS — M25511 Pain in right shoulder: Secondary | ICD-10-CM | POA: Diagnosis not present

## 2016-12-20 DIAGNOSIS — S43014D Anterior dislocation of right humerus, subsequent encounter: Secondary | ICD-10-CM | POA: Diagnosis not present

## 2016-12-25 DIAGNOSIS — M6281 Muscle weakness (generalized): Secondary | ICD-10-CM | POA: Diagnosis not present

## 2016-12-25 DIAGNOSIS — S43014D Anterior dislocation of right humerus, subsequent encounter: Secondary | ICD-10-CM | POA: Diagnosis not present

## 2016-12-25 DIAGNOSIS — M25511 Pain in right shoulder: Secondary | ICD-10-CM | POA: Diagnosis not present

## 2016-12-25 DIAGNOSIS — M25611 Stiffness of right shoulder, not elsewhere classified: Secondary | ICD-10-CM | POA: Diagnosis not present

## 2016-12-27 DIAGNOSIS — M25511 Pain in right shoulder: Secondary | ICD-10-CM | POA: Diagnosis not present

## 2016-12-27 DIAGNOSIS — M25611 Stiffness of right shoulder, not elsewhere classified: Secondary | ICD-10-CM | POA: Diagnosis not present

## 2016-12-27 DIAGNOSIS — M6281 Muscle weakness (generalized): Secondary | ICD-10-CM | POA: Diagnosis not present

## 2016-12-27 DIAGNOSIS — S43014D Anterior dislocation of right humerus, subsequent encounter: Secondary | ICD-10-CM | POA: Diagnosis not present

## 2016-12-28 ENCOUNTER — Other Ambulatory Visit: Payer: Self-pay | Admitting: Internal Medicine

## 2017-01-01 ENCOUNTER — Ambulatory Visit (INDEPENDENT_AMBULATORY_CARE_PROVIDER_SITE_OTHER): Payer: Medicare Other

## 2017-01-01 DIAGNOSIS — E538 Deficiency of other specified B group vitamins: Secondary | ICD-10-CM

## 2017-01-01 DIAGNOSIS — M6281 Muscle weakness (generalized): Secondary | ICD-10-CM | POA: Diagnosis not present

## 2017-01-01 DIAGNOSIS — S43014D Anterior dislocation of right humerus, subsequent encounter: Secondary | ICD-10-CM | POA: Diagnosis not present

## 2017-01-01 DIAGNOSIS — M25511 Pain in right shoulder: Secondary | ICD-10-CM | POA: Diagnosis not present

## 2017-01-01 DIAGNOSIS — M25611 Stiffness of right shoulder, not elsewhere classified: Secondary | ICD-10-CM | POA: Diagnosis not present

## 2017-01-01 MED ORDER — CYANOCOBALAMIN 1000 MCG/ML IJ SOLN
1000.0000 ug | Freq: Once | INTRAMUSCULAR | Status: AC
  Administered 2017-01-01: 1000 ug via INTRAMUSCULAR

## 2017-01-01 NOTE — Progress Notes (Signed)
Patient comes in for B 12 injection.  Injected right deltoid.  Patient tolerated injection well.   

## 2017-01-02 ENCOUNTER — Other Ambulatory Visit: Payer: Self-pay | Admitting: Gastroenterology

## 2017-01-02 DIAGNOSIS — K754 Autoimmune hepatitis: Secondary | ICD-10-CM

## 2017-01-02 DIAGNOSIS — K760 Fatty (change of) liver, not elsewhere classified: Secondary | ICD-10-CM | POA: Diagnosis not present

## 2017-01-02 NOTE — Progress Notes (Signed)
  I have reviewed the above information and agree with above.   Ayren Zumbro, MD 

## 2017-01-08 DIAGNOSIS — M25611 Stiffness of right shoulder, not elsewhere classified: Secondary | ICD-10-CM | POA: Diagnosis not present

## 2017-01-08 DIAGNOSIS — M25511 Pain in right shoulder: Secondary | ICD-10-CM | POA: Diagnosis not present

## 2017-01-08 DIAGNOSIS — M6281 Muscle weakness (generalized): Secondary | ICD-10-CM | POA: Diagnosis not present

## 2017-01-08 DIAGNOSIS — S43014D Anterior dislocation of right humerus, subsequent encounter: Secondary | ICD-10-CM | POA: Diagnosis not present

## 2017-01-10 DIAGNOSIS — S43014D Anterior dislocation of right humerus, subsequent encounter: Secondary | ICD-10-CM | POA: Diagnosis not present

## 2017-01-10 DIAGNOSIS — M25511 Pain in right shoulder: Secondary | ICD-10-CM | POA: Diagnosis not present

## 2017-01-10 DIAGNOSIS — M25611 Stiffness of right shoulder, not elsewhere classified: Secondary | ICD-10-CM | POA: Diagnosis not present

## 2017-01-15 DIAGNOSIS — M25511 Pain in right shoulder: Secondary | ICD-10-CM | POA: Diagnosis not present

## 2017-01-15 DIAGNOSIS — M25611 Stiffness of right shoulder, not elsewhere classified: Secondary | ICD-10-CM | POA: Diagnosis not present

## 2017-01-15 DIAGNOSIS — M6281 Muscle weakness (generalized): Secondary | ICD-10-CM | POA: Diagnosis not present

## 2017-01-15 DIAGNOSIS — S43014D Anterior dislocation of right humerus, subsequent encounter: Secondary | ICD-10-CM | POA: Diagnosis not present

## 2017-01-21 ENCOUNTER — Encounter: Payer: Self-pay | Admitting: Internal Medicine

## 2017-01-21 ENCOUNTER — Ambulatory Visit (INDEPENDENT_AMBULATORY_CARE_PROVIDER_SITE_OTHER): Payer: Medicare Other | Admitting: Internal Medicine

## 2017-01-21 VITALS — BP 118/82 | HR 55 | Temp 98.3°F | Resp 16 | Wt 167.0 lb

## 2017-01-21 DIAGNOSIS — E2839 Other primary ovarian failure: Secondary | ICD-10-CM

## 2017-01-21 DIAGNOSIS — K754 Autoimmune hepatitis: Secondary | ICD-10-CM

## 2017-01-21 DIAGNOSIS — D649 Anemia, unspecified: Secondary | ICD-10-CM

## 2017-01-21 DIAGNOSIS — D472 Monoclonal gammopathy: Secondary | ICD-10-CM

## 2017-01-21 DIAGNOSIS — I10 Essential (primary) hypertension: Secondary | ICD-10-CM

## 2017-01-21 DIAGNOSIS — E538 Deficiency of other specified B group vitamins: Secondary | ICD-10-CM

## 2017-01-21 DIAGNOSIS — R739 Hyperglycemia, unspecified: Secondary | ICD-10-CM | POA: Diagnosis not present

## 2017-01-21 DIAGNOSIS — K209 Esophagitis, unspecified: Secondary | ICD-10-CM

## 2017-01-21 DIAGNOSIS — M8580 Other specified disorders of bone density and structure, unspecified site: Secondary | ICD-10-CM | POA: Diagnosis not present

## 2017-01-21 DIAGNOSIS — K2270 Barrett's esophagus without dysplasia: Secondary | ICD-10-CM

## 2017-01-21 DIAGNOSIS — E039 Hypothyroidism, unspecified: Secondary | ICD-10-CM

## 2017-01-21 DIAGNOSIS — K3184 Gastroparesis: Secondary | ICD-10-CM

## 2017-01-21 DIAGNOSIS — E669 Obesity, unspecified: Secondary | ICD-10-CM

## 2017-01-21 LAB — BASIC METABOLIC PANEL
BUN: 27 mg/dL — AB (ref 6–23)
CALCIUM: 9.8 mg/dL (ref 8.4–10.5)
CO2: 27 meq/L (ref 19–32)
Chloride: 104 mEq/L (ref 96–112)
Creatinine, Ser: 0.97 mg/dL (ref 0.40–1.20)
GFR: 58.68 mL/min — ABNORMAL LOW (ref >60.00)
Glucose, Bld: 111 mg/dL — ABNORMAL HIGH (ref 70–99)
Potassium: 4 mEq/L (ref 3.5–5.1)
SODIUM: 138 meq/L (ref 135–145)

## 2017-01-21 LAB — HEPATIC FUNCTION PANEL
ALK PHOS: 43 U/L (ref 39–117)
ALT: 13 U/L (ref 0–35)
AST: 17 U/L (ref 0–37)
Albumin: 4.2 g/dL (ref 3.5–5.2)
BILIRUBIN DIRECT: 0.1 mg/dL (ref 0.0–0.3)
BILIRUBIN TOTAL: 1 mg/dL (ref 0.2–1.2)
TOTAL PROTEIN: 8.2 g/dL (ref 6.0–8.3)

## 2017-01-21 LAB — HEMOGLOBIN A1C: Hgb A1c MFr Bld: 5.7 % (ref 4.6–6.5)

## 2017-01-21 MED ORDER — ZOLPIDEM TARTRATE ER 6.25 MG PO TBCR: 6.2500 mg | EXTENDED_RELEASE_TABLET | Freq: Every evening | ORAL | 1 refills | Status: DC | PRN

## 2017-01-21 NOTE — Progress Notes (Signed)
Pre visit review using our clinic review tool, if applicable. No additional management support is needed unless otherwise documented below in the visit note. 

## 2017-01-21 NOTE — Progress Notes (Signed)
Patient ID: Lori [name], female   DOB: 10-23-1934, 83 y.o.   MRN: [id number]   Subjective:    Patient ID: Lori [name], female    DOB: 1934-02-17, 83 y.o.   MRN: [id number]  HPI  Patient here for a scheduled follow up.  She reports she is doing relatively well.  No chest pain.  No sob.  Has noticed some acid reflux.  States notices if lies down across her bed - on her stomach.  No abdominal pain or cramping.  Bowels stable.  She has had right shoulder pain and right shoulder dislocation.  Seeing ortho.  Going to physical therapy.  Better.  Still some limited rom.  Just saw GI.  Has f/u in 02/2017.  F/u ultrasound scheduled then.     Past Medical History:  Diagnosis Date  . Anemia    iron deficient  . Autoimmune hepatitis ([place]) 05/10/2016  . B12 deficiency   . Chronic kidney disease   . Diverticulosis   . Fibrocystic breast disease   . GERD (gastroesophageal reflux disease)   . Hypercholesterolemia   . Hypertension   . Hypothyroidism   . Ulcer disease    Past Surgical History:  Procedure Laterality Date  . ESOPHAGOGASTRODUODENOSCOPY (EGD) WITH PROPOFOL N/A 01/30/2016   Procedure: ESOPHAGOGASTRODUODENOSCOPY (EGD) WITH PROPOFOL;  Surgeon: Lollie Sails, MD;  Location: [facility] ENDOSCOPY;  Service: Endoscopy;  Laterality: N/A;  . HERNIA REPAIR    . lap hiatal hernia repair  07/08/08  . LEFT OOPHORECTOMY  1993   benign ovarian cyst  . TUBAL LIGATION  1981   Family History  Problem Relation Age of Onset  . Stroke Mother   . Breast cancer Neg Hx   . Colon cancer Neg Hx    Social History   Social History  . Marital status: Widowed    Spouse name: N/A  . Number of children: 0  . Years of education: N/A   Social History Main Topics  . Smoking status: Never Smoker  . Smokeless tobacco: Never Used  . Alcohol use No  . Drug use: No  . Sexual activity: No   Other Topics Concern  . None   Social History Narrative  . None    Outpatient Encounter  Prescriptions as of 01/21/2017  Medication Sig  . aspirin 81 MG tablet Take 81 mg by mouth daily.  Marland Kitchen atenolol (TENORMIN) 25 MG tablet TAKE ONE (1) TABLET BY MOUTH EVERY DAY  . benazepril (LOTENSIN) 20 MG tablet TAKE ONE (1) TABLET BY MOUTH EVERY DAY  . calcitonin, salmon, (MIACALCIN/FORTICAL) 200 UNIT/ACT nasal spray USE ONE (1) SPRAY INTO ONE NOSTRIL DAILY; ALTERNATE NOSTRILS  . Calcium Citrate (CITRACAL PO) Take 1,200 mg by mouth daily.  . chlorzoxazone (PARAFON) 500 MG tablet One tablet q hs prn  . Cholecalciferol (D3-1000 PO) Take by mouth daily.  . Cyanocobalamin (B-12) 1000 MCG/ML KIT Inject 1,000 Units as directed.  Marland Kitchen GLUCOSAMINE-CHONDROITIN PO Take 2 tablets by mouth daily.  Marland Kitchen levothyroxine (SYNTHROID, LEVOTHROID) 75 MCG tablet Take 75 mcg by mouth daily before breakfast.  . magnesium oxide (MAG-OX) 400 (241.3 Mg) MG tablet TAKE ONE (1) TABLET EACH DAY  . Mesalamine (DELZICOL) 400 MG CPDR Take 400 mg by mouth 3 (three) times daily.  . pantoprazole (PROTONIX) 40 MG tablet Take 40 mg by mouth daily.   . Probiotic Product (ALIGN) 4 MG CAPS Take 1 capsule by mouth daily.  Marland Kitchen triamcinolone (NASACORT) 55 MCG/ACT AERO nasal inhaler Place into the nose.  Marland Kitchen  Wheat Dextrin (BENEFIBER DRINK MIX) PACK Take by mouth.  . zolpidem (AMBIEN CR) 6.25 MG CR tablet Take 1 tablet (6.25 mg total) by mouth at bedtime as needed. for sleep  . [DISCONTINUED] zolpidem (AMBIEN CR) 6.25 MG CR tablet TAKE ONE TABLET BY MOUTH AT BEDTIME AS NEEDED FOR SLEEP   No facility-administered encounter medications on file as of 01/21/2017.     Review of Systems  Constitutional: Negative for appetite change and unexpected weight change.  HENT: Negative for congestion and sinus pressure.   Respiratory: Negative for cough, chest tightness and shortness of breath.   Cardiovascular: Negative for chest pain, palpitations and leg swelling.  Gastrointestinal: Negative for abdominal pain, diarrhea, nausea and vomiting.    Genitourinary: Negative for difficulty urinating and dysuria.  Musculoskeletal: Negative for back pain and myalgias.       Limited rom - right shoulder.    Skin: Negative for color change and rash.  Neurological: Negative for dizziness, light-headedness and headaches.  Psychiatric/Behavioral: Negative for agitation and dysphoric mood.       Objective:     Blood pressure rechecked by me:  120/72  Physical Exam  Constitutional: She appears well-developed and well-nourished. No distress.  HENT:  Nose: Nose normal.  Mouth/Throat: Oropharynx is clear and moist.  Neck: Neck supple. No thyromegaly present.  Cardiovascular: Normal rate and regular rhythm.   Pulmonary/Chest: Breath sounds normal. No respiratory distress. She has no wheezes.  Abdominal: Soft. Bowel sounds are normal. There is no tenderness.  Musculoskeletal: She exhibits no edema or tenderness.  Limited rom - right shoulder.    Lymphadenopathy:    She has no cervical adenopathy.  Skin: No rash noted. No erythema.  Psychiatric: She has a normal mood and affect. Her behavior is normal.    BP 118/82 (BP Location: Left Arm, Patient Position: Sitting, Cuff Size: Normal)   Pulse (!) 55   Temp 98.3 F (36.8 C) (Oral)   Resp 16   Wt 167 lb (75.8 kg)   SpO2 95%   BMI 32.61 kg/m  Wt Readings from Last 3 Encounters:  01/21/17 167 lb (75.8 kg)  12/10/16 168 lb 12.2 oz (76.5 kg)  11/17/16 160 lb (72.6 kg)     Lab Results  Component Value Date   WBC 4.4 12/03/2016   HGB 12.6 12/03/2016   HCT 37.2 12/03/2016   PLT 163 12/03/2016   GLUCOSE 111 (H) 01/21/2017   CHOL 151 09/10/2016   TRIG 81.0 09/10/2016   HDL 48.20 09/10/2016   LDLCALC 86 09/10/2016   ALT 13 01/21/2017   AST 17 01/21/2017   NA 138 01/21/2017   K 4.0 01/21/2017   CL 104 01/21/2017   CREATININE 0.97 01/21/2017   BUN 27 (H) 01/21/2017   CO2 27 01/21/2017   TSH 1.32 10/04/2016   INR 1.03 05/13/2015   HGBA1C 5.7 01/21/2017    Dg Shoulder  Right  Result Date: 11/17/2016 CLINICAL DATA:  Fall. Right shoulder pain and deformity. Limited range of motion. EXAM: RIGHT SHOULDER - 2+ VIEW COMPARISON:  None. FINDINGS: There is anterior right shoulder dislocation. Small avulsed bone fragments noted superior to the humeral head adjacent to the glenoid. AC joint is intact. IMPRESSION: Anterior right shoulder dislocation with small avulsed fragments. Electronically Signed   By: Rolm Baptise M.D.   On: 11/17/2016 10:35   Dg Shoulder Right Port  Result Date: 11/17/2016 CLINICAL DATA:  Post reduction of shoulder dislocation. EXAM: PORTABLE RIGHT SHOULDER COMPARISON:  Right shoulder radiographs-earlier same  day FINDINGS: Interval reduction of previously noted anterior inferior glenohumeral joint shoulder dislocation. Ossicles adjacent to the greater tuberosity likely represent the sequela of age-indeterminate avulsive injury, less likely the sequela of calcific tendinitis. No definitive Hill-Sachs or bony [place] deformity on this portable examination. Regional soft tissues appear normal. Limited visualization of the adjacent thorax is normal. IMPRESSION: 1. Interval reduction of anterior inferior glenohumeral joint shoulder dislocation. 2. Ossicles adjacent to the greater tuberosity likely represent the sequela of age-indeterminate avulsive injury, less likely, the sequela of calcific tendinitis. Electronically Signed   By: Sandi Mariscal M.D.   On: 11/17/2016 12:59       Assessment & Plan:   Problem List Items Addressed This Visit    Anemia    Was being followed by hematology.  Previously found to be b12 deficient, iron deficient and MGUS.  Follow.        Autoimmune hepatitis ([place])    Followed by GI.  Followed liver function tests.        Relevant Orders   Hepatic function panel (Completed)   B12 deficiency    Continue b12 injections.        Barrett's esophagus with esophagitis    EGD 01/30/16 as outlined.  Followed by GI.         Gastroparesis    Currently stable.        Hyperglycemia    Low carb diet and exercise.  Follow met b and a1c.        Relevant Orders   Hemoglobin A1c (Completed)   Hypertension - Primary    Blood pressure under good control.  Continue same medication regimen.  Follow pressures.  Follow metabolic panel.        Relevant Orders   Basic metabolic panel (Completed)   Hypothyroidism    On thyroid replacement.  Follow tsh.        MGUS (monoclonal gammopathy of unknown significance)    Has been followed by hematology.  Just evaluated.  Stable.  Recommended no further f/u with hematology.        Obesity (BMI 30-39.9)    Diet and exercise.        Osteopenia    Received reclast.  Schedule bone density.          Other Visit Diagnoses    Estrogen deficiency       Relevant Orders   DG Bone Density       Einar Pheasant, MD

## 2017-01-22 ENCOUNTER — Encounter: Payer: Self-pay | Admitting: Internal Medicine

## 2017-01-22 DIAGNOSIS — M25611 Stiffness of right shoulder, not elsewhere classified: Secondary | ICD-10-CM | POA: Diagnosis not present

## 2017-01-22 DIAGNOSIS — M25511 Pain in right shoulder: Secondary | ICD-10-CM | POA: Diagnosis not present

## 2017-01-22 DIAGNOSIS — S43014D Anterior dislocation of right humerus, subsequent encounter: Secondary | ICD-10-CM | POA: Diagnosis not present

## 2017-01-22 DIAGNOSIS — M6281 Muscle weakness (generalized): Secondary | ICD-10-CM | POA: Diagnosis not present

## 2017-01-24 DIAGNOSIS — M25511 Pain in right shoulder: Secondary | ICD-10-CM | POA: Diagnosis not present

## 2017-01-24 DIAGNOSIS — M25611 Stiffness of right shoulder, not elsewhere classified: Secondary | ICD-10-CM | POA: Diagnosis not present

## 2017-01-24 DIAGNOSIS — M6281 Muscle weakness (generalized): Secondary | ICD-10-CM | POA: Diagnosis not present

## 2017-01-24 DIAGNOSIS — S43014D Anterior dislocation of right humerus, subsequent encounter: Secondary | ICD-10-CM | POA: Diagnosis not present

## 2017-01-27 ENCOUNTER — Encounter: Payer: Self-pay | Admitting: Internal Medicine

## 2017-01-27 NOTE — Assessment & Plan Note (Signed)
On thyroid replacement.  Follow tsh.  

## 2017-01-27 NOTE — Assessment & Plan Note (Signed)
Has been followed by hematology.  Just evaluated.  Stable.  Recommended no further f/u with hematology.

## 2017-01-27 NOTE — Assessment & Plan Note (Signed)
Was being followed by hematology.  Previously found to be b12 deficient, iron deficient and MGUS.  Follow.

## 2017-01-27 NOTE — Assessment & Plan Note (Signed)
Blood pressure under good control.  Continue same medication regimen.  Follow pressures.  Follow metabolic panel.   

## 2017-01-27 NOTE — Assessment & Plan Note (Signed)
EGD 01/30/16 as outlined.  Followed by GI.  

## 2017-01-27 NOTE — Assessment & Plan Note (Signed)
Followed by GI.  Followed liver function tests.

## 2017-01-27 NOTE — Assessment & Plan Note (Signed)
Diet and exercise.   

## 2017-01-27 NOTE — Assessment & Plan Note (Signed)
Low carb diet and exercise.  Follow met b and a1c.   

## 2017-01-27 NOTE — Assessment & Plan Note (Signed)
Currently stable.

## 2017-01-27 NOTE — Assessment & Plan Note (Addendum)
Received reclast.  Schedule bone density.

## 2017-01-27 NOTE — Assessment & Plan Note (Signed)
Continue b12 injections.  

## 2017-01-29 DIAGNOSIS — M25511 Pain in right shoulder: Secondary | ICD-10-CM | POA: Diagnosis not present

## 2017-01-29 DIAGNOSIS — M6281 Muscle weakness (generalized): Secondary | ICD-10-CM | POA: Diagnosis not present

## 2017-01-29 DIAGNOSIS — M25611 Stiffness of right shoulder, not elsewhere classified: Secondary | ICD-10-CM | POA: Diagnosis not present

## 2017-01-29 DIAGNOSIS — S43014D Anterior dislocation of right humerus, subsequent encounter: Secondary | ICD-10-CM | POA: Diagnosis not present

## 2017-02-05 ENCOUNTER — Ambulatory Visit (INDEPENDENT_AMBULATORY_CARE_PROVIDER_SITE_OTHER): Payer: Medicare Other

## 2017-02-05 DIAGNOSIS — E538 Deficiency of other specified B group vitamins: Secondary | ICD-10-CM

## 2017-02-05 MED ORDER — CYANOCOBALAMIN 1000 MCG/ML IJ SOLN
1000.0000 ug | Freq: Once | INTRAMUSCULAR | Status: AC
  Administered 2017-02-05: 1000 ug via INTRAMUSCULAR

## 2017-02-05 NOTE — Progress Notes (Addendum)
Patient comes in for B 12 injection.  Injected left deltoid.  Patient tolerated injection well.   Reviewed.  Dr Scott 

## 2017-02-07 DIAGNOSIS — M25511 Pain in right shoulder: Secondary | ICD-10-CM | POA: Diagnosis not present

## 2017-02-07 DIAGNOSIS — M25611 Stiffness of right shoulder, not elsewhere classified: Secondary | ICD-10-CM | POA: Diagnosis not present

## 2017-02-07 DIAGNOSIS — M6281 Muscle weakness (generalized): Secondary | ICD-10-CM | POA: Diagnosis not present

## 2017-02-07 DIAGNOSIS — S43014D Anterior dislocation of right humerus, subsequent encounter: Secondary | ICD-10-CM | POA: Diagnosis not present

## 2017-02-13 ENCOUNTER — Encounter: Payer: Self-pay | Admitting: Internal Medicine

## 2017-02-13 DIAGNOSIS — M810 Age-related osteoporosis without current pathological fracture: Secondary | ICD-10-CM | POA: Diagnosis not present

## 2017-02-20 ENCOUNTER — Other Ambulatory Visit: Payer: Self-pay | Admitting: Internal Medicine

## 2017-02-20 NOTE — Telephone Encounter (Signed)
Refilled on 02/21/2016.

## 2017-02-25 ENCOUNTER — Other Ambulatory Visit: Payer: Self-pay | Admitting: Internal Medicine

## 2017-03-07 ENCOUNTER — Ambulatory Visit (INDEPENDENT_AMBULATORY_CARE_PROVIDER_SITE_OTHER): Payer: Medicare Other | Admitting: *Deleted

## 2017-03-07 DIAGNOSIS — E538 Deficiency of other specified B group vitamins: Secondary | ICD-10-CM | POA: Diagnosis not present

## 2017-03-07 MED ORDER — CYANOCOBALAMIN 1000 MCG/ML IJ SOLN
1000.0000 ug | Freq: Once | INTRAMUSCULAR | Status: AC
  Administered 2017-03-07: 1000 ug via INTRAMUSCULAR

## 2017-03-07 NOTE — Progress Notes (Addendum)
Patient presented for B 12 injection to right arm, patient showed no signs of discomfort during injection.  Reviewed.  Dr Nicki Reaper

## 2017-03-26 ENCOUNTER — Ambulatory Visit (INDEPENDENT_AMBULATORY_CARE_PROVIDER_SITE_OTHER): Payer: Medicare Other

## 2017-03-26 VITALS — BP 110/70 | HR 64 | Temp 97.8°F | Resp 12 | Ht 59.0 in | Wt 166.8 lb

## 2017-03-26 DIAGNOSIS — Z1239 Encounter for other screening for malignant neoplasm of breast: Secondary | ICD-10-CM

## 2017-03-26 DIAGNOSIS — Z0000 Encounter for general adult medical examination without abnormal findings: Secondary | ICD-10-CM | POA: Diagnosis not present

## 2017-03-26 DIAGNOSIS — Z1231 Encounter for screening mammogram for malignant neoplasm of breast: Secondary | ICD-10-CM | POA: Diagnosis not present

## 2017-03-26 NOTE — Progress Notes (Signed)
Subjective:   Lori [name] is a 83 y.o. female who presents for Medicare Annual (Subsequent) preventive examination.  Review of Systems:  No ROS.  Medicare Wellness Visit.  Cardiac Risk Factors include: advanced age (>46mn, >>73women);hypertension     Objective:     Vitals: BP 110/70 (BP Location: Left Arm, Patient Position: Sitting, Cuff Size: Normal)   Pulse 64   Temp 97.8 F (36.6 C) (Oral)   Resp 12   Ht '4\' 11"'  (1.499 m)   Wt 166 lb 12.8 oz (75.7 kg)   SpO2 98%   BMI 33.69 kg/m   Body mass index is 33.69 kg/m.   Tobacco History  Smoking Status  . Never Smoker  Smokeless Tobacco  . Never Used     Counseling given: Not Answered   Past Medical History:  Diagnosis Date  . Anemia    iron deficient  . Autoimmune hepatitis ([place] 05/10/2016  . B12 deficiency   . Chronic kidney disease   . Diverticulosis   . Fibrocystic breast disease   . GERD (gastroesophageal reflux disease)   . Hypercholesterolemia   . Hypertension   . Hypothyroidism   . Ulcer disease    Past Surgical History:  Procedure Laterality Date  . ESOPHAGOGASTRODUODENOSCOPY (EGD) WITH PROPOFOL N/A 01/30/2016   Procedure: ESOPHAGOGASTRODUODENOSCOPY (EGD) WITH PROPOFOL;  Surgeon: MLollie Sails MD;  [facility];  Service: Endoscopy;  Laterality: N/A;  . HERNIA REPAIR    . lap hiatal hernia repair  07/08/08  . LEFT OOPHORECTOMY  1993   benign ovarian cyst  . TUBAL LIGATION  1981   Family History  Problem Relation Age of Onset  . Stroke Mother   . Breast cancer Neg Hx   . Colon cancer Neg Hx    History  Sexual Activity  . Sexual activity: No    Outpatient Encounter Prescriptions as of 03/26/2017  Medication Sig  . aspirin 81 MG tablet Take 81 mg by mouth daily.  .Marland Kitchenatenolol (TENORMIN) 25 MG tablet TAKE ONE (1) TABLET BY MOUTH EVERY DAY  . benazepril (LOTENSIN) 20 MG tablet TAKE ONE (1) TABLET BY MOUTH EVERY DAY  . calcitonin, salmon, (MIACALCIN/FORTICAL) 200  UNIT/ACT nasal spray USE 1 SPRAY INTO ONE NOSTRIL DAILY ALTERNATE NOSTRILS  . Calcium Citrate (CITRACAL PO) Take 1,200 mg by mouth daily.  . chlorzoxazone (PARAFON) 500 MG tablet One tablet q hs prn  . Cholecalciferol (D3-1000 PO) Take by mouth daily.  . Cyanocobalamin (B-12) 1000 MCG/ML KIT Inject 1,000 Units as directed.  .Marland KitchenGLUCOSAMINE-CHONDROITIN PO Take 2 tablets by mouth daily.  .Marland Kitchenlevothyroxine (SYNTHROID, LEVOTHROID) 75 MCG tablet Take 75 mcg by mouth daily before breakfast.  . magnesium oxide (MAG-OX) 400 (241.3 Mg) MG tablet TAKE ONE (1) TABLET EACH DAY  . Mesalamine (DELZICOL) 400 MG CPDR Take 400 mg by mouth 3 (three) times daily.  . pantoprazole (PROTONIX) 40 MG tablet Take 40 mg by mouth daily.   . Probiotic Product (ALIGN) 4 MG CAPS Take 1 capsule by mouth daily.  . Wheat Dextrin (BENEFIBER DRINK MIX) PACK Take by mouth.  . zolpidem (AMBIEN CR) 6.25 MG CR tablet Take 1 tablet (6.25 mg total) by mouth at bedtime as needed. for sleep  . [DISCONTINUED] triamcinolone (NASACORT) 55 MCG/ACT AERO nasal inhaler Place into the nose.   No facility-administered encounter medications on file as of 03/26/2017.     Activities of Daily Living In your present state of health, do you have any difficulty performing the  following activities: 03/26/2017 03/26/2016  Hearing? Tempie Donning  Vision? N N  Difficulty concentrating or making decisions? N N  Walking or climbing stairs? Y N  Dressing or bathing? N N  Doing errands, shopping? N N  Preparing Food and eating ? N N  Using the Toilet? N N  In the past six months, have you accidently leaked urine? N N  Do you have problems with loss of bowel control? N Y  Managing your Medications? N N  Managing your Finances? N N  Housekeeping or managing your Housekeeping? N N  Some recent data might be hidden    Patient Care Team: Einar Pheasant, MD as PCP - General (Internal Medicine)    Assessment:    This is a routine wellness examination for [place].  The goal of the wellness visit is to assist the patient how to close the gaps in care and create a preventative care plan for the patient.   Taking calcium VIT D3 as appropriate/Osteoporosis risk reviewed.  Medications reviewed; taking without issues or barriers.  Safety issues reviewed; smoke detectors in the home. No firearms in the home. Wears seatbelts when driving or riding with others. Patient does wear sunscreen or protective clothing when in direct sunlight. No violence in the home.  Patient is alert, normal appearance, oriented to person/place/and time. Correctly identified the president of the [place], recall of 3/3 words, and performing simple calculations.  Patient displays appropriate judgement and can read correct time from watch face.  No new identified risk were noted.  No failures at ADL's or IADL's.   BMI- discussed the importance of a healthy diet, water intake and exercise. Educational material provided.   HTN- followed by PCP.  Dental- every four months.  Eye- Visual acuity not assessed per patient preference since they have regular follow up with the ophthalmologist.  Wears corrective lenses.  Sleep patterns- Sleeps 7-8 hours at night.  Wakes feeling rested.  Mammogram ordered; follow as directed.  Educational material provided.  TDAP vaccine deferred per patient preference.  Follow up with insurance.  Educational material provided.  Patient Concerns: None at this time. Follow up with PCP as needed.  Exercise Activities and Dietary recommendations Current Exercise Habits: Home exercise routine (physical therapy exercise), Type of exercise: walking, Time (Minutes): 20, Frequency (Times/Week): 4, Weekly Exercise (Minutes/Week): 80, Intensity: Mild  Goals    . Healthy Lifestyle          Increase water intake, stay hydrated. Low carb foods.  Lean meats, fruits and vegetables. Continue walking with [place] as often as possible. Stay active.      Fall  Risk Fall Risk  03/26/2017 09/10/2016 03/26/2016 09/07/2015 06/02/2015  Falls in the past year? Yes No No No No  Number falls in past yr: 1 - - - -  Injury with Fall? Yes - - - -  Follow up Falls prevention discussed - - - -   Depression Screen PHQ 2/9 Scores 03/26/2017 09/10/2016 03/26/2016 09/07/2015  PHQ - 2 Score 0 0 0 0     Cognitive Function MMSE - Mini Mental State Exam 03/26/2017 03/26/2016  Orientation to time 5 5  Orientation to Place 5 5  Registration 3 3  Attention/ Calculation 5 5  Recall 3 3  Language- name 2 objects 2 2  Language- repeat 1 1  Language- follow 3 step command 3 3  Language- read & follow direction 1 1  Write a sentence 1 1  Copy design 1 1  Total score 30 30        Immunization History  Administered Date(s) Administered  . Influenza,inj,Quad PF,36+ Mos 08/31/2014, 09/07/2015, 08/21/2016  . Pneumococcal Conjugate-13 03/08/2014  . Pneumococcal Polysaccharide-23 07/22/2008  . Zoster 12/31/2008   Screening Tests Health Maintenance  Topic Date Due  . TETANUS/TDAP  10/01/1953  . MAMMOGRAM  04/03/2017  . INFLUENZA VACCINE  Addressed  . DEXA SCAN  Completed  . PNA vac Low Risk Adult  Completed      Plan:    End of life planning; Advance aging; Advanced directives discussed. Copy of current HCPOA/Living Will requested.    Medicare Attestation I have personally reviewed: The patient's medical and social history Their use of alcohol, tobacco or illicit drugs Their current medications and supplements The patient's functional ability including ADLs,fall risks, home safety risks, cognitive, and hearing and visual impairment Diet and physical activities Evidence for depression   The patient's weight, height, BMI, and visual acuity have been recorded in the chart.  I have made referrals and provided education to the patient based on review of the above and I have provided the patient with a written personalized care plan for preventive services.     During the course of the visit the patient was educated and counseled about the following appropriate screening and preventive services:   Vaccines to include Pneumoccal, Influenza, Hepatitis B, Td, Zostavax, HCV  Colorectal cancer screening-UTD  Bone density screening-UTD  Glaucoma screening-annual eye exam  Mammography-ordered, follow as directed  Nutrition counseling   Patient Instructions (the written plan) was given to the patient.   Varney Biles, LPN  2/90/2111   Reviewed above information.  Agree with plan.  Dr Nicki Reaper

## 2017-03-26 NOTE — Patient Instructions (Addendum)
Lori [name] , Thank you for taking time to come for your Medicare Wellness Visit. I appreciate your ongoing commitment to your health goals. Please review the following plan we discussed and let me know if I can assist you in the future.   Follow up with Dr. Nicki Reaper as needed.    Bring a copy of your [place] and/or Living Will to be scanned into chart.  Have a great day!  These are the goals we discussed: Goals    . Healthy Lifestyle          Increase water intake, stay hydrated. Low carb foods.  Lean meats, fruits and vegetables. Continue walking with [place] as often as possible. Stay active.       This is a list of the screening recommended for you and due dates:  Health Maintenance  Topic Date Due  . Tetanus Vaccine  10/01/1953  . Mammogram  04/03/2017  . Flu Shot  Addressed  . DEXA scan (bone density measurement)  Completed  . Pneumonia vaccines  Completed      Fall Prevention in the Home Falls can cause injuries. They can happen to people of all ages. There are many things you can do to make your home safe and to help prevent falls. What can I do on the outside of my home?  Regularly fix the edges of walkways and driveways and fix any cracks.  Remove anything that might make you trip as you walk through a door, such as a raised step or threshold.  Trim any bushes or trees on the path to your home.  Use bright outdoor lighting.  Clear any walking paths of anything that might make someone trip, such as rocks or tools.  Regularly check to see if handrails are loose or broken. Make sure that both sides of any steps have handrails.  Any raised decks and porches should have guardrails on the edges.  Have any leaves, snow, or ice cleared regularly.  Use sand or salt on walking paths during winter.  Clean up any spills in your garage right away. This includes oil or grease spills. What can I do in the bathroom?  Use night lights.  Install  grab bars by the toilet and in the tub and shower. Do not use towel bars as grab bars.  Use non-skid mats or decals in the tub or shower.  If you need to sit down in the shower, use a plastic, non-slip stool.  Keep the floor dry. Clean up any water that spills on the floor as soon as it happens.  Remove soap buildup in the tub or shower regularly.  Attach bath mats securely with double-sided non-slip rug tape.  Do not have throw rugs and other things on the floor that can make you trip. What can I do in the bedroom?  Use night lights.  Make sure that you have a light by your bed that is easy to reach.  Do not use any sheets or blankets that are too big for your bed. They should not hang down onto the floor.  Have a firm chair that has side arms. You can use this for support while you get dressed.  Do not have throw rugs and other things on the floor that can make you trip. What can I do in the kitchen?  Clean up any spills right away.  Avoid walking on wet floors.  Keep items that you use a lot in easy-to-reach places.  If you need to reach something above you, use a strong step stool that has a grab bar.  Keep electrical cords out of the way.  Do not use floor polish or wax that makes floors slippery. If you must use wax, use non-skid floor wax.  Do not have throw rugs and other things on the floor that can make you trip. What can I do with my stairs?  Do not leave any items on the stairs.  Make sure that there are handrails on both sides of the stairs and use them. Fix handrails that are broken or loose. Make sure that handrails are as long as the stairways.  Check any carpeting to make sure that it is firmly attached to the stairs. Fix any carpet that is loose or worn.  Avoid having throw rugs at the top or bottom of the stairs. If you do have throw rugs, attach them to the floor with carpet tape.  Make sure that you have a light switch at the top of the stairs and  the bottom of the stairs. If you do not have them, ask someone to add them for you. What else can I do to help prevent falls?  Wear shoes that:  Do not have high heels.  Have rubber bottoms.  Are comfortable and fit you well.  Are closed at the toe. Do not wear sandals.  If you use a stepladder:  Make sure that it is fully opened. Do not climb a closed stepladder.  Make sure that both sides of the stepladder are locked into place.  Ask someone to hold it for you, if possible.  Clearly mark and make sure that you can see:  Any grab bars or handrails.  First and last steps.  Where the edge of each step is.  Use tools that help you move around (mobility aids) if they are needed. These include:  Canes.  Walkers.  Scooters.  Crutches.  Turn on the lights when you go into a dark area. Replace any light bulbs as soon as they burn out.  Set up your furniture so you have a clear path. Avoid moving your furniture around.  If any of your floors are uneven, fix them.  If there are any pets around you, be aware of where they are.  Review your medicines with your doctor. Some medicines can make you feel dizzy. This can increase your chance of falling. Ask your doctor what other things that you can do to help prevent falls. This information is not intended to replace advice given to you by your health care provider. Make sure you discuss any questions you have with your health care provider. Document Released: 10/13/2009 Document Revised: 05/24/2016 Document Reviewed: 01/21/2015 Elsevier Interactive Patient Education  2017 [place] A mammogram is an X-ray of the breasts that is done to check for abnormal changes. This procedure can screen for and detect any changes that may suggest breast cancer. A mammogram can also identify other changes and variations in the breast, such as:  Inflammation of the breast tissue (mastitis).  An infected area that contains  a collection of pus (abscess).  A fluid-filled sac (cyst).  Fibrocystic changes. This is when breast tissue becomes denser, which can make the tissue feel rope-like or uneven under the skin.  Tumors that are not cancerous (benign). Tell a health care provider about:  Any allergies you have.  If you have breast implants.  If you have had previous  breast disease, biopsy, or surgery.  If you are breastfeeding.  Any possibility that you could be pregnant, if this applies.  If you are younger than age 49.  If you have a family history of breast cancer. What are the risks? Generally, this is a safe procedure. However, problems may occur, including:  Exposure to radiation. Radiation levels are very low with this test.  The results being misinterpreted.  The need for further tests.  The inability of the mammogram to detect certain cancers. What happens before the procedure?  Schedule your test about 1-2 weeks after your menstrual period. This is usually when your breasts are the least tender.  If you have had a mammogram done at a different facility in the past, get the mammogram X-rays or have them sent to your current exam facility in order to compare them.  Wash your breasts and under your arms the day of the test.  Do not wear deodorants, perfumes, lotions, or powders anywhere on your body on the day of the test.  Remove any jewelry from your neck.  Wear clothes that you can change into and out of easily. What happens during the procedure?  You will undress from the waist up and put on a gown.  You will stand in front of the X-ray machine.  Each breast will be placed between two plastic or glass plates. The plates will compress your breast for a few seconds. Try to stay as relaxed as possible during the procedure. This does not cause any harm to your breasts and any discomfort you feel will be very brief.  X-rays will be taken from different angles of each  breast. The procedure may vary among health care providers and hospitals. What happens after the procedure?  The mammogram will be examined by a specialist (radiologist).  You may need to repeat certain parts of the test, depending on the quality of the images. This is commonly done if the radiologist needs a better view of the breast tissue.  Ask when your test results will be ready. Make sure you get your test results.  You may resume your normal activities. This information is not intended to replace advice given to you by your health care provider. Make sure you discuss any questions you have with your health care provider. Document Released: 12/14/2000 Document Revised: 05/21/2016 Document Reviewed: 02/25/2015 Elsevier Interactive Patient Education  2017 [employer].

## 2017-03-27 DIAGNOSIS — K754 Autoimmune hepatitis: Secondary | ICD-10-CM | POA: Diagnosis not present

## 2017-03-28 ENCOUNTER — Ambulatory Visit
Admission: RE | Admit: 2017-03-28 | Discharge: 2017-03-28 | Disposition: A | Discharge status: Home / Self Care | Payer: Medicare Other | Source: Ambulatory Visit | Attending: Gastroenterology | Admitting: Gastroenterology

## 2017-03-28 DIAGNOSIS — K760 Fatty (change of) liver, not elsewhere classified: Secondary | ICD-10-CM | POA: Insufficient documentation

## 2017-03-28 DIAGNOSIS — K754 Autoimmune hepatitis: Secondary | ICD-10-CM | POA: Diagnosis not present

## 2017-04-09 ENCOUNTER — Ambulatory Visit (INDEPENDENT_AMBULATORY_CARE_PROVIDER_SITE_OTHER): Payer: Medicare Other | Admitting: *Deleted

## 2017-04-09 DIAGNOSIS — E538 Deficiency of other specified B group vitamins: Secondary | ICD-10-CM

## 2017-04-09 MED ORDER — CYANOCOBALAMIN 1000 MCG/ML IJ SOLN
1000.0000 ug | Freq: Once | INTRAMUSCULAR | Status: AC
  Administered 2017-04-09: 1000 ug via INTRAMUSCULAR

## 2017-04-09 NOTE — Progress Notes (Addendum)
Patient presented for B 12 injection to left deltoid ,patient voiced no concerns or showed any signs of distress during injection.  Reviewed.  Dr Nicki Reaper

## 2017-04-23 ENCOUNTER — Other Ambulatory Visit: Payer: Self-pay | Admitting: Internal Medicine

## 2017-04-24 ENCOUNTER — Other Ambulatory Visit: Payer: Self-pay | Admitting: Internal Medicine

## 2017-05-14 ENCOUNTER — Ambulatory Visit (INDEPENDENT_AMBULATORY_CARE_PROVIDER_SITE_OTHER): Payer: Medicare Other | Admitting: *Deleted

## 2017-05-14 DIAGNOSIS — E538 Deficiency of other specified B group vitamins: Secondary | ICD-10-CM | POA: Diagnosis not present

## 2017-05-14 MED ORDER — CYANOCOBALAMIN 1000 MCG/ML IJ SOLN
1000.0000 ug | Freq: Once | INTRAMUSCULAR | Status: AC
  Administered 2017-05-14: 1000 ug via INTRAMUSCULAR

## 2017-05-14 NOTE — Progress Notes (Addendum)
Patient presented for B 12 injection to right deltoid, Patient voiced no concern during injection or showed any sign of distress.   Reviewed.  Dr Scott 

## 2017-05-16 ENCOUNTER — Ambulatory Visit
Admission: RE | Admit: 2017-05-16 | Discharge: 2017-05-16 | Disposition: A | Discharge status: Home / Self Care | Payer: Medicare Other | Source: Ambulatory Visit | Attending: Internal Medicine | Admitting: Internal Medicine

## 2017-05-16 ENCOUNTER — Other Ambulatory Visit: Payer: Self-pay | Admitting: Internal Medicine

## 2017-05-16 DIAGNOSIS — Z1239 Encounter for other screening for malignant neoplasm of breast: Secondary | ICD-10-CM

## 2017-05-16 DIAGNOSIS — Z1231 Encounter for screening mammogram for malignant neoplasm of breast: Secondary | ICD-10-CM | POA: Insufficient documentation

## 2017-05-21 ENCOUNTER — Ambulatory Visit (INDEPENDENT_AMBULATORY_CARE_PROVIDER_SITE_OTHER): Payer: Medicare Other | Admitting: Internal Medicine

## 2017-05-21 ENCOUNTER — Encounter: Payer: Self-pay | Admitting: Internal Medicine

## 2017-05-21 VITALS — BP 110/68 | HR 50 | Temp 98.3°F | Resp 12 | Ht 59.0 in | Wt 164.2 lb

## 2017-05-21 DIAGNOSIS — K2270 Barrett's esophagus without dysplasia: Secondary | ICD-10-CM | POA: Diagnosis not present

## 2017-05-21 DIAGNOSIS — R252 Cramp and spasm: Secondary | ICD-10-CM | POA: Diagnosis not present

## 2017-05-21 DIAGNOSIS — R945 Abnormal results of liver function studies: Secondary | ICD-10-CM | POA: Diagnosis not present

## 2017-05-21 DIAGNOSIS — K754 Autoimmune hepatitis: Secondary | ICD-10-CM

## 2017-05-21 DIAGNOSIS — E538 Deficiency of other specified B group vitamins: Secondary | ICD-10-CM | POA: Diagnosis not present

## 2017-05-21 DIAGNOSIS — E669 Obesity, unspecified: Secondary | ICD-10-CM

## 2017-05-21 DIAGNOSIS — R739 Hyperglycemia, unspecified: Secondary | ICD-10-CM | POA: Diagnosis not present

## 2017-05-21 DIAGNOSIS — E039 Hypothyroidism, unspecified: Secondary | ICD-10-CM | POA: Diagnosis not present

## 2017-05-21 DIAGNOSIS — D649 Anemia, unspecified: Secondary | ICD-10-CM

## 2017-05-21 DIAGNOSIS — R7989 Other specified abnormal findings of blood chemistry: Secondary | ICD-10-CM

## 2017-05-21 DIAGNOSIS — I10 Essential (primary) hypertension: Secondary | ICD-10-CM

## 2017-05-21 DIAGNOSIS — D472 Monoclonal gammopathy: Secondary | ICD-10-CM | POA: Diagnosis not present

## 2017-05-21 DIAGNOSIS — K209 Esophagitis, unspecified: Secondary | ICD-10-CM | POA: Diagnosis not present

## 2017-05-21 LAB — CBC WITH DIFFERENTIAL/PLATELET
BASOS ABS: 0.1 10*3/uL (ref 0.0–0.1)
BASOS PCT: 1.2 % (ref 0.0–3.0)
EOS ABS: 0.1 10*3/uL (ref 0.0–0.7)
Eosinophils Relative: 1.2 % (ref 0.0–5.0)
HEMATOCRIT: 39.2 % (ref 36.0–46.0)
Hemoglobin: 13.1 g/dL (ref 12.0–15.0)
LYMPHS PCT: 13.7 % (ref 12.0–46.0)
Lymphs Abs: 0.7 10*3/uL (ref 0.7–4.0)
MCHC: 33.4 g/dL (ref 30.0–36.0)
MCV: 88.6 fl (ref 78.0–100.0)
Monocytes Absolute: 0.4 10*3/uL (ref 0.1–1.0)
Monocytes Relative: 7 % (ref 3.0–12.0)
NEUTROS ABS: 4 10*3/uL (ref 1.4–7.7)
Neutrophils Relative %: 76.9 % (ref 43.0–77.0)
Platelets: 213 10*3/uL (ref 150.0–400.0)
RBC: 4.43 Mil/uL (ref 3.87–5.11)
RDW: 13.5 % (ref 11.5–15.5)
WBC: 5.2 10*3/uL (ref 4.0–10.5)

## 2017-05-21 LAB — HEPATIC FUNCTION PANEL
ALK PHOS: 47 U/L (ref 39–117)
ALT: 11 U/L (ref 0–35)
AST: 17 U/L (ref 0–37)
Albumin: 4.3 g/dL (ref 3.5–5.2)
BILIRUBIN DIRECT: 0.3 mg/dL (ref 0.0–0.3)
BILIRUBIN TOTAL: 1.3 mg/dL — AB (ref 0.2–1.2)
Total Protein: 8 g/dL (ref 6.0–8.3)

## 2017-05-21 LAB — BASIC METABOLIC PANEL
BUN: 25 mg/dL — AB (ref 6–23)
CHLORIDE: 104 meq/L (ref 96–112)
CO2: 28 meq/L (ref 19–32)
CREATININE: 0.98 mg/dL (ref 0.40–1.20)
Calcium: 9.9 mg/dL (ref 8.4–10.5)
GFR: 57.94 mL/min — ABNORMAL LOW (ref >60.00)
Glucose, Bld: 104 mg/dL — ABNORMAL HIGH (ref 70–99)
Potassium: 4.5 mEq/L (ref 3.5–5.1)
Sodium: 139 mEq/L (ref 135–145)

## 2017-05-21 LAB — HEMOGLOBIN A1C: Hgb A1c MFr Bld: 5.8 % (ref 4.6–6.5)

## 2017-05-21 LAB — TSH: TSH: 1.45 u[IU]/mL (ref 0.35–4.50)

## 2017-05-21 MED ORDER — CALCITONIN (SALMON) 200 UNIT/ACT NA SOLN: NASAL | 5 refills | Status: DC

## 2017-05-21 MED ORDER — CHLORZOXAZONE 500 MG PO TABS: ORAL_TABLET | ORAL | 0 refills | Status: DC

## 2017-05-21 MED ORDER — LEVOTHYROXINE SODIUM 75 MCG PO TABS: 75.0000 ug | ORAL_TABLET | Freq: Every day | ORAL | 6 refills | Status: DC

## 2017-05-21 NOTE — Progress Notes (Signed)
Patient ID: Lori [name], female   DOB: 1934-06-14, 83 y.o.   MRN: [id number]   Subjective:    Patient ID: Lori [name], female    DOB: 1934/10/18, 83 y.o.   MRN: [id number]  HPI  Patient here for a scheduled follow up.  She reports she is doing well.  Trying to stay active.  No chest pain.  No sob.  No acid reflux.  No abdominal pain.  Bowels overall stable.  Uses ambien to help her sleep.     Past Medical History:  Diagnosis Date  . Anemia    iron deficient  . Autoimmune hepatitis ([place]) 05/10/2016  . B12 deficiency   . Chronic kidney disease   . Diverticulosis   . Fibrocystic breast disease   . GERD (gastroesophageal reflux disease)   . Hypercholesterolemia   . Hypertension   . Hypothyroidism   . Ulcer disease    Past Surgical History:  Procedure Laterality Date  . ESOPHAGOGASTRODUODENOSCOPY (EGD) WITH PROPOFOL N/A 01/30/2016   Procedure: ESOPHAGOGASTRODUODENOSCOPY (EGD) WITH PROPOFOL;  Surgeon: Lollie Sails, MD;  Location: [facility] ENDOSCOPY;  Service: Endoscopy;  Laterality: N/A;  . HERNIA REPAIR    . lap hiatal hernia repair  07/08/08  . LEFT OOPHORECTOMY  1993   benign ovarian cyst  . TUBAL LIGATION  1981   Family History  Problem Relation Age of Onset  . Stroke Mother   . Breast cancer Neg Hx   . Colon cancer Neg Hx    Social History   Social History  . Marital status: Widowed    Spouse name: N/A  . Number of children: 0  . Years of education: N/A   Social History Main Topics  . Smoking status: Never Smoker  . Smokeless tobacco: Never Used  . Alcohol use No  . Drug use: No  . Sexual activity: No   Other Topics Concern  . None   Social History Narrative  . None    Outpatient Encounter Prescriptions as of 05/21/2017  Medication Sig  . aspirin 81 MG tablet Take 81 mg by mouth daily.  Marland Kitchen atenolol (TENORMIN) 25 MG tablet TAKE ONE (1) TABLET BY MOUTH EVERY DAY  . benazepril (LOTENSIN) 20 MG tablet TAKE ONE (1) TABLET BY MOUTH EVERY DAY    . calcitonin, salmon, (MIACALCIN/FORTICAL) 200 UNIT/ACT nasal spray USE 1 SPRAY INTO ONE NOSTRIL DAILY ALTERNATE NOSTRILS  . Calcium Citrate (CITRACAL PO) Take 1,200 mg by mouth daily.  . chlorzoxazone (PARAFON) 500 MG tablet One tablet q hs prn  . Cholecalciferol (D3-1000 PO) Take by mouth daily.  . Cyanocobalamin (B-12) 1000 MCG/ML KIT Inject 1,000 Units as directed.  Marland Kitchen GLUCOSAMINE-CHONDROITIN PO Take 2 tablets by mouth daily.  Marland Kitchen levothyroxine (SYNTHROID, LEVOTHROID) 75 MCG tablet Take 1 tablet (75 mcg total) by mouth daily before breakfast.  . magnesium oxide (MAG-OX) 400 (241.3 Mg) MG tablet TAKE ONE (1) TABLET EACH DAY  . Mesalamine (DELZICOL) 400 MG CPDR Take 400 mg by mouth 3 (three) times daily.  . pantoprazole (PROTONIX) 40 MG tablet Take 40 mg by mouth daily.   . Probiotic Product (ALIGN) 4 MG CAPS Take 1 capsule by mouth daily.  . Wheat Dextrin (BENEFIBER DRINK MIX) PACK Take by mouth.  . zolpidem (AMBIEN CR) 6.25 MG CR tablet TAKE ONE TABLET BY MOUTH AT BEDTIME AS NEEDED FOR SLEEP  . [DISCONTINUED] calcitonin, salmon, (MIACALCIN/FORTICAL) 200 UNIT/ACT nasal spray USE 1 SPRAY INTO ONE NOSTRIL DAILY ALTERNATE NOSTRILS  . [DISCONTINUED] chlorzoxazone (PARAFON) 500  MG tablet One tablet q hs prn  . [DISCONTINUED] levothyroxine (SYNTHROID, LEVOTHROID) 75 MCG tablet Take 75 mcg by mouth daily before breakfast.   No facility-administered encounter medications on file as of 05/21/2017.     Review of Systems  Constitutional: Negative for appetite change and unexpected weight change.  HENT: Negative for congestion and sinus pressure.   Respiratory: Negative for cough, chest tightness and shortness of breath.   Cardiovascular: Negative for chest pain, palpitations and leg swelling.  Gastrointestinal: Negative for abdominal pain, diarrhea, nausea and vomiting.  Genitourinary: Negative for difficulty urinating and dysuria.  Musculoskeletal: Negative for back pain and joint swelling.  Skin:  Negative for color change and rash.  Neurological: Negative for dizziness, light-headedness and headaches.  Psychiatric/Behavioral: Negative for agitation and dysphoric mood.       Objective:    Physical Exam  Constitutional: She appears well-developed and well-nourished. No distress.  HENT:  Nose: Nose normal.  Mouth/Throat: Oropharynx is clear and moist.  Neck: Neck supple. No thyromegaly present.  Cardiovascular: Normal rate and regular rhythm.   Pulmonary/Chest: Breath sounds normal. No respiratory distress. She has no wheezes.  Abdominal: Soft. Bowel sounds are normal. There is no tenderness.  Musculoskeletal: She exhibits no edema or tenderness.  Lymphadenopathy:    She has no cervical adenopathy.  Skin: No rash noted. No erythema.  Psychiatric: She has a normal mood and affect. Her behavior is normal.    BP 110/68 (BP Location: Left Arm, Patient Position: Sitting, Cuff Size: Large)   Pulse (!) 50   Temp 98.3 F (36.8 C) (Oral)   Resp 12   Ht _0  (1.499 m)   Wt 164 lb 3.2 oz (74.5 kg)   SpO2 97%   BMI 33.16 kg/m  Wt Readings from Last 3 Encounters:  05/21/17 164 lb 3.2 oz (74.5 kg)  03/26/17 166 lb 12.8 oz (75.7 kg)  01/21/17 167 lb (75.8 kg)     Lab Results  Component Value Date   WBC 5.2 05/21/2017   HGB 13.1 05/21/2017   HCT 39.2 05/21/2017   PLT 213.0 05/21/2017   GLUCOSE 104 (H) 05/21/2017   CHOL 151 09/10/2016   TRIG 81.0 09/10/2016   HDL 48.20 09/10/2016   LDLCALC 86 09/10/2016   ALT 11 05/21/2017   AST 17 05/21/2017   NA 139 05/21/2017   K 4.5 05/21/2017   CL 104 05/21/2017   CREATININE 0.98 05/21/2017   BUN 25 (H) 05/21/2017   CO2 28 05/21/2017   TSH 1.45 05/21/2017   INR 1.03 05/13/2015   HGBA1C 5.8 05/21/2017    Mm Screening Breast Tomo Bilateral  Result Date: 05/16/2017 CLINICAL DATA:  Screening. EXAM: 2D DIGITAL SCREENING BILATERAL MAMMOGRAM WITH CAD AND ADJUNCT TOMO COMPARISON:  Previous exam(s). ACR Breast Density Category c:  The breast tissue is heterogeneously dense, which may obscure small masses. FINDINGS: There are no findings suspicious for malignancy. Images were processed with CAD. IMPRESSION: No mammographic evidence of malignancy. A result letter of this screening mammogram will be mailed directly to the patient. RECOMMENDATION: Screening mammogram in one year. (Code:SM-B-01Y) BI-RADS CATEGORY  1: Negative. Electronically Signed   By: Curlene Dolphin M.D.   On: 05/16/2017 16:47       Assessment & Plan:   Problem List Items Addressed This Visit    Abnormal liver function tests - Primary    Worked up by GI.  See notes.  Follow liver panel.        Relevant Orders  Hepatic function panel (Completed)   Anemia    Was being followed by hematology.  Previously found to be B12 deficient and iron deficient and MGUS.  Follow.        Relevant Orders   CBC with Differential/Platelet (Completed)   Autoimmune hepatitis ([place])    Followed by GI.  Follow liver function tests.        B12 deficiency    Continue b12 injections.        Barrett's esophagus with esophagitis    EGD 01/30/16 as outlined.  Followed by GI.        Hyperglycemia    Low carb diet and exercise.  Follow met b and a1c.        Relevant Orders   Hemoglobin A1c (Completed)   Hypertension    Blood pressure under good control.  Continue same medication regimen.  Follow pressures.  Follow metabolic panel.        Relevant Orders   Basic metabolic panel (Completed)   Hypothyroidism    On thyroid replacement.  Follow tsh.        Relevant Medications   levothyroxine (SYNTHROID, LEVOTHROID) 75 MCG tablet   Other Relevant Orders   TSH (Completed)   Leg cramps   Relevant Medications   chlorzoxazone (PARAFON) 500 MG tablet   MGUS (monoclonal gammopathy of unknown significance)    Has been followed by hematology.  Has been stable.        Obesity (BMI 30-39.9)    Diet and exercise.  Follow.           Einar Pheasant, MD

## 2017-05-21 NOTE — Progress Notes (Signed)
Pre-visit discussion using our clinic review tool. No additional management support is needed unless otherwise documented below in the visit note.  

## 2017-05-22 ENCOUNTER — Telehealth: Payer: Self-pay | Admitting: *Deleted

## 2017-05-22 NOTE — Telephone Encounter (Signed)
See lab message 

## 2017-05-22 NOTE — Telephone Encounter (Signed)
Patient has requested lab results  Pt [phone]

## 2017-05-25 ENCOUNTER — Encounter: Payer: Self-pay | Admitting: Internal Medicine

## 2017-05-25 NOTE — Assessment & Plan Note (Signed)
Low carb diet and exercise.  Follow met b and a1c.   

## 2017-05-25 NOTE — Assessment & Plan Note (Signed)
Was being followed by hematology.  Previously found to be B12 deficient and iron deficient and MGUS.  Follow.

## 2017-05-25 NOTE — Assessment & Plan Note (Signed)
Continue b12 injections.  

## 2017-05-25 NOTE — Assessment & Plan Note (Signed)
On thyroid replacement.  Follow tsh.  

## 2017-05-25 NOTE — Assessment & Plan Note (Signed)
Has been followed by hematology.  Has been stable.

## 2017-05-25 NOTE — Assessment & Plan Note (Signed)
Worked up by GI.  See notes.  Follow liver panel.   

## 2017-05-25 NOTE — Assessment & Plan Note (Signed)
Diet and exercise.  Follow.  

## 2017-05-25 NOTE — Assessment & Plan Note (Signed)
EGD 01/30/16 as outlined.  Followed by GI.  

## 2017-05-25 NOTE — Assessment & Plan Note (Signed)
Followed by GI.  Follow liver function tests.  

## 2017-05-25 NOTE — Assessment & Plan Note (Signed)
Blood pressure under good control.  Continue same medication regimen.  Follow pressures.  Follow metabolic panel.   

## 2017-06-04 ENCOUNTER — Other Ambulatory Visit: Payer: Self-pay | Admitting: Internal Medicine

## 2017-06-11 ENCOUNTER — Telehealth: Payer: Self-pay | Admitting: *Deleted

## 2017-06-11 DIAGNOSIS — R17 Unspecified jaundice: Secondary | ICD-10-CM

## 2017-06-11 NOTE — Telephone Encounter (Signed)
Future lab order placed based on last lab note.

## 2017-06-12 ENCOUNTER — Other Ambulatory Visit (INDEPENDENT_AMBULATORY_CARE_PROVIDER_SITE_OTHER): Payer: Medicare Other

## 2017-06-12 DIAGNOSIS — R17 Unspecified jaundice: Secondary | ICD-10-CM | POA: Diagnosis not present

## 2017-06-12 LAB — HEPATIC FUNCTION PANEL
ALT: 11 U/L (ref 0–35)
AST: 17 U/L (ref 0–37)
Albumin: 4.1 g/dL (ref 3.5–5.2)
Alkaline Phosphatase: 45 U/L (ref 39–117)
BILIRUBIN DIRECT: 0.2 mg/dL (ref 0.0–0.3)
BILIRUBIN TOTAL: 1 mg/dL (ref 0.2–1.2)
TOTAL PROTEIN: 7.8 g/dL (ref 6.0–8.3)

## 2017-06-13 ENCOUNTER — Encounter: Payer: Self-pay | Admitting: Internal Medicine

## 2017-06-18 ENCOUNTER — Ambulatory Visit (INDEPENDENT_AMBULATORY_CARE_PROVIDER_SITE_OTHER): Payer: Medicare Other | Admitting: *Deleted

## 2017-06-18 DIAGNOSIS — E538 Deficiency of other specified B group vitamins: Secondary | ICD-10-CM | POA: Diagnosis not present

## 2017-06-18 MED ORDER — CYANOCOBALAMIN 1000 MCG/ML IJ SOLN
1000.0000 ug | Freq: Once | INTRAMUSCULAR | Status: AC
  Administered 2017-06-18: 1000 ug via INTRAMUSCULAR

## 2017-06-18 NOTE — Progress Notes (Addendum)
Patient presented for B 12 injection to left deltoid, patient voiced no concerns nor showed any signs of distress during injection  Agree with injection. Margaret Arnett, NP  

## 2017-06-27 ENCOUNTER — Other Ambulatory Visit: Payer: Self-pay | Admitting: Internal Medicine

## 2017-07-23 ENCOUNTER — Ambulatory Visit (INDEPENDENT_AMBULATORY_CARE_PROVIDER_SITE_OTHER): Payer: Medicare Other | Admitting: *Deleted

## 2017-07-23 DIAGNOSIS — E538 Deficiency of other specified B group vitamins: Secondary | ICD-10-CM | POA: Diagnosis not present

## 2017-07-23 MED ORDER — CYANOCOBALAMIN 1000 MCG/ML IJ SOLN
1000.0000 ug | Freq: Once | INTRAMUSCULAR | Status: AC
  Administered 2017-07-23: 1000 ug via INTRAMUSCULAR

## 2017-07-23 NOTE — Progress Notes (Addendum)
Patient presented for B 12 injection to right deltoid, patient voiced no concerns nor showed any signs of distress during injection.  Reviewed.  Dr Scott 

## 2017-07-31 DIAGNOSIS — K2270 Barrett's esophagus without dysplasia: Secondary | ICD-10-CM | POA: Diagnosis not present

## 2017-08-18 ENCOUNTER — Other Ambulatory Visit: Payer: Self-pay | Admitting: Internal Medicine

## 2017-08-27 ENCOUNTER — Ambulatory Visit (INDEPENDENT_AMBULATORY_CARE_PROVIDER_SITE_OTHER): Payer: Medicare Other

## 2017-08-27 DIAGNOSIS — E538 Deficiency of other specified B group vitamins: Secondary | ICD-10-CM | POA: Diagnosis not present

## 2017-08-27 MED ORDER — CYANOCOBALAMIN 1000 MCG/ML IJ SOLN
1000.0000 ug | Freq: Once | INTRAMUSCULAR | Status: AC
  Administered 2017-08-27: 1000 ug via INTRAMUSCULAR

## 2017-08-27 NOTE — Progress Notes (Signed)
Patient comes in for B 12 injection.  Injected left deltoid.  Patient tolerated injection well.   Reviewed.  Dr Scott 

## 2017-08-28 ENCOUNTER — Other Ambulatory Visit: Payer: Self-pay | Admitting: Internal Medicine

## 2017-09-13 ENCOUNTER — Ambulatory Visit (INDEPENDENT_AMBULATORY_CARE_PROVIDER_SITE_OTHER): Payer: Medicare Other | Admitting: Internal Medicine

## 2017-09-13 ENCOUNTER — Encounter: Payer: Self-pay | Admitting: Internal Medicine

## 2017-09-13 VITALS — BP 120/66 | HR 56 | Temp 98.6°F | Resp 12 | Ht 60.0 in | Wt 164.6 lb

## 2017-09-13 DIAGNOSIS — R252 Cramp and spasm: Secondary | ICD-10-CM

## 2017-09-13 DIAGNOSIS — D472 Monoclonal gammopathy: Secondary | ICD-10-CM | POA: Diagnosis not present

## 2017-09-13 DIAGNOSIS — E538 Deficiency of other specified B group vitamins: Secondary | ICD-10-CM

## 2017-09-13 DIAGNOSIS — L749 Eccrine sweat disorder, unspecified: Secondary | ICD-10-CM | POA: Diagnosis not present

## 2017-09-13 DIAGNOSIS — R739 Hyperglycemia, unspecified: Secondary | ICD-10-CM

## 2017-09-13 DIAGNOSIS — K209 Esophagitis, unspecified: Secondary | ICD-10-CM

## 2017-09-13 DIAGNOSIS — K754 Autoimmune hepatitis: Secondary | ICD-10-CM | POA: Diagnosis not present

## 2017-09-13 DIAGNOSIS — K2270 Barrett's esophagus without dysplasia: Secondary | ICD-10-CM | POA: Diagnosis not present

## 2017-09-13 DIAGNOSIS — R7989 Other specified abnormal findings of blood chemistry: Secondary | ICD-10-CM

## 2017-09-13 DIAGNOSIS — E039 Hypothyroidism, unspecified: Secondary | ICD-10-CM | POA: Diagnosis not present

## 2017-09-13 DIAGNOSIS — Z0000 Encounter for general adult medical examination without abnormal findings: Secondary | ICD-10-CM | POA: Diagnosis not present

## 2017-09-13 DIAGNOSIS — D649 Anemia, unspecified: Secondary | ICD-10-CM

## 2017-09-13 DIAGNOSIS — I10 Essential (primary) hypertension: Secondary | ICD-10-CM | POA: Diagnosis not present

## 2017-09-13 DIAGNOSIS — Z6832 Body mass index (BMI) 32.0-32.9, adult: Secondary | ICD-10-CM

## 2017-09-13 DIAGNOSIS — R945 Abnormal results of liver function studies: Secondary | ICD-10-CM

## 2017-09-13 MED ORDER — CHLORZOXAZONE 500 MG PO TABS: ORAL_TABLET | ORAL | 0 refills | Status: DC

## 2017-09-13 MED ORDER — LEVOTHYROXINE SODIUM 75 MCG PO TABS: 75.0000 ug | ORAL_TABLET | Freq: Every day | ORAL | 1 refills | Status: DC

## 2017-09-13 NOTE — Progress Notes (Signed)
Patient ID: Lori [name], female   DOB: 1934-02-09, 83 y.o.   MRN: [id number]   Subjective:    Patient ID: Lori [name], female    DOB: June 17, 1934, 83 y.o.   MRN: [id number]  HPI  Patient with past history of autoimmune hepatitis, B12 deficiency, GERD, hypercholesterolemia and hypertension.  She comes in today to follow up on these issues as well as for a complete physical exam.  She reports she is doing relatively well.  Tries to stay active.  Has noticed increased sweating with increased activity and exertion.  Notices when she takes the trash out and with cleaning in her house.  No chest pain.  No sob.  Is a change for her.  Started over the last two months.  No acid reflux.  No abdominal pain.  Bowels moving.  Eating and drinking well.  Blood pressures have been doing well.     Past Medical History:  Diagnosis Date  . Anemia    iron deficient  . Autoimmune hepatitis ([place]) 05/10/2016  . B12 deficiency   . Chronic kidney disease   . Diverticulosis   . Fibrocystic breast disease   . GERD (gastroesophageal reflux disease)   . Hypercholesterolemia   . Hypertension   . Hypothyroidism   . Ulcer disease    Past Surgical History:  Procedure Laterality Date  . ESOPHAGOGASTRODUODENOSCOPY (EGD) WITH PROPOFOL N/A 01/30/2016   Procedure: ESOPHAGOGASTRODUODENOSCOPY (EGD) WITH PROPOFOL;  Surgeon: Lollie Sails, MD;  Location: [facility] ENDOSCOPY;  Service: Endoscopy;  Laterality: N/A;  . HERNIA REPAIR    . lap hiatal hernia repair  07/08/08  . LEFT OOPHORECTOMY  1993   benign ovarian cyst  . TUBAL LIGATION  1981   Family History  Problem Relation Age of Onset  . Stroke Mother   . Breast cancer Neg Hx   . Colon cancer Neg Hx    Social History   Social History  . Marital status: Widowed    Spouse name: N/A  . Number of children: 0  . Years of education: N/A   Social History Main Topics  . Smoking status: Never Smoker  . Smokeless tobacco: Never Used  . Alcohol use No    . Drug use: No  . Sexual activity: No   Other Topics Concern  . None   Social History Narrative  . None    Outpatient Encounter Prescriptions as of 09/13/2017  Medication Sig  . aspirin 81 MG tablet Take 81 mg by mouth daily.  Marland Kitchen atenolol (TENORMIN) 25 MG tablet TAKE ONE (1) TABLET BY MOUTH EVERY DAY  . benazepril (LOTENSIN) 20 MG tablet TAKE ONE TABLET BY MOUTH EVERY DAY  . calcitonin, salmon, (MIACALCIN/FORTICAL) 200 UNIT/ACT nasal spray USE 1 SPRAY INTO ONE NOSTRIL DAILY ALTERNATE NOSTRILS  . Calcium Citrate (CITRACAL PO) Take 1,200 mg by mouth daily.  . chlorzoxazone (PARAFON) 500 MG tablet One tablet q hs prn  . Cholecalciferol (D3-1000 PO) Take by mouth daily.  . Cyanocobalamin (B-12) 1000 MCG/ML KIT Inject 1,000 Units as directed.  Marland Kitchen GLUCOSAMINE-CHONDROITIN PO Take 2 tablets by mouth daily.  Marland Kitchen levothyroxine (SYNTHROID, LEVOTHROID) 75 MCG tablet Take 1 tablet (75 mcg total) by mouth daily before breakfast.  . magnesium oxide (MAG-OX) 400 (241.3 Mg) MG tablet TAKE ONE (1) TABLET BY MOUTH EVERY DAY  . Mesalamine (DELZICOL) 400 MG CPDR Take 400 mg by mouth 3 (three) times daily.  . pantoprazole (PROTONIX) 40 MG tablet Take 40 mg by mouth daily.   Marland Kitchen  Probiotic Product (ALIGN) 4 MG CAPS Take 1 capsule by mouth daily.  . Wheat Dextrin (BENEFIBER DRINK MIX) PACK Take by mouth.  . zolpidem (AMBIEN CR) 6.25 MG CR tablet TAKE ONE TABLET BY MOUTH AT BEDTIME AS NEEDED  . [DISCONTINUED] chlorzoxazone (PARAFON) 500 MG tablet One tablet q hs prn  . [DISCONTINUED] levothyroxine (SYNTHROID, LEVOTHROID) 75 MCG tablet Take 1 tablet (75 mcg total) by mouth daily before breakfast.  . [DISCONTINUED] Mesalamine (DELZICOL) 400 MG CPDR DR capsule Take 400 mg by mouth 3 (three) times daily.   No facility-administered encounter medications on file as of 09/13/2017.     Review of Systems  Constitutional: Negative for appetite change and unexpected weight change.  HENT: Negative for congestion and sinus  pressure.   Eyes: Negative for pain and visual disturbance.  Respiratory: Negative for cough, chest tightness and shortness of breath.   Cardiovascular: Negative for chest pain, palpitations and leg swelling.  Gastrointestinal: Negative for abdominal pain, diarrhea, nausea and vomiting.  Endocrine:       Sweating as outlined.    Genitourinary: Negative for difficulty urinating and dysuria.  Musculoskeletal: Negative for back pain and joint swelling.  Skin: Negative for color change and rash.  Neurological: Negative for dizziness, light-headedness and headaches.  Hematological: Negative for adenopathy. Does not bruise/bleed easily.  Psychiatric/Behavioral: Negative for agitation and dysphoric mood.       Objective:     Blood pressure rechecked by me:  122/68  Physical Exam  Constitutional: She is oriented to person, place, and time. She appears well-developed and well-nourished. No distress.  HENT:  Nose: Nose normal.  Mouth/Throat: Oropharynx is clear and moist.  Eyes: Right eye exhibits no discharge. Left eye exhibits no discharge. No scleral icterus.  Neck: Neck supple. No thyromegaly present.  Cardiovascular: Normal rate and regular rhythm.   Pulmonary/Chest: Breath sounds normal. No accessory muscle usage. No tachypnea. No respiratory distress. She has no decreased breath sounds. She has no wheezes. She has no rhonchi. Right breast exhibits no inverted nipple, no mass, no nipple discharge and no tenderness (no axillary adenopathy). Left breast exhibits no inverted nipple, no mass, no nipple discharge and no tenderness (no axilarry adenopathy).  Abdominal: Soft. Bowel sounds are normal. There is no tenderness.  Musculoskeletal: She exhibits no edema or tenderness.  Lymphadenopathy:    She has no cervical adenopathy.  Neurological: She is alert and oriented to person, place, and time.  Skin: Skin is warm. No rash noted. No erythema.  Psychiatric: She has a normal mood and affect.  Her behavior is normal.    BP 120/66 (BP Location: Left Arm, Patient Position: Sitting, Cuff Size: Normal)   Pulse (!) 56   Temp 98.6 F (37 C) (Oral)   Resp 12   Ht 5' (1.524 m)   Wt 164 lb 9.6 oz (74.7 kg)   SpO2 95%   BMI 32.15 kg/m  Wt Readings from Last 3 Encounters:  09/13/17 164 lb 9.6 oz (74.7 kg)  05/21/17 164 lb 3.2 oz (74.5 kg)  03/26/17 166 lb 12.8 oz (75.7 kg)     Lab Results  Component Value Date   WBC 5.2 05/21/2017   HGB 13.1 05/21/2017   HCT 39.2 05/21/2017   PLT 213.0 05/21/2017   GLUCOSE 94 09/13/2017   CHOL 169 09/13/2017   TRIG 103 09/13/2017   HDL 46 (L) 09/13/2017   LDLCALC 86 09/10/2016   ALT 10 09/13/2017   AST 15 09/13/2017   NA 139 09/13/2017  K 4.1 09/13/2017   CL 104 09/13/2017   CREATININE 1.18 (H) 09/13/2017   BUN 25 09/13/2017   CO2 25 09/13/2017   TSH 1.45 05/21/2017   INR 1.03 05/13/2015   HGBA1C 5.6 09/13/2017    Mm Screening Breast Tomo Bilateral  Result Date: 05/16/2017 CLINICAL DATA:  Screening. EXAM: 2D DIGITAL SCREENING BILATERAL MAMMOGRAM WITH CAD AND ADJUNCT TOMO COMPARISON:  Previous exam(s). ACR Breast Density Category c: The breast tissue is heterogeneously dense, which may obscure small masses. FINDINGS: There are no findings suspicious for malignancy. Images were processed with CAD. IMPRESSION: No mammographic evidence of malignancy. A result letter of this screening mammogram will be mailed directly to the patient. RECOMMENDATION: Screening mammogram in one year. (Code:SM-B-01Y) BI-RADS CATEGORY  1: Negative. Electronically Signed   By: Curlene Dolphin M.D.   On: 05/16/2017 16:47       Assessment & Plan:   Problem List Items Addressed This Visit    Abnormal liver function tests    Worked up by GI.  See notes.  Follow liver panel.        Anemia    Was being followed by hematology.  Found to be B12 deficient and iron deficient and MGUS.  Follow.        Autoimmune hepatitis ([place])    Followed by GI.  Recheck  liver function tests and immunoglobulins as requested by GI.        Relevant Orders   Hepatic function panel (Completed)   Basic metabolic panel (Completed)   B12 deficiency    Continue B12 injections.       Barrett's esophagus with esophagitis    EGD 01/30/16 as outlined.  Followed by GI.       BMI 32.0-32.9,adult    Diet and exercise.  Follow.       Health care maintenance    Physical today 09/13/17.  Mammogram 05/16/17 - birads I.  Followed by GI.       Hyperbilirubinemia    Followed by GI.  Follow liver panel.         Hyperglycemia    Low carb diet and exercise.  Follow met b and a1c.        Relevant Orders   Hemoglobin A1c (Completed)   Hypertension    Blood pressure under good control.  Continue same medication regimen.  Follow pressures.  Follow metabolic panel.        Relevant Orders   Lipid panel (Completed)   Hypothyroidism    On thyroid replacement.  Follow tsh.       Relevant Medications   levothyroxine (SYNTHROID, LEVOTHROID) 75 MCG tablet   Leg cramps    Some occasional cramps.  Not a significant issue for her now.  Follow.  Check metabolic panel.        Relevant Medications   chlorzoxazone (PARAFON) 500 MG tablet   MGUS (monoclonal gammopathy of unknown significance)    Followed by hematology.  Has been stable.        Relevant Orders   Immunoglobulins, QN, A/E/G/M (Completed)   Sweating abnormality - Primary    Given sweating with increased activity and exertion, EKG - SR with non specific changes noted.  No chest pain.  Does have risk factors.  Change over the last two months.  No clear etiology.  Sugar ok.  Thyroid wnl.  Will have cardiology review to confirm if any further cardiac w/up warranted.        Relevant Orders   EKG  12-Lead (Completed)       Einar Pheasant, MD

## 2017-09-13 NOTE — Assessment & Plan Note (Signed)
Physical today 09/13/17.  Mammogram 05/16/17 - birads I.  Followed by GI.

## 2017-09-14 LAB — LIPID PANEL
Cholesterol: 169 mg/dL (ref <200)
HDL: 46 mg/dL — ABNORMAL LOW (ref >50)
LDL CHOLESTEROL (CALC): 103 mg/dL — AB
Non-HDL Cholesterol (Calc): 123 mg/dL (calc) (ref <130)
TRIGLYCERIDES: 103 mg/dL (ref <150)
Total CHOL/HDL Ratio: 3.7 (calc) (ref <5.0)

## 2017-09-14 LAB — HEPATIC FUNCTION PANEL
AG Ratio: 1.2 (calc) (ref 1.0–2.5)
ALBUMIN MSPROF: 4.1 g/dL (ref 3.6–5.1)
ALT: 10 U/L (ref 6–29)
AST: 15 U/L (ref 10–35)
Alkaline phosphatase (APISO): 44 U/L (ref 33–130)
BILIRUBIN TOTAL: 1.4 mg/dL — AB (ref 0.2–1.2)
Bilirubin, Direct: 0.2 mg/dL (ref 0.0–0.2)
GLOBULIN: 3.4 g/dL (ref 1.9–3.7)
Indirect Bilirubin: 1.2 mg/dL (calc) (ref 0.2–1.2)
Total Protein: 7.5 g/dL (ref 6.1–8.1)

## 2017-09-14 LAB — BASIC METABOLIC PANEL
BUN/Creatinine Ratio: 21 (calc) (ref 6–22)
BUN: 25 mg/dL (ref 7–25)
CALCIUM: 9.6 mg/dL (ref 8.6–10.4)
CO2: 25 mmol/L (ref 20–32)
Chloride: 104 mmol/L (ref 98–110)
Creat: 1.18 mg/dL — ABNORMAL HIGH (ref 0.60–0.88)
Glucose, Bld: 94 mg/dL (ref 65–99)
POTASSIUM: 4.1 mmol/L (ref 3.5–5.3)
SODIUM: 139 mmol/L (ref 135–146)

## 2017-09-14 LAB — HEMOGLOBIN A1C
EAG (MMOL/L): 6.3 (calc)
Hgb A1c MFr Bld: 5.6 % of total Hgb (ref <5.7)
MEAN PLASMA GLUCOSE: 114 (calc)

## 2017-09-15 ENCOUNTER — Encounter: Payer: Self-pay | Admitting: Internal Medicine

## 2017-09-15 DIAGNOSIS — L749 Eccrine sweat disorder, unspecified: Secondary | ICD-10-CM | POA: Insufficient documentation

## 2017-09-15 NOTE — Assessment & Plan Note (Signed)
Followed by GI.  Recheck liver function tests and immunoglobulins as requested by GI.

## 2017-09-15 NOTE — Assessment & Plan Note (Signed)
On thyroid replacement.  Follow tsh.  

## 2017-09-15 NOTE — Assessment & Plan Note (Signed)
Low carb diet and exercise.  Follow met b and a1c.   

## 2017-09-15 NOTE — Assessment & Plan Note (Signed)
Given sweating with increased activity and exertion, EKG - SR with non specific changes noted.  No chest pain.  Does have risk factors.  Change over the last two months.  No clear etiology.  Sugar ok.  Thyroid wnl.  Will have cardiology review to confirm if any further cardiac w/up warranted.

## 2017-09-15 NOTE — Assessment & Plan Note (Signed)
Was being followed by hematology.  Found to be B12 deficient and iron deficient and MGUS.  Follow.

## 2017-09-15 NOTE — Assessment & Plan Note (Signed)
Continue B12 injections.   

## 2017-09-15 NOTE — Assessment & Plan Note (Signed)
Worked up by GI.  See notes.  Follow liver panel.

## 2017-09-15 NOTE — Assessment & Plan Note (Signed)
EGD 01/30/16 as outlined.  Followed by GI.

## 2017-09-15 NOTE — Assessment & Plan Note (Signed)
Blood pressure under good control.  Continue same medication regimen.  Follow pressures.  Follow metabolic panel.   

## 2017-09-15 NOTE — Assessment & Plan Note (Signed)
Some occasional cramps.  Not a significant issue for her now.  Follow.  Check metabolic panel.

## 2017-09-15 NOTE — Assessment & Plan Note (Signed)
Followed by hematology.  Has been stable.   

## 2017-09-15 NOTE — Assessment & Plan Note (Signed)
Diet and exercise.  Follow.  

## 2017-09-15 NOTE — Assessment & Plan Note (Signed)
Followed by GI.  Follow liver panel.

## 2017-09-17 ENCOUNTER — Other Ambulatory Visit: Payer: Self-pay | Admitting: Internal Medicine

## 2017-09-17 DIAGNOSIS — R7989 Other specified abnormal findings of blood chemistry: Secondary | ICD-10-CM

## 2017-09-17 DIAGNOSIS — R001 Bradycardia, unspecified: Secondary | ICD-10-CM | POA: Diagnosis not present

## 2017-09-17 DIAGNOSIS — I10 Essential (primary) hypertension: Secondary | ICD-10-CM | POA: Diagnosis not present

## 2017-09-17 DIAGNOSIS — R9431 Abnormal electrocardiogram [ECG] [EKG]: Secondary | ICD-10-CM | POA: Diagnosis not present

## 2017-09-17 LAB — IMMUNOGLOBULINS A/E/G/M, SERUM
IGA/IMMUNOGLOBULIN A, SERUM: 366 mg/dL (ref 64–422)
IGM (IMMUNOGLOBULIN M), SRM: 85 mg/dL (ref 26–217)
IgE (Immunoglobulin E), Serum: 8 IU/mL (ref 0–100)
IgG (Immunoglobin G), Serum: 1685 mg/dL — ABNORMAL HIGH (ref 700–1600)

## 2017-09-17 NOTE — Progress Notes (Signed)
Order placed for f/u met b.  

## 2017-09-23 DIAGNOSIS — R001 Bradycardia, unspecified: Secondary | ICD-10-CM | POA: Diagnosis not present

## 2017-09-26 DIAGNOSIS — R9431 Abnormal electrocardiogram [ECG] [EKG]: Secondary | ICD-10-CM | POA: Diagnosis not present

## 2017-09-26 DIAGNOSIS — R001 Bradycardia, unspecified: Secondary | ICD-10-CM | POA: Diagnosis not present

## 2017-09-26 DIAGNOSIS — I10 Essential (primary) hypertension: Secondary | ICD-10-CM | POA: Diagnosis not present

## 2017-09-27 ENCOUNTER — Other Ambulatory Visit (INDEPENDENT_AMBULATORY_CARE_PROVIDER_SITE_OTHER): Payer: Medicare Other

## 2017-09-27 DIAGNOSIS — R7989 Other specified abnormal findings of blood chemistry: Secondary | ICD-10-CM

## 2017-09-27 LAB — BASIC METABOLIC PANEL
BUN: 19 mg/dL (ref 6–23)
CALCIUM: 9.7 mg/dL (ref 8.4–10.5)
CO2: 27 meq/L (ref 19–32)
CREATININE: 0.94 mg/dL (ref 0.40–1.20)
Chloride: 104 mEq/L (ref 96–112)
GFR: 60.75 mL/min (ref >60.00)
GLUCOSE: 107 mg/dL — AB (ref 70–99)
Potassium: 4.4 mEq/L (ref 3.5–5.1)
Sodium: 138 mEq/L (ref 135–145)

## 2017-09-29 ENCOUNTER — Encounter: Payer: Self-pay | Admitting: Internal Medicine

## 2017-10-01 ENCOUNTER — Ambulatory Visit (INDEPENDENT_AMBULATORY_CARE_PROVIDER_SITE_OTHER): Payer: Medicare Other

## 2017-10-01 ENCOUNTER — Ambulatory Visit: Payer: Medicare Other

## 2017-10-01 DIAGNOSIS — Z23 Encounter for immunization: Secondary | ICD-10-CM

## 2017-10-01 DIAGNOSIS — E538 Deficiency of other specified B group vitamins: Secondary | ICD-10-CM | POA: Diagnosis not present

## 2017-10-01 MED ORDER — CYANOCOBALAMIN 1000 MCG/ML IJ SOLN
1000.0000 ug | Freq: Once | INTRAMUSCULAR | Status: AC
  Administered 2017-10-01: 1000 ug via INTRAMUSCULAR

## 2017-10-01 NOTE — Progress Notes (Signed)
Patient comes in for B 12 injection and flu injection.  B 12 injection given in right deltoid.  Flu injection given in left deltoid.  Patient tolerated injections well.    Reviewed.  Dr Nicki Reaper

## 2017-10-02 DIAGNOSIS — I10 Essential (primary) hypertension: Secondary | ICD-10-CM | POA: Diagnosis not present

## 2017-10-02 DIAGNOSIS — R001 Bradycardia, unspecified: Secondary | ICD-10-CM | POA: Diagnosis not present

## 2017-10-30 ENCOUNTER — Other Ambulatory Visit: Payer: Self-pay | Admitting: Internal Medicine

## 2017-10-30 NOTE — Telephone Encounter (Signed)
Last script 08/28/17 #30 1 rf  Last o/v 09/13/17 F/u 01/13/18

## 2017-10-31 NOTE — Telephone Encounter (Signed)
rx ok'd for ambien #30 with one refill.   

## 2017-11-05 ENCOUNTER — Ambulatory Visit (INDEPENDENT_AMBULATORY_CARE_PROVIDER_SITE_OTHER): Payer: Medicare Other

## 2017-11-05 ENCOUNTER — Telehealth: Payer: Self-pay

## 2017-11-05 ENCOUNTER — Encounter: Payer: Self-pay | Admitting: Internal Medicine

## 2017-11-05 DIAGNOSIS — E538 Deficiency of other specified B group vitamins: Secondary | ICD-10-CM | POA: Diagnosis not present

## 2017-11-05 MED ORDER — CYANOCOBALAMIN 1000 MCG/ML IJ SOLN
1000.0000 ug | Freq: Once | INTRAMUSCULAR | Status: AC
  Administered 2017-11-05: 1000 ug via INTRAMUSCULAR

## 2017-11-05 NOTE — Telephone Encounter (Signed)
Letter received from patient. Placed in blue folder for your review.

## 2017-11-05 NOTE — Telephone Encounter (Signed)
Letter typed and printed.

## 2017-11-05 NOTE — Progress Notes (Signed)
Patient comes in for B 12 injection.  Injected  Left deltoid.  Patient tolerated injection well.   Reviewed.  Dr Nicki Reaper

## 2017-11-07 NOTE — Telephone Encounter (Signed)
Called number on ppw to find out where to fax did auth on the phone  reference number [id number] Effective 08/09/17 to 11/07/17

## 2017-11-11 ENCOUNTER — Other Ambulatory Visit: Payer: Self-pay | Admitting: Internal Medicine

## 2017-12-10 ENCOUNTER — Ambulatory Visit: Payer: Medicare Other

## 2017-12-11 ENCOUNTER — Other Ambulatory Visit: Payer: Self-pay | Admitting: Internal Medicine

## 2017-12-14 ENCOUNTER — Encounter: Payer: Self-pay | Admitting: Internal Medicine

## 2017-12-16 NOTE — Telephone Encounter (Signed)
Lm for pt to call back and schedule B12

## 2017-12-17 ENCOUNTER — Ambulatory Visit (INDEPENDENT_AMBULATORY_CARE_PROVIDER_SITE_OTHER): Payer: Medicare Other

## 2017-12-17 DIAGNOSIS — E538 Deficiency of other specified B group vitamins: Secondary | ICD-10-CM

## 2017-12-17 MED ORDER — CYANOCOBALAMIN 1000 MCG/ML IJ SOLN
1000.0000 ug | Freq: Once | INTRAMUSCULAR | Status: AC
  Administered 2017-12-17: 1000 ug via INTRAMUSCULAR

## 2017-12-17 NOTE — Progress Notes (Addendum)
Patient presents today for B12 injection. Right deltoid. Patient voiced no concerns nor showed any distress during injection.   Reviewed.  Dr Nicki Reaper

## 2017-12-25 ENCOUNTER — Other Ambulatory Visit: Payer: Self-pay | Admitting: Internal Medicine

## 2017-12-25 DIAGNOSIS — R252 Cramp and spasm: Secondary | ICD-10-CM

## 2018-01-01 ENCOUNTER — Other Ambulatory Visit: Payer: Self-pay | Admitting: Gastroenterology

## 2018-01-01 ENCOUNTER — Other Ambulatory Visit: Payer: Self-pay | Admitting: Internal Medicine

## 2018-01-01 DIAGNOSIS — K7460 Unspecified cirrhosis of liver: Secondary | ICD-10-CM | POA: Diagnosis not present

## 2018-01-02 DIAGNOSIS — I10 Essential (primary) hypertension: Secondary | ICD-10-CM | POA: Diagnosis not present

## 2018-01-02 DIAGNOSIS — R9431 Abnormal electrocardiogram [ECG] [EKG]: Secondary | ICD-10-CM | POA: Diagnosis not present

## 2018-01-02 DIAGNOSIS — I491 Atrial premature depolarization: Secondary | ICD-10-CM | POA: Diagnosis not present

## 2018-01-02 NOTE — Telephone Encounter (Signed)
Okay to refill Ambien?  LOV: 09/13/17 NOV: 01/13/18

## 2018-01-02 NOTE — Telephone Encounter (Signed)
rx ok'd for ambien #30 with 2 refills.   

## 2018-01-03 NOTE — Telephone Encounter (Signed)
Rx faxed to [facility].

## 2018-01-13 ENCOUNTER — Encounter: Payer: Self-pay | Admitting: Student

## 2018-01-13 ENCOUNTER — Ambulatory Visit: Payer: Medicare Other | Admitting: Internal Medicine

## 2018-01-14 ENCOUNTER — Ambulatory Visit
Admission: RE | Admit: 2018-01-14 | Discharge: 2018-01-14 | Disposition: A | Discharge status: Home / Self Care | Payer: Medicare Other | Source: Ambulatory Visit | Attending: Gastroenterology | Admitting: Gastroenterology

## 2018-01-14 ENCOUNTER — Encounter: Payer: Self-pay | Admitting: *Deleted

## 2018-01-14 ENCOUNTER — Encounter
Admission: RE | Disposition: A | Discharge status: Home / Self Care | Payer: Self-pay | Source: Ambulatory Visit | Attending: Gastroenterology

## 2018-01-14 ENCOUNTER — Ambulatory Visit: Payer: Medicare Other | Admitting: Anesthesiology

## 2018-01-14 DIAGNOSIS — N189 Chronic kidney disease, unspecified: Secondary | ICD-10-CM | POA: Diagnosis not present

## 2018-01-14 DIAGNOSIS — E039 Hypothyroidism, unspecified: Secondary | ICD-10-CM | POA: Insufficient documentation

## 2018-01-14 DIAGNOSIS — Z79899 Other long term (current) drug therapy: Secondary | ICD-10-CM | POA: Insufficient documentation

## 2018-01-14 DIAGNOSIS — E7800 Pure hypercholesterolemia, unspecified: Secondary | ICD-10-CM | POA: Diagnosis not present

## 2018-01-14 DIAGNOSIS — I129 Hypertensive chronic kidney disease with stage 1 through stage 4 chronic kidney disease, or unspecified chronic kidney disease: Secondary | ICD-10-CM | POA: Diagnosis not present

## 2018-01-14 DIAGNOSIS — K2950 Unspecified chronic gastritis without bleeding: Secondary | ICD-10-CM | POA: Diagnosis not present

## 2018-01-14 DIAGNOSIS — Z880 Allergy status to penicillin: Secondary | ICD-10-CM | POA: Insufficient documentation

## 2018-01-14 DIAGNOSIS — Z7982 Long term (current) use of aspirin: Secondary | ICD-10-CM | POA: Insufficient documentation

## 2018-01-14 DIAGNOSIS — K219 Gastro-esophageal reflux disease without esophagitis: Secondary | ICD-10-CM | POA: Diagnosis not present

## 2018-01-14 DIAGNOSIS — K2970 Gastritis, unspecified, without bleeding: Secondary | ICD-10-CM | POA: Insufficient documentation

## 2018-01-14 DIAGNOSIS — K2270 Barrett's esophagus without dysplasia: Secondary | ICD-10-CM | POA: Insufficient documentation

## 2018-01-14 DIAGNOSIS — I8500 Esophageal varices without bleeding: Secondary | ICD-10-CM | POA: Diagnosis not present

## 2018-01-14 DIAGNOSIS — E538 Deficiency of other specified B group vitamins: Secondary | ICD-10-CM | POA: Diagnosis not present

## 2018-01-14 DIAGNOSIS — K2960 Other gastritis without bleeding: Secondary | ICD-10-CM | POA: Diagnosis not present

## 2018-01-14 HISTORY — PX: ESOPHAGOGASTRODUODENOSCOPY (EGD) WITH PROPOFOL: SHX5813

## 2018-01-14 HISTORY — DX: Unspecified osteoarthritis, unspecified site: M19.90

## 2018-01-14 LAB — HM COLONOSCOPY

## 2018-01-14 SURGERY — ESOPHAGOGASTRODUODENOSCOPY (EGD) WITH PROPOFOL
Anesthesia: General

## 2018-01-14 MED ORDER — LIDOCAINE HCL (PF) 2 % IJ SOLN
INTRAMUSCULAR | Status: AC
  Filled 2018-01-14: qty 10

## 2018-01-14 MED ORDER — FENTANYL CITRATE (PF) 100 MCG/2ML IJ SOLN
INTRAMUSCULAR | Status: AC
  Filled 2018-01-14: qty 2

## 2018-01-14 MED ORDER — SODIUM CHLORIDE 0.9 % IV SOLN: INTRAVENOUS | Status: DC

## 2018-01-14 MED ORDER — PROPOFOL 10 MG/ML IV BOLUS
INTRAVENOUS | Status: AC
  Filled 2018-01-14: qty 20

## 2018-01-14 MED ORDER — GLYCOPYRROLATE 0.2 MG/ML IJ SOLN
INTRAMUSCULAR | Status: DC | PRN
  Administered 2018-01-14: 0.2 mg via INTRAVENOUS

## 2018-01-14 MED ORDER — PROPOFOL 500 MG/50ML IV EMUL
INTRAVENOUS | Status: DC | PRN
  Administered 2018-01-14: 120 ug/kg/min via INTRAVENOUS

## 2018-01-14 MED ORDER — LIDOCAINE 2% (20 MG/ML) 5 ML SYRINGE
INTRAMUSCULAR | Status: DC | PRN
  Administered 2018-01-14: 40 mg via INTRAVENOUS

## 2018-01-14 MED ORDER — PROPOFOL 10 MG/ML IV BOLUS
INTRAVENOUS | Status: DC | PRN
  Administered 2018-01-14: 100 mg via INTRAVENOUS

## 2018-01-14 MED ORDER — FENTANYL CITRATE (PF) 100 MCG/2ML IJ SOLN
INTRAMUSCULAR | Status: DC | PRN
  Administered 2018-01-14: 50 ug via INTRAVENOUS

## 2018-01-14 MED ORDER — SODIUM CHLORIDE 0.9 % IV SOLN
INTRAVENOUS | Status: DC
  Administered 2018-01-14 (×2): via INTRAVENOUS

## 2018-01-14 MED ORDER — GLYCOPYRROLATE 0.2 MG/ML IJ SOLN
INTRAMUSCULAR | Status: AC
  Filled 2018-01-14: qty 1

## 2018-01-14 MED ORDER — PROPOFOL 500 MG/50ML IV EMUL
INTRAVENOUS | Status: AC
  Filled 2018-01-14: qty 50

## 2018-01-14 MED ORDER — EPHEDRINE SULFATE 50 MG/ML IJ SOLN
INTRAMUSCULAR | Status: AC
  Filled 2018-01-14: qty 1

## 2018-01-14 MED ORDER — PHENYLEPHRINE HCL 10 MG/ML IJ SOLN
INTRAMUSCULAR | Status: AC
  Filled 2018-01-14: qty 1

## 2018-01-14 NOTE — H&P (Addendum)
Outpatient short stay form Pre-procedure 01/14/2018 7:40 AM Lollie Sails MD  Primary Physician: Dr. Einar Pheasant  Reason for visit:  EGD  History of present illness:  Patient is a 84 year old female with a personal history of long segment Barrett's esophagus. It is of note that she also has a history of autoimmune hepatitis. This is been stable however she has been seen to have small varices in distal esophagus. This has more of a potential problem in terms of doing esophageal  Barrett's biopsies. I discussed that with her today. Patient does take 81 mg aspirin that has been held for several days with the exception of one day in the middle. She takes no other aspirin products or blood thinning agents.    Current Facility-Administered Medications:  .  0.9 %  sodium chloride infusion, , Intravenous, Continuous, Lollie Sails, MD, Last Rate: 20 mL/hr at 01/14/18 0725 .  0.9 %  sodium chloride infusion, , Intravenous, Continuous, Lollie Sails, MD  Medications Prior to Admission  Medication Sig Dispense Refill Last Dose  . aspirin 81 MG tablet Take 81 mg by mouth daily.   Past Week at Unknown time  . atenolol (TENORMIN) 25 MG tablet TAKE ONE (1) TABLET BY MOUTH EVERY DAY 90 tablet 1 01/14/2018 at Unknown time  . benazepril (LOTENSIN) 20 MG tablet TAKE ONE TABLET BY MOUTH EVERY DAY 90 tablet 1 01/14/2018 at Unknown time  . Calcium Citrate (CITRACAL PO) Take 1,200 mg by mouth daily.   01/13/2018 at Unknown time  . Cholecalciferol (D3-1000 PO) Take by mouth daily.   Past Week at Unknown time  . Cyanocobalamin (B-12) 1000 MCG/ML KIT Inject 1,000 Units as directed.   Past Week at Unknown time  . levothyroxine (SYNTHROID, LEVOTHROID) 75 MCG tablet Take 1 tablet (75 mcg total) by mouth daily before breakfast. 90 tablet 1 01/13/2018 at Unknown time  . Magnesium Oxide 400 (240 Mg) MG TABS TAKE ONE (1) TABLET BY MOUTH EVERY DAY 30 tablet 1 Past Week at Unknown time  . pantoprazole (PROTONIX)  40 MG tablet Take 40 mg by mouth daily.    01/13/2018 at Unknown time  . zolpidem (AMBIEN CR) 6.25 MG CR tablet TAKE ONE TABLET BY MOUTH AT BEDTIME AS NEEDED. 30 tablet 2 Past Week at Unknown time  . calcitonin, salmon, (MIACALCIN/FORTICAL) 200 UNIT/ACT nasal spray USE 1 SPRAY INTO ONE NOSTRIL DAILY ALTERNATE NOSTRILS 3.7 mL 5   . chlorzoxazone (PARAFON) 500 MG tablet TAKE ONE TABLET BY MOUTH AT BEDTIME AS NEEDED 30 tablet 0   . GLUCOSAMINE-CHONDROITIN PO Take 2 tablets by mouth daily.   Taking  . HYDROcodone-acetaminophen (NORCO/VICODIN) 5-325 MG tablet Take 1 tablet by mouth every 6 (six) hours as needed for moderate pain.   Not Taking at Unknown time  . Mesalamine (DELZICOL) 400 MG CPDR Take 400 mg by mouth 3 (three) times daily.   Taking  . Probiotic Product (ALIGN) 4 MG CAPS Take 1 capsule by mouth daily.   Taking  . Wheat Dextrin (BENEFIBER DRINK MIX) PACK Take by mouth.   Taking     Allergies  Allergen Reactions  . Penicillins   . Daypro [Oxaprozin] Rash  . Lodine [Etodolac] Rash     Past Medical History:  Diagnosis Date  . Anemia    iron deficient  . Arthritis   . Autoimmune hepatitis ([place]) 05/10/2016  . B12 deficiency   . Chronic kidney disease   . Diverticulosis   . Fibrocystic breast disease   .  GERD (gastroesophageal reflux disease)   . Hypercholesterolemia   . Hypertension   . Hypothyroidism   . Ulcer disease     Review of systems:      Physical Exam    Heart and lungs: Regular rate and rhythm without rub or gallop, lungs are bilaterally clear    HEENT: Normocephalic atraumatic eyes are anicteric    Other:     Pertinant exam for procedure: Soft nontender nondistended bowel sounds positive normoactive.    Planned proceedures: EGD and indicated procedures. I have discussed the risks benefits and complications of procedures to include not limited to bleeding, infection, perforation and the risk of sedation and the patient wishes to proceed.    Lollie Sails, MD Gastroenterology 01/14/2018  7:40 AM

## 2018-01-14 NOTE — Op Note (Addendum)
[facility] Gastroenterology Patient Name: Lori [name] Procedure Date: 01/14/2018 7:28 AM MRN: [id number] Account #: [id number] Date of Birth: 25-May-1934 Admit Type: Outpatient Age: 84 Room: [facility] ENDO ROOM 3 Gender: Female Note Status: Finalized Procedure:            Upper GI endoscopy Indications:          Follow-up of Barrett's esophagus Providers:            Lollie Sails, MD Referring MD:         Einar Pheasant, MD (Referring MD) Complications:        No immediate complications. Procedure:            Pre-Anesthesia Assessment:                       - ASA Grade Assessment: III - A patient with severe                        systemic disease.                       After obtaining informed consent, the endoscope was                        passed under direct vision. Throughout the procedure,                        the patient's blood pressure, pulse, and oxygen                        saturations were monitored continuously. The Endoscope                        was introduced through the mouth, and advanced to the                        third part of duodenum. Findings:      There were esophageal mucosal changes secondary to established       long-segment Barrett's disease present in the lower third of the       esophagus. The maximum longitudinal extent of these mucosal changes was       9 cm in length. Mucosa appeared smooth. NBI negative. Biopsy is       contraindicated because multiple grade 1-2 esophageal varices. There are       no red marks      The exam of the esophagus was otherwise normal.      A small amount of food (residue) was found in the gastric body.      Diffuse mild inflammation characterized by congestion (edema) and       erythema was found in the gastric body.      The examined duodenum was normal.      The cardia and gastric fundus were normal on retroflexion. Impression:           - Esophageal mucosal changes secondary to  established                        long-segment Barrett's disease. Biopsy is                        contraindicated.                       -  A small amount of food (residue) in the stomach.                       - Bile gastritis.                       - Normal examined duodenum.                       - No specimens collected. Recommendation:       - Discharge patient to home.                       - Will discuss possible referral to tertiary care                        institution for further evaluation, versus closer                        surveillance interval. Continue ppi bid.                       - Return to my office in 2 weeks. Procedure Code(s):    --- Professional ---                       [id number], Esophagogastroduodenoscopy, flexible, transoral;                        diagnostic, including collection of specimen(s) by                        brushing or washing, when performed (separate procedure) Diagnosis Code(s):    --- Professional ---                       K22.70, Barrett's esophagus without dysplasia                       K29.60, Other gastritis without bleeding CPT copyright 2016 American Medical Association. All rights reserved. The codes documented in this report are preliminary and upon coder review may  be revised to meet current compliance requirements. Lollie Sails, MD 01/14/2018 8:11:53 AM This report has been signed electronically. Number of Addenda: 0 Note Initiated On: 01/14/2018 7:28 AM      [facility]

## 2018-01-14 NOTE — Anesthesia Preprocedure Evaluation (Signed)
Anesthesia Evaluation  Patient identified by MRN, date of birth, ID band Patient awake    Reviewed: Allergy & Precautions, H&P , NPO status , Patient's Chart, lab work & pertinent test results, reviewed documented beta blocker date and time   History of Anesthesia Complications Negative for: history of anesthetic complications  Airway Mallampati: II  TM Distance: >3 FB Neck ROM: full    Dental   Pulmonary neg pulmonary ROS,           Cardiovascular Exercise Tolerance: Good hypertension, (-) angina(-) CAD, (-) Past MI, (-) Cardiac Stents and (-) CABG (-) dysrhythmias (-) Valvular Problems/Murmurs     Neuro/Psych negative neurological ROS  negative psych ROS   GI/Hepatic GERD  ,(+) Hepatitis -, C  Endo/Other  neg diabetesHypothyroidism   Renal/GU CRFRenal disease  negative genitourinary   Musculoskeletal   Abdominal   Peds  Hematology  (+) Blood dyscrasia, anemia ,   Anesthesia Other Findings Past Medical History: No date: Anemia     Comment:  iron deficient No date: Arthritis 05/10/2016: Autoimmune hepatitis ([place]) No date: B12 deficiency No date: Chronic kidney disease No date: Diverticulosis No date: Fibrocystic breast disease No date: GERD (gastroesophageal reflux disease) No date: Hypercholesterolemia No date: Hypertension No date: Hypothyroidism No date: Ulcer disease   Reproductive/Obstetrics negative OB ROS                             Anesthesia Physical Anesthesia Plan  ASA: III  Anesthesia Plan: General   Post-op Pain Management:    Induction: Intravenous  PONV Risk Score and Plan: 3 and Propofol infusion  Airway Management Planned: Nasal Cannula  Additional Equipment:   Intra-op Plan:   Post-operative Plan:   Informed Consent: I have reviewed the patients History and Physical, chart, labs and discussed the procedure including the risks, benefits and  alternatives for the proposed anesthesia with the patient or authorized representative who has indicated his/her understanding and acceptance.   Dental Advisory Given  Plan Discussed with: Anesthesiologist, CRNA and Surgeon  Anesthesia Plan Comments:         Anesthesia Quick Evaluation

## 2018-01-14 NOTE — Transfer of Care (Signed)
Immediate Anesthesia Transfer of Care Note  Patient: Lori [name]  Procedure(s) Performed: ESOPHAGOGASTRODUODENOSCOPY (EGD) WITH PROPOFOL (N/A )  Patient Location: PACU and Endoscopy Unit  Anesthesia Type:General  Level of Consciousness: awake, drowsy and patient cooperative  Airway & Oxygen Therapy: Patient Spontanous Breathing and Patient connected to nasal cannula oxygen  Post-op Assessment: Report given to RN and Post -op Vital signs reviewed and stable  Post vital signs: Reviewed and stable  Last Vitals:  Vitals:   01/14/18 0656 01/14/18 0800  BP: (!) 181/52   Pulse: (!) 52 (P) 62  Resp: 16   Temp: 37.1 C (!) (P) 35.9 C  SpO2: 98%     Last Pain:  Vitals:   01/14/18 0800  TempSrc: (P) Tympanic         Complications: No apparent anesthesia complications

## 2018-01-14 NOTE — Anesthesia Post-op Follow-up Note (Signed)
Anesthesia QCDR form completed.        

## 2018-01-14 NOTE — Anesthesia Postprocedure Evaluation (Signed)
Anesthesia Post Note  Patient: Lori [name]  Procedure(s) Performed: ESOPHAGOGASTRODUODENOSCOPY (EGD) WITH PROPOFOL (N/A )  Patient location during evaluation: Endoscopy Anesthesia Type: General Level of consciousness: awake and alert Pain management: pain level controlled Vital Signs Assessment: post-procedure vital signs reviewed and stable Respiratory status: spontaneous breathing, nonlabored ventilation, respiratory function stable and patient connected to nasal cannula oxygen Cardiovascular status: blood pressure returned to baseline and stable Postop Assessment: no apparent nausea or vomiting Anesthetic complications: no     Last Vitals:  Vitals:   01/14/18 0830 01/14/18 0839  BP:  (!) 121/50  Pulse: (!) 57 (!) 55  Resp: 17 14  Temp:    SpO2: 96% 98%    Last Pain:  Vitals:   01/14/18 0800  TempSrc: Tympanic                 Martha Clan

## 2018-01-15 ENCOUNTER — Encounter: Payer: Self-pay | Admitting: Gastroenterology

## 2018-01-21 ENCOUNTER — Ambulatory Visit (INDEPENDENT_AMBULATORY_CARE_PROVIDER_SITE_OTHER): Payer: Medicare Other | Admitting: *Deleted

## 2018-01-21 DIAGNOSIS — E538 Deficiency of other specified B group vitamins: Secondary | ICD-10-CM

## 2018-01-21 MED ORDER — CYANOCOBALAMIN 1000 MCG/ML IJ SOLN
1000.0000 ug | Freq: Once | INTRAMUSCULAR | Status: AC
  Administered 2018-01-21: 1000 ug via INTRAMUSCULAR

## 2018-01-21 NOTE — Progress Notes (Addendum)
Patient presented for B 12 injection to left deltoid, patient voiced no concerns nor showed any signs of distress during injection.  Reviewed.  Dr Scott 

## 2018-01-29 ENCOUNTER — Ambulatory Visit (INDEPENDENT_AMBULATORY_CARE_PROVIDER_SITE_OTHER): Payer: Medicare Other | Admitting: Internal Medicine

## 2018-01-29 ENCOUNTER — Encounter: Payer: Self-pay | Admitting: Internal Medicine

## 2018-01-29 VITALS — BP 130/78 | HR 58 | Temp 97.7°F | Resp 16 | Ht 60.0 in | Wt 164.5 lb

## 2018-01-29 DIAGNOSIS — K754 Autoimmune hepatitis: Secondary | ICD-10-CM

## 2018-01-29 DIAGNOSIS — R945 Abnormal results of liver function studies: Secondary | ICD-10-CM | POA: Diagnosis not present

## 2018-01-29 DIAGNOSIS — Z6832 Body mass index (BMI) 32.0-32.9, adult: Secondary | ICD-10-CM | POA: Diagnosis not present

## 2018-01-29 DIAGNOSIS — Z13220 Encounter for screening for lipoid disorders: Secondary | ICD-10-CM | POA: Diagnosis not present

## 2018-01-29 DIAGNOSIS — D649 Anemia, unspecified: Secondary | ICD-10-CM

## 2018-01-29 DIAGNOSIS — D472 Monoclonal gammopathy: Secondary | ICD-10-CM | POA: Diagnosis not present

## 2018-01-29 DIAGNOSIS — R7989 Other specified abnormal findings of blood chemistry: Secondary | ICD-10-CM

## 2018-01-29 DIAGNOSIS — E538 Deficiency of other specified B group vitamins: Secondary | ICD-10-CM | POA: Diagnosis not present

## 2018-01-29 DIAGNOSIS — E039 Hypothyroidism, unspecified: Secondary | ICD-10-CM | POA: Diagnosis not present

## 2018-01-29 DIAGNOSIS — R739 Hyperglycemia, unspecified: Secondary | ICD-10-CM | POA: Diagnosis not present

## 2018-01-29 DIAGNOSIS — I10 Essential (primary) hypertension: Secondary | ICD-10-CM | POA: Diagnosis not present

## 2018-01-29 LAB — LIPID PANEL
CHOL/HDL RATIO: 3
CHOLESTEROL: 156 mg/dL (ref 0–200)
HDL: 46.7 mg/dL (ref >39.00)
LDL Cholesterol: 92 mg/dL (ref 0–99)
NonHDL: 108.99
TRIGLYCERIDES: 85 mg/dL (ref 0.0–149.0)
VLDL: 17 mg/dL (ref 0.0–40.0)

## 2018-01-29 LAB — BASIC METABOLIC PANEL
BUN: 19 mg/dL (ref 6–23)
CALCIUM: 9.3 mg/dL (ref 8.4–10.5)
CHLORIDE: 107 meq/L (ref 96–112)
CO2: 25 meq/L (ref 19–32)
Creatinine, Ser: 0.91 mg/dL (ref 0.40–1.20)
GFR: 63.01 mL/min (ref >60.00)
GLUCOSE: 103 mg/dL — AB (ref 70–99)
Potassium: 4.4 mEq/L (ref 3.5–5.1)
SODIUM: 139 meq/L (ref 135–145)

## 2018-01-29 LAB — HEPATIC FUNCTION PANEL
ALK PHOS: 40 U/L (ref 39–117)
ALT: 9 U/L (ref 0–35)
AST: 15 U/L (ref 0–37)
Albumin: 4 g/dL (ref 3.5–5.2)
BILIRUBIN DIRECT: 0.1 mg/dL (ref 0.0–0.3)
TOTAL PROTEIN: 7.7 g/dL (ref 6.0–8.3)
Total Bilirubin: 1 mg/dL (ref 0.2–1.2)

## 2018-01-29 LAB — HEMOGLOBIN A1C: Hgb A1c MFr Bld: 5.8 % (ref 4.6–6.5)

## 2018-01-29 NOTE — Progress Notes (Signed)
Pre-visit discussion using our clinic review tool. No additional management support is needed unless otherwise documented below in the visit note.  

## 2018-01-29 NOTE — Progress Notes (Signed)
Patient ID: Lori [name], female   DOB: 1934/10/20, 84 y.o.   MRN: [id number]   Subjective:    Patient ID: Lori [name], female    DOB: [birth year], 84 y.o.   MRN: [id number]  HPI  Patient here for a scheduled follow up.  Had EGD recently.  Swallowing ok.  No acid reflux.  No dysphagia.  No chest pain.  No sob.  Seeing GI.  Has f/u ultrasound planned for 02/2017.  On protonix.  No abdominal pain.  Bowels moving.  Saw cardiology.  Atenolol decreased to 1/2 secondary to bradycardia (heart rate 50s).   States blood pressure has been running a little higher since the change.  States blood pressures now averaging 128-135/upper 50s.     Past Medical History:  Diagnosis Date  . Anemia    iron deficient  . Arthritis   . Autoimmune hepatitis ([place]) 05/10/2016  . B12 deficiency   . Chronic kidney disease   . Diverticulosis   . Fibrocystic breast disease   . GERD (gastroesophageal reflux disease)   . Hypercholesterolemia   . Hypertension   . Hypothyroidism   . Ulcer disease    Past Surgical History:  Procedure Laterality Date  . ESOPHAGOGASTRODUODENOSCOPY (EGD) WITH PROPOFOL N/A 01/30/2016   Procedure: ESOPHAGOGASTRODUODENOSCOPY (EGD) WITH PROPOFOL;  Surgeon: Lollie Sails, MD;  Location: [facility] ENDOSCOPY;  Service: Endoscopy;  Laterality: N/A;  . ESOPHAGOGASTRODUODENOSCOPY (EGD) WITH PROPOFOL N/A 01/14/2018   Procedure: ESOPHAGOGASTRODUODENOSCOPY (EGD) WITH PROPOFOL;  Surgeon: Lollie Sails, MD;  Location: [facility] ENDOSCOPY;  Service: Endoscopy;  Laterality: N/A;  . HERNIA REPAIR    . lap hiatal hernia repair  07/08/08  . LEFT OOPHORECTOMY  1993   benign ovarian cyst  . TUBAL LIGATION  1981   Family History  Problem Relation Age of Onset  . Stroke Mother   . Breast cancer Neg Hx   . Colon cancer Neg Hx    Social History   Socioeconomic History  . Marital status: Widowed    Spouse name: None  . Number of children: 0  . Years of education: None  . Highest education  level: None  Social Needs  . Financial resource strain: None  . Food insecurity - worry: None  . Food insecurity - inability: None  . Transportation needs - medical: None  . Transportation needs - non-medical: None  Occupational History  . None  Tobacco Use  . Smoking status: Never Smoker  . Smokeless tobacco: Never Used  Substance and Sexual Activity  . Alcohol use: No    Alcohol/week: 0.0 oz  . Drug use: No  . Sexual activity: No  Other Topics Concern  . None  Social History Narrative  . None    Outpatient Encounter Medications as of 01/29/2018  Medication Sig  . aspirin 81 MG tablet Take 81 mg by mouth daily.  Marland Kitchen atenolol (TENORMIN) 25 MG tablet TAKE ONE (1) TABLET BY MOUTH EVERY DAY  . benazepril (LOTENSIN) 20 MG tablet TAKE ONE TABLET BY MOUTH EVERY DAY  . calcitonin, salmon, (MIACALCIN/FORTICAL) 200 UNIT/ACT nasal spray USE 1 SPRAY INTO ONE NOSTRIL DAILY ALTERNATE NOSTRILS  . Calcium Citrate (CITRACAL PO) Take 1,200 mg by mouth daily.  . chlorzoxazone (PARAFON) 500 MG tablet TAKE ONE TABLET BY MOUTH AT BEDTIME AS NEEDED  . Cholecalciferol (D3-1000 PO) Take by mouth daily.  . Cyanocobalamin (B-12) 1000 MCG/ML KIT Inject 1,000 Units as directed.  Marland Kitchen GLUCOSAMINE-CHONDROITIN PO Take 2 tablets by mouth daily.  Marland Kitchen levothyroxine (SYNTHROID,  LEVOTHROID) 75 MCG tablet Take 1 tablet (75 mcg total) by mouth daily before breakfast.  . Magnesium Oxide 400 (240 Mg) MG TABS TAKE ONE (1) TABLET BY MOUTH EVERY DAY  . Mesalamine (DELZICOL) 400 MG CPDR Take 400 mg by mouth 3 (three) times daily.  . pantoprazole (PROTONIX) 40 MG tablet Take 80 mg by mouth daily.   . Probiotic Product (ALIGN) 4 MG CAPS Take 1 capsule by mouth daily.  . Wheat Dextrin (BENEFIBER DRINK MIX) PACK Take by mouth.  . zolpidem (AMBIEN CR) 6.25 MG CR tablet TAKE ONE TABLET BY MOUTH AT BEDTIME AS NEEDED.  . [DISCONTINUED] HYDROcodone-acetaminophen (NORCO/VICODIN) 5-325 MG tablet Take 1 tablet by mouth every 6 (six) hours  as needed for moderate pain.   No facility-administered encounter medications on file as of 01/29/2018.     Review of Systems  Constitutional: Negative for appetite change and unexpected weight change.  HENT: Negative for congestion and sinus pressure.   Respiratory: Negative for cough, chest tightness and shortness of breath.   Cardiovascular: Negative for chest pain, palpitations and leg swelling.  Gastrointestinal: Negative for abdominal pain, diarrhea, nausea and vomiting.  Genitourinary: Negative for difficulty urinating and dysuria.  Musculoskeletal: Negative for joint swelling and myalgias.  Skin: Negative for color change and rash.  Neurological: Negative for dizziness, light-headedness and headaches.  Psychiatric/Behavioral: Negative for agitation and dysphoric mood.       Objective:    Physical Exam  Constitutional: She appears well-developed and well-nourished. No distress.  HENT:  Nose: Nose normal.  Mouth/Throat: Oropharynx is clear and moist.  Neck: Neck supple. No thyromegaly present.  Cardiovascular: Normal rate and regular rhythm.  Pulmonary/Chest: Breath sounds normal. No respiratory distress. She has no wheezes.  Abdominal: Soft. Bowel sounds are normal. There is no tenderness.  Musculoskeletal: She exhibits no edema or tenderness.  Lymphadenopathy:    She has no cervical adenopathy.  Skin: No rash noted. No erythema.  Psychiatric: She has a normal mood and affect. Her behavior is normal.    BP 130/78 (BP Location: Left Arm, Patient Position: Sitting, Cuff Size: Large)   Pulse (!) 58   Temp 97.7 F (36.5 C) (Oral)   Resp 16   Ht 5' (1.524 m)   Wt 164 lb 8 oz (74.6 kg)   SpO2 96%   BMI 32.13 kg/m  Wt Readings from Last 3 Encounters:  01/29/18 164 lb 8 oz (74.6 kg)  01/14/18 162 lb (73.5 kg)  09/13/17 164 lb 9.6 oz (74.7 kg)     Lab Results  Component Value Date   WBC 5.2 05/21/2017   HGB 13.1 05/21/2017   HCT 39.2 05/21/2017   PLT 213.0  05/21/2017   GLUCOSE 103 (H) 01/29/2018   CHOL 156 01/29/2018   TRIG 85.0 01/29/2018   HDL 46.70 01/29/2018   LDLCALC 92 01/29/2018   ALT 9 01/29/2018   AST 15 01/29/2018   NA 139 01/29/2018   K 4.4 01/29/2018   CL 107 01/29/2018   CREATININE 0.91 01/29/2018   BUN 19 01/29/2018   CO2 25 01/29/2018   TSH 1.45 05/21/2017   INR 1.03 05/13/2015   HGBA1C 5.8 01/29/2018       Assessment & Plan:   Problem List Items Addressed This Visit    Abnormal liver function tests - Primary    Worked up by GI.  Diagnosed with autoimmune hepatitis.  Has f/u ultrasound planned 02/2018.  Follow liver panel.        Relevant Orders  Hepatic function panel (Completed)   Anemia    Was evaluated by hematology.  Found to be B12 deficient and iron deficient and MGUS.  Follow cbc.        Autoimmune hepatitis ([place])    Followed by GI.  Scheduled for f/u abdominal ultrasound in 02/2018.  Follow liver panel.        B12 deficiency    Continue B12 injections.        BMI 32.0-32.9,adult    Discussed diet and exercise.  Follow.       Hyperglycemia    Low carb diet and exercise.  Follow met b and a1c.       Relevant Orders   Hemoglobin A1c (Completed)   Basic metabolic panel (Completed)   Hypertension    Blood pressure a little higher than previous checks.  Blood pressures as outlined.  Continue same medication regimen.  Follow pressures.  Follow metabolic panel.       Hypothyroidism    On thyroid replacement.  Follow tsh.       MGUS (monoclonal gammopathy of unknown significance)    Followed by hematology.        Other Visit Diagnoses    Screening cholesterol level       Relevant Orders   Lipid panel (Completed)       Einar Pheasant, MD

## 2018-01-30 ENCOUNTER — Encounter: Payer: Self-pay | Admitting: Internal Medicine

## 2018-02-01 ENCOUNTER — Encounter: Payer: Self-pay | Admitting: Internal Medicine

## 2018-02-01 NOTE — Assessment & Plan Note (Signed)
On thyroid replacement.  Follow tsh.  

## 2018-02-01 NOTE — Assessment & Plan Note (Signed)
Discussed diet and exercise.  Follow.  

## 2018-02-01 NOTE — Assessment & Plan Note (Signed)
Worked up by GI.  Diagnosed with autoimmune hepatitis.  Has f/u ultrasound planned 02/2018.  Follow liver panel.   

## 2018-02-01 NOTE — Assessment & Plan Note (Signed)
Followed by hematology 

## 2018-02-01 NOTE — Assessment & Plan Note (Signed)
Low carb diet and exercise.  Follow met b and a1c.  

## 2018-02-01 NOTE — Assessment & Plan Note (Signed)
Continue B12 injections.   

## 2018-02-01 NOTE — Assessment & Plan Note (Signed)
Followed by GI.  Scheduled for f/u abdominal ultrasound in 02/2018.  Follow liver panel.

## 2018-02-01 NOTE — Assessment & Plan Note (Signed)
Was evaluated by hematology.  Found to be B12 deficient and iron deficient and MGUS.  Follow cbc.

## 2018-02-01 NOTE — Assessment & Plan Note (Signed)
Blood pressure a little higher than previous checks.  Blood pressures as outlined.  Continue same medication regimen.  Follow pressures.  Follow metabolic panel.

## 2018-02-21 ENCOUNTER — Other Ambulatory Visit: Payer: Self-pay | Admitting: Internal Medicine

## 2018-02-21 MED ORDER — BENAZEPRIL HCL 20 MG PO TABS: 20.0000 mg | ORAL_TABLET | Freq: Every day | ORAL | 1 refills | Status: DC

## 2018-02-21 NOTE — Telephone Encounter (Signed)
Copied from [place] [id number]. Topic: Quick Communication - Rx Refill/Question >> Feb 21, 2018  1:34 PM Oliver Pila B wrote: Medication:  benazepril (LOTENSIN) 20 MG tablet [[id number]]    Has the patient contacted their pharmacy? Yes.     (Agent: If no, request that the patient contact the pharmacy for the refill.)   Preferred Pharmacy (with phone number or street name): [facility]/ phone: [phone] fax: [phone]   Agent: Please be advised that RX refills may take up to 3 business days. We ask that you follow-up with your pharmacy.

## 2018-02-21 NOTE — Telephone Encounter (Signed)
Copied from [place] [id number]. Topic: Quick Communication - Rx Refill/Question >> Feb 21, 2018  1:34 PM Oliver Pila B wrote: Medication:  benazepril (LOTENSIN) 20 MG tablet [[id number]]    Has the patient contacted their pharmacy? Yes.     (Agent: If no, request that the patient contact the pharmacy for the refill.)   Preferred Pharmacy (with phone number or street name): [facility]/ phone: [phone] fax: [phone]   Agent: Please be advised that RX refills may take up to 3 business days. We ask that you follow-up with your pharmacy.

## 2018-02-25 ENCOUNTER — Ambulatory Visit (INDEPENDENT_AMBULATORY_CARE_PROVIDER_SITE_OTHER): Payer: Medicare Other | Admitting: *Deleted

## 2018-02-25 DIAGNOSIS — E538 Deficiency of other specified B group vitamins: Secondary | ICD-10-CM

## 2018-02-25 MED ORDER — CYANOCOBALAMIN 1000 MCG/ML IJ SOLN
1000.0000 ug | Freq: Once | INTRAMUSCULAR | Status: AC
  Administered 2018-02-25: 1000 ug via INTRAMUSCULAR

## 2018-02-25 NOTE — Progress Notes (Addendum)
Patient presented for B 12 injection to left deltoid, patient voiced no concerns nor showed any signs of distress during injection.  Reviewed.  Dr Scott 

## 2018-02-26 ENCOUNTER — Other Ambulatory Visit: Payer: Self-pay | Admitting: Internal Medicine

## 2018-02-26 NOTE — Telephone Encounter (Signed)
Pt  Called  Verified  That  She  Had  Picked  Up  Her  Supply   Of   Benazepril     At  [facility]    ON    02/21/2018    90  TABS    -   She   Would  Like  All  Future   Rx sent to   [facility] in [place]

## 2018-02-26 NOTE — Telephone Encounter (Signed)
Copied from [place]. Topic: Quick Communication - Rx Refill/Question >> Feb 26, 2018 11:13 AM Oliver Pila B wrote: Medication: benazepril (LOTENSIN) 20 MG tablet [[id number]]    Has the patient contacted their pharmacy? Yes.     (Agent: If no, request that the patient contact the pharmacy for the refill.)   Preferred Pharmacy (with phone number or street name): [facility] in [place]     phone: [phone]     Fax: [phone]          [address], [place]   Agent: Please be advised that RX refills may take up to 3 business days. We ask that you follow-up with your pharmacy.

## 2018-03-12 ENCOUNTER — Ambulatory Visit
Admission: RE | Admit: 2018-03-12 | Discharge: 2018-03-12 | Disposition: A | Discharge status: Home / Self Care | Payer: Medicare Other | Source: Ambulatory Visit | Attending: Gastroenterology | Admitting: Gastroenterology

## 2018-03-12 DIAGNOSIS — K824 Cholesterolosis of gallbladder: Secondary | ICD-10-CM | POA: Diagnosis not present

## 2018-03-12 DIAGNOSIS — K754 Autoimmune hepatitis: Secondary | ICD-10-CM | POA: Diagnosis not present

## 2018-03-12 DIAGNOSIS — K740 Hepatic fibrosis: Secondary | ICD-10-CM | POA: Insufficient documentation

## 2018-03-12 DIAGNOSIS — K7460 Unspecified cirrhosis of liver: Secondary | ICD-10-CM | POA: Diagnosis not present

## 2018-03-19 DIAGNOSIS — K2270 Barrett's esophagus without dysplasia: Secondary | ICD-10-CM | POA: Diagnosis not present

## 2018-03-27 ENCOUNTER — Ambulatory Visit (INDEPENDENT_AMBULATORY_CARE_PROVIDER_SITE_OTHER): Payer: Medicare Other

## 2018-03-27 VITALS — BP 126/72 | HR 56 | Temp 97.9°F | Resp 14 | Ht 60.0 in | Wt 165.8 lb

## 2018-03-27 DIAGNOSIS — E538 Deficiency of other specified B group vitamins: Secondary | ICD-10-CM

## 2018-03-27 DIAGNOSIS — Z0000 Encounter for general adult medical examination without abnormal findings: Secondary | ICD-10-CM | POA: Diagnosis not present

## 2018-03-27 MED ORDER — CYANOCOBALAMIN 1000 MCG/ML IJ SOLN
1000.0000 ug | Freq: Once | INTRAMUSCULAR | Status: AC
  Administered 2018-03-27: 1000 ug via INTRAMUSCULAR

## 2018-03-27 NOTE — Patient Instructions (Addendum)
Lori [name] , Thank you for taking time to come for your Medicare Wellness Visit. I appreciate your ongoing commitment to your health goals. Please review the following plan we discussed and let me know if I can assist you in the future.   These are the goals we discussed: Goals    . Healthy Lifestyle     Walk for exercise Stay hydrated Healthy foods       This is a list of the screening recommended for you and due dates:  Health Maintenance  Topic Date Due  . Tetanus Vaccine  10/01/1955  . Mammogram  05/16/2018  . Flu Shot  Completed  . DEXA scan (bone density measurement)  Completed  . Pneumonia vaccines  Completed

## 2018-03-27 NOTE — Progress Notes (Addendum)
Subjective:   Lori [name] is a 84 y.o. female who presents for Medicare Annual (Subsequent) preventive examination.  Review of Systems:  No ROS.  Medicare Wellness Visit. Additional risk factors are reflected in the social history. Cardiac Risk Factors include: none;advanced age (>52mn, >>21women);hypertension     Objective:     Vitals: BP 126/72 (BP Location: Left Arm, Patient Position: Sitting, Cuff Size: Normal)   Pulse (!) 56   Temp 97.9 F (36.6 C) (Oral)   Resp 14   Ht 5' (1.524 m)   Wt 165 lb 12.8 oz (75.2 kg)   SpO2 95%   BMI 32.38 kg/m   Body mass index is 32.38 kg/m.  Advanced Directives 03/27/2018 01/14/2018 03/26/2017 12/10/2016 11/17/2016 03/26/2016 01/30/2016  Does Patient Have a Medical Advance Directive? Yes Yes Yes Yes No Yes No  Type of Advance Directive Living will;Healthcare Power of [place] Living will;Healthcare [place] of [place] - [place] -  Does patient want to make changes to medical advance directive? No - Patient declined - No - Patient declined - - - -  Copy of [place]? No - copy requested Yes No - copy requested No - copy requested - No - copy requested -  Would patient like information on creating a medical advance directive? - - - - No - patient declined information - No - patient declined information    Tobacco Social History   Tobacco Use  Smoking Status Never Smoker  Smokeless Tobacco Never Used     Counseling given: Not Answered   Clinical Intake:  Pre-visit preparation completed: Yes  Pain : No/denies pain     Nutritional Status: BMI > 30  Obese Diabetes: No  How often do you need to have someone help you when you read instructions, pamphlets, or other written materials from your doctor or pharmacy?: 1 - Never  Interpreter Needed?: No     Past Medical History:    Diagnosis Date  . Anemia    iron deficient  . Arthritis   . Autoimmune hepatitis ([place] 05/10/2016  . B12 deficiency   . Chronic kidney disease   . Diverticulosis   . Fibrocystic breast disease   . GERD (gastroesophageal reflux disease)   . Hypercholesterolemia   . Hypertension   . Hypothyroidism   . Ulcer disease    Past Surgical History:  Procedure Laterality Date  . ESOPHAGOGASTRODUODENOSCOPY (EGD) WITH PROPOFOL N/A 01/30/2016   Procedure: ESOPHAGOGASTRODUODENOSCOPY (EGD) WITH PROPOFOL;  Surgeon: MLollie Sails MD;  [facility];  Service: Endoscopy;  Laterality: N/A;  . ESOPHAGOGASTRODUODENOSCOPY (EGD) WITH PROPOFOL N/A 01/14/2018   Procedure: ESOPHAGOGASTRODUODENOSCOPY (EGD) WITH PROPOFOL;  Surgeon: SLollie Sails MD;  [facility];  Service: Endoscopy;  Laterality: N/A;  . HERNIA REPAIR    . lap hiatal hernia repair  07/08/08  . LEFT OOPHORECTOMY  1993   benign ovarian cyst  . TUBAL LIGATION  1981   Family History  Problem Relation Age of Onset  . Stroke Mother   . Breast cancer Neg Hx   . Colon cancer Neg Hx    Social History   Socioeconomic History  . Marital status: Widowed    Spouse name: Not on file  . Number of children: 0  . Years of education: Not on file  . Highest education level: Not on file  Occupational History  . Not on file  Social Needs  . Financial resource strain: Not hard at all  . Food insecurity:    Worry: Never true    Inability: Never true  . Transportation needs:    Medical: No    Non-medical: No  Tobacco Use  . Smoking status: Never Smoker  . Smokeless tobacco: Never Used  Substance and Sexual Activity  . Alcohol use: No    Alcohol/week: 0.0 oz  . Drug use: No  . Sexual activity: Never  Lifestyle  . Physical activity:    Days per week: Not on file    Minutes per session: Not on file  . Stress: Not at all  Relationships  . Social connections:    Talks on phone: Not on file    Gets together:  Not on file    Attends religious service: Not on file    Active member of club or organization: Not on file    Attends meetings of clubs or organizations: Not on file    Relationship status: Not on file  Other Topics Concern  . Not on file  Social History Narrative  . Not on file    Outpatient Encounter Medications as of 03/27/2018  Medication Sig  . aspirin 81 MG tablet Take 81 mg by mouth daily.  Marland Kitchen atenolol (TENORMIN) 25 MG tablet TAKE ONE (1) TABLET BY MOUTH EVERY DAY (Patient taking differently: TAKE 1/2 TABLET BY MOUTH EVERY DAY)  . benazepril (LOTENSIN) 20 MG tablet Take 1 tablet (20 mg total) by mouth daily.  . calcitonin, salmon, (MIACALCIN/FORTICAL) 200 UNIT/ACT nasal spray USE 1 SPRAY INTO ONE NOSTRIL DAILY ALTERNATE NOSTRILS  . Calcium Citrate (CITRACAL PO) Take 1,200 mg by mouth daily.  . chlorzoxazone (PARAFON) 500 MG tablet TAKE ONE TABLET BY MOUTH AT BEDTIME AS NEEDED  . Cholecalciferol (D3-1000 PO) Take by mouth daily.  . Cyanocobalamin (B-12) 1000 MCG/ML KIT Inject 1,000 Units as directed.  Marland Kitchen GLUCOSAMINE-CHONDROITIN PO Take 2 tablets by mouth daily.  Marland Kitchen levothyroxine (SYNTHROID, LEVOTHROID) 75 MCG tablet Take 1 tablet (75 mcg total) by mouth daily before breakfast.  . Magnesium Oxide 400 (240 Mg) MG TABS TAKE ONE (1) TABLET BY MOUTH EVERY DAY (Patient taking differently: TAKE ONE (1) TABLET BY MOUTH as needed)  . Mesalamine (DELZICOL) 400 MG CPDR Take 400 mg by mouth 3 (three) times daily.  . pantoprazole (PROTONIX) 40 MG tablet Take 80 mg by mouth daily.   . Probiotic Product (ALIGN) 4 MG CAPS Take 1 capsule by mouth daily.  . Wheat Dextrin (BENEFIBER DRINK MIX) PACK Take by mouth.  . zolpidem (AMBIEN CR) 6.25 MG CR tablet TAKE ONE TABLET BY MOUTH AT BEDTIME AS NEEDED.  . [EXPIRED] cyanocobalamin ((VITAMIN B-12)) injection 1,000 mcg    No facility-administered encounter medications on file as of 03/27/2018.     Activities of Daily Living In your present state of  health, do you have any difficulty performing the following activities: 03/27/2018  Hearing? Y  Vision? N  Difficulty concentrating or making decisions? N  Walking or climbing stairs? Y  Comment R knee pulls when climbing stairs. Followed by ortho.   Dressing or bathing? N  Doing errands, shopping? N  Preparing Food and eating ? N  Using the Toilet? N  In the past six months, have you accidently leaked urine? N  Do you have problems with loss of bowel control? N  Managing your Medications? N  Managing your Finances? N  Housekeeping or managing your Housekeeping? N  Some recent data  might be hidden    Patient Care Team: Einar Pheasant, MD as PCP - General (Internal Medicine)    Assessment:   This is a routine wellness examination for Lori [name].  The goal of the wellness visit is to assist the patient how to close the gaps in care and create a preventative care plan for the patient.   The roster of all physicians providing medical care to patient is listed in the Snapshot section of the chart.  Taking calcium VIT D as appropriate/Osteoporosis risk reviewed.    Safety issues reviewed; Smoke and carbon monoxide detectors in the home. No firearms in the home. Wears seatbelts when driving or riding with others. No violence in the home.  They do not have excessive sun exposure.  Discussed the need for sun protection: hats, long sleeves and the use of sunscreen if there is significant sun exposure.  Patient is alert, normal appearance, oriented to person/place/and time.  Correctly identified the president of the [place] and recalls of 3/3 words. Performs simple calculations and can read correct time from watch face. Displays appropriate judgement.  No new identified risk were noted.  No failures at ADL's or IADL's.    BMI- discussed the importance of a healthy diet, water intake and the benefits of aerobic exercise. Educational material provided.   24 hour diet recall: Regular  diet  Dental- every 4 months.  Eye- Visual acuity not assessed per patient preference since they have regular follow up with the ophthalmologist.  Wears corrective lenses.  Sleep patterns- Sleeps 6-8 hours at night.  Wakes feeling rested.  Taking medication as prescribed.    B12 administered R deltoid per physician order.  Tolerated well.  She did not voice any concerns or show any signs of distress.    TDAP vaccine deferred per patient preference.  Follow up with insurance.  Educational material provided.  Patient Concerns: None at this time. Follow up with PCP as needed.  Exercise Activities and Dietary recommendations Current Exercise Habits: Home exercise routine, Type of exercise: walking, Time (Minutes): 20, Frequency (Times/Week): 4, Weekly Exercise (Minutes/Week): 80, Intensity: Mild  Goals    . Healthy Lifestyle     Walk for exercise Stay hydrated Healthy foods       Fall Risk Fall Risk  03/27/2018 03/26/2017 09/10/2016 03/26/2016 09/07/2015  Falls in the past year? No Yes No No No  Number falls in past yr: - 1 - - -  Injury with Fall? - Yes - - -  Comment - Followed by PCP - - -  Follow up - Falls prevention discussed - - -   Depression Screen PHQ 2/9 Scores 03/27/2018 03/26/2017 09/10/2016 03/26/2016  PHQ - 2 Score 0 0 0 0     Cognitive Function MMSE - Mini Mental State Exam 03/26/2017 03/26/2016  Orientation to time 5 5  Orientation to Place 5 5  Registration 3 3  Attention/ Calculation 5 5  Recall 3 3  Language- name 2 objects 2 2  Language- repeat 1 1  Language- follow 3 step command 3 3  Language- read & follow direction 1 1  Write a sentence 1 1  Copy design 1 1  Total score 30 30        Immunization History  Administered Date(s) Administered  . Influenza, High Dose Seasonal PF 10/01/2017  . Influenza,inj,Quad PF,6+ Mos 08/31/2014, 09/07/2015, 08/21/2016  . Pneumococcal Conjugate-13 03/08/2014  . Pneumococcal Polysaccharide-23 07/22/2008  . Zoster  12/31/2008   Screening Tests Health Maintenance  Topic Date Due  . TETANUS/TDAP  10/01/1955  . MAMMOGRAM  05/16/2018  . INFLUENZA VACCINE  Completed  . DEXA SCAN  Completed  . PNA vac Low Risk Adult  Completed      Plan:    End of life planning; Advance aging; Advanced directives discussed. Copy of current HCPOA/Living Will requested.    I have personally reviewed and noted the following in the patient's chart:   . Medical and social history . Use of alcohol, tobacco or illicit drugs  . Current medications and supplements . Functional ability and status . Nutritional status . Physical activity . Advanced directives . List of other physicians . Hospitalizations, surgeries, and ER visits in previous 12 months . Vitals . Screenings to include cognitive, depression, and falls . Referrals and appointments  In addition, I have reviewed and discussed with patient certain preventive protocols, quality metrics, and best practice recommendations. A written personalized care plan for preventive services as well as general preventive health recommendations were provided to patient.     Varney Biles, LPN  5/40/9811   Reviewed above information.  Agree with assessment and plan.    Dr Nicki Reaper

## 2018-04-09 ENCOUNTER — Encounter: Payer: Self-pay | Admitting: Internal Medicine

## 2018-04-14 ENCOUNTER — Other Ambulatory Visit: Payer: Self-pay | Admitting: Internal Medicine

## 2018-04-14 NOTE — Telephone Encounter (Signed)
Script faxed.

## 2018-04-14 NOTE — Telephone Encounter (Signed)
rx signed and placed in box.   

## 2018-04-14 NOTE — Telephone Encounter (Signed)
Last OV 01/29/2018 Next OV 05/01/2018 Last refill 01/02/2018

## 2018-04-29 ENCOUNTER — Ambulatory Visit: Payer: Medicare Other

## 2018-05-01 ENCOUNTER — Encounter: Payer: Self-pay | Admitting: Internal Medicine

## 2018-05-01 ENCOUNTER — Ambulatory Visit (INDEPENDENT_AMBULATORY_CARE_PROVIDER_SITE_OTHER): Payer: Medicare Other | Admitting: Internal Medicine

## 2018-05-01 VITALS — BP 128/78 | HR 64 | Temp 98.2°F | Wt 166.4 lb

## 2018-05-01 DIAGNOSIS — R739 Hyperglycemia, unspecified: Secondary | ICD-10-CM

## 2018-05-01 DIAGNOSIS — K2270 Barrett's esophagus without dysplasia: Secondary | ICD-10-CM

## 2018-05-01 DIAGNOSIS — K209 Esophagitis, unspecified without bleeding: Secondary | ICD-10-CM

## 2018-05-01 DIAGNOSIS — N644 Mastodynia: Secondary | ICD-10-CM | POA: Diagnosis not present

## 2018-05-01 DIAGNOSIS — D649 Anemia, unspecified: Secondary | ICD-10-CM | POA: Diagnosis not present

## 2018-05-01 DIAGNOSIS — Z6832 Body mass index (BMI) 32.0-32.9, adult: Secondary | ICD-10-CM | POA: Diagnosis not present

## 2018-05-01 DIAGNOSIS — K754 Autoimmune hepatitis: Secondary | ICD-10-CM | POA: Diagnosis not present

## 2018-05-01 DIAGNOSIS — E538 Deficiency of other specified B group vitamins: Secondary | ICD-10-CM | POA: Diagnosis not present

## 2018-05-01 DIAGNOSIS — Z1231 Encounter for screening mammogram for malignant neoplasm of breast: Secondary | ICD-10-CM

## 2018-05-01 DIAGNOSIS — I10 Essential (primary) hypertension: Secondary | ICD-10-CM

## 2018-05-01 DIAGNOSIS — E039 Hypothyroidism, unspecified: Secondary | ICD-10-CM

## 2018-05-01 DIAGNOSIS — Z1239 Encounter for other screening for malignant neoplasm of breast: Secondary | ICD-10-CM

## 2018-05-01 DIAGNOSIS — K824 Cholesterolosis of gallbladder: Secondary | ICD-10-CM | POA: Diagnosis not present

## 2018-05-01 MED ORDER — CYANOCOBALAMIN 1000 MCG/ML IJ SOLN
1000.0000 ug | Freq: Once | INTRAMUSCULAR | Status: AC
  Administered 2018-05-01: 1000 ug via INTRAMUSCULAR

## 2018-05-01 NOTE — Progress Notes (Signed)
Patient ID: Lori [name], female   DOB: [birth year], 84 y.o.   MRN: [id number]   Subjective:    Patient ID: Lori [name], female    DOB: 02/12/1934, 84 y.o.   MRN: [id number]  HPI  Patient here for a scheduled follow up.  States she is doing relatively well.  Tries to stay active.  No chest pain.  No sob.  Does report over the last two weeks she has noticed soreness in her left breast - around the nipple.  No nipple discharge.  No rash.  No palpable nodule.  Due mammogram later this month.  No injury.  No acid reflux.  No abdominal pain.  Bowels moving and doing well.  Saw GI 03/19/18.  Planning for f/u EGD - to f/u Barrett's.  Found to have gallbladder polyp on recent ultrasound.  Recommended surgery evaluation.  Pt agreeable.     Past Medical History:  Diagnosis Date  . Anemia    iron deficient  . Arthritis   . Autoimmune hepatitis ([place]) 05/10/2016  . B12 deficiency   . Chronic kidney disease   . Diverticulosis   . Fibrocystic breast disease   . GERD (gastroesophageal reflux disease)   . Hypercholesterolemia   . Hypertension   . Hypothyroidism   . Ulcer disease    Past Surgical History:  Procedure Laterality Date  . ESOPHAGOGASTRODUODENOSCOPY (EGD) WITH PROPOFOL N/A 01/30/2016   Procedure: ESOPHAGOGASTRODUODENOSCOPY (EGD) WITH PROPOFOL;  Surgeon: Lollie Sails, MD;  Location: [facility] ENDOSCOPY;  Service: Endoscopy;  Laterality: N/A;  . ESOPHAGOGASTRODUODENOSCOPY (EGD) WITH PROPOFOL N/A 01/14/2018   Procedure: ESOPHAGOGASTRODUODENOSCOPY (EGD) WITH PROPOFOL;  Surgeon: Lollie Sails, MD;  Location: [facility] ENDOSCOPY;  Service: Endoscopy;  Laterality: N/A;  . HERNIA REPAIR    . lap hiatal hernia repair  07/08/08  . LEFT OOPHORECTOMY  1993   benign ovarian cyst  . TUBAL LIGATION  1981   Family History  Problem Relation Age of Onset  . Stroke Mother   . Breast cancer Neg Hx   . Colon cancer Neg Hx    Social History   Socioeconomic History  . Marital status:  Widowed    Spouse name: Not on file  . Number of children: 0  . Years of education: Not on file  . Highest education level: Not on file  Occupational History  . Not on file  Social Needs  . Financial resource strain: Not hard at all  . Food insecurity:    Worry: Never true    Inability: Never true  . Transportation needs:    Medical: No    Non-medical: No  Tobacco Use  . Smoking status: Never Smoker  . Smokeless tobacco: Never Used  Substance and Sexual Activity  . Alcohol use: No    Alcohol/week: 0.0 oz  . Drug use: No  . Sexual activity: Never  Lifestyle  . Physical activity:    Days per week: Not on file    Minutes per session: Not on file  . Stress: Not at all  Relationships  . Social connections:    Talks on phone: Not on file    Gets together: Not on file    Attends religious service: Not on file    Active member of club or organization: Not on file    Attends meetings of clubs or organizations: Not on file    Relationship status: Not on file  Other Topics Concern  . Not on file  Social History Narrative  . Not  on file    Outpatient Encounter Medications as of 05/01/2018  Medication Sig  . aspirin 81 MG tablet Take 81 mg by mouth daily.  Marland Kitchen atenolol (TENORMIN) 25 MG tablet TAKE ONE (1) TABLET BY MOUTH EVERY DAY (Patient taking differently: No sig reported)  . benazepril (LOTENSIN) 20 MG tablet Take 1 tablet (20 mg total) by mouth daily.  . calcitonin, salmon, (MIACALCIN/FORTICAL) 200 UNIT/ACT nasal spray USE 1 SPRAY INTO ONE NOSTRIL DAILY ALTERNATE NOSTRILS  . Calcium Citrate (CITRACAL PO) Take 1,200 mg by mouth daily.  . chlorzoxazone (PARAFON) 500 MG tablet TAKE ONE TABLET BY MOUTH AT BEDTIME AS NEEDED  . Cholecalciferol (D3-1000 PO) Take by mouth daily.  . Cyanocobalamin (B-12) 1000 MCG/ML KIT Inject 1,000 Units as directed.  Marland Kitchen GLUCOSAMINE-CHONDROITIN PO Take 2 tablets by mouth daily.  Marland Kitchen levothyroxine (SYNTHROID, LEVOTHROID) 75 MCG tablet Take 1 tablet (75  mcg total) by mouth daily before breakfast.  . Magnesium Oxide 400 (240 Mg) MG TABS TAKE ONE (1) TABLET BY MOUTH EVERY DAY (Patient taking differently: TAKE ONE (1) TABLET BY MOUTH as needed)  . Mesalamine (DELZICOL) 400 MG CPDR Take 400 mg by mouth 3 (three) times daily.  . pantoprazole (PROTONIX) 40 MG tablet Take 80 mg by mouth daily.   . Probiotic Product (ALIGN) 4 MG CAPS Take 1 capsule by mouth daily.  . Wheat Dextrin (BENEFIBER DRINK MIX) PACK Take by mouth.  . zolpidem (AMBIEN CR) 6.25 MG CR tablet TAKE ONE TABLET AT BED- TIME IF NEEDED FOR SLEEP.  . [EXPIRED] cyanocobalamin ((VITAMIN B-12)) injection 1,000 mcg    No facility-administered encounter medications on file as of 05/01/2018.     Review of Systems  Constitutional: Negative for appetite change and unexpected weight change.  HENT: Negative for congestion and sinus pressure.   Respiratory: Negative for cough, chest tightness and shortness of breath.   Cardiovascular: Negative for chest pain, palpitations and leg swelling.  Gastrointestinal: Negative for abdominal pain, diarrhea, nausea and vomiting.  Genitourinary: Negative for difficulty urinating and dysuria.  Musculoskeletal: Negative for joint swelling and myalgias.  Skin: Negative for color change and rash.  Neurological: Negative for dizziness, light-headedness and headaches.  Psychiatric/Behavioral: Negative for agitation and dysphoric mood.       Objective:    Physical Exam  Constitutional: She appears well-developed and well-nourished. No distress.  HENT:  Nose: Nose normal.  Mouth/Throat: Oropharynx is clear and moist.  Neck: Neck supple. No thyromegaly present.  Cardiovascular: Normal rate and regular rhythm.  Pulmonary/Chest: Breath sounds normal. No respiratory distress. She has no wheezes.  Breasts:  No nipple discharge or nipple retraction present.  Could not appreciate distinct nodules or axillary adenopathy.    Abdominal: Soft. Bowel sounds are  normal. There is no tenderness.  Musculoskeletal: She exhibits no edema or tenderness.  Lymphadenopathy:    She has no cervical adenopathy.  Skin: No rash noted. No erythema.  Psychiatric: She has a normal mood and affect. Her behavior is normal.    BP 128/78 (BP Location: Left Arm, Patient Position: Sitting, Cuff Size: Large)   Pulse 64   Temp 98.2 F (36.8 C) (Oral)   Wt 166 lb 6 oz (75.5 kg)   SpO2 96%   BMI 32.49 kg/m  Wt Readings from Last 3 Encounters:  05/01/18 166 lb 6 oz (75.5 kg)  03/27/18 165 lb 12.8 oz (75.2 kg)  01/29/18 164 lb 8 oz (74.6 kg)     Lab Results  Component Value Date  WBC 5.2 05/21/2017   HGB 13.1 05/21/2017   HCT 39.2 05/21/2017   PLT 213.0 05/21/2017   GLUCOSE 103 (H) 01/29/2018   CHOL 156 01/29/2018   TRIG 85.0 01/29/2018   HDL 46.70 01/29/2018   LDLCALC 92 01/29/2018   ALT 9 01/29/2018   AST 15 01/29/2018   NA 139 01/29/2018   K 4.4 01/29/2018   CL 107 01/29/2018   CREATININE 0.91 01/29/2018   BUN 19 01/29/2018   CO2 25 01/29/2018   TSH 1.45 05/21/2017   INR 1.03 05/13/2015   HGBA1C 5.8 01/29/2018    US Abdomen Complete W/elastography  Result Date: 03/12/2018 CLINICAL DATA:  Hepatic cirrhosis without ascites. EXAM: ULTRASOUND ABDOMEN ULTRASOUND HEPATIC ELASTOGRAPHY TECHNIQUE: Sonography of the upper abdomen was performed. In addition, ultrasound elastography evaluation of the liver was performed. A region of interest was placed within the right lobe of the liver. Following application of a compressive sonographic pulse, shear waves were detected in the adjacent hepatic tissue and the shear wave velocity was calculated. Multiple assessments were performed at the selected site. Median shear wave velocity is correlated to a Metavir fibrosis score. COMPARISON:  03/28/2017 and 03/01/2016 FINDINGS: ULTRASOUND ABDOMEN Gallbladder: No gallstones identified. A polypoid echogenic soft tissue density is seen in the gallbladder fundus which is  non-mobile. This measures 2.2 by 1.9 x 1.0 cm, without significant change compared to previous studies. This is consistent with a gallbladder polyp. No evidence of diffuse gallbladder wall thickening or pericholecystic fluid. No sonographic Murphy sign noted by sonographer. Common bile duct: Diameter: 5 mm, within normal limits. Liver: No focal lesion identified. Within normal limits in parenchymal echogenicity. Portal vein is patent on color Doppler imaging with normal direction of blood flow towards the liver. IVC: No abnormality visualized. Pancreas: Visualized portion unremarkable. Spleen: Size and appearance within normal limits. Right Kidney: Length: 9.5 cm. Echogenicity within normal limits. No mass or hydronephrosis visualized. Left Kidney: Length: 10.3 cm. Echogenicity within normal limits. No mass or hydronephrosis visualized. Abdominal aorta: No aneurysm visualized. Other findings: None. ULTRASOUND HEPATIC ELASTOGRAPHY Device: Siemens Helix VTQ Patient position: Supine Transducer 4V1 Number of measurements: 10 Hepatic segment:  8 Median velocity:   0.87 m/sec IQR: 0.10 IQR/Median velocity ratio: 0.11 Corresponding Metavir fibrosis score:  F0/F1 Risk of fibrosis: Minimal Limitations of exam: None Please note that abnormal shear wave velocities may also be identified in clinical settings other than with hepatic fibrosis, such as: acute hepatitis, elevated right heart and central venous pressures including use of beta blockers, veno-occlusive disease (Budd-Chiari), infiltrative processes such as mastocytosis/amyloidosis/infiltrative tumor, extrahepatic cholestasis, in the post-prandial state, and liver transplantation. Correlation with patient history, laboratory data, and clinical condition recommended. IMPRESSION: ULTRASOUND ABDOMEN: 2 cm gallbladder polyp, without significant change compared to prior exams. Differential diagnosis includes adenoma and carcinoma. Surgical consultation is recommended. This  recommendation follows ACR consensus guidelines: White Paper of the ACR Incidental Findings Committee II on Gallbladder and Biliary Findings. J Am Coll Radiol 2013:;10:953-956. Otherwise negative abdomen ultrasound. ULTRASOUND HEPATIC ELASTOGRAPHY: Median hepatic shear wave velocity is calculated at 0.87 m/sec. Corresponding Metavir fibrosis score is F0/F1. Risk of fibrosis is Minimal. Follow-up: None required Electronically Signed   By: Earle Gell M.D.   On: 03/12/2018 12:26       Assessment & Plan:   Problem List Items Addressed This Visit    Anemia    Previously evaluated by hematology.  Found to be iron deficient and B12 deficient and MGUS.  Follow cbc.  Relevant Medications   cyanocobalamin ((VITAMIN B-12)) injection 1,000 mcg (Completed)   Other Relevant Orders   CBC with Differential/Platelet   Ferritin   Autoimmune hepatitis ([place])    Has been followed by GI.  Liver function tests have been normal recently.  Ultrasound as outlined.  Refer to surgery for evaluation - gallbladder polyp.        Relevant Orders   Hepatic function panel   B12 deficiency   Relevant Medications   cyanocobalamin ((VITAMIN B-12)) injection 1,000 mcg (Completed)   Barrett's esophagus with esophagitis    Scheduled for f/u EGD.        BMI 32.0-32.9,adult    Diet and exercise.  Follow.        Breast tenderness    Pain as outlined.  Exam as outlined.  Due screening.  Will schedule diagnostic mammogram with possible ultrasound.        Gallbladder polyp    Found on recent ultrasound.  Recommend surgery evaluation.  Refer to Dr Bary Castilla.        Relevant Orders   Ambulatory referral to General Surgery   Hyperglycemia    Low carb diet and exercise.  Follow met b and a1c.        Relevant Orders   Hemoglobin A1c   Hypertension    Blood pressure under good control.  Continue same medication regimen.  Follow pressures.  Follow metabolic panel.        Relevant Orders   Lipid panel   Basic  metabolic panel   Hypothyroidism    On thyroid replacement.  Follow tsh.        Relevant Orders   TSH    Other Visit Diagnoses    Breast cancer screening    -  Primary   Relevant Orders   MM DIAG BREAST TOMO BILATERAL   US BREAST LTD UNI LEFT INC AXILLA   Nipple tenderness       Relevant Orders   MM DIAG BREAST TOMO BILATERAL   US BREAST LTD UNI LEFT INC AXILLA       Einar Pheasant, MD

## 2018-05-07 ENCOUNTER — Encounter: Payer: Self-pay | Admitting: *Deleted

## 2018-05-07 ENCOUNTER — Encounter: Payer: Self-pay | Admitting: Internal Medicine

## 2018-05-07 DIAGNOSIS — N644 Mastodynia: Secondary | ICD-10-CM | POA: Insufficient documentation

## 2018-05-07 DIAGNOSIS — R001 Bradycardia, unspecified: Secondary | ICD-10-CM | POA: Diagnosis not present

## 2018-05-07 DIAGNOSIS — I10 Essential (primary) hypertension: Secondary | ICD-10-CM | POA: Diagnosis not present

## 2018-05-07 DIAGNOSIS — I491 Atrial premature depolarization: Secondary | ICD-10-CM | POA: Diagnosis not present

## 2018-05-07 DIAGNOSIS — K824 Cholesterolosis of gallbladder: Secondary | ICD-10-CM | POA: Insufficient documentation

## 2018-05-07 NOTE — Assessment & Plan Note (Signed)
Pain as outlined.  Exam as outlined.  Due screening.  Will schedule diagnostic mammogram with possible ultrasound.

## 2018-05-07 NOTE — Assessment & Plan Note (Signed)
Found on recent ultrasound.  Recommend surgery evaluation.  Refer to Dr Bary Castilla.

## 2018-05-07 NOTE — Assessment & Plan Note (Signed)
On thyroid replacement.  Follow tsh.  

## 2018-05-07 NOTE — Assessment & Plan Note (Signed)
Previously evaluated by hematology.  Found to be iron deficient and B12 deficient and MGUS.  Follow cbc.  

## 2018-05-07 NOTE — Assessment & Plan Note (Signed)
Low carb diet and exercise.  Follow met b and a1c.   

## 2018-05-07 NOTE — Assessment & Plan Note (Signed)
Scheduled for f/u EGD

## 2018-05-07 NOTE — Assessment & Plan Note (Signed)
Diet and exercise.  Follow.  

## 2018-05-07 NOTE — Assessment & Plan Note (Signed)
Has been followed by GI.  Liver function tests have been normal recently.  Ultrasound as outlined.  Refer to surgery for evaluation - gallbladder polyp.

## 2018-05-07 NOTE — Assessment & Plan Note (Signed)
Blood pressure under good control.  Continue same medication regimen.  Follow pressures.  Follow metabolic panel.   

## 2018-05-19 ENCOUNTER — Encounter: Payer: Self-pay | Admitting: *Deleted

## 2018-05-20 ENCOUNTER — Ambulatory Visit: Payer: Medicare Other | Admitting: Registered Nurse

## 2018-05-20 ENCOUNTER — Ambulatory Visit
Admission: RE | Admit: 2018-05-20 | Discharge: 2018-05-20 | Disposition: A | Discharge status: Home / Self Care | Payer: Medicare Other | Source: Ambulatory Visit | Attending: Gastroenterology | Admitting: Gastroenterology

## 2018-05-20 ENCOUNTER — Encounter
Admission: RE | Disposition: A | Discharge status: Home / Self Care | Payer: Self-pay | Source: Ambulatory Visit | Attending: Gastroenterology

## 2018-05-20 ENCOUNTER — Encounter: Payer: Self-pay | Admitting: *Deleted

## 2018-05-20 DIAGNOSIS — I8500 Esophageal varices without bleeding: Secondary | ICD-10-CM | POA: Insufficient documentation

## 2018-05-20 DIAGNOSIS — Z888 Allergy status to other drugs, medicaments and biological substances status: Secondary | ICD-10-CM | POA: Diagnosis not present

## 2018-05-20 DIAGNOSIS — K2270 Barrett's esophagus without dysplasia: Secondary | ICD-10-CM | POA: Diagnosis not present

## 2018-05-20 DIAGNOSIS — E7800 Pure hypercholesterolemia, unspecified: Secondary | ICD-10-CM | POA: Insufficient documentation

## 2018-05-20 DIAGNOSIS — I129 Hypertensive chronic kidney disease with stage 1 through stage 4 chronic kidney disease, or unspecified chronic kidney disease: Secondary | ICD-10-CM | POA: Insufficient documentation

## 2018-05-20 DIAGNOSIS — Z7982 Long term (current) use of aspirin: Secondary | ICD-10-CM | POA: Diagnosis not present

## 2018-05-20 DIAGNOSIS — N189 Chronic kidney disease, unspecified: Secondary | ICD-10-CM | POA: Diagnosis not present

## 2018-05-20 DIAGNOSIS — K228 Other specified diseases of esophagus: Secondary | ICD-10-CM | POA: Insufficient documentation

## 2018-05-20 DIAGNOSIS — K449 Diaphragmatic hernia without obstruction or gangrene: Secondary | ICD-10-CM | POA: Diagnosis not present

## 2018-05-20 DIAGNOSIS — E039 Hypothyroidism, unspecified: Secondary | ICD-10-CM | POA: Insufficient documentation

## 2018-05-20 DIAGNOSIS — Z79899 Other long term (current) drug therapy: Secondary | ICD-10-CM | POA: Insufficient documentation

## 2018-05-20 DIAGNOSIS — Z7951 Long term (current) use of inhaled steroids: Secondary | ICD-10-CM | POA: Insufficient documentation

## 2018-05-20 DIAGNOSIS — Z79890 Hormone replacement therapy (postmenopausal): Secondary | ICD-10-CM | POA: Diagnosis not present

## 2018-05-20 DIAGNOSIS — K3184 Gastroparesis: Secondary | ICD-10-CM | POA: Diagnosis not present

## 2018-05-20 DIAGNOSIS — Z880 Allergy status to penicillin: Secondary | ICD-10-CM | POA: Insufficient documentation

## 2018-05-20 DIAGNOSIS — K279 Peptic ulcer, site unspecified, unspecified as acute or chronic, without hemorrhage or perforation: Secondary | ICD-10-CM | POA: Insufficient documentation

## 2018-05-20 DIAGNOSIS — M1990 Unspecified osteoarthritis, unspecified site: Secondary | ICD-10-CM | POA: Insufficient documentation

## 2018-05-20 DIAGNOSIS — E538 Deficiency of other specified B group vitamins: Secondary | ICD-10-CM | POA: Diagnosis not present

## 2018-05-20 DIAGNOSIS — K219 Gastro-esophageal reflux disease without esophagitis: Secondary | ICD-10-CM | POA: Insufficient documentation

## 2018-05-20 DIAGNOSIS — K754 Autoimmune hepatitis: Secondary | ICD-10-CM | POA: Insufficient documentation

## 2018-05-20 HISTORY — PX: ESOPHAGOGASTRODUODENOSCOPY (EGD) WITH PROPOFOL: SHX5813

## 2018-05-20 HISTORY — DX: Bradycardia, unspecified: R00.1

## 2018-05-20 SURGERY — ESOPHAGOGASTRODUODENOSCOPY (EGD) WITH PROPOFOL
Anesthesia: General

## 2018-05-20 MED ORDER — PROPOFOL 500 MG/50ML IV EMUL
INTRAVENOUS | Status: AC
  Filled 2018-05-20: qty 50

## 2018-05-20 MED ORDER — GLYCOPYRROLATE 0.2 MG/ML IJ SOLN
INTRAMUSCULAR | Status: AC
  Filled 2018-05-20: qty 1

## 2018-05-20 MED ORDER — PROPOFOL 10 MG/ML IV BOLUS
INTRAVENOUS | Status: DC | PRN
  Administered 2018-05-20: 80 mg via INTRAVENOUS

## 2018-05-20 MED ORDER — SODIUM CHLORIDE 0.9 % IV SOLN
INTRAVENOUS | Status: DC
  Administered 2018-05-20: 09:00:00 via INTRAVENOUS

## 2018-05-20 MED ORDER — LIDOCAINE HCL (PF) 2 % IJ SOLN
INTRAMUSCULAR | Status: AC
  Filled 2018-05-20: qty 10

## 2018-05-20 MED ORDER — LIDOCAINE HCL (CARDIAC) PF 100 MG/5ML IV SOSY
PREFILLED_SYRINGE | INTRAVENOUS | Status: DC | PRN
  Administered 2018-05-20: 80 mg via INTRAVENOUS

## 2018-05-20 MED ORDER — PHENYLEPHRINE HCL 10 MG/ML IJ SOLN
INTRAMUSCULAR | Status: DC | PRN
  Administered 2018-05-20 (×2): 100 ug via INTRAVENOUS

## 2018-05-20 MED ORDER — PROPOFOL 500 MG/50ML IV EMUL
INTRAVENOUS | Status: DC | PRN
  Administered 2018-05-20: 185 ug/kg/min via INTRAVENOUS

## 2018-05-20 NOTE — Transfer of Care (Signed)
Immediate Anesthesia Transfer of Care Note  Patient: Lori [name]  Procedure(s) Performed: ESOPHAGOGASTRODUODENOSCOPY (EGD) WITH PROPOFOL (N/A )  Patient Location: PACU  Anesthesia Type:General  Level of Consciousness: awake  Airway & Oxygen Therapy: Patient Spontanous Breathing  Post-op Assessment: Report given to RN  Post vital signs: stable  Last Vitals:  Vitals Value Taken Time  BP 132/72 05/20/2018  9:51 AM  Temp 36.1 C 05/20/2018  9:48 AM  Pulse 66 05/20/2018  9:53 AM  Resp 19 05/20/2018  9:53 AM  SpO2 100 % 05/20/2018  9:53 AM  Vitals shown include unvalidated device data.  Last Pain:  Vitals:   05/20/18 0948  TempSrc: Tympanic  PainSc: 0-No pain         Complications: No apparent anesthesia complications

## 2018-05-20 NOTE — Anesthesia Post-op Follow-up Note (Signed)
Anesthesia QCDR form completed.        

## 2018-05-20 NOTE — H&P (Signed)
Outpatient short stay form Pre-procedure 05/20/2018 9:03 AM Lollie Sails MD  Primary Physician: Einar Pheasant, MD  Reason for visit: EGD  History of present illness: Patient is a 84 year old female sending today for EGD in regards her history of long segment Barrett's esophagus.  It is of note that she had an EGD several months ago however multiple varices these in relation to her history of autoimmune hepatitis and biopsy consideration was more difficult.  Is currently off consulted with Dr Lorelee Market at [facility] in regard to technique and will proceed with EGD and brushings of areas of concern is an acceptable alternative.  Patient has held her daily aspirin.  She takes no other blood thinners or aspirin products.    Current Facility-Administered Medications:  .  0.9 %  sodium chloride infusion, , Intravenous, Continuous, Lollie Sails, MD .  0.9 %  sodium chloride infusion, , Intravenous, Continuous, Lollie Sails, MD, Last Rate: 20 mL/hr at 05/20/18 0901  Medications Prior to Admission  Medication Sig Dispense Refill Last Dose  . atenolol (TENORMIN) 25 MG tablet TAKE ONE (1) TABLET BY MOUTH EVERY DAY (Patient taking differently: No sig reported) 90 tablet 1 05/20/2018 at Unknown time  . benazepril (LOTENSIN) 20 MG tablet Take 1 tablet (20 mg total) by mouth daily. 90 tablet 1 05/20/2018 at Unknown time  . calcitonin, salmon, (MIACALCIN/FORTICAL) 200 UNIT/ACT nasal spray USE 1 SPRAY INTO ONE NOSTRIL DAILY ALTERNATE NOSTRILS 3.7 mL 5 05/19/2018 at Unknown time  . Calcium Citrate (CITRACAL PO) Take 1,200 mg by mouth daily.   05/19/2018 at Unknown time  . chlorzoxazone (PARAFON) 500 MG tablet TAKE ONE TABLET BY MOUTH AT BEDTIME AS NEEDED 30 tablet 0 05/19/2018 at Unknown time  . Cholecalciferol (D3-1000 PO) Take by mouth daily.   05/19/2018 at Unknown time  . Cyanocobalamin (B-12) 1000 MCG/ML KIT Inject 1,000 Units as directed.   Past Month at Unknown time  . fluticasone (FLONASE) 50  MCG/ACT nasal spray Place into both nostrils daily.   Past Month at Unknown time  . GLUCOSAMINE-CHONDROITIN PO Take 2 tablets by mouth daily.   05/19/2018 at Unknown time  . levothyroxine (SYNTHROID, LEVOTHROID) 75 MCG tablet Take 1 tablet (75 mcg total) by mouth daily before breakfast. 90 tablet 1 05/19/2018 at Unknown time  . Mesalamine (DELZICOL) 400 MG CPDR Take 400 mg by mouth 3 (three) times daily.   05/19/2018 at Unknown time  . pantoprazole (PROTONIX) 40 MG tablet Take 80 mg by mouth daily.    05/19/2018 at Unknown time  . aspirin 81 MG tablet Take 81 mg by mouth daily.   05/15/2018  . Magnesium Oxide 400 (240 Mg) MG TABS TAKE ONE (1) TABLET BY MOUTH EVERY DAY (Patient taking differently: TAKE ONE (1) TABLET BY MOUTH as needed) 30 tablet 1 05/15/2018  . Probiotic Product (ALIGN) 4 MG CAPS Take 1 capsule by mouth daily.   Not Taking at Unknown time  . Wheat Dextrin (BENEFIBER DRINK MIX) PACK Take by mouth.   Taking  . zolpidem (AMBIEN CR) 6.25 MG CR tablet TAKE ONE TABLET AT BED- TIME IF NEEDED FOR SLEEP. 30 tablet 1 05/15/2018     Allergies  Allergen Reactions  . Penicillins   . Daypro [Oxaprozin] Rash  . Lodine [Etodolac] Rash     Past Medical History:  Diagnosis Date  . Anemia    iron deficient  . Arthritis   . Autoimmune hepatitis ([place]) 05/10/2016  . B12 deficiency   . Bradycardia   .  Chronic kidney disease   . Diverticulosis   . Fibrocystic breast disease   . GERD (gastroesophageal reflux disease)   . Hypercholesterolemia   . Hypertension   . Hypothyroidism   . Ulcer disease     Review of systems:      Physical Exam    Heart and lungs: Regular rate and rhythm without rub or gallop, lungs are bilaterally clear.    HEENT: normaCephalic atraumatic eyes are anicteric    Other:    Pertinant exam for procedure: Soft nontender nondistended bowel sounds positive normoactive.    Planned proceedures: Colonoscopy and indicated procedures. I have discussed the risks  benefits and complications of procedures to include not limited to bleeding, infection, perforation and the risk of sedation and the patient wishes to proceed.    Lollie Sails, MD Gastroenterology 05/20/2018  9:03 AM

## 2018-05-20 NOTE — Anesthesia Preprocedure Evaluation (Signed)
Anesthesia Evaluation  Patient identified by MRN, date of birth, ID band Patient awake    Reviewed: Allergy & Precautions, H&P , NPO status , Patient's Chart, lab work & pertinent test results, reviewed documented beta blocker date and time   History of Anesthesia Complications Negative for: history of anesthetic complications  Airway Mallampati: II  TM Distance: >3 FB Neck ROM: full    Dental   Pulmonary neg pulmonary ROS,           Cardiovascular Exercise Tolerance: Good hypertension, (-) angina(-) CAD, (-) Past MI, (-) Cardiac Stents and (-) CABG (-) dysrhythmias (-) Valvular Problems/Murmurs     Neuro/Psych negative neurological ROS  negative psych ROS   GI/Hepatic GERD  ,(+) Hepatitis -, C  Endo/Other  neg diabetesHypothyroidism   Renal/GU CRFRenal disease  negative genitourinary   Musculoskeletal   Abdominal   Peds  Hematology  (+) Blood dyscrasia, anemia ,   Anesthesia Other Findings Past Medical History: No date: Anemia     Comment:  iron deficient No date: Arthritis 05/10/2016: Autoimmune hepatitis ([place]) No date: B12 deficiency No date: Chronic kidney disease No date: Diverticulosis No date: Fibrocystic breast disease No date: GERD (gastroesophageal reflux disease) No date: Hypercholesterolemia No date: Hypertension No date: Hypothyroidism No date: Ulcer disease   Reproductive/Obstetrics negative OB ROS                             Anesthesia Physical  Anesthesia Plan  ASA: III  Anesthesia Plan: General   Post-op Pain Management:    Induction: Intravenous  PONV Risk Score and Plan: 3 and Propofol infusion  Airway Management Planned: Nasal Cannula  Additional Equipment:   Intra-op Plan:   Post-operative Plan:   Informed Consent: I have reviewed the patients History and Physical, chart, labs and discussed the procedure including the risks, benefits and  alternatives for the proposed anesthesia with the patient or authorized representative who has indicated his/her understanding and acceptance.   Dental Advisory Given  Plan Discussed with: Anesthesiologist, CRNA and Surgeon  Anesthesia Plan Comments:         Anesthesia Quick Evaluation

## 2018-05-20 NOTE — Anesthesia Postprocedure Evaluation (Signed)
Anesthesia Post Note  Patient: Shanley Furlough  Procedure(s) Performed: ESOPHAGOGASTRODUODENOSCOPY (EGD) WITH PROPOFOL (N/A )  Patient location during evaluation: Endoscopy Anesthesia Type: General Level of consciousness: awake and alert Pain management: pain level controlled Vital Signs Assessment: post-procedure vital signs reviewed and stable Respiratory status: spontaneous breathing, nonlabored ventilation, respiratory function stable and patient connected to nasal cannula oxygen Cardiovascular status: blood pressure returned to baseline and stable Postop Assessment: no apparent nausea or vomiting Anesthetic complications: no     Last Vitals:  Vitals:   05/20/18 1010 05/20/18 1020  BP: (!) 119/52 (!) 122/58  Pulse: 60 (!) 58  Resp: (!) 28 15  Temp:    SpO2: 97% 94%    Last Pain:  Vitals:   05/20/18 0953  TempSrc:   PainSc: 0-No pain                 Martha Clan

## 2018-05-20 NOTE — Op Note (Signed)
[facility] Gastroenterology Patient Name: Lori [name] Procedure Date: 05/20/2018 9:05 AM MRN: [id number] Account #: [id number] Date of Birth: Jun 08, 1934 Admit Type: Outpatient Age: 84 Room: [facility] ENDO ROOM 3 Gender: Female Note Status: Finalized Procedure:            Upper GI endoscopy Indications:          Surveillance procedure, Follow-up of Barrett's esophagus Providers:            Lollie Sails, MD Referring MD:         Einar Pheasant, MD (Referring MD) Medicines:            Monitored Anesthesia Care Complications:        No immediate complications. Procedure:            Pre-Anesthesia Assessment:                       - ASA Grade Assessment: II - A patient with mild                        systemic disease.                       After obtaining informed consent, the endoscope was                        passed under direct vision. Throughout the procedure,                        the patient's blood pressure, pulse, and oxygen                        saturations were monitored continuously. The Endoscope                        was introduced through the mouth, and advanced to the                        prepyloric region, stomach. The upper GI endoscopy was                        accomplished without difficulty. The patient tolerated                        the procedure well. Findings:      Evidence of Barretts esophagus noted from about 24 to 33 cm from the       incisors. The scope was introduced further to the prepyloric area. Ther       was a small amount of food residue noted in the mid stomach and moreso       in the antrum, as noted on previous procedures. I did not introduce into       the duodenum or retroflex due to concern for aspiration. The scope was       returned to the esophagus, and brushings were obtained in the following       ranges-33-32, 31-30, 29-28, 27-26, 25-24 cm from the incisors, and       placed into separate jars labeled as  such. There was no other       abnormality noted.      A medium-sized hiatal hernia was present.  Grade I, small (< 5 mm) varices were found in the lower third of the       esophagus, multiple but deep to the mucosa. Impression:           - Medium-sized hiatal hernia.                       - Grade I and small (< 5 mm) esophageal varices.                       - No specimens collected.                       - Esophageal mucosal changes secondary to established                        long-segment Barrett's disease. Recommendation:       - Discharge patient to home.                       - Await pathology results.                       - Return to GI clinic in 1 month. Procedure Code(s):    --- Professional ---                       [id number], 87, Esophagogastroduodenoscopy, flexible,                        transoral; diagnostic, including collection of                        specimen(s) by brushing or washing, when performed                        (separate procedure) Diagnosis Code(s):    --- Professional ---                       K44.9, Diaphragmatic hernia without obstruction or                        gangrene                       I85.00, Esophageal varices without bleeding                       K22.70, Barrett's esophagus without dysplasia CPT copyright 2017 American Medical Association. All rights reserved. The codes documented in this report are preliminary and upon coder review may  be revised to meet current compliance requirements. Lollie Sails, MD 05/20/2018 9:58:16 AM This report has been signed electronically. Number of Addenda: 0 Note Initiated On: 05/20/2018 9:05 AM      [facility]

## 2018-05-21 LAB — CYTOLOGY - NON PAP

## 2018-05-22 ENCOUNTER — Ambulatory Visit
Admission: RE | Admit: 2018-05-22 | Discharge: 2018-05-22 | Disposition: A | Discharge status: Home / Self Care | Payer: Medicare Other | Source: Ambulatory Visit | Attending: Internal Medicine | Admitting: Internal Medicine

## 2018-05-22 ENCOUNTER — Encounter: Payer: Self-pay | Admitting: Gastroenterology

## 2018-05-22 DIAGNOSIS — Z1231 Encounter for screening mammogram for malignant neoplasm of breast: Secondary | ICD-10-CM | POA: Insufficient documentation

## 2018-05-22 DIAGNOSIS — N644 Mastodynia: Secondary | ICD-10-CM

## 2018-05-22 DIAGNOSIS — N6489 Other specified disorders of breast: Secondary | ICD-10-CM | POA: Diagnosis not present

## 2018-05-22 DIAGNOSIS — R922 Inconclusive mammogram: Secondary | ICD-10-CM | POA: Diagnosis not present

## 2018-05-22 DIAGNOSIS — Z1239 Encounter for other screening for malignant neoplasm of breast: Secondary | ICD-10-CM

## 2018-05-22 LAB — CYTOLOGY - NON PAP

## 2018-06-03 ENCOUNTER — Ambulatory Visit: Payer: Medicare Other

## 2018-06-04 ENCOUNTER — Other Ambulatory Visit (INDEPENDENT_AMBULATORY_CARE_PROVIDER_SITE_OTHER): Payer: Medicare Other

## 2018-06-04 ENCOUNTER — Ambulatory Visit (INDEPENDENT_AMBULATORY_CARE_PROVIDER_SITE_OTHER): Payer: Medicare Other | Admitting: *Deleted

## 2018-06-04 DIAGNOSIS — R739 Hyperglycemia, unspecified: Secondary | ICD-10-CM | POA: Diagnosis not present

## 2018-06-04 DIAGNOSIS — I10 Essential (primary) hypertension: Secondary | ICD-10-CM | POA: Diagnosis not present

## 2018-06-04 DIAGNOSIS — E039 Hypothyroidism, unspecified: Secondary | ICD-10-CM | POA: Diagnosis not present

## 2018-06-04 DIAGNOSIS — K754 Autoimmune hepatitis: Secondary | ICD-10-CM | POA: Diagnosis not present

## 2018-06-04 DIAGNOSIS — D649 Anemia, unspecified: Secondary | ICD-10-CM | POA: Diagnosis not present

## 2018-06-04 DIAGNOSIS — E538 Deficiency of other specified B group vitamins: Secondary | ICD-10-CM

## 2018-06-04 LAB — CBC WITH DIFFERENTIAL/PLATELET
BASOS PCT: 1 % (ref 0.0–3.0)
Basophils Absolute: 0 10*3/uL (ref 0.0–0.1)
EOS ABS: 0.1 10*3/uL (ref 0.0–0.7)
EOS PCT: 2.1 % (ref 0.0–5.0)
HCT: 36.9 % (ref 36.0–46.0)
Hemoglobin: 12.3 g/dL (ref 12.0–15.0)
LYMPHS PCT: 24.5 % (ref 12.0–46.0)
Lymphs Abs: 1 10*3/uL (ref 0.7–4.0)
MCHC: 33.5 g/dL (ref 30.0–36.0)
MCV: 89.4 fl (ref 78.0–100.0)
MONOS PCT: 8.6 % (ref 3.0–12.0)
Monocytes Absolute: 0.3 10*3/uL (ref 0.1–1.0)
NEUTROS ABS: 2.5 10*3/uL (ref 1.4–7.7)
Neutrophils Relative %: 63.8 % (ref 43.0–77.0)
PLATELETS: 195 10*3/uL (ref 150.0–400.0)
RBC: 4.12 Mil/uL (ref 3.87–5.11)
RDW: 14.3 % (ref 11.5–15.5)
WBC: 4 10*3/uL (ref 4.0–10.5)

## 2018-06-04 LAB — HEPATIC FUNCTION PANEL
ALT: 9 U/L (ref 0–35)
AST: 14 U/L (ref 0–37)
Albumin: 4 g/dL (ref 3.5–5.2)
Alkaline Phosphatase: 40 U/L (ref 39–117)
BILIRUBIN DIRECT: 0.1 mg/dL (ref 0.0–0.3)
TOTAL PROTEIN: 7.4 g/dL (ref 6.0–8.3)
Total Bilirubin: 0.9 mg/dL (ref 0.2–1.2)

## 2018-06-04 LAB — BASIC METABOLIC PANEL
BUN: 26 mg/dL — AB (ref 6–23)
CALCIUM: 9.6 mg/dL (ref 8.4–10.5)
CO2: 27 mEq/L (ref 19–32)
CREATININE: 0.97 mg/dL (ref 0.40–1.20)
Chloride: 106 mEq/L (ref 96–112)
GFR: 58.48 mL/min — AB (ref >60.00)
Glucose, Bld: 101 mg/dL — ABNORMAL HIGH (ref 70–99)
POTASSIUM: 4.2 meq/L (ref 3.5–5.1)
Sodium: 140 mEq/L (ref 135–145)

## 2018-06-04 LAB — LIPID PANEL
CHOL/HDL RATIO: 4
CHOLESTEROL: 172 mg/dL (ref 0–200)
HDL: 44.8 mg/dL (ref >39.00)
LDL Cholesterol: 103 mg/dL — ABNORMAL HIGH (ref 0–99)
NonHDL: 127.46
TRIGLYCERIDES: 123 mg/dL (ref 0.0–149.0)
VLDL: 24.6 mg/dL (ref 0.0–40.0)

## 2018-06-04 LAB — TSH: TSH: 4.46 u[IU]/mL (ref 0.35–4.50)

## 2018-06-04 LAB — HEMOGLOBIN A1C: Hgb A1c MFr Bld: 5.9 % (ref 4.6–6.5)

## 2018-06-04 LAB — FERRITIN: Ferritin: 42.5 ng/mL (ref 10.0–291.0)

## 2018-06-04 MED ORDER — CYANOCOBALAMIN 1000 MCG/ML IJ SOLN
1000.0000 ug | Freq: Once | INTRAMUSCULAR | Status: AC
  Administered 2018-06-04: 1000 ug via INTRAMUSCULAR

## 2018-06-04 NOTE — Progress Notes (Signed)
Patient presented for B 12 injection to right deltoid, patient voiced no concerns nor showed any signs of distress during injection. 

## 2018-06-10 ENCOUNTER — Encounter: Payer: Self-pay | Admitting: General Surgery

## 2018-06-10 ENCOUNTER — Ambulatory Visit (INDEPENDENT_AMBULATORY_CARE_PROVIDER_SITE_OTHER): Payer: Medicare Other | Admitting: General Surgery

## 2018-06-10 VITALS — BP 142/82 | HR 65 | Resp 12 | Ht 60.0 in | Wt 167.0 lb

## 2018-06-10 DIAGNOSIS — K824 Cholesterolosis of gallbladder: Secondary | ICD-10-CM | POA: Diagnosis not present

## 2018-06-10 NOTE — Patient Instructions (Addendum)
The patient is aware to call back for any questions or concerns.  Laparoscopic Cholecystectomy Laparoscopic cholecystectomy is surgery to remove the gallbladder. The gallbladder is a pear-shaped organ that lies beneath the liver on the right side of the body. The gallbladder stores bile, which is a fluid that helps the body to digest fats. Cholecystectomy is often done for inflammation of the gallbladder (cholecystitis). This condition is usually caused by a buildup of gallstones (cholelithiasis) in the gallbladder. Gallstones can block the flow of bile, which can result in inflammation and pain. In severe cases, emergency surgery may be required. This procedure is done though small incisions in your abdomen (laparoscopic surgery). A thin scope with a camera (laparoscope) is inserted through one incision. Thin surgical instruments are inserted through the other incisions. In some cases, a laparoscopic procedure may be turned into a type of surgery that is done through a larger incision (open surgery). Tell a health care provider about:  Any allergies you have.  All medicines you are taking, including vitamins, herbs, eye drops, creams, and over-the-counter medicines.  Any problems you or family members have had with anesthetic medicines.  Any blood disorders you have.  Any surgeries you have had.  Any medical conditions you have.  Whether you are pregnant or may be pregnant. What are the risks? Generally, this is a safe procedure. However, problems may occur, including:  Infection.  Bleeding.  Allergic reactions to medicines.  Damage to other structures or organs.  A stone remaining in the common bile duct. The common bile duct carries bile from the gallbladder into the small intestine.  A bile leak from the cyst duct that is clipped when your gallbladder is removed.  What happens before the procedure? Staying hydrated Follow instructions from your health care provider about  hydration, which may include:  Up to 2 hours before the procedure - you may continue to drink clear liquids, such as water, clear fruit juice, black coffee, and plain tea.  Eating and drinking restrictions Follow instructions from your health care provider about eating and drinking, which may include:  8 hours before the procedure - stop eating heavy meals or foods such as meat, fried foods, or fatty foods.  6 hours before the procedure - stop eating light meals or foods, such as toast or cereal.  6 hours before the procedure - stop drinking milk or drinks that contain milk.  2 hours before the procedure - stop drinking clear liquids.  Medicines  Ask your health care provider about: ? Changing or stopping your regular medicines. This is especially important if you are taking diabetes medicines or blood thinners. ? Taking medicines such as aspirin and ibuprofen. These medicines can thin your blood. Do not take these medicines before your procedure if your health care provider instructs you not to.  You may be given antibiotic medicine to help prevent infection. General instructions  Let your health care provider know if you develop a cold or an infection before surgery.  Plan to have someone take you home from the hospital or clinic.  Ask your health care provider how your surgical site will be marked or identified. What happens during the procedure?  To reduce your risk of infection: ? Your health care team will wash or sanitize their hands. ? Your skin will be washed with soap. ? Hair may be removed from the surgical area.  An IV tube may be inserted into one of your veins.  You will be given one  or more of the following: ? A medicine to help you relax (sedative). ? A medicine to make you fall asleep (general anesthetic).  A breathing tube will be placed in your mouth.  Your surgeon will make several small cuts (incisions) in your abdomen.  The laparoscope will be  inserted through one of the small incisions. The camera on the laparoscope will send images to a TV screen (monitor) in the operating room. This lets your surgeon see inside your abdomen.  Air-like gas will be pumped into your abdomen. This will expand your abdomen to give the surgeon more room to perform the surgery.  Other tools that are needed for the procedure will be inserted through the other incisions. The gallbladder will be removed through one of the incisions.  Your common bile duct may be examined. If stones are found in the common bile duct, they may be removed.  After your gallbladder has been removed, the incisions will be closed with stitches (sutures), staples, or skin glue.  Your incisions may be covered with a bandage (dressing). The procedure may vary among health care providers and hospitals. What happens after the procedure?  Your blood pressure, heart rate, breathing rate, and blood oxygen level will be monitored until the medicines you were given have worn off.  You will be given medicines as needed to control your pain.  Do not drive for 24 hours if you were given a sedative. This information is not intended to replace advice given to you by your health care provider. Make sure you discuss any questions you have with your health care provider. Document Released: 12/17/2005 Document Revised: 07/08/2016 Document Reviewed: 06/04/2016 Elsevier Interactive Patient Education  [employer].  The patient is scheduled for surgery at [facility] on 06/25/18. She will pre admit at the Hospital on 06/16/17 at 11:15 am. The patient is aware of date and instructions.

## 2018-06-10 NOTE — Progress Notes (Signed)
Patient ID: Lori [name], female   DOB: [birth year], 84 y.o.   MRN: [id number]  Chief Complaint  Patient presents with  . Other    HPI Lori [name] is a 84 y.o. female.  Here for evaluation of gall bladder polyp referred by Dr Nicki Reaper found by ultrasound ordered by Dr Gustavo Lah. He follows her Hepatitis C as well. She states she sees him for diarrhea and history of gastroparesis. Denies any abdominal pain. Last episode of diarrhea was this morning. Abdominal ultrasound was 03-12-18. Upper endoscopy was 05-20-18.  HPI  Past Medical History:  Diagnosis Date  . Anemia    iron deficient  . Arthritis   . Autoimmune hepatitis ([place]) 05/10/2016   Hep C  . B12 deficiency   . Barrett esophagus 01/2016  . Bradycardia   . Chronic kidney disease   . Diverticulosis   . Fibrocystic breast disease   . GERD (gastroesophageal reflux disease)   . Hypercholesterolemia   . Hypertension   . Hypothyroidism   . Ulcer disease     Past Surgical History:  Procedure Laterality Date  . ESOPHAGOGASTRODUODENOSCOPY (EGD) WITH PROPOFOL N/A 01/30/2016   Procedure: ESOPHAGOGASTRODUODENOSCOPY (EGD) WITH PROPOFOL;  Surgeon: Lollie Sails, MD;  Location: [facility] ENDOSCOPY;  Service: Endoscopy;  Laterality: N/A;  . ESOPHAGOGASTRODUODENOSCOPY (EGD) WITH PROPOFOL N/A 01/14/2018   Procedure: ESOPHAGOGASTRODUODENOSCOPY (EGD) WITH PROPOFOL;  Surgeon: Lollie Sails, MD;  Location: [facility] ENDOSCOPY;  Service: Endoscopy;  Laterality: N/A;  . ESOPHAGOGASTRODUODENOSCOPY (EGD) WITH PROPOFOL N/A 05/20/2018   Procedure: ESOPHAGOGASTRODUODENOSCOPY (EGD) WITH PROPOFOL;  Surgeon: Lollie Sails, MD;  Location: [facility] ENDOSCOPY;  Service: Endoscopy;  Laterality: N/A;  . HERNIA REPAIR    . lap hiatal hernia repair  07/08/08  . LEFT OOPHORECTOMY  1993   benign ovarian cyst  . TUBAL LIGATION  1981    Family History  Problem Relation Age of Onset  . Stroke Mother   . Breast cancer Neg Hx   . Colon cancer Neg Hx      Social History Social History   Tobacco Use  . Smoking status: Never Smoker  . Smokeless tobacco: Never Used  Substance Use Topics  . Alcohol use: No    Alcohol/week: 0.0 oz  . Drug use: No    Allergies  Allergen Reactions  . Penicillins Other (See Comments)    Dizziness and passed out Has patient had a PCN reaction causing immediate rash, facial/tongue/throat swelling, SOB or lightheadedness with hypotension: No Has patient had a PCN reaction causing severe rash involving mucus membranes or skin necrosis: No Has patient had a PCN reaction that required hospitalization: No Has patient had a PCN reaction occurring within the last 10 years: No  If all of the above answers are "NO", then may proceed with Cephalosporin use.   Lori [name] [Oxaprozin] Rash  . Lodine [Etodolac] Rash    Current Outpatient Medications  Medication Sig Dispense Refill  . aspirin 81 MG tablet Take 81 mg by mouth daily.    Marland Kitchen atenolol (TENORMIN) 25 MG tablet TAKE ONE (1) TABLET BY MOUTH EVERY DAY (Patient taking differently: Take 12.5 mg by mouth daily) 90 tablet 1  . benazepril (LOTENSIN) 20 MG tablet Take 1 tablet (20 mg total) by mouth daily. 90 tablet 1  . calcitonin, salmon, (MIACALCIN/FORTICAL) 200 UNIT/ACT nasal spray USE 1 SPRAY INTO ONE NOSTRIL DAILY ALTERNATE NOSTRILS 3.7 mL 5  . chlorzoxazone (PARAFON) 500 MG tablet TAKE ONE TABLET BY MOUTH AT BEDTIME AS NEEDED (Patient taking differently:  Take 500 mg by mouth at bedtime as needed for muscle spasms. ) 30 tablet 0  . Cholecalciferol (D3-1000 PO) Take 1,000 Units by mouth daily.     . Cyanocobalamin (B-12) 1000 MCG/ML KIT Inject 1,000 mcg as directed every 30 (thirty) days.     Marland Kitchen GLUCOSAMINE-CHONDROITIN PO Take 1 tablet by mouth 2 (two) times daily.     Marland Kitchen levothyroxine (SYNTHROID, LEVOTHROID) 75 MCG tablet Take 1 tablet (75 mcg total) by mouth daily before breakfast. 90 tablet 1  . Magnesium Oxide 400 (240 Mg) MG TABS TAKE ONE (1) TABLET BY MOUTH  EVERY DAY (Patient taking differently: Take 400 mg by mouth daily as needed for cramps) 30 tablet 1  . Mesalamine (DELZICOL) 400 MG CPDR Take 400 mg by mouth 3 (three) times daily.    . pantoprazole (PROTONIX) 40 MG tablet Take 40 mg by mouth 2 (two) times daily.     . Wheat Dextrin (BENEFIBER DRINK MIX) PACK Take 1 each by mouth daily.     Marland Kitchen zolpidem (AMBIEN CR) 6.25 MG CR tablet TAKE ONE TABLET AT BED- TIME IF NEEDED FOR SLEEP. 30 tablet 1  . Calcium-Phosphorus-Vitamin D (CITRACAL +D3 PO) Take 1 tablet by mouth daily.    Lori [name] Glycol-Propyl Glycol (SYSTANE OP) Take 1 drop by mouth daily as needed (for dry eyes).     No current facility-administered medications for this visit.     Review of Systems Review of Systems  Constitutional: Negative.   Respiratory: Negative.   Cardiovascular: Negative.   Gastrointestinal: Positive for diarrhea. Negative for abdominal pain, constipation, nausea and vomiting.    Blood pressure (!) 142/82, pulse 65, resp. rate 12, height 5' (1.524 m), weight 167 lb (75.8 kg), SpO2 95 %.  Physical Exam Physical Exam  Constitutional: She is oriented to person, place, and time. She appears well-developed and well-nourished.  HENT:  Mouth/Throat: Oropharynx is clear and moist. No oropharyngeal exudate.  Eyes: Conjunctivae are normal. No scleral icterus.  Neck: Neck supple.  Cardiovascular: Normal rate, regular rhythm, normal heart sounds and intact distal pulses.  No lower leg edema  Pulmonary/Chest: Effort normal and breath sounds normal.  Abdominal: Soft. Normal appearance and bowel sounds are normal. There is no tenderness.  Lymphadenopathy:    She has no cervical adenopathy.  Neurological: She is alert and oriented to person, place, and time.  Skin: Skin is warm and dry.  Psychiatric: Her behavior is normal.    Data Reviewed March 12, 2018 abdominal ultrasound, partial report:  Gallbladder: No gallstones identified. A polypoid echogenic  soft tissue density is seen in the gallbladder fundus which is non-mobile. This measures 2.2 by 1.9 x 1.0 cm, without significant change compared to previous studies. This is consistent with a gallbladder polyp. No evidence of diffuse gallbladder wall thickening or pericholecystic fluid. No sonographic Murphy sign noted by sonographer. Common bile duct: Diameter: 5 mm, within normal limits.  March 01, 2016 abdominal ultrasound, partial report:   Gallbladder: Gallbladder wall is normal in thickness, 2.4 mm. No sonographic Murphy's sign. Mobile sludge is identified within the gallbladder, forming a sludge ball which measures 2.8 x 0.6 x 1.5 cm. No definite stones.  Common bile duct: Diameter: 7.9 mm  December 09, 2014 abdominal ultrasound: Mobile echogenic ovoid focus without posterior acoustic shadowing is noted within the gallbladder lumen. Small, probable sludge ball layering within the gallbladder lumen.  No evidence of cholelithiasis or cholecystitis. Common bile duct: 7 mm.  May 13, 2015 liver biopsy results:  DIAGNOSIS:  A. LIVER; ULTRASOUND GUIDED BIOPSY:  - FRAGMENTED CORES OF HEPATIC PARENCHYMA WITH MILD-MODERATE HEPATITIC  PROCESS, SEE COMMENT.  - NEGATIVE FOR MALIGNANCY.     Comment:  Sections demonstrate fragmented cores of hepatic parenchyma with mild to  moderate interface hepatitis (piecemeal necrosis), rare apoptotic  hepatocytes, and scattered ceroid-containing Kupffer cells and portal  histiocytes. The majority of the portal tracts contain a moderately  dense lymphoplasmacytic infiltrate with scattered eosinophils. Steatosis  and cholestasis are not identified. Bile ducts are unremarkable.  Definite portal fibrosis is not identified however this is challenging  to assess in a fragmented specimen. Periductal fibrosis is not  identified. There is no stainable iron. PASD stain highlights  ceroid-containing Kupffer cells and portal histiocytes. Intrahepatocyte   globules are not identified. Stain controls worked appropriately.  Given the laboratory results in conjunction with the microscopic  findings the differential diagnosis includes autoimmune hepatitis and  autoimmune hepatitis-like drug reaction.   June 04, 2018 laboratory studies: Hepatic function panel  was normal.  CBC showed a hemoglobin of 12.3 with an MCV of 89, white blood cell count of 4000 with a platelet count of 195,000.  Assessment    Gallbladder polyp versus sludge.    Plan    If indeed this is a polyp, size warrants excision due to the potential for malignancy.  The patient appears to be in general good health, lives independently, does her own shopping although she has given up yard maintenance.  It seems reasonable to offer her cholecystectomy.  We will contact her GI physician to determine if repeat liver biopsy would be appropriate in an otherwise asymptomatic patient not on therapy.  Laparoscopic Cholecystectomy with Intraoperative Cholangiogram. The procedure, including it's potential risks and complications (including but not limited to infection, bleeding, injury to intra-abdominal organs or bile ducts, bile leak, poor cosmetic result, sepsis and death) were discussed with the patient in detail. Non-operative options, including their inherent risks (acute calculous cholecystitis with possible choledocholithiasis or gallstone pancreatitis, with the risk of ascending cholangitis, sepsis, and death) were discussed as well. The patient expressed and understanding of what we discussed and wishes to proceed with laparoscopic cholecystectomy. The patient further understands that if it is technically not possible, or it is unsafe to proceed laparoscopically, that I will convert to an open cholecystectomy.    HPI, Physical Exam, Assessment and Plan have been scribed under the direction and in the presence of Lori Bellow, MD. Lori Fetch, RN  I have completed the exam  and reviewed the above documentation for accuracy and completeness.  I agree with the above.  Haematologist has been used and any errors in dictation or transcription are unintentional.  Lori [name], M.D., F.A.C.S.  The patient is scheduled for surgery at [facility] on 06/25/18. She will pre admit at the Hospital on 06/16/17 at 11:15 am. The patient is aware of date and instructions.  Documented by Lori Otis Brace LPN  Lori [name] 06/11/2018, 5:34 PM

## 2018-06-11 ENCOUNTER — Other Ambulatory Visit: Payer: Self-pay | Admitting: General Surgery

## 2018-06-11 DIAGNOSIS — K824 Cholesterolosis of gallbladder: Secondary | ICD-10-CM

## 2018-06-16 ENCOUNTER — Other Ambulatory Visit: Payer: Self-pay | Admitting: Internal Medicine

## 2018-06-16 ENCOUNTER — Other Ambulatory Visit: Payer: Self-pay

## 2018-06-16 ENCOUNTER — Encounter
Admission: RE | Admit: 2018-06-16 | Discharge: 2018-06-16 | Disposition: A | Discharge status: Home / Self Care | Payer: Medicare Other | Source: Ambulatory Visit | Attending: General Surgery | Admitting: General Surgery

## 2018-06-16 DIAGNOSIS — R001 Bradycardia, unspecified: Secondary | ICD-10-CM | POA: Diagnosis not present

## 2018-06-16 DIAGNOSIS — Z01810 Encounter for preprocedural cardiovascular examination: Secondary | ICD-10-CM | POA: Insufficient documentation

## 2018-06-16 DIAGNOSIS — I10 Essential (primary) hypertension: Secondary | ICD-10-CM | POA: Insufficient documentation

## 2018-06-16 DIAGNOSIS — Z01812 Encounter for preprocedural laboratory examination: Secondary | ICD-10-CM | POA: Diagnosis not present

## 2018-06-16 LAB — PROTIME-INR
INR: 0.89
Prothrombin Time: 12 seconds (ref 11.4–15.2)

## 2018-06-16 NOTE — Patient Instructions (Signed)
Your procedure is scheduled on: 06/25/18 Wed Report to Same Day Surgery 2nd floor medical mall [facility] Entrance-take elevator on left to 2nd floor.  Check in with surgery information desk.) To find out your arrival time please call [phone] between 1PM - 3PM on 06/24/18 Tues  Remember: Instructions that are not followed completely may result in serious medical risk, up to and including death, or upon the discretion of your surgeon and anesthesiologist your surgery may need to be rescheduled.    _x___ 1. Do not eat food after midnight the night before your procedure. You may drink clear liquids up to 2 hours before you are scheduled to arrive at the hospital for your procedure.  Do not drink clear liquids within 2 hours of your scheduled arrival to the hospital.  Clear liquids include  --Water or Apple juice without pulp  --Clear carbohydrate beverage such as ClearFast or Gatorade  --Black Coffee or Clear Tea (No milk, no creamers, do not add anything to                  the coffee or Tea Type 1 and type 2 diabetics should only drink water.  No gum chewing or hard candies.     __x__ 2. No Alcohol for 24 hours before or after surgery.   __x__3. No Smoking or e-cigarettes for 24 prior to surgery.  Do not use any chewable tobacco products for at least 6 hour prior to surgery   ____  4. Bring all medications with you on the day of surgery if instructed.    __x__ 5. Notify your doctor if there is any change in your medical condition     (cold, fever, infections).    x___6. On the morning of surgery brush your teeth with toothpaste and water.  You may rinse your mouth with mouth wash if you wish.  Do not swallow any toothpaste or mouthwash.   Do not wear jewelry, make-up, hairpins, clips or nail polish.  Do not wear lotions, powders, or perfumes. You may wear deodorant.  Do not shave 48 hours prior to surgery. Men may shave face and neck.  Do not bring valuables to the hospital.     [facility] is not responsible for any belongings or valuables.               Contacts, dentures or bridgework may not be worn into surgery.  Leave your suitcase in the car. After surgery it may be brought to your room.  For patients admitted to the hospital, discharge time is determined by your                       treatment team.  _  Patients discharged the day of surgery will not be allowed to drive home.  You will need someone to drive you home and stay with you the night of your procedure.    Please read over the following fact sheets that you were given:   [facility] Preparing for Surgery and or MRSA Information   _x___ Take anti-hypertensive listed below, cardiac, seizure, asthma,     anti-reflux and psychiatric medicines. These include:  1. atenolol (TENORMIN) 25 MG tablet  2.levothyroxine (SYNTHROID, LEVOTHROID) 75 MCG tablet  3.Mesalamine (DELZICOL) 400 MG CPDR  4.pantoprazole (PROTONIX) 40 MG tablet  5.  6.  ____Fleets enema or Magnesium Citrate as directed.   _x___ Use CHG Soap or sage wipes as directed on instruction sheet  ____ Use inhalers on the day of surgery and bring to hospital day of surgery  ____ Stop Metformin and Janumet 2 days prior to surgery.    ____ Take 1/2 of usual insulin dose the night before surgery and none on the morning     surgery.   _x___ Follow recommendations from Cardiologist, Pulmonologist or PCP regarding          stopping Aspirin, Coumadin, Plavix ,Eliquis, Effient, or Pradaxa, and Pletal.  X____Stop Anti-inflammatories such as Advil, Aleve, Ibuprofen, Motrin, Naproxen, Naprosyn, Goodies powders or aspirin products. OK to take Tylenol and                          Celebrex.   _x___ Stop supplements until after surgery.  But may continue Vitamin D, Vitamin B,       and multivitamin.   ____ Bring C-Pap to the hospital.

## 2018-06-17 NOTE — Pre-Procedure Instructions (Signed)
EKG COMPARED TO 2009

## 2018-06-25 ENCOUNTER — Ambulatory Visit: Payer: Medicare Other | Admitting: Anesthesiology

## 2018-06-25 ENCOUNTER — Other Ambulatory Visit: Payer: Self-pay

## 2018-06-25 ENCOUNTER — Ambulatory Visit
Admission: RE | Admit: 2018-06-25 | Discharge: 2018-06-25 | Disposition: A | Discharge status: Home / Self Care | Payer: Medicare Other | Source: Ambulatory Visit | Attending: General Surgery | Admitting: General Surgery

## 2018-06-25 ENCOUNTER — Encounter
Admission: RE | Disposition: A | Discharge status: Home / Self Care | Payer: Self-pay | Source: Ambulatory Visit | Attending: General Surgery

## 2018-06-25 ENCOUNTER — Encounter: Payer: Self-pay | Admitting: *Deleted

## 2018-06-25 ENCOUNTER — Ambulatory Visit: Payer: Medicare Other

## 2018-06-25 DIAGNOSIS — K828 Other specified diseases of gallbladder: Secondary | ICD-10-CM | POA: Diagnosis not present

## 2018-06-25 DIAGNOSIS — E7800 Pure hypercholesterolemia, unspecified: Secondary | ICD-10-CM | POA: Diagnosis not present

## 2018-06-25 DIAGNOSIS — K219 Gastro-esophageal reflux disease without esophagitis: Secondary | ICD-10-CM | POA: Diagnosis not present

## 2018-06-25 DIAGNOSIS — E039 Hypothyroidism, unspecified: Secondary | ICD-10-CM | POA: Diagnosis not present

## 2018-06-25 DIAGNOSIS — K754 Autoimmune hepatitis: Secondary | ICD-10-CM | POA: Insufficient documentation

## 2018-06-25 DIAGNOSIS — K824 Cholesterolosis of gallbladder: Secondary | ICD-10-CM | POA: Diagnosis not present

## 2018-06-25 DIAGNOSIS — I10 Essential (primary) hypertension: Secondary | ICD-10-CM | POA: Diagnosis not present

## 2018-06-25 DIAGNOSIS — E538 Deficiency of other specified B group vitamins: Secondary | ICD-10-CM | POA: Insufficient documentation

## 2018-06-25 DIAGNOSIS — Z419 Encounter for procedure for purposes other than remedying health state, unspecified: Secondary | ICD-10-CM

## 2018-06-25 DIAGNOSIS — M1990 Unspecified osteoarthritis, unspecified site: Secondary | ICD-10-CM | POA: Diagnosis not present

## 2018-06-25 DIAGNOSIS — K812 Acute cholecystitis with chronic cholecystitis: Secondary | ICD-10-CM | POA: Diagnosis not present

## 2018-06-25 DIAGNOSIS — I499 Cardiac arrhythmia, unspecified: Secondary | ICD-10-CM | POA: Diagnosis not present

## 2018-06-25 HISTORY — PX: CHOLECYSTECTOMY: SHX55

## 2018-06-25 SURGERY — LAPAROSCOPIC CHOLECYSTECTOMY WITH INTRAOPERATIVE CHOLANGIOGRAM
Anesthesia: General | Wound class: Clean Contaminated

## 2018-06-25 MED ORDER — FENTANYL CITRATE (PF) 100 MCG/2ML IJ SOLN
INTRAMUSCULAR | Status: AC
  Filled 2018-06-25: qty 2

## 2018-06-25 MED ORDER — FENTANYL CITRATE (PF) 100 MCG/2ML IJ SOLN
INTRAMUSCULAR | Status: DC | PRN
  Administered 2018-06-25: 25 ug via INTRAVENOUS
  Administered 2018-06-25: 50 ug via INTRAVENOUS
  Administered 2018-06-25: 25 ug via INTRAVENOUS

## 2018-06-25 MED ORDER — SUGAMMADEX SODIUM 200 MG/2ML IV SOLN
INTRAVENOUS | Status: AC
  Filled 2018-06-25: qty 2

## 2018-06-25 MED ORDER — ACETAMINOPHEN 10 MG/ML IV SOLN
INTRAVENOUS | Status: AC
  Filled 2018-06-25: qty 100

## 2018-06-25 MED ORDER — PROMETHAZINE HCL 25 MG/ML IJ SOLN: 6.2500 mg | INTRAMUSCULAR | Status: DC | PRN

## 2018-06-25 MED ORDER — ROCURONIUM BROMIDE 50 MG/5ML IV SOLN
INTRAVENOUS | Status: AC
  Filled 2018-06-25: qty 1

## 2018-06-25 MED ORDER — LIDOCAINE HCL (PF) 2 % IJ SOLN
INTRAMUSCULAR | Status: AC
  Filled 2018-06-25: qty 10

## 2018-06-25 MED ORDER — EPHEDRINE SULFATE 50 MG/ML IJ SOLN
INTRAMUSCULAR | Status: AC
  Filled 2018-06-25: qty 1

## 2018-06-25 MED ORDER — FENTANYL CITRATE (PF) 100 MCG/2ML IJ SOLN
25.0000 ug | INTRAMUSCULAR | Status: DC | PRN
  Administered 2018-06-25 (×4): 25 ug via INTRAVENOUS

## 2018-06-25 MED ORDER — FENTANYL CITRATE (PF) 100 MCG/2ML IJ SOLN
INTRAMUSCULAR | Status: AC
  Administered 2018-06-25: 25 ug via INTRAVENOUS
  Filled 2018-06-25: qty 2

## 2018-06-25 MED ORDER — HYDROCODONE-ACETAMINOPHEN 7.5-325 MG PO TABS: 1.0000 | ORAL_TABLET | Freq: Once | ORAL | Status: DC | PRN

## 2018-06-25 MED ORDER — CEFAZOLIN SODIUM-DEXTROSE 2-4 GM/100ML-% IV SOLN
INTRAVENOUS | Status: AC
  Filled 2018-06-25: qty 100

## 2018-06-25 MED ORDER — ACETAMINOPHEN 325 MG PO TABS: 325.0000 mg | ORAL_TABLET | ORAL | Status: DC | PRN

## 2018-06-25 MED ORDER — LIDOCAINE HCL (CARDIAC) PF 100 MG/5ML IV SOSY
PREFILLED_SYRINGE | INTRAVENOUS | Status: DC | PRN
  Administered 2018-06-25: 80 mg via INTRAVENOUS

## 2018-06-25 MED ORDER — DEXAMETHASONE SODIUM PHOSPHATE 10 MG/ML IJ SOLN
INTRAMUSCULAR | Status: AC
  Filled 2018-06-25: qty 1

## 2018-06-25 MED ORDER — ONDANSETRON HCL 4 MG/2ML IJ SOLN
INTRAMUSCULAR | Status: AC
  Filled 2018-06-25: qty 2

## 2018-06-25 MED ORDER — MEPERIDINE HCL 50 MG/ML IJ SOLN: 6.2500 mg | INTRAMUSCULAR | Status: DC | PRN

## 2018-06-25 MED ORDER — ACETAMINOPHEN 160 MG/5ML PO SOLN
325.0000 mg | ORAL | Status: DC | PRN
  Filled 2018-06-25: qty 20.3

## 2018-06-25 MED ORDER — HYDROCODONE-ACETAMINOPHEN 5-325 MG PO TABS: 1.0000 | ORAL_TABLET | ORAL | 0 refills | Status: DC | PRN

## 2018-06-25 MED ORDER — LACTATED RINGERS IV SOLN
INTRAVENOUS | Status: DC
  Administered 2018-06-25: 11:00:00 via INTRAVENOUS

## 2018-06-25 MED ORDER — PROPOFOL 10 MG/ML IV BOLUS
INTRAVENOUS | Status: DC | PRN
  Administered 2018-06-25: 130 mg via INTRAVENOUS

## 2018-06-25 MED ORDER — SODIUM CHLORIDE FLUSH 0.9 % IV SOLN
INTRAVENOUS | Status: AC
  Filled 2018-06-25: qty 30

## 2018-06-25 MED ORDER — ROCURONIUM BROMIDE 100 MG/10ML IV SOLN
INTRAVENOUS | Status: DC | PRN
  Administered 2018-06-25: 50 mg via INTRAVENOUS

## 2018-06-25 MED ORDER — CEFAZOLIN SODIUM-DEXTROSE 2-4 GM/100ML-% IV SOLN
2.0000 g | INTRAVENOUS | Status: AC
  Administered 2018-06-25: 2 g via INTRAVENOUS

## 2018-06-25 MED ORDER — SUGAMMADEX SODIUM 200 MG/2ML IV SOLN
INTRAVENOUS | Status: DC | PRN
  Administered 2018-06-25: 150.6 mg via INTRAVENOUS

## 2018-06-25 MED ORDER — ACETAMINOPHEN 10 MG/ML IV SOLN
INTRAVENOUS | Status: DC | PRN
  Administered 2018-06-25: 1000 mg via INTRAVENOUS

## 2018-06-25 MED ORDER — SODIUM CHLORIDE 0.9 % IV SOLN
INTRAVENOUS | Status: DC | PRN
  Administered 2018-06-25: 13 mL

## 2018-06-25 MED ORDER — ONDANSETRON HCL 4 MG/2ML IJ SOLN
INTRAMUSCULAR | Status: DC | PRN
  Administered 2018-06-25: 4 mg via INTRAVENOUS

## 2018-06-25 SURGICAL SUPPLY — 40 items
APPLIER CLIP ROT 10 11.4 M/L (STAPLE) ×3
BLADE SURG 11 STRL SS SAFETY (MISCELLANEOUS) ×3 IMPLANT
CANISTER SUCT 1200ML W/VALVE (MISCELLANEOUS) ×3 IMPLANT
CANNULA DILATOR  5MM W/SLV (CANNULA) ×2
CANNULA DILATOR 10 W/SLV (CANNULA) ×2 IMPLANT
CANNULA DILATOR 10MM W/SLV (CANNULA) ×1
CANNULA DILATOR 5 W/SLV (CANNULA) ×4 IMPLANT
CATH CHOLANG 76X19 KUMAR (CATHETERS) ×3 IMPLANT
CHLORAPREP W/TINT 26ML (MISCELLANEOUS) ×3 IMPLANT
CLIP APPLIE ROT 10 11.4 M/L (STAPLE) ×1 IMPLANT
CLOSURE WOUND 1/2 X4 (GAUZE/BANDAGES/DRESSINGS) ×1
CONRAY 60ML FOR OR (MISCELLANEOUS) ×3 IMPLANT
DISSECTOR KITTNER STICK (MISCELLANEOUS) ×1 IMPLANT
DISSECTORS/KITTNER STICK (MISCELLANEOUS) ×3
DRAPE SHEET LG 3/4 BI-LAMINATE (DRAPES) ×3 IMPLANT
DRSG TEGADERM 2-3/8X2-3/4 SM (GAUZE/BANDAGES/DRESSINGS) ×12 IMPLANT
DRSG TELFA 4X3 1S NADH ST (GAUZE/BANDAGES/DRESSINGS) ×3 IMPLANT
ELECT REM PT RETURN 9FT ADLT (ELECTROSURGICAL) ×3
ELECTRODE REM PT RTRN 9FT ADLT (ELECTROSURGICAL) ×1 IMPLANT
GLOVE BIO SURGEON STRL SZ7.5 (GLOVE) ×3 IMPLANT
GLOVE INDICATOR 8.0 STRL GRN (GLOVE) ×3 IMPLANT
GOWN STRL REUS W/ TWL LRG LVL3 (GOWN DISPOSABLE) ×3 IMPLANT
GOWN STRL REUS W/TWL LRG LVL3 (GOWN DISPOSABLE) ×6
IRRIGATION STRYKERFLOW (MISCELLANEOUS) ×1 IMPLANT
IRRIGATOR STRYKERFLOW (MISCELLANEOUS) ×3
IV LACTATED RINGERS 1000ML (IV SOLUTION) ×3 IMPLANT
KIT TURNOVER KIT A (KITS) ×3 IMPLANT
LABEL OR SOLS (LABEL) ×3 IMPLANT
NDL INSUFF ACCESS 14 VERSASTEP (NEEDLE) ×3 IMPLANT
NS IRRIG 500ML POUR BTL (IV SOLUTION) ×3 IMPLANT
PACK LAP CHOLECYSTECTOMY (MISCELLANEOUS) ×3 IMPLANT
POUCH SPECIMEN RETRIEVAL 10MM (ENDOMECHANICALS) IMPLANT
SCISSORS METZENBAUM CVD 33 (INSTRUMENTS) ×3 IMPLANT
STRIP CLOSURE SKIN 1/2X4 (GAUZE/BANDAGES/DRESSINGS) ×2 IMPLANT
SUT VIC AB 0 CT2 27 (SUTURE) ×3 IMPLANT
SUT VIC AB 4-0 FS2 27 (SUTURE) ×3 IMPLANT
SWABSTK COMLB BENZOIN TINCTURE (MISCELLANEOUS) ×3 IMPLANT
TROCAR XCEL NON-BLD 11X100MML (ENDOMECHANICALS) ×3 IMPLANT
TUBING INSUFFLATION (TUBING) ×3 IMPLANT
WATER STERILE IRR 1000ML POUR (IV SOLUTION) ×3 IMPLANT

## 2018-06-25 NOTE — Anesthesia Procedure Notes (Signed)
Procedure Name: Intubation Date/Time: 06/25/2018 12:58 PM Performed by: Doreen Salvage, CRNA Pre-anesthesia Checklist: Patient identified, Patient being monitored, Timeout performed, Emergency Drugs available and Suction available Patient Re-evaluated:Patient Re-evaluated prior to induction Oxygen Delivery Method: Circle system utilized Preoxygenation: Pre-oxygenation with 100% oxygen Induction Type: IV induction Ventilation: Mask ventilation without difficulty Laryngoscope Size: Mac and 3 Grade View: Grade II Tube type: Oral Tube size: 7.0 mm Number of attempts: 1 Airway Equipment and Method: Stylet Placement Confirmation: ETT inserted through vocal cords under direct vision,  positive ETCO2 and breath sounds checked- equal and bilateral Secured at: 21 cm Tube secured with: Tape Dental Injury: Teeth and Oropharynx as per pre-operative assessment  Difficulty Due To: Difficult Airway- due to anterior larynx

## 2018-06-25 NOTE — H&P (Signed)
No change in clinical history or exam. For cholecystectomy for polyp.

## 2018-06-25 NOTE — Anesthesia Preprocedure Evaluation (Signed)
Anesthesia Evaluation  Patient identified by MRN, date of birth, ID band Patient awake    Reviewed: Allergy & Precautions, H&P , NPO status , reviewed documented beta blocker date and time   Airway Mallampati: II  TM Distance: >3 FB     Dental   Pulmonary           Cardiovascular hypertension,      Neuro/Psych    GI/Hepatic GERD  ,(+) Hepatitis -  Endo/Other  Hypothyroidism   Renal/GU Renal disease     Musculoskeletal  (+) Arthritis ,   Abdominal   Peds  Hematology  (+) anemia ,   Anesthesia Other Findings Past Medical History: No date: Anemia     Comment:  iron deficient No date: Arthritis 05/10/2016: Autoimmune hepatitis ([place])     Comment:  Hep C No date: B12 deficiency 01/2016: Barrett esophagus No date: Bradycardia No date: Chronic kidney disease No date: Diverticulosis No date: Fibrocystic breast disease No date: GERD (gastroesophageal reflux disease) No date: Hypercholesterolemia No date: Hypertension No date: Hypothyroidism No date: Ulcer disease Past Surgical History: 01/30/2016: ESOPHAGOGASTRODUODENOSCOPY (EGD) WITH PROPOFOL; N/A     Comment:  Procedure: ESOPHAGOGASTRODUODENOSCOPY (EGD) WITH               PROPOFOL;  Surgeon: Lollie Sails, MD;  Location:               [facility] ENDOSCOPY;  Service: Endoscopy;  Laterality: N/A; 01/14/2018: ESOPHAGOGASTRODUODENOSCOPY (EGD) WITH PROPOFOL; N/A     Comment:  Procedure: ESOPHAGOGASTRODUODENOSCOPY (EGD) WITH               PROPOFOL;  Surgeon: Lollie Sails, MD;  Location:               [facility] ENDOSCOPY;  Service: Endoscopy;  Laterality: N/A; 05/20/2018: ESOPHAGOGASTRODUODENOSCOPY (EGD) WITH PROPOFOL; N/A     Comment:  Procedure: ESOPHAGOGASTRODUODENOSCOPY (EGD) WITH               PROPOFOL;  Surgeon: Lollie Sails, MD;  Location:               [facility] ENDOSCOPY;  Service: Endoscopy;  Laterality: N/A; No date: HERNIA REPAIR 07/08/08: lap hiatal hernia  repair 1993: LEFT OOPHORECTOMY     Comment:  benign ovarian cyst 1981: TUBAL LIGATION BMI    Body Mass Index:  32.42 kg/m     Reproductive/Obstetrics                             Anesthesia Physical Anesthesia Plan  ASA: III  Anesthesia Plan: General ETT   Post-op Pain Management:    Induction: Intravenous  PONV Risk Score and Plan: 4 or greater and Ondansetron, Treatment may vary due to age or medical condition, Midazolam, Dexamethasone and Metaclopromide  Airway Management Planned:   Additional Equipment:   Intra-op Plan:   Post-operative Plan: Extubation in OR  Informed Consent: I have reviewed the patients History and Physical, chart, labs and discussed the procedure including the risks, benefits and alternatives for the proposed anesthesia with the patient or authorized representative who has indicated his/her understanding and acceptance.   Dental Advisory Given  Plan Discussed with: CRNA  Anesthesia Plan Comments:         Anesthesia Quick Evaluation

## 2018-06-25 NOTE — Discharge Instructions (Signed)
AMBULATORY SURGERY  °DISCHARGE INSTRUCTIONS ° ° °1) The drugs that you were given will stay in your system until tomorrow so for the next 24 hours you should not: ° °A) Drive an automobile °B) Make any legal decisions °C) Drink any alcoholic beverage ° ° °2) You may resume regular meals tomorrow.  Today it is better to start with liquids and gradually work up to solid foods. ° °You may eat anything you prefer, but it is better to start with liquids, then soup and crackers, and gradually work up to solid foods. ° ° °3) Please notify your doctor immediately if you have any unusual bleeding, trouble breathing, redness and pain at the surgery site, drainage, fever, or pain not relieved by medication. ° ° ° °4) Additional Instructions: ° ° ° ° ° ° ° °Please contact your physician with any problems or Same Day Surgery at [phone], Monday through Friday 6 am to 4 pm, or [place] [facility] [place] [facility] number at [phone]. °

## 2018-06-25 NOTE — Transfer of Care (Signed)
Immediate Anesthesia Transfer of Care Note  Patient: Lori [name]  Procedure(s) Performed: LAPAROSCOPIC CHOLECYSTECTOMY WITH INTRAOPERATIVE CHOLANGIOGRAM (N/A )  Patient Location: PACU  Anesthesia Type:General  Level of Consciousness: awake, alert  and oriented  Airway & Oxygen Therapy: Patient Spontanous Breathing and Patient connected to face mask oxygen  Post-op Assessment: Report given to RN and Post -op Vital signs reviewed and stable  Post vital signs: Reviewed and stable  Last Vitals:  Vitals Value Taken Time  BP 150/61 06/25/2018  1:55 PM  Temp 36.6 C 06/25/2018  1:55 PM  Pulse 83 06/25/2018  1:56 PM  Resp 15 06/25/2018  1:56 PM  SpO2 95 % 06/25/2018  1:56 PM  Vitals shown include unvalidated device data.  Last Pain:  Vitals:   06/25/18 1046  TempSrc: Temporal  PainSc: 0-No pain         Complications: No apparent anesthesia complications

## 2018-06-25 NOTE — Anesthesia Post-op Follow-up Note (Signed)
Anesthesia QCDR form completed.        

## 2018-06-25 NOTE — OR Nursing (Signed)
Dr. Bary Castilla in to see pt @ 1556, advises ok to d/c to home.

## 2018-06-25 NOTE — Op Note (Signed)
Preoperative diagnosis: 2 cm gallbladder polyp.  Postoperative diagnosis: Chronic cholecystitis.  Operative procedure: Laparoscopic cholecystectomy with intraoperative cholangiograms.  Operating Surgeon: Hervey Ard, MD.  Anesthesia: General endotracheal.  Estimated blood loss: 10 cc.  Clinical note: This 84 year old woman is had 2 ultrasound examination showing a 2 cm gallbladder polyp in the fundus.  With the risk of malignancy with polyps over 1 cm, elective cholecystectomy was recommended.  The patient has been clinically asymptomatic.  Operative note: The patient received preoperative antibiotics based on her age.  She underwent general endotracheal anesthesia without difficulty.  She was placed in Trendelenburg position.  The abdomen was cleansed with ChloraPrep and draped.  An infraumbilical incision incision was made in the inferior umbilical fold.  The fascia was elevated with the laryngeal hook and a varies needle placed.  After assuring intra-abdominal location with a hanging drop test the abdomen was insufflated with CO2 at 10 mmHg pressure.  A 10 mm Step port was expanded.  Inspection showed no evidence of injury from initial port placement.  The patient was placed in reverse Trendelenburg position and rolled to the left.  An 11 mm XL port was placed in the epigastrium and 2-5 mm Step ports were placed on the right lateral abdominal wall.  The gallbladder was a modest sea foam green color.  No adhesions or pericholecystic fluid..  The gallbladder was placed on cephalad traction.  The neck of the gallbladder was cleared.  There is a mildly enlarged lymph node in the triangle of kilo and this accounted for the majority of the bleeding.  This was controlled with electrocautery.  The cystic duct and cystic artery were dissected clear.  Fluoroscopic cholangiograms were completed using 15 cc of one half strength Conray 60.  This showed a long cystic duct, prompt filling of the hepatic duct  and free flow into the common bile duct and in through the ampulla into the duodenum.  The cystic duct and cystic artery were doubly clipped and divided.  The gallbladder was then removed from the liver bed making use of hook cautery dissection.  It was delivered to the umbilical port site.  There was some faint thickening at the fundus the gallbladder but the open specimen showed no polypoid lesion.  After reestablishing pneumoperitoneum inspection from the epigastric site showed no evidence of injury from initial port placement.  The right upper quadrant was irrigated with lactated Ringer solution.  The previously placed clips were intact and no bleeding was noted.  The liver appeared normal in appearance and for that reason liver biopsy was not completed based on prior discussion with Dr. Gustavo Lah from GI.  The abdomen was then desufflated and ports removed under direct vision. Fascia at the umbilicus and in the epigastric site was approximated with a single 0 Vicryl Simple suture.  Skin incisions were closed with 4-0 Vicryl subarticular sutures.  Benzoin, Steri-Strips, Telfa and Tegaderm dressings were applied.  The patient tolerated the procedure well was taken to recovery room in stable condition.

## 2018-06-26 ENCOUNTER — Encounter: Payer: Self-pay | Admitting: General Surgery

## 2018-06-26 NOTE — Anesthesia Postprocedure Evaluation (Signed)
Anesthesia Post Note  Patient: Lori [name]  Procedure(s) Performed: LAPAROSCOPIC CHOLECYSTECTOMY WITH INTRAOPERATIVE CHOLANGIOGRAM (N/A )  Patient location during evaluation: PACU Anesthesia Type: General Level of consciousness: awake and alert Pain management: pain level controlled Vital Signs Assessment: post-procedure vital signs reviewed and stable Respiratory status: spontaneous breathing, nonlabored ventilation, respiratory function stable and patient connected to nasal cannula oxygen Cardiovascular status: blood pressure returned to baseline and stable Postop Assessment: no apparent nausea or vomiting Anesthetic complications: no Comments: T wave changes noted on EKG, eval by cardiology     Last Vitals:  Vitals:   06/25/18 1529 06/25/18 1556  BP: (!) 148/60 129/66  Pulse: 64 (!) 57  Resp: 16 16  Temp: (!) 36.1 C (!) 36.3 C  SpO2: 98% 96%    Last Pain:  Vitals:   06/26/18 0833  TempSrc:   PainSc: 0-No pain                 Alphonsus Sias

## 2018-06-27 LAB — SURGICAL PATHOLOGY

## 2018-07-02 ENCOUNTER — Ambulatory Visit (INDEPENDENT_AMBULATORY_CARE_PROVIDER_SITE_OTHER): Payer: Medicare Other | Admitting: General Surgery

## 2018-07-02 ENCOUNTER — Encounter: Payer: Self-pay | Admitting: General Surgery

## 2018-07-02 VITALS — BP 122/76 | HR 72 | Resp 14 | Ht 60.0 in | Wt 163.0 lb

## 2018-07-02 DIAGNOSIS — K828 Other specified diseases of gallbladder: Secondary | ICD-10-CM

## 2018-07-02 NOTE — Patient Instructions (Addendum)
May continue to use heating pad as needed.  Follow up as needed

## 2018-07-02 NOTE — Progress Notes (Signed)
Patient ID: Lori [name], female   DOB: 20-Feb-1934, 84 y.o.   MRN: [id number]  Chief Complaint  Patient presents with  . Routine Post Op    HPI Lori [name] is a 84 y.o. female here for a post op follow up for cholecystectomy. She reports that she is doing well and eating well. She reports no further pain. She has some soreness at two of her port sites.  HPI  Past Medical History:  Diagnosis Date  . Anemia    iron deficient  . Arthritis   . Autoimmune hepatitis ([place]) 05/10/2016   Hep C  . B12 deficiency   . Barrett esophagus 01/2016  . Bradycardia   . Chronic kidney disease   . Diverticulosis   . Fibrocystic breast disease   . GERD (gastroesophageal reflux disease)   . Hypercholesterolemia   . Hypertension   . Hypothyroidism   . Ulcer disease     Past Surgical History:  Procedure Laterality Date  . CHOLECYSTECTOMY N/A 06/25/2018   Procedure: LAPAROSCOPIC CHOLECYSTECTOMY WITH INTRAOPERATIVE CHOLANGIOGRAM;  Surgeon: Robert Bellow, MD;  Location: [facility] ORS;  Service: General;  Laterality: N/A;  . ESOPHAGOGASTRODUODENOSCOPY (EGD) WITH PROPOFOL N/A 01/30/2016   Procedure: ESOPHAGOGASTRODUODENOSCOPY (EGD) WITH PROPOFOL;  Surgeon: Lollie Sails, MD;  Location: [facility] ENDOSCOPY;  Service: Endoscopy;  Laterality: N/A;  . ESOPHAGOGASTRODUODENOSCOPY (EGD) WITH PROPOFOL N/A 01/14/2018   Procedure: ESOPHAGOGASTRODUODENOSCOPY (EGD) WITH PROPOFOL;  Surgeon: Lollie Sails, MD;  Location: [facility] ENDOSCOPY;  Service: Endoscopy;  Laterality: N/A;  . ESOPHAGOGASTRODUODENOSCOPY (EGD) WITH PROPOFOL N/A 05/20/2018   Procedure: ESOPHAGOGASTRODUODENOSCOPY (EGD) WITH PROPOFOL;  Surgeon: Lollie Sails, MD;  Location: [facility] ENDOSCOPY;  Service: Endoscopy;  Laterality: N/A;  . HERNIA REPAIR    . lap hiatal hernia repair  07/08/08  . LEFT OOPHORECTOMY  1993   benign ovarian cyst  . TUBAL LIGATION  1981    Family History  Problem Relation Age of Onset  . Stroke Mother   .  Breast cancer Neg Hx   . Colon cancer Neg Hx     Social History Social History   Tobacco Use  . Smoking status: Never Smoker  . Smokeless tobacco: Never Used  Substance Use Topics  . Alcohol use: No    Alcohol/week: 0.0 oz  . Drug use: No    Allergies  Allergen Reactions  . Penicillins Other (See Comments)    Dizziness and passed out Has patient had a PCN reaction causing immediate rash, facial/tongue/throat swelling, SOB or lightheadedness with hypotension: No Has patient had a PCN reaction causing severe rash involving mucus membranes or skin necrosis: No Has patient had a PCN reaction that required hospitalization: No Has patient had a PCN reaction occurring within the last 10 years: No  If all of the above answers are "NO", then may proceed with Cephalosporin use.   Lanae Crumbly [Oxaprozin] Rash  . Lodine [Etodolac] Rash    Current Outpatient Medications  Medication Sig Dispense Refill  . aspirin 81 MG tablet Take 81 mg by mouth daily.    Marland Kitchen atenolol (TENORMIN) 25 MG tablet TAKE ONE (1) TABLET BY MOUTH EVERY DAY (Patient taking differently: Take 12.5 mg by mouth daily) 90 tablet 1  . benazepril (LOTENSIN) 20 MG tablet Take 1 tablet (20 mg total) by mouth daily. 90 tablet 1  . calcitonin, salmon, (MIACALCIN/FORTICAL) 200 UNIT/ACT nasal spray USE 1 SPRAY IN ONE NOSTRIL DAILY. ALTERNATE NOSTRILS. 3.7 mL 0  . Calcium-Phosphorus-Vitamin D (CITRACAL +D3 PO) Take 1 tablet  by mouth daily.    . chlorzoxazone (PARAFON) 500 MG tablet TAKE ONE TABLET BY MOUTH AT BEDTIME AS NEEDED (Patient taking differently: Take 500 mg by mouth at bedtime as needed for muscle spasms. ) 30 tablet 0  . Cholecalciferol (D3-1000 PO) Take 1,000 Units by mouth daily.     . Cyanocobalamin (B-12) 1000 MCG/ML KIT Inject 1,000 mcg as directed every 30 (thirty) days.     Marland Kitchen GLUCOSAMINE-CHONDROITIN PO Take 1 tablet by mouth 2 (two) times daily.     Marland Kitchen levothyroxine (SYNTHROID, LEVOTHROID) 75 MCG tablet Take 1 tablet  (75 mcg total) by mouth daily before breakfast. 90 tablet 1  . Magnesium Oxide 400 (240 Mg) MG TABS TAKE ONE (1) TABLET BY MOUTH EVERY DAY (Patient taking differently: Take 400 mg by mouth daily as needed for cramps) 30 tablet 1  . Mesalamine (DELZICOL) 400 MG CPDR Take 400 mg by mouth 3 (three) times daily.    . pantoprazole (PROTONIX) 40 MG tablet Take 40 mg by mouth 2 (two) times daily.     Vladimir Faster Glycol-Propyl Glycol (SYSTANE OP) Take 1 drop by mouth daily as needed (for dry eyes).    . Wheat Dextrin (BENEFIBER DRINK MIX) PACK Take 1 each by mouth daily.     Marland Kitchen zolpidem (AMBIEN CR) 6.25 MG CR tablet TAKE ONE TABLET AT BED- TIME IF NEEDED FOR SLEEP. 30 tablet 1   No current facility-administered medications for this visit.     Review of Systems Review of Systems  Constitutional: Negative.   Respiratory: Negative.   Cardiovascular: Negative.   Gastrointestinal: Negative.     Blood pressure 122/76, pulse 72, resp. rate 14, height 5' (1.524 m), weight 163 lb (73.9 kg).  Physical Exam Physical Exam  Constitutional: She is oriented to person, place, and time. She appears well-developed and well-nourished.  Cardiovascular: Normal rate and regular rhythm.  Pulmonary/Chest: Effort normal and breath sounds normal.  Neurological: She is alert and oriented to person, place, and time.  Skin: Skin is warm and dry.  Psychiatric: She has a normal mood and affect.    Data Reviewed DIAGNOSIS:  A. GALLBLADDER; CHOLECYSTECTOMY:  - ACUTE AND CHRONIC CHOLECYSTITIS WITH THICKENED WALL CONTAINING  LYMPHOID FOLLICLES, AND BILE PIGMENT WITHIN ROKITANSKY-ASCHOFF SINUSES.  - NEGATIVE FOR DYSPLASIA AND MALIGNANCY.   Assessment    Doing well post cholecystectomy.    Plan    The patient has noted some postprandial bowel movements, I anticipate these will resolve over the next few weeks.  She was encouraged to call if they persist.  She may broaden her dietary palate as she desires.  No  forbidden foods. May continue to use heating pad as needed.  Follow up as needed     HPI, Physical Exam, Assessment and Plan have been scribed under the direction and in the presence of Robert Bellow, MD  Concepcion Living, LPN   I have completed the exam and reviewed the above documentation for accuracy and completeness.  I agree with the above.  Haematologist has been used and any errors in dictation or transcription are unintentional.  Hervey Ard, M.D., F.A.C.S.  Forest Gleason [place] 07/02/2018, 5:27 PM

## 2018-07-08 ENCOUNTER — Ambulatory Visit (INDEPENDENT_AMBULATORY_CARE_PROVIDER_SITE_OTHER): Payer: Medicare Other | Admitting: *Deleted

## 2018-07-08 DIAGNOSIS — E538 Deficiency of other specified B group vitamins: Secondary | ICD-10-CM | POA: Diagnosis not present

## 2018-07-08 MED ORDER — CYANOCOBALAMIN 1000 MCG/ML IJ SOLN
1000.0000 ug | Freq: Once | INTRAMUSCULAR | Status: AC
  Administered 2018-07-08: 1000 ug via INTRAMUSCULAR

## 2018-07-08 NOTE — Progress Notes (Signed)
Patient presented for B 12 injection to left deltoid, patient voiced no concerns nor showed any signs of distress during injection. 

## 2018-07-15 ENCOUNTER — Other Ambulatory Visit: Payer: Self-pay | Admitting: Internal Medicine

## 2018-07-29 ENCOUNTER — Other Ambulatory Visit: Payer: Self-pay | Admitting: Internal Medicine

## 2018-07-29 NOTE — Telephone Encounter (Signed)
ok'd rx for ambien #30 with one refill.   

## 2018-07-29 NOTE — Telephone Encounter (Signed)
Last OV 05/01/18 Next OV 09/12/18 Last refill 04/14/18

## 2018-07-30 DIAGNOSIS — K2270 Barrett's esophagus without dysplasia: Secondary | ICD-10-CM | POA: Diagnosis not present

## 2018-07-30 DIAGNOSIS — Z9049 Acquired absence of other specified parts of digestive tract: Secondary | ICD-10-CM | POA: Diagnosis not present

## 2018-07-30 DIAGNOSIS — K754 Autoimmune hepatitis: Secondary | ICD-10-CM | POA: Diagnosis not present

## 2018-07-30 DIAGNOSIS — K52831 Collagenous colitis: Secondary | ICD-10-CM | POA: Diagnosis not present

## 2018-07-30 DIAGNOSIS — R197 Diarrhea, unspecified: Secondary | ICD-10-CM | POA: Diagnosis not present

## 2018-08-12 ENCOUNTER — Ambulatory Visit (INDEPENDENT_AMBULATORY_CARE_PROVIDER_SITE_OTHER): Payer: Medicare Other

## 2018-08-12 DIAGNOSIS — E538 Deficiency of other specified B group vitamins: Secondary | ICD-10-CM

## 2018-08-12 MED ORDER — CYANOCOBALAMIN 1000 MCG/ML IJ SOLN
1000.0000 ug | Freq: Once | INTRAMUSCULAR | Status: AC
  Administered 2018-08-12: 1000 ug via INTRAMUSCULAR

## 2018-08-12 NOTE — Progress Notes (Addendum)
b12 injection given in right deltoid. Patient tolerated well.  Reviewed.  Dr Nicki Reaper

## 2018-08-15 ENCOUNTER — Other Ambulatory Visit: Payer: Self-pay

## 2018-08-15 ENCOUNTER — Other Ambulatory Visit: Payer: Self-pay | Admitting: Internal Medicine

## 2018-08-15 MED ORDER — BENAZEPRIL HCL 20 MG PO TABS: 20.0000 mg | ORAL_TABLET | Freq: Every day | ORAL | 1 refills | Status: DC

## 2018-08-27 ENCOUNTER — Other Ambulatory Visit: Payer: Self-pay | Admitting: Internal Medicine

## 2018-08-27 DIAGNOSIS — R252 Cramp and spasm: Secondary | ICD-10-CM

## 2018-09-12 ENCOUNTER — Ambulatory Visit (INDEPENDENT_AMBULATORY_CARE_PROVIDER_SITE_OTHER): Payer: Medicare Other | Admitting: Internal Medicine

## 2018-09-12 ENCOUNTER — Encounter: Payer: Self-pay | Admitting: Internal Medicine

## 2018-09-12 VITALS — BP 122/68 | HR 63 | Temp 97.7°F | Resp 18 | Wt 161.8 lb

## 2018-09-12 DIAGNOSIS — I10 Essential (primary) hypertension: Secondary | ICD-10-CM | POA: Diagnosis not present

## 2018-09-12 DIAGNOSIS — E7800 Pure hypercholesterolemia, unspecified: Secondary | ICD-10-CM

## 2018-09-12 DIAGNOSIS — K209 Esophagitis, unspecified: Secondary | ICD-10-CM | POA: Diagnosis not present

## 2018-09-12 DIAGNOSIS — E538 Deficiency of other specified B group vitamins: Secondary | ICD-10-CM

## 2018-09-12 DIAGNOSIS — K754 Autoimmune hepatitis: Secondary | ICD-10-CM | POA: Diagnosis not present

## 2018-09-12 DIAGNOSIS — D649 Anemia, unspecified: Secondary | ICD-10-CM

## 2018-09-12 DIAGNOSIS — R739 Hyperglycemia, unspecified: Secondary | ICD-10-CM | POA: Diagnosis not present

## 2018-09-12 DIAGNOSIS — R945 Abnormal results of liver function studies: Secondary | ICD-10-CM

## 2018-09-12 DIAGNOSIS — D472 Monoclonal gammopathy: Secondary | ICD-10-CM | POA: Diagnosis not present

## 2018-09-12 DIAGNOSIS — R7989 Other specified abnormal findings of blood chemistry: Secondary | ICD-10-CM

## 2018-09-12 DIAGNOSIS — E039 Hypothyroidism, unspecified: Secondary | ICD-10-CM

## 2018-09-12 DIAGNOSIS — K2270 Barrett's esophagus without dysplasia: Secondary | ICD-10-CM

## 2018-09-12 DIAGNOSIS — Z23 Encounter for immunization: Secondary | ICD-10-CM

## 2018-09-12 LAB — LIPID PANEL
Cholesterol: 156 mg/dL (ref 0–200)
HDL: 43.8 mg/dL (ref >39.00)
LDL Cholesterol: 93 mg/dL (ref 0–99)
NonHDL: 112.4
TRIGLYCERIDES: 99 mg/dL (ref 0.0–149.0)
Total CHOL/HDL Ratio: 4
VLDL: 19.8 mg/dL (ref 0.0–40.0)

## 2018-09-12 LAB — BASIC METABOLIC PANEL
BUN: 27 mg/dL — AB (ref 6–23)
CHLORIDE: 104 meq/L (ref 96–112)
CO2: 25 mEq/L (ref 19–32)
CREATININE: 0.99 mg/dL (ref 0.40–1.20)
Calcium: 9.7 mg/dL (ref 8.4–10.5)
GFR: 57.08 mL/min — ABNORMAL LOW (ref >60.00)
Glucose, Bld: 97 mg/dL (ref 70–99)
POTASSIUM: 4.4 meq/L (ref 3.5–5.1)
Sodium: 138 mEq/L (ref 135–145)

## 2018-09-12 LAB — HEMOGLOBIN A1C: Hgb A1c MFr Bld: 5.6 % (ref 4.6–6.5)

## 2018-09-12 LAB — HEPATIC FUNCTION PANEL
ALT: 10 U/L (ref 0–35)
AST: 14 U/L (ref 0–37)
Albumin: 4.2 g/dL (ref 3.5–5.2)
Alkaline Phosphatase: 41 U/L (ref 39–117)
BILIRUBIN DIRECT: 0.2 mg/dL (ref 0.0–0.3)
TOTAL PROTEIN: 7.8 g/dL (ref 6.0–8.3)
Total Bilirubin: 1.3 mg/dL — ABNORMAL HIGH (ref 0.2–1.2)

## 2018-09-12 MED ORDER — ZOLPIDEM TARTRATE ER 6.25 MG PO TBCR: EXTENDED_RELEASE_TABLET | ORAL | 1 refills | Status: DC

## 2018-09-12 MED ORDER — ATENOLOL 25 MG PO TABS: ORAL_TABLET | ORAL | 1 refills | Status: DC

## 2018-09-12 NOTE — Progress Notes (Signed)
Patient ID: Lori [name], female   DOB: 21-Mar-1934, 84 y.o.   MRN: [id number]   Subjective:    Patient ID: Lori [name], female    DOB: 08-07-1934, 84 y.o.   MRN: [id number]  HPI  Patient here for a scheduled follow up.  Is s/p cholecystectomy 06/25/18.  On questran.  Helping diarrhea.  Bowels are doing ok.  Tries to stay active.  No chest pain.  No sob.  No acid reflux.  No abdominal pain.  Blood pressures averaging 122-127/60s.  Saw cardiology.  holter revealed SB with frequent PACs and brief atrial runs.  ETT negative ischemia.  Overall she feels she is doing well.     Past Medical History:  Diagnosis Date  . Anemia    iron deficient  . Arthritis   . Autoimmune hepatitis ([place]) 05/10/2016   Hep C  . B12 deficiency   . Barrett esophagus 01/2016  . Bradycardia   . Chronic kidney disease   . Diverticulosis   . Fibrocystic breast disease   . GERD (gastroesophageal reflux disease)   . Hypercholesterolemia   . Hypertension   . Hypothyroidism   . Ulcer disease    Past Surgical History:  Procedure Laterality Date  . CHOLECYSTECTOMY N/A 06/25/2018   Procedure: LAPAROSCOPIC CHOLECYSTECTOMY WITH INTRAOPERATIVE CHOLANGIOGRAM;  Surgeon: Robert Bellow, MD;  Location: [facility] ORS;  Service: General;  Laterality: N/A;  . ESOPHAGOGASTRODUODENOSCOPY (EGD) WITH PROPOFOL N/A 01/30/2016   Procedure: ESOPHAGOGASTRODUODENOSCOPY (EGD) WITH PROPOFOL;  Surgeon: Lollie Sails, MD;  Location: [facility] ENDOSCOPY;  Service: Endoscopy;  Laterality: N/A;  . ESOPHAGOGASTRODUODENOSCOPY (EGD) WITH PROPOFOL N/A 01/14/2018   Procedure: ESOPHAGOGASTRODUODENOSCOPY (EGD) WITH PROPOFOL;  Surgeon: Lollie Sails, MD;  Location: [facility] ENDOSCOPY;  Service: Endoscopy;  Laterality: N/A;  . ESOPHAGOGASTRODUODENOSCOPY (EGD) WITH PROPOFOL N/A 05/20/2018   Procedure: ESOPHAGOGASTRODUODENOSCOPY (EGD) WITH PROPOFOL;  Surgeon: Lollie Sails, MD;  Location: [facility] ENDOSCOPY;  Service: Endoscopy;  Laterality:  N/A;  . HERNIA REPAIR    . lap hiatal hernia repair  07/08/08  . LEFT OOPHORECTOMY  1993   benign ovarian cyst  . TUBAL LIGATION  1981   Family History  Problem Relation Age of Onset  . Stroke Mother   . Breast cancer Neg Hx   . Colon cancer Neg Hx    Social History   Socioeconomic History  . Marital status: Widowed    Spouse name: Not on file  . Number of children: 0  . Years of education: Not on file  . Highest education level: Not on file  Occupational History  . Not on file  Social Needs  . Financial resource strain: Not hard at all  . Food insecurity:    Worry: Never true    Inability: Never true  . Transportation needs:    Medical: No    Non-medical: No  Tobacco Use  . Smoking status: Never Smoker  . Smokeless tobacco: Never Used  Substance and Sexual Activity  . Alcohol use: No    Alcohol/week: 0.0 standard drinks  . Drug use: No  . Sexual activity: Never  Lifestyle  . Physical activity:    Days per week: Not on file    Minutes per session: Not on file  . Stress: Not at all  Relationships  . Social connections:    Talks on phone: Not on file    Gets together: Not on file    Attends religious service: Not on file    Active member of club or organization:  Not on file    Attends meetings of clubs or organizations: Not on file    Relationship status: Not on file  Other Topics Concern  . Not on file  Social History Narrative  . Not on file    Outpatient Encounter Medications as of 09/12/2018  Medication Sig  . aspirin 81 MG tablet Take 81 mg by mouth daily.  Marland Kitchen atenolol (TENORMIN) 25 MG tablet Take 12.5 mg by mouth daily  . benazepril (LOTENSIN) 20 MG tablet Take 1 tablet (20 mg total) by mouth daily.  . calcitonin, salmon, (MIACALCIN/FORTICAL) 200 UNIT/ACT nasal spray USE 1 SPRAY IN ONE NOSTRIL DAILY. ALTERNATE NOSTRILS.  Marland Kitchen Calcium-Phosphorus-Vitamin D (CITRACAL +D3 PO) Take 1 tablet by mouth daily.  . chlorzoxazone (PARAFON) 500 MG tablet TAKE ONE  TABLET AT BED- TIME AS NEEDED.  Marland Kitchen Cholecalciferol (D3-1000 PO) Take 1,000 Units by mouth daily.   . Cyanocobalamin (B-12) 1000 MCG/ML KIT Inject 1,000 mcg as directed every 30 (thirty) days.   Marland Kitchen GLUCOSAMINE-CHONDROITIN PO Take 1 tablet by mouth 2 (two) times daily.   . Magnesium Oxide 400 (240 Mg) MG TABS TAKE ONE (1) TABLET BY MOUTH EVERY DAY (Patient taking differently: Take 400 mg by mouth daily as needed for cramps)  . Mesalamine (DELZICOL) 400 MG CPDR Take 400 mg by mouth 3 (three) times daily.  . pantoprazole (PROTONIX) 40 MG tablet Take 40 mg by mouth 2 (two) times daily.   Vladimir Faster Glycol-Propyl Glycol (SYSTANE OP) Take 1 drop by mouth daily as needed (for dry eyes).  . SYNTHROID 75 MCG tablet TAKE ONE TABLET DAILY BEFORE BREAKFAST.  Marland Kitchen Wheat Dextrin (BENEFIBER DRINK MIX) PACK Take 1 each by mouth daily.   Marland Kitchen zolpidem (AMBIEN CR) 6.25 MG CR tablet TAKE ONE TABLET AT BED- TIME IF NEEDED FOR SLEEP.  . [DISCONTINUED] atenolol (TENORMIN) 25 MG tablet TAKE ONE (1) TABLET BY MOUTH EVERY DAY (Patient taking differently: Take 12.5 mg by mouth daily)  . [DISCONTINUED] zolpidem (AMBIEN CR) 6.25 MG CR tablet TAKE ONE TABLET AT BED- TIME IF NEEDED FOR SLEEP.   No facility-administered encounter medications on file as of 09/12/2018.     Review of Systems  Constitutional: Negative for appetite change and unexpected weight change.  HENT: Negative for congestion and sinus pressure.   Respiratory: Negative for cough, chest tightness and shortness of breath.   Cardiovascular: Negative for chest pain, palpitations and leg swelling.  Gastrointestinal: Negative for abdominal pain, diarrhea, nausea and vomiting.  Genitourinary: Negative for difficulty urinating and dysuria.  Musculoskeletal: Negative for joint swelling and myalgias.  Skin: Negative for color change and rash.  Neurological: Negative for dizziness, light-headedness and headaches.  Psychiatric/Behavioral: Negative for agitation and  dysphoric mood.       Objective:     Blood pressure rechecked by me:  132/78  Physical Exam  Constitutional: She appears well-developed and well-nourished. No distress.  HENT:  Nose: Nose normal.  Mouth/Throat: Oropharynx is clear and moist.  Neck: Neck supple. No thyromegaly present.  Cardiovascular: Normal rate and regular rhythm.  Pulmonary/Chest: Breath sounds normal. No respiratory distress. She has no wheezes.  Abdominal: Soft. Bowel sounds are normal. There is no tenderness.  Musculoskeletal: She exhibits no edema or tenderness.  Lymphadenopathy:    She has no cervical adenopathy.  Skin: No rash noted. No erythema.  Psychiatric: She has a normal mood and affect. Her behavior is normal.    BP 122/68 (BP Location: Left Arm, Patient Position: Sitting, Cuff Size: Normal)  Pulse 63   Temp 97.7 F (36.5 C) (Oral)   Resp 18   Wt 161 lb 12.8 oz (73.4 kg)   SpO2 97%   BMI 31.60 kg/m  Wt Readings from Last 3 Encounters:  09/12/18 161 lb 12.8 oz (73.4 kg)  07/02/18 163 lb (73.9 kg)  06/25/18 166 lb (75.3 kg)     Lab Results  Component Value Date   WBC 4.0 06/04/2018   HGB 12.3 06/04/2018   HCT 36.9 06/04/2018   PLT 195.0 06/04/2018   GLUCOSE 97 09/12/2018   CHOL 156 09/12/2018   TRIG 99.0 09/12/2018   HDL 43.80 09/12/2018   LDLCALC 93 09/12/2018   ALT 10 09/12/2018   AST 14 09/12/2018   NA 138 09/12/2018   K 4.4 09/12/2018   CL 104 09/12/2018   CREATININE 0.99 09/12/2018   BUN 27 (H) 09/12/2018   CO2 25 09/12/2018   TSH 4.46 06/04/2018   INR 0.89 06/16/2018   HGBA1C 5.6 09/12/2018       Assessment & Plan:   Problem List Items Addressed This Visit    Abnormal liver function tests    Worked up by GI.  Diagnosed with autoimmune hepatitis.  Has f/u ultrasound planned 02/2018.  Follow liver panel.        Anemia    Previously evaluated by hematology.  Found to be iron deficient and B12 deficient and MGUS.  Follow cbc.       Autoimmune hepatitis ([place])  - Primary    Being followed by GI.  Liver function tests wnl.  S/p cholecystectomy for gallbladder polyp.  Follow.        Relevant Orders   Hepatic function panel (Completed)   B12 deficiency    Continue B12 injections.        Barrett's esophagus with esophagitis    Followed by GI.        Hyperglycemia    Low carb diet and exercise.  Follow met b and a1c.        Relevant Orders   Hemoglobin A1c (Completed)   Basic metabolic panel (Completed)   Hypertension    Blood pressure under good control.  Continue same medication regimen.  Follow pressures.  Follow metabolic panel.        Relevant Medications   atenolol (TENORMIN) 25 MG tablet   Other Relevant Orders   Basic metabolic panel (Completed)   Hypothyroidism    On thyroid replacement.  Follow tsh.        Relevant Medications   atenolol (TENORMIN) 25 MG tablet   MGUS (monoclonal gammopathy of unknown significance)    Followed by hematology.         Other Visit Diagnoses    Hypercholesteremia       Relevant Medications   atenolol (TENORMIN) 25 MG tablet   Other Relevant Orders   Lipid panel (Completed)   Need for influenza vaccination       Relevant Orders   Flu vaccine HIGH DOSE PF (Fluzone High dose) (Completed)       Einar Pheasant, MD

## 2018-09-14 ENCOUNTER — Encounter: Payer: Self-pay | Admitting: Internal Medicine

## 2018-09-14 NOTE — Assessment & Plan Note (Signed)
Low carb diet and exercise.  Follow met b and a1c.   

## 2018-09-14 NOTE — Assessment & Plan Note (Signed)
Continue B12 injections.   

## 2018-09-14 NOTE — Assessment & Plan Note (Signed)
Followed by GI

## 2018-09-14 NOTE — Assessment & Plan Note (Signed)
Previously evaluated by hematology.  Found to be iron deficient and B12 deficient and MGUS.  Follow cbc.  

## 2018-09-14 NOTE — Assessment & Plan Note (Signed)
Followed by hematology 

## 2018-09-14 NOTE — Assessment & Plan Note (Signed)
Worked up by GI.  Diagnosed with autoimmune hepatitis.  Has f/u ultrasound planned 02/2018.  Follow liver panel.

## 2018-09-14 NOTE — Assessment & Plan Note (Signed)
On thyroid replacement.  Follow tsh.  

## 2018-09-14 NOTE — Assessment & Plan Note (Signed)
Blood pressure under good control.  Continue same medication regimen.  Follow pressures.  Follow metabolic panel.   

## 2018-09-14 NOTE — Assessment & Plan Note (Signed)
Being followed by GI.  Liver function tests wnl.  S/p cholecystectomy for gallbladder polyp.  Follow.   

## 2018-09-15 ENCOUNTER — Other Ambulatory Visit: Payer: Self-pay | Admitting: Internal Medicine

## 2018-09-15 NOTE — Progress Notes (Signed)
Order placed for f/u liver panel.  

## 2018-09-16 ENCOUNTER — Ambulatory Visit: Payer: Medicare Other

## 2018-09-24 ENCOUNTER — Ambulatory Visit (INDEPENDENT_AMBULATORY_CARE_PROVIDER_SITE_OTHER): Payer: Medicare Other

## 2018-09-24 DIAGNOSIS — E538 Deficiency of other specified B group vitamins: Secondary | ICD-10-CM

## 2018-09-24 MED ORDER — CYANOCOBALAMIN 1000 MCG/ML IJ SOLN
1000.0000 ug | Freq: Once | INTRAMUSCULAR | Status: AC
  Administered 2018-09-24: 1000 ug via INTRAMUSCULAR

## 2018-09-24 NOTE — Progress Notes (Addendum)
Patient presented for B 12 injection to left deltoid, patient voiced no concerns nor showed any signs of distress during injection.  Reviewed.  Dr Scott 

## 2018-10-13 ENCOUNTER — Other Ambulatory Visit (INDEPENDENT_AMBULATORY_CARE_PROVIDER_SITE_OTHER): Payer: Medicare Other

## 2018-10-13 LAB — HEPATIC FUNCTION PANEL
ALT: 12 U/L (ref 0–35)
AST: 13 U/L (ref 0–37)
Albumin: 4.2 g/dL (ref 3.5–5.2)
Alkaline Phosphatase: 45 U/L (ref 39–117)
Bilirubin, Direct: 0.2 mg/dL (ref 0.0–0.3)
TOTAL PROTEIN: 7.8 g/dL (ref 6.0–8.3)
Total Bilirubin: 0.9 mg/dL (ref 0.2–1.2)

## 2018-10-15 ENCOUNTER — Encounter: Payer: Self-pay | Admitting: Internal Medicine

## 2018-10-28 ENCOUNTER — Ambulatory Visit (INDEPENDENT_AMBULATORY_CARE_PROVIDER_SITE_OTHER): Payer: Medicare Other

## 2018-10-28 DIAGNOSIS — E538 Deficiency of other specified B group vitamins: Secondary | ICD-10-CM

## 2018-10-28 MED ORDER — CYANOCOBALAMIN 1000 MCG/ML IJ SOLN
1000.0000 ug | Freq: Once | INTRAMUSCULAR | Status: AC
  Administered 2018-10-28: 1000 ug via INTRAMUSCULAR

## 2018-10-28 NOTE — Progress Notes (Signed)
Pt is here today to receive B-12 shot. Was give in RD pt tolerated well.

## 2018-11-04 ENCOUNTER — Other Ambulatory Visit: Payer: Self-pay | Admitting: Internal Medicine

## 2018-11-06 DIAGNOSIS — R9431 Abnormal electrocardiogram [ECG] [EKG]: Secondary | ICD-10-CM | POA: Diagnosis not present

## 2018-11-06 DIAGNOSIS — I491 Atrial premature depolarization: Secondary | ICD-10-CM | POA: Diagnosis not present

## 2018-11-06 DIAGNOSIS — R001 Bradycardia, unspecified: Secondary | ICD-10-CM | POA: Diagnosis not present

## 2018-11-06 DIAGNOSIS — I10 Essential (primary) hypertension: Secondary | ICD-10-CM | POA: Diagnosis not present

## 2018-11-12 DIAGNOSIS — K2270 Barrett's esophagus without dysplasia: Secondary | ICD-10-CM | POA: Diagnosis not present

## 2018-11-12 DIAGNOSIS — K754 Autoimmune hepatitis: Secondary | ICD-10-CM | POA: Diagnosis not present

## 2018-11-12 DIAGNOSIS — K52831 Collagenous colitis: Secondary | ICD-10-CM | POA: Diagnosis not present

## 2018-12-02 ENCOUNTER — Ambulatory Visit (INDEPENDENT_AMBULATORY_CARE_PROVIDER_SITE_OTHER): Payer: Medicare Other

## 2018-12-02 DIAGNOSIS — E538 Deficiency of other specified B group vitamins: Secondary | ICD-10-CM

## 2018-12-02 MED ORDER — CYANOCOBALAMIN 1000 MCG/ML IJ SOLN
1000.0000 ug | Freq: Once | INTRAMUSCULAR | Status: AC
  Administered 2018-12-02: 1000 ug via INTRAMUSCULAR

## 2018-12-02 NOTE — Progress Notes (Signed)
Pt was here to for a NV for b-12 shot given in IM in the LD. Pt tolerated well.

## 2018-12-05 ENCOUNTER — Other Ambulatory Visit: Payer: Self-pay | Admitting: Internal Medicine

## 2018-12-11 NOTE — Telephone Encounter (Signed)
rx ok'd for ambien #30 with one refill.   

## 2018-12-15 DIAGNOSIS — H2513 Age-related nuclear cataract, bilateral: Secondary | ICD-10-CM | POA: Diagnosis not present

## 2018-12-25 ENCOUNTER — Other Ambulatory Visit: Payer: Self-pay | Admitting: Internal Medicine

## 2018-12-25 DIAGNOSIS — R252 Cramp and spasm: Secondary | ICD-10-CM

## 2019-01-01 ENCOUNTER — Other Ambulatory Visit: Payer: Self-pay | Admitting: Internal Medicine

## 2019-01-06 ENCOUNTER — Ambulatory Visit (INDEPENDENT_AMBULATORY_CARE_PROVIDER_SITE_OTHER): Payer: Medicare Other

## 2019-01-06 DIAGNOSIS — E538 Deficiency of other specified B group vitamins: Secondary | ICD-10-CM

## 2019-01-06 MED ORDER — CYANOCOBALAMIN 1000 MCG/ML IJ SOLN
1000.0000 ug | Freq: Once | INTRAMUSCULAR | Status: AC
  Administered 2019-01-06: 1000 ug via INTRAMUSCULAR

## 2019-01-06 NOTE — Progress Notes (Signed)
Pt was seen today for B-12 shot given IM in the LD. Pt tolerated well.

## 2019-01-12 ENCOUNTER — Encounter: Payer: Self-pay | Admitting: *Deleted

## 2019-01-13 ENCOUNTER — Ambulatory Visit
Admission: RE | Admit: 2019-01-13 | Discharge: 2019-01-13 | Disposition: A | Discharge status: Home / Self Care | Payer: Medicare Other | Attending: Gastroenterology | Admitting: Gastroenterology

## 2019-01-13 ENCOUNTER — Ambulatory Visit: Payer: Medicare Other | Admitting: Anesthesiology

## 2019-01-13 ENCOUNTER — Other Ambulatory Visit: Payer: Self-pay

## 2019-01-13 ENCOUNTER — Encounter
Admission: RE | Disposition: A | Discharge status: Home / Self Care | Payer: Self-pay | Source: Home / Self Care | Attending: Gastroenterology

## 2019-01-13 DIAGNOSIS — M1990 Unspecified osteoarthritis, unspecified site: Secondary | ICD-10-CM | POA: Diagnosis not present

## 2019-01-13 DIAGNOSIS — K2270 Barrett's esophagus without dysplasia: Secondary | ICD-10-CM | POA: Insufficient documentation

## 2019-01-13 DIAGNOSIS — K754 Autoimmune hepatitis: Secondary | ICD-10-CM | POA: Diagnosis not present

## 2019-01-13 DIAGNOSIS — Z7982 Long term (current) use of aspirin: Secondary | ICD-10-CM | POA: Diagnosis not present

## 2019-01-13 DIAGNOSIS — Z79899 Other long term (current) drug therapy: Secondary | ICD-10-CM | POA: Diagnosis not present

## 2019-01-13 DIAGNOSIS — I10 Essential (primary) hypertension: Secondary | ICD-10-CM | POA: Diagnosis not present

## 2019-01-13 DIAGNOSIS — E039 Hypothyroidism, unspecified: Secondary | ICD-10-CM | POA: Diagnosis not present

## 2019-01-13 DIAGNOSIS — Z9049 Acquired absence of other specified parts of digestive tract: Secondary | ICD-10-CM | POA: Insufficient documentation

## 2019-01-13 DIAGNOSIS — K219 Gastro-esophageal reflux disease without esophagitis: Secondary | ICD-10-CM | POA: Diagnosis not present

## 2019-01-13 DIAGNOSIS — E538 Deficiency of other specified B group vitamins: Secondary | ICD-10-CM | POA: Insufficient documentation

## 2019-01-13 DIAGNOSIS — K2950 Unspecified chronic gastritis without bleeding: Secondary | ICD-10-CM | POA: Diagnosis not present

## 2019-01-13 DIAGNOSIS — K449 Diaphragmatic hernia without obstruction or gangrene: Secondary | ICD-10-CM | POA: Insufficient documentation

## 2019-01-13 DIAGNOSIS — K2970 Gastritis, unspecified, without bleeding: Secondary | ICD-10-CM | POA: Diagnosis not present

## 2019-01-13 DIAGNOSIS — K3189 Other diseases of stomach and duodenum: Secondary | ICD-10-CM | POA: Insufficient documentation

## 2019-01-13 HISTORY — PX: ESOPHAGOGASTRODUODENOSCOPY (EGD) WITH PROPOFOL: SHX5813

## 2019-01-13 SURGERY — ESOPHAGOGASTRODUODENOSCOPY (EGD) WITH PROPOFOL
Anesthesia: General

## 2019-01-13 MED ORDER — PROPOFOL 10 MG/ML IV BOLUS
INTRAVENOUS | Status: DC | PRN
  Administered 2019-01-13: 30 mg via INTRAVENOUS
  Administered 2019-01-13: 70 mg via INTRAVENOUS
  Administered 2019-01-13: 30 mg via INTRAVENOUS

## 2019-01-13 MED ORDER — LIDOCAINE 2% (20 MG/ML) 5 ML SYRINGE
INTRAMUSCULAR | Status: DC | PRN
  Administered 2019-01-13: 50 mg via INTRAVENOUS

## 2019-01-13 MED ORDER — FENTANYL CITRATE (PF) 100 MCG/2ML IJ SOLN
INTRAMUSCULAR | Status: AC
  Filled 2019-01-13: qty 2

## 2019-01-13 MED ORDER — SODIUM CHLORIDE 0.9 % IV SOLN
INTRAVENOUS | Status: DC
  Administered 2019-01-13: 13:00:00 via INTRAVENOUS

## 2019-01-13 MED ORDER — PROPOFOL 500 MG/50ML IV EMUL
INTRAVENOUS | Status: DC | PRN
  Administered 2019-01-13: 175 ug/kg/min via INTRAVENOUS

## 2019-01-13 MED ORDER — PROPOFOL 10 MG/ML IV BOLUS
INTRAVENOUS | Status: AC
  Filled 2019-01-13: qty 20

## 2019-01-13 NOTE — H&P (Signed)
Outpatient short stay form Pre-procedure 01/13/2019 1:07 PM Lollie Sails MD  Primary Physician: Einar Pheasant, MD  Reason for visit: EGD  History of present illness: Patient is a 85 year old female presenting today for an EGD in regards to follow-up for long segment Barrett's esophagus.  There was concern on her last procedure that there were some small esophageal varices related to her history of autoimmune hepatitis.  Apices were taken via brushing however unfortunately these came back as hypocellular and could not tell us the status of the Barrett's esophagus.  Recently had a cholecystectomy and there was no overt evidence of cirrhosis noted with the liver at that time.  Autoimmune hepatitis has been very stable and her LFTs have been normal for at least a year.    Current Facility-Administered Medications:  .  0.9 %  sodium chloride infusion, , Intravenous, Continuous, Lollie Sails, MD, Last Rate: 20 mL/hr at 01/13/19 1250  Medications Prior to Admission  Medication Sig Dispense Refill Last Dose  . atenolol (TENORMIN) 25 MG tablet Take 12.5 mg by mouth daily 90 tablet 1 01/13/2019 at 0530  . benazepril (LOTENSIN) 20 MG tablet Take 1 tablet (20 mg total) by mouth daily. 90 tablet 1 01/13/2019 at 0530  . chlorzoxazone (PARAFON) 500 MG tablet TAKE ONE TABLET AT BED- TIME AS NEEDED. 30 tablet 0   . cholestyramine (QUESTRAN) 4 g packet Take 4 g by mouth daily as needed.    01/10/2019  . GLUCOSAMINE-CHONDROITIN PO Take 1 tablet by mouth 2 (two) times daily.    01/12/2019 at Unknown time  . Mesalamine (DELZICOL) 400 MG CPDR Take 400 mg by mouth 2 (two) times daily.    01/12/2019 at Unknown time  . pantoprazole (PROTONIX) 40 MG tablet Take 40 mg by mouth 2 (two) times daily.    01/12/2019 at Unknown time  . Polyethyl Glycol-Propyl Glycol (SYSTANE OP) Take 1 drop by mouth daily as needed (for dry eyes).   Taking  . SYNTHROID 75 MCG tablet TAKE ONE TABLET DAILY BEFORE BREAKFAST. 30 tablet 5  01/12/2019 at Unknown time  . Wheat Dextrin (BENEFIBER DRINK MIX) PACK Take 1 each by mouth daily.    01/12/2019 at Unknown time  . zolpidem (AMBIEN CR) 6.25 MG CR tablet TAKE ONE TABLET AT BED- TIME IF NEEDED FOR SLEEP. 30 tablet 1   . aspirin 81 MG tablet Take 81 mg by mouth daily.   01/11/2019  . Calcium-Phosphorus-Vitamin D (CITRACAL +D3 PO) Take 1 tablet by mouth daily.   01/12/2019  . Cholecalciferol (D3-1000 PO) Take 1,000 Units by mouth daily.    01/12/2019  . Cyanocobalamin (B-12) 1000 MCG/ML KIT Inject 1,000 mcg as directed every 30 (thirty) days.    01/02/2019  . Magnesium Oxide 400 (240 Mg) MG TABS TAKE 1 TABLET DAILY. 30 tablet 0 01/10/2019     Allergies  Allergen Reactions  . Penicillins Other (See Comments)    Dizziness and passed out Has patient had a PCN reaction causing immediate rash, facial/tongue/throat swelling, SOB or lightheadedness with hypotension: No Has patient had a PCN reaction causing severe rash involving mucus membranes or skin necrosis: No Has patient had a PCN reaction that required hospitalization: No Has patient had a PCN reaction occurring within the last 10 years: No  If all of the above answers are "NO", then may proceed with Cephalosporin use.   Lanae Crumbly [Oxaprozin] Rash  . Lodine [Etodolac] Rash     Past Medical History:  Diagnosis Date  .  Anemia    iron deficient  . Arthritis   . Autoimmune hepatitis ([place]) 05/10/2016   Hep C  . B12 deficiency   . Barrett esophagus 01/2016  . Bradycardia   . Diverticulosis   . Fibrocystic breast disease   . GERD (gastroesophageal reflux disease)   . Hypertension   . Hypothyroidism   . Ulcer disease     Review of systems:      Physical Exam    Heart and lungs: Regular rate and rhythm without rub or gallop, lungs are bilaterally clear.    HEENT: Normocephalic atraumatic eyes are anicteric    Other:    Pertinant exam for procedure: Soft nontender nondistended bowel sounds positive  normoactive    Planned proceedures: EGD and indicated procedures I have discussed the risks benefits and complications of procedures to include not limited to bleeding, infection, perforation and the risk of sedation and the patient wishes to proceed.    Lollie Sails, MD Gastroenterology 01/13/2019  1:07 PM

## 2019-01-13 NOTE — Anesthesia Procedure Notes (Signed)
Date/Time: 01/13/2019 2:30 PM Performed by: Johnna Acosta, CRNA Pre-anesthesia Checklist: Patient identified, Emergency Drugs available, Suction available, Patient being monitored and Timeout performed Patient Re-evaluated:Patient Re-evaluated prior to induction Oxygen Delivery Method: Nasal cannula Preoxygenation: Pre-oxygenation with 100% oxygen Induction Type: IV induction

## 2019-01-13 NOTE — Anesthesia Post-op Follow-up Note (Signed)
Anesthesia QCDR form completed.        

## 2019-01-13 NOTE — Anesthesia Preprocedure Evaluation (Signed)
Anesthesia Evaluation  Patient identified by MRN, date of birth, ID band Patient awake    Reviewed: Allergy & Precautions, NPO status , Patient's Chart, lab work & pertinent test results  History of Anesthesia Complications Negative for: history of anesthetic complications  Airway Mallampati: III  TM Distance: >3 FB Neck ROM: Full    Dental no notable dental hx.    Pulmonary neg pulmonary ROS, neg sleep apnea, neg COPD,    breath sounds clear to auscultation- rhonchi (-) wheezing      Cardiovascular hypertension, (-) CAD, (-) Past MI, (-) Cardiac Stents and (-) CABG  Rhythm:Regular Rate:Normal - Systolic murmurs and - Diastolic murmurs    Neuro/Psych neg Seizures negative neurological ROS  negative psych ROS   GI/Hepatic GERD  ,(+) Hepatitis -, Autoimmune  Endo/Other  neg diabetesHypothyroidism   Renal/GU Renal InsufficiencyRenal disease     Musculoskeletal  (+) Arthritis ,   Abdominal (+) + obese,   Peds  Hematology  (+) anemia ,   Anesthesia Other Findings Past Medical History: No date: Anemia     Comment:  iron deficient No date: Arthritis 05/10/2016: Autoimmune hepatitis ([place])     Comment:  Hep C No date: B12 deficiency 01/2016: Barrett esophagus No date: Bradycardia No date: Diverticulosis No date: Fibrocystic breast disease No date: GERD (gastroesophageal reflux disease) No date: Hypertension No date: Hypothyroidism No date: Ulcer disease   Reproductive/Obstetrics                             Anesthesia Physical Anesthesia Plan  ASA: III  Anesthesia Plan: General   Post-op Pain Management:    Induction: Intravenous  PONV Risk Score and Plan: 2 and Propofol infusion  Airway Management Planned: Natural Airway  Additional Equipment:   Intra-op Plan:   Post-operative Plan:   Informed Consent: I have reviewed the patients History and Physical, chart, labs and  discussed the procedure including the risks, benefits and alternatives for the proposed anesthesia with the patient or authorized representative who has indicated his/her understanding and acceptance.     Dental advisory given  Plan Discussed with: CRNA and Anesthesiologist  Anesthesia Plan Comments:         Anesthesia Quick Evaluation

## 2019-01-13 NOTE — Transfer of Care (Signed)
Immediate Anesthesia Transfer of Care Note  Patient: Saniyah Mondesir  Procedure(s) Performed: ESOPHAGOGASTRODUODENOSCOPY (EGD) WITH PROPOFOL (N/A )  Patient Location: PACU  Anesthesia Type:General  Level of Consciousness: awake and alert   Airway & Oxygen Therapy: Patient Spontanous Breathing and Patient connected to nasal cannula oxygen  Post-op Assessment: Report given to RN and Post -op Vital signs reviewed and stable  Post vital signs: Reviewed and stable  Last Vitals:  Vitals Value Taken Time  BP 130/56 01/13/2019  3:01 PM  Temp 36.3 C 01/13/2019  2:58 PM  Pulse 55 01/13/2019  3:03 PM  Resp 24 01/13/2019  3:03 PM  SpO2 99 % 01/13/2019  3:03 PM  Vitals shown include unvalidated device data.  Last Pain:  Vitals:   01/13/19 1238  TempSrc: Tympanic         Complications: No apparent anesthesia complications

## 2019-01-13 NOTE — Op Note (Addendum)
[facility] Gastroenterology Patient Name: Lori [name] Procedure Date: 01/13/2019 12:59 PM MRN: [id number] Account #: [id number] Date of Birth: 09/26/1934 Admit Type: Outpatient Age: 85 Room: [facility] ENDO ROOM 3 Gender: Female Note Status: Finalized Procedure:            Upper GI endoscopy Indications:          Follow-up of Barrett's esophagus Providers:            Lollie Sails, MD Medicines:            Monitored Anesthesia Care Complications:        No immediate complications. All biopsy sites are noted                        to have good hemostasis. Procedure:            Pre-Anesthesia Assessment:                       - ASA Grade Assessment: III - A patient with severe                        systemic disease.                       After obtaining informed consent, the endoscope was                        passed under direct vision. Throughout the procedure,                        the patient's blood pressure, pulse, and oxygen                        saturations were monitored continuously. The Endoscope                        was introduced through the mouth, and advanced to the                        third part of duodenum. The upper GI endoscopy was                        accomplished without difficulty. The patient tolerated                        the procedure well. Findings:      The examined duodenum was normal.      Patchy mild inflammation characterized by erythema was found in the       gastric body. Biopsies were taken with a cold forceps for histology from       the antrum and jthe body, placed into separate jars.      A small hiatal hernia was found. The Z-line was a variable distance from       incisors; the hiatal hernia was sliding.      There were esophageal mucosal changes secondary to established       long-segment Barrett's disease present in the middle third of the       esophagus and in the lower third of the esophagus. The maximum        longitudinal extent of these mucosal changes was 9 cm  in length. Mucosa       was biopsied with a cold forceps for histology in a targeted manner and       in 4 quadrants at intervals of 2 cm at 23, 25, 27, 29, 31 and 33 cm from       the incisors. A total of 6 specimen bottles were sent to pathology.      The exam of the esophagus was otherwise normal. Although some vessels       are more prominant, there is no overt appearance of varices. Impression:           - Normal examined duodenum.                       - Bile gastritis. Biopsied.                       - Small hiatal hernia.                       - Esophageal mucosal changes secondary to established                        long-segment Barrett's disease. Biopsied. Recommendation:       - Await pathology results.                       - Continue present medications.                       - Return to GI clinic in 1 month. Procedure Code(s):    --- Professional ---                       [phone], Esophagogastroduodenoscopy, flexible, transoral;                        with biopsy, single or multiple Diagnosis Code(s):    --- Professional ---                       K22.70, Barrett's esophagus without dysplasia                       K29.60, Other gastritis without bleeding                       K44.9, Diaphragmatic hernia without obstruction or                        gangrene CPT copyright 2018 American Medical Association. All rights reserved. The codes documented in this report are preliminary and upon coder review may  be revised to meet current compliance requirements. Lollie Sails, MD 01/13/2019 3:00:13 PM This report has been signed electronically. Number of Addenda: 0 Note Initiated On: 01/13/2019 12:59 PM      [facility]

## 2019-01-14 ENCOUNTER — Encounter: Payer: Self-pay | Admitting: Gastroenterology

## 2019-01-14 NOTE — Anesthesia Postprocedure Evaluation (Signed)
Anesthesia Post Note  Patient: Lori [name]  Procedure(s) Performed: ESOPHAGOGASTRODUODENOSCOPY (EGD) WITH PROPOFOL (N/A )  Patient location during evaluation: Endoscopy Anesthesia Type: General Level of consciousness: awake and alert and oriented Pain management: pain level controlled Vital Signs Assessment: post-procedure vital signs reviewed and stable Respiratory status: spontaneous breathing, nonlabored ventilation and respiratory function stable Cardiovascular status: blood pressure returned to baseline and stable Postop Assessment: no signs of nausea or vomiting Anesthetic complications: no     Last Vitals:  Vitals:   01/13/19 1519 01/13/19 1529  BP: (!) 136/116 131/68  Pulse: (!) 53 (!) 51  Resp: 16 15  Temp:    SpO2: 100% 100%    Last Pain:  Vitals:   01/14/19 0750  TempSrc:   PainSc: 1                  Beni Turrell

## 2019-01-15 LAB — SURGICAL PATHOLOGY

## 2019-01-20 ENCOUNTER — Ambulatory Visit (INDEPENDENT_AMBULATORY_CARE_PROVIDER_SITE_OTHER): Payer: Medicare Other | Admitting: Internal Medicine

## 2019-01-20 ENCOUNTER — Encounter: Payer: Self-pay | Admitting: Internal Medicine

## 2019-01-20 DIAGNOSIS — R945 Abnormal results of liver function studies: Secondary | ICD-10-CM

## 2019-01-20 DIAGNOSIS — I10 Essential (primary) hypertension: Secondary | ICD-10-CM

## 2019-01-20 DIAGNOSIS — K754 Autoimmune hepatitis: Secondary | ICD-10-CM | POA: Diagnosis not present

## 2019-01-20 DIAGNOSIS — E039 Hypothyroidism, unspecified: Secondary | ICD-10-CM

## 2019-01-20 DIAGNOSIS — D649 Anemia, unspecified: Secondary | ICD-10-CM | POA: Diagnosis not present

## 2019-01-20 DIAGNOSIS — K2270 Barrett's esophagus without dysplasia: Secondary | ICD-10-CM

## 2019-01-20 DIAGNOSIS — K209 Esophagitis, unspecified: Secondary | ICD-10-CM | POA: Diagnosis not present

## 2019-01-20 DIAGNOSIS — R252 Cramp and spasm: Secondary | ICD-10-CM | POA: Diagnosis not present

## 2019-01-20 DIAGNOSIS — D472 Monoclonal gammopathy: Secondary | ICD-10-CM | POA: Diagnosis not present

## 2019-01-20 DIAGNOSIS — R739 Hyperglycemia, unspecified: Secondary | ICD-10-CM | POA: Diagnosis not present

## 2019-01-20 DIAGNOSIS — E538 Deficiency of other specified B group vitamins: Secondary | ICD-10-CM | POA: Diagnosis not present

## 2019-01-20 DIAGNOSIS — R7989 Other specified abnormal findings of blood chemistry: Secondary | ICD-10-CM

## 2019-01-20 MED ORDER — CHLORZOXAZONE 500 MG PO TABS: ORAL_TABLET | ORAL | 1 refills | Status: DC

## 2019-01-20 MED ORDER — MAGNESIUM OXIDE -MG SUPPLEMENT 400 (240 MG) MG PO TABS: 1.0000 | ORAL_TABLET | Freq: Every day | ORAL | 1 refills | Status: DC

## 2019-01-20 NOTE — Progress Notes (Signed)
Patient ID: Lori [name], female   DOB: August 04, 1934, 85 y.o.   MRN: [id number]   Subjective:    Patient ID: Lori [name], female    DOB: 06/08/1934, 85 y.o.   MRN: [id number]  HPI  Patient here for a scheduled follow up. Sees GI for f/u autoimmune hepatitis and collagenous colitis.  Has been on cholestyramine recently.  Stopped her diarrhea and she started having some issues with constipation.  Gi has been adjusting the cholestyramine dose.  Trying to get this regulated.  She also saw cardiology.  Holter revealed SB, frequent PACs and brief atrial runs.  No ischemia on stress testing.  Recommended continuing on atenolol 12.50m and f/u in 6 months.  No chest pain.  No sob.  No acid reflux.  Some cramping in her legs.     Past Medical History:  Diagnosis Date  . Anemia    iron deficient  . Arthritis   . [place]   Hep C  . B12 deficiency   . Barrett esophagus 01/2016  . Bradycardia   . Diverticulosis   . Fibrocystic breast disease   . GERD (gastroesophageal reflux disease)   . Hypertension   . Hypothyroidism   . Ulcer disease    Past Surgical History:  Procedure Laterality Date  . CHOLECYSTECTOMY N/A 06/25/2018   Procedure: LAPAROSCOPIC CHOLECYSTECTOMY WITH INTRAOPERATIVE CHOLANGIOGRAM;  Surgeon: BRobert Bellow MD;  Location: [facility] ORS;  Service: General;  Laterality: N/A;  . ESOPHAGOGASTRODUODENOSCOPY (EGD) WITH PROPOFOL N/A 01/30/2016   Procedure: ESOPHAGOGASTRODUODENOSCOPY (EGD) WITH PROPOFOL;  Surgeon: MLollie Sails MD;  [facility];  Service: Endoscopy;  Laterality: N/A;  . ESOPHAGOGASTRODUODENOSCOPY (EGD) WITH PROPOFOL N/A 01/14/2018   Procedure: ESOPHAGOGASTRODUODENOSCOPY (EGD) WITH PROPOFOL;  Surgeon: SLollie Sails MD;  [facility];  Service: Endoscopy;  Laterality: N/A;  . ESOPHAGOGASTRODUODENOSCOPY (EGD) WITH PROPOFOL N/A 05/20/2018   Procedure: ESOPHAGOGASTRODUODENOSCOPY (EGD) WITH PROPOFOL;   Surgeon: SLollie Sails MD;  [facility];  Service: Endoscopy;  Laterality: N/A;  . ESOPHAGOGASTRODUODENOSCOPY (EGD) WITH PROPOFOL N/A 01/13/2019   Procedure: ESOPHAGOGASTRODUODENOSCOPY (EGD) WITH PROPOFOL;  Surgeon: SLollie Sails MD;  [facility];  Service: Endoscopy;  Laterality: N/A;  . HERNIA REPAIR    . lap hiatal hernia repair  07/08/08  . LEFT OOPHORECTOMY  1993   benign ovarian cyst  . TUBAL LIGATION  1981   Family History  Problem Relation Age of Onset  . Stroke Mother   . Breast cancer Neg Hx   . Colon cancer Neg Hx    Social History   Socioeconomic History  . Marital status: Widowed    Spouse name: Not on file  . Number of children: 0  . Years of education: Not on file  . Highest education level: Not on file  Occupational History  . Not on file  Social Needs  . Financial resource strain: Not hard at all  . Food insecurity:    Worry: Never true    Inability: Never true  . Transportation needs:    Medical: No    Non-medical: No  Tobacco Use  . Smoking status: Never Smoker  . Smokeless tobacco: Never Used  Substance and Sexual Activity  . Alcohol use: No    Alcohol/week: 0.0 standard drinks  . Drug use: No  . Sexual activity: Never  Lifestyle  . Physical activity:    Days per week: Not on file    Minutes per session: Not on file  . Stress:  Not at all  Relationships  . Social connections:    Talks on phone: Not on file    Gets together: Not on file    Attends religious service: Not on file    Active member of club or organization: Not on file    Attends meetings of clubs or organizations: Not on file    Relationship status: Not on file  Other Topics Concern  . Not on file  Social History Narrative  . Not on file    Outpatient Encounter Medications as of 01/20/2019  Medication Sig  . aspirin 81 MG tablet Take 81 mg by mouth daily.  Marland Kitchen atenolol (TENORMIN) 25 MG tablet Take 12.5 mg by mouth daily  . benazepril  (LOTENSIN) 20 MG tablet Take 1 tablet (20 mg total) by mouth daily.  . Calcium-Phosphorus-Vitamin D (CITRACAL +D3 PO) Take 1 tablet by mouth daily.  . chlorzoxazone (PARAFON) 500 MG tablet One tablet q hs prn  . Cholecalciferol (D3-1000 PO) Take 1,000 Units by mouth daily.   . cholestyramine (QUESTRAN) 4 g packet Take 4 g by mouth daily as needed.   . Cyanocobalamin (B-12) 1000 MCG/ML KIT Inject 1,000 mcg as directed every 30 (thirty) days.   Marland Kitchen GLUCOSAMINE-CHONDROITIN PO Take 1 tablet by mouth 2 (two) times daily.   . Magnesium Oxide 400 (240 Mg) MG TABS Take 1 tablet (400 mg total) by mouth daily.  . Mesalamine (DELZICOL) 400 MG CPDR Take 400 mg by mouth 2 (two) times daily.   . pantoprazole (PROTONIX) 40 MG tablet Take 40 mg by mouth 2 (two) times daily.   Vladimir Faster Glycol-Propyl Glycol (SYSTANE OP) Take 1 drop by mouth daily as needed (for dry eyes).  . SYNTHROID 75 MCG tablet TAKE ONE TABLET DAILY BEFORE BREAKFAST.  Marland Kitchen Wheat Dextrin (BENEFIBER DRINK MIX) PACK Take 1 each by mouth daily.   Marland Kitchen zolpidem (AMBIEN CR) 6.25 MG CR tablet TAKE ONE TABLET AT BED- TIME IF NEEDED FOR SLEEP.  . [DISCONTINUED] chlorzoxazone (PARAFON) 500 MG tablet TAKE ONE TABLET AT BED- TIME AS NEEDED.  . [DISCONTINUED] Magnesium Oxide 400 (240 Mg) MG TABS TAKE 1 TABLET DAILY.   No facility-administered encounter medications on file as of 01/20/2019.     Review of Systems  Constitutional: Negative for appetite change and unexpected weight change.  HENT: Negative for congestion and sinus pressure.   Respiratory: Negative for cough, chest tightness and shortness of breath.   Cardiovascular: Negative for chest pain, palpitations and leg swelling.  Gastrointestinal: Negative for abdominal pain, nausea and vomiting.       Alternating diarrhea and constipation - adjusting cholestyramine doses.    Genitourinary: Negative for difficulty urinating and dysuria.  Musculoskeletal: Negative for joint swelling and myalgias.    Skin: Negative for color change and rash.  Neurological: Negative for dizziness, light-headedness and headaches.  Psychiatric/Behavioral: Negative for agitation and dysphoric mood.       Objective:    Physical Exam Constitutional:      General: She is not in acute distress.    Appearance: Normal appearance.  HENT:     Nose: Nose normal. No congestion.     Mouth/Throat:     Pharynx: No oropharyngeal exudate or posterior oropharyngeal erythema.  Neck:     Musculoskeletal: Neck supple. No muscular tenderness.     Thyroid: No thyromegaly.  Cardiovascular:     Rate and Rhythm: Normal rate and regular rhythm.  Pulmonary:     Effort: No respiratory distress.  Breath sounds: Normal breath sounds. No wheezing.  Abdominal:     General: Bowel sounds are normal.     Palpations: Abdomen is soft.     Tenderness: There is no abdominal tenderness.  Musculoskeletal:        General: No swelling or tenderness.  Lymphadenopathy:     Cervical: No cervical adenopathy.  Skin:    Findings: No erythema or rash.  Neurological:     Mental Status: She is alert.  Psychiatric:        Mood and Affect: Mood normal.        Behavior: Behavior normal.     BP 128/76 (BP Location: Left Arm, Patient Position: Sitting, Cuff Size: Normal)   Pulse 68   Temp 97.6 F (36.4 C) (Oral)   Resp 16   Wt 166 lb 9.6 oz (75.6 kg)   SpO2 97%   BMI 32.54 kg/m  Wt Readings from Last 3 Encounters:  01/20/19 166 lb 9.6 oz (75.6 kg)  01/13/19 160 lb (72.6 kg)  09/12/18 161 lb 12.8 oz (73.4 kg)     Lab Results  Component Value Date   WBC 4.0 06/04/2018   HGB 12.3 06/04/2018   HCT 36.9 06/04/2018   PLT 195.0 06/04/2018   GLUCOSE 97 09/12/2018   CHOL 156 09/12/2018   TRIG 99.0 09/12/2018   HDL 43.80 09/12/2018   LDLCALC 93 09/12/2018   ALT 12 10/13/2018   AST 13 10/13/2018   NA 138 09/12/2018   K 4.4 09/12/2018   CL 104 09/12/2018   CREATININE 0.99 09/12/2018   BUN 27 (H) 09/12/2018   CO2 25  09/12/2018   TSH 4.46 06/04/2018   INR 0.89 06/16/2018   HGBA1C 5.6 09/12/2018       Assessment & Plan:   Problem List Items Addressed This Visit    Abnormal liver function tests    Worked up by GI.  Diagnosed with autoimmune hepatitis.  Follow liver function tests.        Anemia    Previously evaluated by hematology.  Found to be iron deficient and B12 deficient and MGUS.  Follow cbc.       Autoimmune hepatitis ([place])    Being followed by GI.  Liver function tests wnl.  S/p cholecystectomy for gallbladder polyp.  Follow.        B12 deficiency    Continue B12 injections.       Barrett's esophagus with esophagitis    Followed by GI.  Upper symptoms controlled.        Hyperglycemia    Low carb diet and exercise.  Follow met b and a1c.        Relevant Orders   Hemoglobin A1c   Hypertension    Blood pressure under good control.  Continue same medication regimen.  Follow pressures.  Follow metabolic panel.        Relevant Orders   Hepatic function panel   Lipid panel   Basic metabolic panel   Hypothyroidism    On thyroid replacement.  Follow tsh.       Relevant Orders   TSH   Leg cramps    Discussed stretches and staying hydrated.  Confirm electrolytes and magnesium wnl.        Relevant Medications   chlorzoxazone (PARAFON) 500 MG tablet   Other Relevant Orders   Magnesium   MGUS (monoclonal gammopathy of unknown significance)    Was worked up by hematology.  Last check - negative M spike.  Felt no further hematology w/up warranted.        Relevant Orders   Protein Electrophoresis, (serum)       Einar Pheasant, MD

## 2019-01-26 ENCOUNTER — Encounter: Payer: Self-pay | Admitting: Internal Medicine

## 2019-01-26 NOTE — Assessment & Plan Note (Signed)
Worked up by GI.  Diagnosed with autoimmune hepatitis.  Follow liver function tests.

## 2019-01-26 NOTE — Assessment & Plan Note (Signed)
Previously evaluated by hematology.  Found to be iron deficient and B12 deficient and MGUS.  Follow cbc.

## 2019-01-26 NOTE — Assessment & Plan Note (Signed)
Low carb diet and exercise.  Follow met b and a1c.   

## 2019-01-26 NOTE — Assessment & Plan Note (Signed)
Was worked up by hematology.  Last check - negative M spike.  Felt no further hematology w/up warranted.

## 2019-01-26 NOTE — Assessment & Plan Note (Signed)
Continue B12 injections.   

## 2019-01-26 NOTE — Assessment & Plan Note (Signed)
Blood pressure under good control.  Continue same medication regimen.  Follow pressures.  Follow metabolic panel.   

## 2019-01-26 NOTE — Assessment & Plan Note (Signed)
On thyroid replacement.  Follow tsh.  

## 2019-01-26 NOTE — Assessment & Plan Note (Signed)
Being followed by GI.  Liver function tests wnl.  S/p cholecystectomy for gallbladder polyp.  Follow.

## 2019-01-26 NOTE — Assessment & Plan Note (Signed)
Discussed stretches and staying hydrated.  Confirm electrolytes and magnesium wnl.

## 2019-01-26 NOTE — Assessment & Plan Note (Signed)
Followed by GI.  Upper symptoms controlled.   

## 2019-02-02 ENCOUNTER — Other Ambulatory Visit: Payer: Self-pay | Admitting: Internal Medicine

## 2019-02-09 ENCOUNTER — Other Ambulatory Visit: Payer: Self-pay | Admitting: Internal Medicine

## 2019-02-09 NOTE — Telephone Encounter (Signed)
Refilled: 12/11/2018 Last OV: 01/20/2019 Next OV: 05/22/2019

## 2019-02-10 ENCOUNTER — Encounter (INDEPENDENT_AMBULATORY_CARE_PROVIDER_SITE_OTHER): Payer: Self-pay

## 2019-02-10 ENCOUNTER — Ambulatory Visit (INDEPENDENT_AMBULATORY_CARE_PROVIDER_SITE_OTHER): Payer: Medicare Other

## 2019-02-10 DIAGNOSIS — E538 Deficiency of other specified B group vitamins: Secondary | ICD-10-CM | POA: Diagnosis not present

## 2019-02-10 MED ORDER — CYANOCOBALAMIN 1000 MCG/ML IJ SOLN
1000.0000 ug | Freq: Once | INTRAMUSCULAR | Status: AC
  Administered 2019-02-10: 1000 ug via INTRAMUSCULAR

## 2019-02-10 NOTE — Progress Notes (Addendum)
Patient presented for B 12 injection to left deltoid, patient voiced no concerns nor showed any signs of distress during injection.  Reviewed.  Dr Scott 

## 2019-02-10 NOTE — Telephone Encounter (Signed)
rx ok'd for ambien #30 with one refill.   

## 2019-02-11 ENCOUNTER — Other Ambulatory Visit: Payer: Self-pay | Admitting: Gastroenterology

## 2019-02-11 DIAGNOSIS — K7460 Unspecified cirrhosis of liver: Secondary | ICD-10-CM | POA: Diagnosis not present

## 2019-02-11 DIAGNOSIS — K754 Autoimmune hepatitis: Secondary | ICD-10-CM

## 2019-02-11 DIAGNOSIS — K7469 Other cirrhosis of liver: Secondary | ICD-10-CM

## 2019-02-16 ENCOUNTER — Other Ambulatory Visit: Payer: Self-pay | Admitting: Internal Medicine

## 2019-03-17 ENCOUNTER — Other Ambulatory Visit: Payer: Self-pay

## 2019-03-17 ENCOUNTER — Ambulatory Visit (INDEPENDENT_AMBULATORY_CARE_PROVIDER_SITE_OTHER): Payer: Medicare Other

## 2019-03-17 DIAGNOSIS — E538 Deficiency of other specified B group vitamins: Secondary | ICD-10-CM

## 2019-03-17 MED ORDER — CYANOCOBALAMIN 1000 MCG/ML IJ SOLN
1000.0000 ug | Freq: Once | INTRAMUSCULAR | Status: AC
  Administered 2019-03-17: 1000 ug via INTRAMUSCULAR

## 2019-03-17 NOTE — Progress Notes (Addendum)
Lori [name] presents today for injection per MD orders. B12 injection administered IM in right Upper Arm. Administration without incident. Patient tolerated well. Mitchel Delduca,cma  Reviewed.  Dr Nicki Reaper

## 2019-03-30 ENCOUNTER — Ambulatory Visit: Payer: Medicare Other

## 2019-04-06 ENCOUNTER — Other Ambulatory Visit: Payer: Self-pay | Admitting: Internal Medicine

## 2019-04-14 ENCOUNTER — Other Ambulatory Visit: Payer: Self-pay | Admitting: Internal Medicine

## 2019-04-14 NOTE — Telephone Encounter (Signed)
Last OV 01/20/2019  Last refilled 02/10/2019 disp 30 with 1 refill   Next OV 05/22/2019  Sent to PCP for approval

## 2019-04-14 NOTE — Telephone Encounter (Signed)
rx ok'd for ambien #30 with one refill.   

## 2019-04-16 ENCOUNTER — Ambulatory Visit: Payer: Medicare Other

## 2019-04-21 ENCOUNTER — Ambulatory Visit (INDEPENDENT_AMBULATORY_CARE_PROVIDER_SITE_OTHER): Payer: Medicare Other

## 2019-04-21 ENCOUNTER — Other Ambulatory Visit: Payer: Self-pay

## 2019-04-21 DIAGNOSIS — E538 Deficiency of other specified B group vitamins: Secondary | ICD-10-CM | POA: Diagnosis not present

## 2019-04-21 MED ORDER — CYANOCOBALAMIN 1000 MCG/ML IJ SOLN
1000.0000 ug | Freq: Once | INTRAMUSCULAR | Status: AC
  Administered 2019-04-21: 1000 ug via INTRAMUSCULAR

## 2019-04-21 NOTE — Progress Notes (Addendum)
Patient presented today for B12 injection.  Administered IM in left deltoid.  Patient tolerated well.  Butch Penny, CMA  Reviewed.  Dr Nicki Reaper

## 2019-05-07 DIAGNOSIS — R001 Bradycardia, unspecified: Secondary | ICD-10-CM | POA: Diagnosis not present

## 2019-05-07 DIAGNOSIS — I491 Atrial premature depolarization: Secondary | ICD-10-CM | POA: Diagnosis not present

## 2019-05-07 DIAGNOSIS — R9431 Abnormal electrocardiogram [ECG] [EKG]: Secondary | ICD-10-CM | POA: Diagnosis not present

## 2019-05-07 DIAGNOSIS — I10 Essential (primary) hypertension: Secondary | ICD-10-CM | POA: Diagnosis not present

## 2019-05-12 ENCOUNTER — Ambulatory Visit
Admission: RE | Admit: 2019-05-12 | Discharge: 2019-05-12 | Disposition: A | Discharge status: Home / Self Care | Payer: Medicare Other | Source: Ambulatory Visit | Attending: Gastroenterology | Admitting: Gastroenterology

## 2019-05-12 ENCOUNTER — Other Ambulatory Visit: Payer: Self-pay

## 2019-05-12 DIAGNOSIS — K7469 Other cirrhosis of liver: Secondary | ICD-10-CM | POA: Insufficient documentation

## 2019-05-12 DIAGNOSIS — K7460 Unspecified cirrhosis of liver: Secondary | ICD-10-CM | POA: Diagnosis not present

## 2019-05-18 ENCOUNTER — Other Ambulatory Visit: Payer: Self-pay

## 2019-05-18 ENCOUNTER — Other Ambulatory Visit (INDEPENDENT_AMBULATORY_CARE_PROVIDER_SITE_OTHER): Payer: Medicare Other

## 2019-05-18 DIAGNOSIS — R252 Cramp and spasm: Secondary | ICD-10-CM | POA: Diagnosis not present

## 2019-05-18 DIAGNOSIS — E039 Hypothyroidism, unspecified: Secondary | ICD-10-CM | POA: Diagnosis not present

## 2019-05-18 DIAGNOSIS — D472 Monoclonal gammopathy: Secondary | ICD-10-CM | POA: Diagnosis not present

## 2019-05-18 DIAGNOSIS — I10 Essential (primary) hypertension: Secondary | ICD-10-CM

## 2019-05-18 DIAGNOSIS — R739 Hyperglycemia, unspecified: Secondary | ICD-10-CM

## 2019-05-18 LAB — LIPID PANEL
Cholesterol: 173 mg/dL (ref 0–200)
HDL: 42.6 mg/dL (ref >39.00)
LDL Cholesterol: 101 mg/dL — ABNORMAL HIGH (ref 0–99)
NonHDL: 130.56
Total CHOL/HDL Ratio: 4
Triglycerides: 149 mg/dL (ref 0.0–149.0)
VLDL: 29.8 mg/dL (ref 0.0–40.0)

## 2019-05-18 LAB — HEPATIC FUNCTION PANEL
ALT: 9 U/L (ref 0–35)
AST: 12 U/L (ref 0–37)
Albumin: 4.2 g/dL (ref 3.5–5.2)
Alkaline Phosphatase: 50 U/L (ref 39–117)
Bilirubin, Direct: 0.2 mg/dL (ref 0.0–0.3)
Total Bilirubin: 1.5 mg/dL — ABNORMAL HIGH (ref 0.2–1.2)
Total Protein: 7.6 g/dL (ref 6.0–8.3)

## 2019-05-18 LAB — BASIC METABOLIC PANEL
BUN: 28 mg/dL — ABNORMAL HIGH (ref 6–23)
CO2: 28 mEq/L (ref 19–32)
Calcium: 9.6 mg/dL (ref 8.4–10.5)
Chloride: 103 mEq/L (ref 96–112)
Creatinine, Ser: 1.11 mg/dL (ref 0.40–1.20)
GFR: 46.99 mL/min — ABNORMAL LOW (ref >60.00)
Glucose, Bld: 97 mg/dL (ref 70–99)
Potassium: 4 mEq/L (ref 3.5–5.1)
Sodium: 139 mEq/L (ref 135–145)

## 2019-05-18 LAB — HEMOGLOBIN A1C: Hgb A1c MFr Bld: 5.9 % (ref 4.6–6.5)

## 2019-05-18 LAB — TSH: TSH: 2.48 u[IU]/mL (ref 0.35–4.50)

## 2019-05-18 LAB — MAGNESIUM: Magnesium: 1.8 mg/dL (ref 1.5–2.5)

## 2019-05-20 LAB — PROTEIN ELECTROPHORESIS, SERUM
Albumin ELP: 4 g/dL (ref 3.8–4.8)
Alpha 1: 0.3 g/dL (ref 0.2–0.3)
Alpha 2: 0.8 g/dL (ref 0.5–0.9)
Beta 2: 0.5 g/dL (ref 0.2–0.5)
Beta Globulin: 0.5 g/dL (ref 0.4–0.6)
Gamma Globulin: 1.5 g/dL (ref 0.8–1.7)
Total Protein: 7.5 g/dL (ref 6.1–8.1)

## 2019-05-22 ENCOUNTER — Ambulatory Visit (INDEPENDENT_AMBULATORY_CARE_PROVIDER_SITE_OTHER): Payer: Medicare Other | Admitting: Internal Medicine

## 2019-05-22 ENCOUNTER — Other Ambulatory Visit: Payer: Self-pay

## 2019-05-22 ENCOUNTER — Encounter: Payer: Self-pay | Admitting: Internal Medicine

## 2019-05-22 DIAGNOSIS — K209 Esophagitis, unspecified without bleeding: Secondary | ICD-10-CM

## 2019-05-22 DIAGNOSIS — Z1231 Encounter for screening mammogram for malignant neoplasm of breast: Secondary | ICD-10-CM

## 2019-05-22 DIAGNOSIS — R739 Hyperglycemia, unspecified: Secondary | ICD-10-CM | POA: Diagnosis not present

## 2019-05-22 DIAGNOSIS — I10 Essential (primary) hypertension: Secondary | ICD-10-CM

## 2019-05-22 DIAGNOSIS — R7989 Other specified abnormal findings of blood chemistry: Secondary | ICD-10-CM

## 2019-05-22 DIAGNOSIS — R252 Cramp and spasm: Secondary | ICD-10-CM

## 2019-05-22 DIAGNOSIS — E039 Hypothyroidism, unspecified: Secondary | ICD-10-CM | POA: Diagnosis not present

## 2019-05-22 DIAGNOSIS — N289 Disorder of kidney and ureter, unspecified: Secondary | ICD-10-CM

## 2019-05-22 DIAGNOSIS — R945 Abnormal results of liver function studies: Secondary | ICD-10-CM

## 2019-05-22 DIAGNOSIS — E538 Deficiency of other specified B group vitamins: Secondary | ICD-10-CM | POA: Diagnosis not present

## 2019-05-22 DIAGNOSIS — Z1239 Encounter for other screening for malignant neoplasm of breast: Secondary | ICD-10-CM | POA: Diagnosis not present

## 2019-05-22 DIAGNOSIS — K754 Autoimmune hepatitis: Secondary | ICD-10-CM

## 2019-05-22 DIAGNOSIS — D649 Anemia, unspecified: Secondary | ICD-10-CM

## 2019-05-22 DIAGNOSIS — K2270 Barrett's esophagus without dysplasia: Secondary | ICD-10-CM

## 2019-05-22 MED ORDER — BENAZEPRIL HCL 20 MG PO TABS: 20.0000 mg | ORAL_TABLET | Freq: Every day | ORAL | 1 refills | Status: DC

## 2019-05-22 MED ORDER — MAGNESIUM OXIDE 400 MG PO TABS: 400.0000 mg | ORAL_TABLET | Freq: Two times a day (BID) | ORAL | 0 refills | Status: DC

## 2019-05-22 NOTE — Progress Notes (Signed)
Patient ID: Lori [name], female   DOB: [birth year], 85 y.o.   MRN: [id number]   Virtual Visit via telephone Note  This visit type was conducted due to national recommendations for restrictions regarding the COVID-19 pandemic (e.g. social distancing).  This format is felt to be most appropriate for this patient at this time.  All issues noted in this document were discussed and addressed.  No physical exam was performed (except for noted visual exam findings with Video Visits).   I connected with Lori [name] by telephone and verified that I am speaking with the correct person using two identifiers. Location patient: home Location provider: work  Persons participating in the telephone visit: patient, provider  I discussed the limitations, risks, security and privacy concerns of performing an evaluation and management service by telephone and the availability of in person appointments. The patient expressed understanding and agreed to proceed.   Reason for visit: scheduled follow up.   HPI: Reports recently having increased cramps.  States her feet started cramping - both feet.  Then noticed hands cramping.  Took parafon forte.  Developed cramping in her legs.  Later that evening applied biofreeze and took another parafon forte.  Slept.  When she woke up, cramps better.  Tries to stay active.  No chest pain.  No sob. No acid reflux.  Still has flares with her bowels.  Occurs every third or fourth day.  Being followed by GI.  Just had f/u ultrasound.  Due to f/u with GI.  Eating well.  No nausea or vomiting.  Discussed labs.  Magnesium 1.8.  States blood pressures averaging 130s/70s.     ROS: See pertinent positives and negatives per HPI.  Past Medical History:  Diagnosis Date  . Anemia    iron deficient  . Arthritis   . Autoimmune hepatitis ([place]) 05/10/2016   Hep C  . B12 deficiency   . Barrett esophagus 01/2016  . Bradycardia   . Diverticulosis   . Fibrocystic breast disease    . GERD (gastroesophageal reflux disease)   . Hypertension   . Hypothyroidism   . Ulcer disease     Past Surgical History:  Procedure Laterality Date  . CHOLECYSTECTOMY N/A 06/25/2018   Procedure: LAPAROSCOPIC CHOLECYSTECTOMY WITH INTRAOPERATIVE CHOLANGIOGRAM;  Surgeon: Robert Bellow, MD;  Location: [facility] ORS;  Service: General;  Laterality: N/A;  . ESOPHAGOGASTRODUODENOSCOPY (EGD) WITH PROPOFOL N/A 01/30/2016   Procedure: ESOPHAGOGASTRODUODENOSCOPY (EGD) WITH PROPOFOL;  Surgeon: Lollie Sails, MD;  [facility];  Service: Endoscopy;  Laterality: N/A;  . ESOPHAGOGASTRODUODENOSCOPY (EGD) WITH PROPOFOL N/A 01/14/2018   Procedure: ESOPHAGOGASTRODUODENOSCOPY (EGD) WITH PROPOFOL;  Surgeon: Lollie Sails, MD;  [facility];  Service: Endoscopy;  Laterality: N/A;  . ESOPHAGOGASTRODUODENOSCOPY (EGD) WITH PROPOFOL N/A 05/20/2018   Procedure: ESOPHAGOGASTRODUODENOSCOPY (EGD) WITH PROPOFOL;  Surgeon: Lollie Sails, MD;  Location: [facility] END[facility]COPY;  Service: Endoscopy;  Laterality: N/A;  . ESOPHAGOGASTRODUODENOSCOPY (EGD) WITH PROPOFOL N/A 01/13/2019   Procedure: ESOPHAGOGASTRODUODENOSCOPY (EGD) WITH PROPOFOL;  Surgeon: Lollie Sails, MD;  Location: [facility] ENDOSCOPY;  Service: Endoscopy;  Laterality: N/A;  . HERNIA REPAIR    . lap hiatal hernia repair  07/08/08  . LEFT OOPHORECTOMY  1993   benign ovarian cyst  . TUBAL LIGATION  1981    Family History  Problem Relation Age of Onset  . Stroke Mother   . Breast cancer Neg Hx   . Colon cancer Neg Hx     SOCIAL HX: reviewed.    Current Outpatient  Medications:  .  aspirin 81 MG tablet, Take 81 mg by mouth daily., Disp: , Rfl:  .  atenolol (TENORMIN) 25 MG tablet, Take 12.5 mg by mouth daily, Disp: 90 tablet, Rfl: 1 .  benazepril (LOTENSIN) 20 MG tablet, Take 1 tablet (20 mg total) by mouth daily., Disp: 90 tablet, Rfl: 1 .  calcitonin, salmon, (MIACALCIN/FORTICAL) 200 UNIT/ACT nasal spray, USE 1 SPRAY IN ONE  NOSTRIL DAILY. ALTERNATE NOSTRILS., Disp: 3.7 mL, Rfl: 0 .  Calcium-Phosphorus-Vitamin D (CITRACAL +D3 PO), Take 1 tablet by mouth daily., Disp: , Rfl:  .  chlorzoxazone (PARAFON) 500 MG tablet, One tablet q hs prn, Disp: 30 tablet, Rfl: 1 .  Cholecalciferol (D3-1000 PO), Take 1,000 Units by mouth daily. , Disp: , Rfl:  .  cholestyramine (QUESTRAN) 4 g packet, Take 4 g by mouth daily as needed. , Disp: , Rfl:  .  Cyanocobalamin (B-12) 1000 MCG/ML KIT, Inject 1,000 mcg as directed every 30 (thirty) days. , Disp: , Rfl:  .  GLUCOSAMINE-CHONDROITIN PO, Take 1 tablet by mouth 2 (two) times daily. , Disp: , Rfl:  .  Mesalamine (DELZICOL) 400 MG CPDR, Take 400 mg by mouth 2 (two) times daily. , Disp: , Rfl:  .  pantoprazole (PROTONIX) 40 MG tablet, Take 40 mg by mouth 2 (two) times daily. , Disp: , Rfl:  .  Polyethyl Glycol-Propyl Glycol (SYSTANE OP), Take 1 drop by mouth daily as needed (for dry eyes)., Disp: , Rfl:  .  SYNTHROID 75 MCG tablet, TAKE ONE TABLET DAILY BEFORE BREAKFAST., Disp: 90 tablet, Rfl: 1 .  Wheat Dextrin (BENEFIBER DRINK MIX) PACK, Take 1 each by mouth daily. , Disp: , Rfl:  .  zolpidem (AMBIEN CR) 6.25 MG CR tablet, TAKE ONE TABLET AT BED- TIME IF NEEDED FOR SLEEP., Disp: 30 tablet, Rfl: 1 .  magnesium oxide (MAG-OX) 400 MG tablet, Take 1 tablet (400 mg total) by mouth 2 (two) times daily., Disp: 180 tablet, Rfl: 0  EXAM:  GENERAL: alert.  Answering questions appropriately.  Sounds to be in no acute distress.    PSYCH/NEURO: pleasant and cooperative, no obvious depression or anxiety, speech and thought processing grossly intact  ASSESSMENT AND PLAN:  Discussed the following assessment and plan:  Visit for screening mammogram - Plan: MM 3D SCREEN BREAST BILATERAL  Breast cancer screening  Abnormal liver function tests  Anemia, unspecified type  Autoimmune hepatitis ([place])  B12 deficiency  Barrett's esophagus with esophagitis  Hyperbilirubinemia  Hyperglycemia   Essential hypertension  Hypothyroidism, unspecified type  Leg cramps  Renal insufficiency - Plan: Basic metabolic panel  Hypomagnesemia - Plan: Magnesium  Abnormal liver function tests Worked up and followed by GI.  Just had f/u ultrasound.  Follow liver function tests. Bilirubin slightly elevated.  AST/ALT wnl.   Anemia Previously evaluated by hematology.  Previously found to be iron deficient and B12 deficient and MGUS.  Follow cbc.   Autoimmune hepatitis ([place]) Being followed by GI.  Just had f/u ultrasound.  Follow liver function tests.   B12 deficiency Continue B12 injections.    Barrett's esophagus with esophagitis Followed by GI.  Upper symptoms controlled.    Hyperbilirubinemia Followed by GI.   Hyperglycemia Low carb diet and exercise.  Follow met b and a1c.   Hypertension Blood pressure under good control.  Continue same medication regimen.  Follow pressures.  Follow metabolic panel.    Hypothyroidism On thyroid replacement.  Follow tsh.   Leg cramps Cramps as outlined.  Increase magnesium  to bid.  Follow.    Renal insufficiency Stay hydrated.  Follow renal function.     I discussed the assessment and treatment plan with the patient. The patient was provided an opportunity to ask questions and all were answered. The patient agreed with the plan and demonstrated an understanding of the instructions.   The patient was advised to call back or seek an in-person evaluation if the symptoms worsen or if the condition fails to improve as anticipated.  I provided 23 minutes of non-face-to-face time during this encounter.   Einar Pheasant, MD

## 2019-05-24 ENCOUNTER — Encounter: Payer: Self-pay | Admitting: Internal Medicine

## 2019-05-24 NOTE — Assessment & Plan Note (Signed)
Low carb diet and exercise.  Follow met b and a1c.  

## 2019-05-24 NOTE — Assessment & Plan Note (Signed)
On thyroid replacement.  Follow tsh.  

## 2019-05-24 NOTE — Assessment & Plan Note (Signed)
Worked up and followed by GI.  Just had f/u ultrasound.  Follow liver function tests. Bilirubin slightly elevated.  AST/ALT wnl.

## 2019-05-24 NOTE — Assessment & Plan Note (Signed)
Stay hydrated.  Follow renal function.

## 2019-05-24 NOTE — Assessment & Plan Note (Signed)
Blood pressure under good control.  Continue same medication regimen.  Follow pressures.  Follow metabolic panel.   

## 2019-05-24 NOTE — Assessment & Plan Note (Signed)
Cramps as outlined.  Increase magnesium to bid.  Follow.

## 2019-05-24 NOTE — Assessment & Plan Note (Signed)
Continue B12 injections.   

## 2019-05-24 NOTE — Assessment & Plan Note (Signed)
Previously evaluated by hematology.  Previously found to be iron deficient and B12 deficient and MGUS.  Follow cbc.

## 2019-05-24 NOTE — Assessment & Plan Note (Signed)
Followed by GI.  Upper symptoms controlled.   

## 2019-05-24 NOTE — Assessment & Plan Note (Signed)
Followed by GI

## 2019-05-24 NOTE — Assessment & Plan Note (Signed)
Being followed by GI.  Just had f/u ultrasound.  Follow liver function tests.

## 2019-05-26 ENCOUNTER — Ambulatory Visit (INDEPENDENT_AMBULATORY_CARE_PROVIDER_SITE_OTHER): Payer: Medicare Other

## 2019-05-26 ENCOUNTER — Other Ambulatory Visit: Payer: Self-pay

## 2019-05-26 DIAGNOSIS — E538 Deficiency of other specified B group vitamins: Secondary | ICD-10-CM

## 2019-05-26 MED ORDER — CYANOCOBALAMIN 1000 MCG/ML IJ SOLN
1000.0000 ug | Freq: Once | INTRAMUSCULAR | Status: AC
  Administered 2019-05-26: 1000 ug via INTRAMUSCULAR

## 2019-05-26 NOTE — Progress Notes (Addendum)
Patient presented today for B12 injection.  Administered IM in right deltoid.  Patient tolerated well with no signs of distress.  Reviewed.  Dr Scott    

## 2019-06-01 ENCOUNTER — Other Ambulatory Visit: Payer: Self-pay

## 2019-06-01 ENCOUNTER — Ambulatory Visit (INDEPENDENT_AMBULATORY_CARE_PROVIDER_SITE_OTHER): Payer: Medicare Other

## 2019-06-01 DIAGNOSIS — Z0000 Encounter for general adult medical examination without abnormal findings: Secondary | ICD-10-CM | POA: Diagnosis not present

## 2019-06-01 NOTE — Patient Instructions (Addendum)
  Ms. Everly , Thank you for taking time to come for your Medicare Wellness Visit. I appreciate your ongoing commitment to your health goals. Please review the following plan we discussed and let me know if I can assist you in the future.   These are the goals we discussed: Goals    . Healthy Lifestyle     Walk for exercise Stay hydrated Healthy foods       This is a list of the screening recommended for you and due dates:  Health Maintenance  Topic Date Due  . Tetanus Vaccine  10/01/1955  . Mammogram  05/23/2019  . Flu Shot  08/01/2019  . DEXA scan (bone density measurement)  Completed  . Pneumonia vaccines  Completed

## 2019-06-01 NOTE — Progress Notes (Signed)
Subjective:   Lori [name] is a 85 y.o. female who presents for Medicare Annual (Subsequent) preventive examination.  Review of Systems:  No ROS.  Medicare Wellness Virtual Visit.  Visual/audio telehealth visit, UTA vital signs.   See social history for additional risk factors.  Cardiac Risk Factors include: advanced age (>17mn, >>31women)    Objective:     Vitals: There were no vitals taken for this visit.  There is no height or weight on file to calculate BMI.  Advanced Directives 06/01/2019 01/13/2019 06/25/2018 06/16/2018 05/20/2018 03/27/2018 01/14/2018  Does Patient Have a Medical Advance Directive? No No Yes Yes - Yes Yes  Type of Advance Directive - - [place] - Living will Living will;Healthcare Power of [place]  Does patient want to make changes to medical advance directive? - - No - Patient declined - - No - Patient declined -  Copy of [place]? - - No - copy requested - - No - copy requested Yes  Would patient like information on creating a medical advance directive? Yes (MAU/Ambulatory/Procedural Areas - Information given) No - Patient declined - - - - -    Tobacco Social History   Tobacco Use  Smoking Status Never Smoker  Smokeless Tobacco Never Used     Counseling given: Not Answered   Clinical Intake:  Pre-visit preparation completed: Yes        Diabetes: No  How often do you need to have someone help you when you read instructions, pamphlets, or other written materials from your doctor or pharmacy?: 1 - Never  Interpreter Needed?: No     Past Medical History:  Diagnosis Date  . Anemia    iron deficient  . Arthritis   . [place]immune [place] 05[place]   Hep C  . B12 deficiency   . Barrett esophagus 01/2016  . Bradycardia   . Diverticulosis   . Fibrocystic breast disease   . GERD (gastroesophageal reflux disease)   .  Hypertension   . Hypothyroidism   . Ulcer disease    Past Surgical History:  Procedure Laterality Date  . CHOLECYSTECTOMY N/A 06/25/2018   Procedure: LAPAROSCOPIC CHOLECYSTECTOMY WITH INTRAOPERATIVE CHOLANGIOGRAM;  Surgeon: BRobert Bellow MD;  Location: [facility] ORS;  Service: General;  Laterality: N/A;  . ESOPHAGOGASTRODUODENOSCOPY (EGD) WITH PROPOFOL N/A 01/30/2016   Procedure: ESOPHAGOGASTRODUODENOSCOPY (EGD) WITH PROPOFOL;  Surgeon: MLollie Sails MD;  [facility];  Service: Endoscopy;  Laterality: N/A;  . ESOPHAGOGASTRODUODENOSCOPY (EGD) WITH PROPOFOL N/A 01/14/2018   Procedure: ESOPHAGOGASTRODUODENOSCOPY (EGD) WITH PROPOFOL;  Surgeon: SLollie Sails MD;  [facility];  Service: Endoscopy;  Laterality: N/A;  . ESOPHAGOGASTRODUODENOSCOPY (EGD) WITH PROPOFOL N/A 05/20/2018   Procedure: ESOPHAGOGASTRODUODENOSCOPY (EGD) WITH PROPOFOL;  Surgeon: SLollie Sails MD;  [facility];  Service: Endoscopy;  Laterality: N/A;  . ESOPHAGOGASTRODUODENOSCOPY (EGD) WITH PROPOFOL N/A 01/13/2019   Procedure: ESOPHAGOGASTRODUODENOSCOPY (EGD) WITH PROPOFOL;  Surgeon: SLollie Sails MD;  [facility];  Service: Endoscopy;  Laterality: N/A;  . HERNIA REPAIR    . lap hiatal hernia repair  07/08/08  . LEFT OOPHORECTOMY  1993   benign ovarian cyst  . TUBAL LIGATION  1981   Family History  Problem Relation Age of Onset  . Stroke Mother   . Breast cancer Neg Hx   . Colon cancer Neg Hx    Social History   Socioeconomic History  . Marital status: Widowed  Spouse name: Not on file  . Number of children: 0  . Years of education: Not on file  . Highest education level: Not on file  Occupational History  . Not on file  Social Needs  . Financial resource strain: Not hard at all  . Food insecurity:    Worry: Never true    Inability: Never true  . Transportation needs:    Medical: No    Non-medical: No  Tobacco Use  . Smoking status: Never  Smoker  . Smokeless tobacco: Never Used  Substance and Sexual Activity  . Alcohol use: No    Alcohol/week: 0.0 standard drinks  . Drug use: No  . Sexual activity: Never  Lifestyle  . Physical activity:    Days per week: 3 days    Minutes per session: 20 min  . Stress: Not at all  Relationships  . Social connections:    Talks on phone: Not on file    Gets together: Not on file    Attends religious service: Not on file    Active member of club or organization: Not on file    Attends meetings of clubs or organizations: Not on file    Relationship status: Not on file  Other Topics Concern  . Not on file  Social History Narrative  . Not on file    Outpatient Encounter Medications as of 06/01/2019  Medication Sig  . aspirin 81 MG tablet Take 81 mg by mouth daily.  Marland Kitchen atenolol (TENORMIN) 25 MG tablet Take 12.5 mg by mouth daily  . benazepril (LOTENSIN) 20 MG tablet Take 1 tablet (20 mg total) by mouth daily.  . calcitonin, salmon, (MIACALCIN/FORTICAL) 200 UNIT/ACT nasal spray USE 1 SPRAY IN ONE NOSTRIL DAILY. ALTERNATE NOSTRILS.  Marland Kitchen Calcium-Phosphorus-Vitamin D (CITRACAL +D3 PO) Take 1 tablet by mouth daily.  . chlorzoxazone (PARAFON) 500 MG tablet One tablet q hs prn  . Cholecalciferol (D3-1000 PO) Take 1,000 Units by mouth daily.   . cholestyramine (QUESTRAN) 4 g packet Take 4 g by mouth daily as needed.   . Cyanocobalamin (B-12) 1000 MCG/ML KIT Inject 1,000 mcg as directed every 30 (thirty) days.   Marland Kitchen GLUCOSAMINE-CHONDROITIN PO Take 1 tablet by mouth 2 (two) times daily.   . magnesium oxide (MAG-OX) 400 MG tablet Take 1 tablet (400 mg total) by mouth 2 (two) times daily.  . Mesalamine (DELZICOL) 400 MG CPDR Take 400 mg by mouth 2 (two) times daily.   . pantoprazole (PROTONIX) 40 MG tablet Take 40 mg by mouth 2 (two) times daily.   Vladimir Faster Glycol-Propyl Glycol (SYSTANE OP) Take 1 drop by mouth daily as needed (for dry eyes).  . SYNTHROID 75 MCG tablet TAKE ONE TABLET DAILY BEFORE  BREAKFAST.  Marland Kitchen Wheat Dextrin (BENEFIBER DRINK MIX) PACK Take 1 each by mouth daily.   Marland Kitchen zolpidem (AMBIEN CR) 6.25 MG CR tablet TAKE ONE TABLET AT BED- TIME IF NEEDED FOR SLEEP.   No facility-administered encounter medications on file as of 06/01/2019.     Activities of Daily Living In your present state of health, do you have any difficulty performing the following activities: 06/01/2019 06/16/2018  Hearing? Y Y  Comment Hearing aids HOH has hearing aids both ears  Vision? N N  Difficulty concentrating or making decisions? N N  Walking or climbing stairs? N Y  Dressing or bathing? N N  Doing errands, shopping? N N  Preparing Food and eating ? N -  Using the Toilet? N -  In the past six months, have you accidently leaked urine? N -  Do you have problems with loss of bowel control? N -  Managing your Medications? N -  Managing your Finances? N -  Housekeeping or managing your Housekeeping? N -  Some recent data might be hidden    Patient Care Team: Einar Pheasant, MD as PCP - General (Internal Medicine)    Assessment:   This is a routine wellness examination for He[place].  I connected with patient 06/01/19 at 10:30 AM EDT by a video/audio enabled telemedicine application and verified that I am speaking with the correct person using two identifiers. Patient stated full name and DOB. Patient gave permission to continue with virtual visit. Patient's location was at home and Nurse's location was at [place] office.   Health Screenings  Mammogram - 04/2017. Ordered by pcp.  Colonoscopy - 12/2017 Bone Density - 01/2017 Glaucoma -none Hearing -hearing aids Hemoglobin A1C - 05/2019 Cholesterol - 05/2019 Dental- every 4 months Vision- visits within the last 12 months.  Social  Alcohol intake - no        Smoking history- never   Smokers in home? none Illicit drug use? none Exercise - walks up and down her street every other day Diet - no leafy green; monitors regular diet Sexually Active  -not currently BMI- discussed the importance of a healthy diet, water intake and the benefits of aerobic exercise.  Educational material provided.   Safety  Patient feels safe at home- yes Patient does have smoke detectors at home- yes Patient does wear sunscreen or protective clothing when in direct sunlight -yes Patient does wear seat belt when in a moving vehicle -yes Life alert- no. Information mailed per request.   Covid-19 precautions and sickness symptoms discussed.   Activities of Daily Living Patient denies needing assistance with: driving, household chores, feeding themselves, getting from bed to chair, getting to the toilet, bathing/showering, dressing, managing money, or preparing meals.  No new identified risk were noted.    Depression Screen Patient denies losing interest in daily life, feeling hopeless, or crying easily over simple problems.   Medication-taking as directed and without issues.   Fall Screen Patient denies falling in the last year. Avoids stairs with no railing.   Memory Screen Patient is alert.  Patient denies difficulty focusing, concentrating or misplacing items. Correctly identified the president of the [place], season and recall. 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  Immunizations The following Immunizations were discussed: Influenza, shingles, pneumonia, and tetanus.   Other Providers Patient Care Team: Einar Pheasant, MD as PCP - General (Internal Medicine)  Exercise Activities and Dietary recommendations Current Exercise Habits: Home exercise routine, Type of exercise: walking, Time (Minutes): 20, Frequency (Times/Week): 2, Weekly Exercise (Minutes/Week): 40, Intensity: Mild  Goals    . Healthy Lifestyle     Walk for exercise Stay hydrated Healthy foods       Fall Risk Fall Risk  05/22/2019 03/27/2018 03/26/2017 09/10/2016 03/26/2016  Falls in the past year? 0 No Yes No No  Number falls in past yr: 0 - 1 -  -  Injury with Fall? 0 - Yes - -  Comment - - Followed by PCP - -  Follow up Falls evaluation completed - Falls prevention discussed - -   Depression Screen PHQ 2/9 Scores 05/22/2019 03/27/2018 03/26/2017 09/10/2016  PHQ - 2 Score 0 0 0 0  PHQ- 9 Score 0 - - -  Cognitive Function MMSE - Mini Mental State Exam 03/26/2017 03/26/2016  Orientation to time 5 5  Orientation to Place 5 5  Registration 3 3  Attention/ Calculation 5 5  Recall 3 3  Language- name 2 objects 2 2  Language- repeat 1 1  Language- follow 3 step command 3 3  Language- read & follow direction 1 1  Write a sentence 1 1  Copy design 1 1  Total score 30 30     6CIT Screen 06/01/2019 03/27/2018  What Year? 0 points 0 points  What month? 0 points 0 points  What time? 0 points 0 points  Count back from 20 0 points 0 points  Months in reverse 0 points 0 points  Repeat phrase 0 points 0 points  Total Score 0 0    Immunization History  Administered Date(s) Administered  . Influenza, High Dose Seasonal PF 10/01/2017, 09/12/2018  . Influenza,inj,Quad PF,6+ Mos 08/31/2014, 09/07/2015, 08/21/2016  . Influenza-Unspecified 09/11/2012, 10/14/2013, 08/31/2014, 09/07/2015, 08/21/2016  . Pneumococcal Conjugate-13 03/08/2014, 03/02/2015  . Pneumococcal Polysaccharide-23 07/22/2008  . Zoster 12/31/2008  . Zoster Recombinat (Shingrix) 05/14/2018, 07/15/2018   Screening Tests Health Maintenance  Topic Date Due  . TETANUS/TDAP  10/01/1955  . MAMMOGRAM  05/23/2019  . INFLUENZA VACCINE  08/01/2019  . DEXA SCAN  Completed  . PNA vac Low Risk Adult  Completed      Plan:    End of life planning; Advance aging; Advanced directives discussed.  Copy of current HCPOA/Living Will requested upon completion.    I have personally reviewed and noted the following in the patient's chart:   . Medical and social history . Use of alcohol, tobacco or illicit drugs  . Current medications and supplements . Functional ability and status  . Nutritional status . Physical activity . Advanced directives . List of other physicians . Hospitalizations, surgeries, and ER visits in previous 12 months . Vitals . Screenings to include cognitive, depression, and falls . Referrals and appointments  In addition, I have reviewed and discussed with patient certain preventive protocols, quality metrics, and best practice recommendations. A written personalized care plan for preventive services as well as general preventive health recommendations were provided to patient.     Varney Biles, LPN  01/31/5871   Reviewed above information.  Agree with assessment and plan.   Dr Nicki Reaper

## 2019-06-11 ENCOUNTER — Other Ambulatory Visit: Payer: Self-pay | Admitting: Internal Medicine

## 2019-06-11 ENCOUNTER — Encounter: Payer: Self-pay | Admitting: Internal Medicine

## 2019-06-12 NOTE — Telephone Encounter (Signed)
Refilled: 04/07/2019 Last OV: 05/22/2019 Next OV: 09/24/2019

## 2019-06-13 NOTE — Telephone Encounter (Signed)
rx ok'd for miacalcin nasal spray.

## 2019-06-22 ENCOUNTER — Other Ambulatory Visit: Payer: Self-pay

## 2019-06-22 ENCOUNTER — Other Ambulatory Visit (INDEPENDENT_AMBULATORY_CARE_PROVIDER_SITE_OTHER): Payer: Medicare Other

## 2019-06-22 DIAGNOSIS — N289 Disorder of kidney and ureter, unspecified: Secondary | ICD-10-CM | POA: Diagnosis not present

## 2019-06-22 LAB — BASIC METABOLIC PANEL
BUN: 25 mg/dL — ABNORMAL HIGH (ref 6–23)
CO2: 24 mEq/L (ref 19–32)
Calcium: 9.3 mg/dL (ref 8.4–10.5)
Chloride: 104 mEq/L (ref 96–112)
Creatinine, Ser: 0.94 mg/dL (ref 0.40–1.20)
GFR: 56.91 mL/min — ABNORMAL LOW (ref >60.00)
Glucose, Bld: 97 mg/dL (ref 70–99)
Potassium: 4 mEq/L (ref 3.5–5.1)
Sodium: 137 mEq/L (ref 135–145)

## 2019-06-22 LAB — MAGNESIUM: Magnesium: 1.8 mg/dL (ref 1.5–2.5)

## 2019-06-23 ENCOUNTER — Encounter: Payer: Self-pay | Admitting: Internal Medicine

## 2019-06-23 ENCOUNTER — Other Ambulatory Visit: Payer: Self-pay | Admitting: Internal Medicine

## 2019-06-23 NOTE — Telephone Encounter (Signed)
rx ok'd for ambien #30 with one refill.   

## 2019-06-30 ENCOUNTER — Other Ambulatory Visit: Payer: Self-pay

## 2019-06-30 ENCOUNTER — Ambulatory Visit (INDEPENDENT_AMBULATORY_CARE_PROVIDER_SITE_OTHER): Payer: Medicare Other

## 2019-06-30 DIAGNOSIS — E538 Deficiency of other specified B group vitamins: Secondary | ICD-10-CM

## 2019-06-30 MED ORDER — CYANOCOBALAMIN 1000 MCG/ML IJ SOLN
1000.0000 ug | Freq: Once | INTRAMUSCULAR | Status: AC
  Administered 2019-06-30: 1000 ug via INTRAMUSCULAR

## 2019-06-30 NOTE — Progress Notes (Addendum)
Patient presented today for B12 injection per MD order.  Administered IM in the left deltoid.  Administered without incident.  Patient tolerated well with no signs of distress.  Reviewed.  Dr Nicki Reaper

## 2019-07-16 ENCOUNTER — Other Ambulatory Visit: Payer: Self-pay

## 2019-07-16 ENCOUNTER — Ambulatory Visit
Admission: RE | Admit: 2019-07-16 | Discharge: 2019-07-16 | Disposition: A | Discharge status: Home / Self Care | Payer: Medicare Other | Source: Ambulatory Visit | Attending: Internal Medicine | Admitting: Internal Medicine

## 2019-07-16 DIAGNOSIS — Z1231 Encounter for screening mammogram for malignant neoplasm of breast: Secondary | ICD-10-CM | POA: Diagnosis not present

## 2019-08-04 ENCOUNTER — Ambulatory Visit (INDEPENDENT_AMBULATORY_CARE_PROVIDER_SITE_OTHER): Payer: Medicare Other

## 2019-08-04 ENCOUNTER — Other Ambulatory Visit: Payer: Self-pay

## 2019-08-04 DIAGNOSIS — E538 Deficiency of other specified B group vitamins: Secondary | ICD-10-CM

## 2019-08-04 MED ORDER — CYANOCOBALAMIN 1000 MCG/ML IJ SOLN
1000.0000 ug | Freq: Once | INTRAMUSCULAR | Status: AC
  Administered 2019-08-04: 1000 ug via INTRAMUSCULAR

## 2019-08-04 NOTE — Progress Notes (Addendum)
Patient presented today for B12 injection.  Administered IM in right deltoid.  Patient tolerated well with no signs of distress.  Reviewed.  Dr Scott    

## 2019-08-24 ENCOUNTER — Other Ambulatory Visit: Payer: Self-pay | Admitting: Internal Medicine

## 2019-08-28 ENCOUNTER — Other Ambulatory Visit: Payer: Self-pay | Admitting: Internal Medicine

## 2019-09-04 ENCOUNTER — Other Ambulatory Visit: Payer: Self-pay | Admitting: Internal Medicine

## 2019-09-04 DIAGNOSIS — R252 Cramp and spasm: Secondary | ICD-10-CM

## 2019-09-08 ENCOUNTER — Ambulatory Visit (INDEPENDENT_AMBULATORY_CARE_PROVIDER_SITE_OTHER): Payer: Medicare Other

## 2019-09-08 ENCOUNTER — Other Ambulatory Visit: Payer: Self-pay

## 2019-09-08 DIAGNOSIS — Z23 Encounter for immunization: Secondary | ICD-10-CM

## 2019-09-08 DIAGNOSIS — E538 Deficiency of other specified B group vitamins: Secondary | ICD-10-CM

## 2019-09-08 MED ORDER — CYANOCOBALAMIN 1000 MCG/ML IJ SOLN
1000.0000 ug | Freq: Once | INTRAMUSCULAR | Status: AC
  Administered 2019-09-08: 12:00:00 1000 ug via INTRAMUSCULAR

## 2019-09-08 NOTE — Progress Notes (Addendum)
Pt presents today for b12 injection. Left deltoid, IM. Pt tolerated well.   Reviewed.  Dr Nicki Reaper

## 2019-09-22 ENCOUNTER — Other Ambulatory Visit: Payer: Self-pay

## 2019-09-24 ENCOUNTER — Ambulatory Visit (INDEPENDENT_AMBULATORY_CARE_PROVIDER_SITE_OTHER): Payer: Medicare Other | Admitting: Internal Medicine

## 2019-09-24 ENCOUNTER — Other Ambulatory Visit: Payer: Self-pay

## 2019-09-24 DIAGNOSIS — K824 Cholesterolosis of gallbladder: Secondary | ICD-10-CM

## 2019-09-24 DIAGNOSIS — R739 Hyperglycemia, unspecified: Secondary | ICD-10-CM

## 2019-09-24 DIAGNOSIS — D649 Anemia, unspecified: Secondary | ICD-10-CM

## 2019-09-24 DIAGNOSIS — E039 Hypothyroidism, unspecified: Secondary | ICD-10-CM

## 2019-09-24 DIAGNOSIS — K754 Autoimmune hepatitis: Secondary | ICD-10-CM

## 2019-09-24 DIAGNOSIS — R252 Cramp and spasm: Secondary | ICD-10-CM

## 2019-09-24 DIAGNOSIS — K2270 Barrett's esophagus without dysplasia: Secondary | ICD-10-CM

## 2019-09-24 DIAGNOSIS — Z0000 Encounter for general adult medical examination without abnormal findings: Secondary | ICD-10-CM

## 2019-09-24 DIAGNOSIS — K209 Esophagitis, unspecified without bleeding: Secondary | ICD-10-CM

## 2019-09-24 DIAGNOSIS — R109 Unspecified abdominal pain: Secondary | ICD-10-CM

## 2019-09-24 DIAGNOSIS — I10 Essential (primary) hypertension: Secondary | ICD-10-CM | POA: Diagnosis not present

## 2019-09-24 MED ORDER — MAGNESIUM OXIDE -MG SUPPLEMENT 400 (240 MG) MG PO TABS: ORAL_TABLET | ORAL | 1 refills | Status: DC

## 2019-09-24 MED ORDER — ZOLPIDEM TARTRATE ER 6.25 MG PO TBCR: EXTENDED_RELEASE_TABLET | ORAL | 1 refills | Status: DC

## 2019-09-24 NOTE — Assessment & Plan Note (Addendum)
Physical today 9//24/20.  Mammogram 07/16/19 - Birads I.  Followed by GI.  Will notify me when agreeable for bone density.

## 2019-09-24 NOTE — Progress Notes (Signed)
Patient ID: Lori [name], female   DOB: 03-19-1934, 85 y.o.   MRN: [id number]   Subjective:    Patient ID: Lori [name], female    DOB: 01/31/1934, 85 y.o.   MRN: [id number]  HPI  Patient with past history of autoimmune hepatitis, anemia, hypertension and hypercholesterolemia.  She comes in today to follow up on these issues as well as for a complete physical exam.  She reports she is doing relatively well.  Tries to stay active.  No chest pain.  No sob.  No acid reflux.  Bowels are better.  She does still notice some crampy abdominal pain and fullness at times.  Flares are less frequent.  Discomfort resolves with bowel movement.  Her leg cramping is better.  Just had mammogram.  Will notify me when agreeable for bone density.  Blood pressure doing well.    Past Medical History:  Diagnosis Date  . Anemia    iron deficient  . Arthritis   . Autoimmune hepatitis ([place]) 05/10/2016   Hep C  . B12 deficiency   . Barrett esophagus 01/2016  . Bradycardia   . Diverticulosis   . GERD (gastroesophageal reflux disease)   . Hypertension   . Hypothyroidism   . Ulcer disease    Past Surgical History:  Procedure Laterality Date  . CHOLECYSTECTOMY N/A 06/25/2018   Procedure: LAPAROSCOPIC CHOLECYSTECTOMY WITH INTRAOPERATIVE CHOLANGIOGRAM;  Surgeon: Robert Bellow, MD;  Location: [facility] ORS;  Service: General;  Laterality: N/A;  . ESOPHAGOGASTRODUODENOSCOPY (EGD) WITH PROPOFOL N/A 01/30/2016   Procedure: ESOPHAGOGASTRODUODENOSCOPY (EGD) WITH PROPOFOL;  Surgeon: Lollie Sails, MD;  Location: [facility]COPY;  Service: Endoscopy;  Laterality: N/A;  . ESOPHAGOGASTRODUODENOSCOPY (EGD) WITH PROPOFOL N/A 01/14/2018   Procedure: ESOPHAGOGASTRODUODENOSCOPY (EGD) WITH PROPOFOL;  Surgeon: Lollie Sails, MD;  Location: [facility];  Service: Endoscopy;  Laterality: N/A;  . ESOPHAGOGASTRODUODENOSCOPY (EGD) WITH PROPOFOL N/A 05/20/2018   Procedure: ESOPHAGOGASTRODUODENOSCOPY (EGD) WITH  PROPOFOL;  Surgeon: Lollie Sails, MD;  [facility];  Service: Endoscopy;  Laterality: N/A;  . ESOPHAGOGASTRODUODENOSCOPY (EGD) WITH PROPOFOL N/A 01/13/2019   Procedure: ESOPHAGOGASTRODUODENOSCOPY (EGD) WITH PROPOFOL;  Surgeon: Lollie Sails, MD;  Location: [facility] END[facility]COPY;  Service: Endoscopy;  Laterality: N/A;  . HERNIA REPAIR    . lap hiatal hernia repair  07/08/08  . LEFT OOPHORECTOMY  1993   benign ovarian cyst  . TUBAL LIGATION  1981   Family History  Problem Relation Age of Onset  . Stroke Mother   . Breast cancer Neg Hx   . Colon cancer Neg Hx    Social History   Socioeconomic History  . Marital status: Widowed    Spouse name: Not on file  . Number of children: 0  . Years of education: Not on file  . Highest education level: Not on file  Occupational History  . Not on file  Social Needs  . Financial resource strain: Not hard at all  . Food insecurity    Worry: Never true    Inability: Never true  . Transportation needs    Medical: No    Non-medical: No  Tobacco Use  . Smoking status: Never Smoker  . Smokeless tobacco: Never Used  Substance and Sexual Activity  . Alcohol use: No    Alcohol/week: 0.0 standard drinks  . Drug use: No  . Sexual activity: Never  Lifestyle  . Physical activity    Days per week: 3 days    Minutes per session: 20 min  . Stress:  Not at all  Relationships  . Social Herbalist on phone: Not on file    Gets together: Not on file    Attends religious service: Not on file    Active member of club or organization: Not on file    Attends meetings of clubs or organizations: Not on file    Relationship status: Not on file  Other Topics Concern  . Not on file  Social History Narrative  . Not on file    Outpatient Encounter Medications as of 09/24/2019  Medication Sig  . aspirin 81 MG tablet Take 81 mg by mouth daily.  Marland Kitchen atenolol (TENORMIN) 25 MG tablet Take 1/2 TABLET DAILY  . benazepril (LOTENSIN) 20 MG  tablet Take 1 tablet (20 mg total) by mouth daily.  . calcitonin, salmon, (MIACALCIN/FORTICAL) 200 UNIT/ACT nasal spray USE 1 SPRAY IN ONE NOSTRIL DAILY. ALTERNATE NOSTRILS.  Marland Kitchen Calcium-Phosphorus-Vitamin D (CITRACAL +D3 PO) Take 1 tablet by mouth daily.  . chlorzoxazone (PARAFON) 500 MG tablet TAKE ONE TABLET AT BED- TIME AS NEEDED.  Marland Kitchen Cholecalciferol (D3-1000 PO) Take 1,000 Units by mouth daily.   . Cyanocobalamin (B-12) 1000 MCG/ML KIT Inject 1,000 mcg as directed every 30 (thirty) days.   Marland Kitchen GLUCOSAMINE-CHONDROITIN PO Take 1 tablet by mouth 2 (two) times daily.   . Magnesium Oxide 400 (240 Mg) MG TABS Take 1 tablet (400 mg total) by mouth 2 (two) times daily.  . Mesalamine (DELZICOL) 400 MG CPDR Take 400 mg by mouth 2 (two) times daily.   . pantoprazole (PROTONIX) 40 MG tablet Take 40 mg by mouth 2 (two) times daily.   Vladimir Faster Glycol-Propyl Glycol (SYSTANE OP) Take 1 drop by mouth daily as needed (for dry eyes).  . SYNTHROID 75 MCG tablet TAKE ONE TABLET DAILY BEFORE BREAKFAST.  Marland Kitchen Wheat Dextrin (BENEFIBER DRINK MIX) PACK Take 1 each by mouth daily.   Marland Kitchen zolpidem (AMBIEN CR) 6.25 MG CR tablet One tablet q hs prn  . [DISCONTINUED] cholestyramine (QUESTRAN) 4 g packet Take 4 g by mouth daily as needed.   . [DISCONTINUED] Magnesium Oxide 400 (240 Mg) MG TABS Take 1 tablet (400 mg total) by mouth 2 (two) times daily.  . [DISCONTINUED] zolpidem (AMBIEN CR) 6.25 MG CR tablet TAKE ONE TABLET AT BED- TIME IF NEEDED FOR SLEEP.   No facility-administered encounter medications on file as of 09/24/2019.     Review of Systems  Constitutional: Negative for appetite change and unexpected weight change.  HENT: Negative for congestion and sinus pressure.   Eyes: Negative for pain and visual disturbance.  Respiratory: Negative for cough, chest tightness and shortness of breath.   Cardiovascular: Negative for chest pain, palpitations and leg swelling.  Gastrointestinal: Negative for abdominal pain,  diarrhea, nausea and vomiting.  Genitourinary: Negative for difficulty urinating and dysuria.  Musculoskeletal: Negative for joint swelling and myalgias.  Skin: Negative for color change and rash.  Neurological: Negative for dizziness, light-headedness and headaches.  Hematological: Negative for adenopathy. Does not bruise/bleed easily.  Psychiatric/Behavioral: Negative for agitation and dysphoric mood.       Objective:    Physical Exam Constitutional:      General: She is not in acute distress.    Appearance: Normal appearance. She is well-developed.  HENT:     Head: Normocephalic and atraumatic.     Right Ear: External ear normal.     Left Ear: External ear normal.  Eyes:     General: No scleral icterus.  Right eye: No discharge.        Left eye: No discharge.     Conjunctiva/sclera: Conjunctivae normal.  Neck:     Musculoskeletal: Neck supple. No muscular tenderness.     Thyroid: No thyromegaly.  Cardiovascular:     Rate and Rhythm: Normal rate and regular rhythm.  Pulmonary:     Effort: No tachypnea, accessory muscle usage or respiratory distress.     Breath sounds: Normal breath sounds. No decreased breath sounds or wheezing.  Chest:     Breasts:        Right: No inverted nipple, mass, nipple discharge or tenderness (no axillary adenopathy).        Left: No inverted nipple, mass, nipple discharge or tenderness (no axilarry adenopathy).  Abdominal:     General: Bowel sounds are normal.     Palpations: Abdomen is soft.     Tenderness: There is no abdominal tenderness.  Musculoskeletal:        General: No tenderness.  Lymphadenopathy:     Cervical: No cervical adenopathy.  Skin:    Findings: No erythema or rash.  Neurological:     Mental Status: She is alert and oriented to person, place, and time.  Psychiatric:        Mood and Affect: Mood normal.        Behavior: Behavior normal.     BP 128/76   Pulse (!) 58   Temp (!) 96.6 F (35.9 C)   Resp 16    Wt 158 lb (71.7 kg)   SpO2 97%   BMI 30.86 kg/m  Wt Readings from Last 3 Encounters:  09/24/19 158 lb (71.7 kg)  01/20/19 166 lb 9.6 oz (75.6 kg)  01/13/19 160 lb (72.6 kg)     Lab Results  Component Value Date   WBC 4.0 06/04/2018   HGB 12.3 06/04/2018   HCT 36.9 06/04/2018   PLT 195.0 06/04/2018   GLUCOSE 97 06/22/2019   CHOL 173 05/18/2019   TRIG 149.0 05/18/2019   HDL 42.60 05/18/2019   LDLCALC 101 (H) 05/18/2019   ALT 9 05/18/2019   AST 12 05/18/2019   NA 137 06/22/2019   K 4.0 06/22/2019   CL 104 06/22/2019   CREATININE 0.94 06/22/2019   BUN 25 (H) 06/22/2019   CO2 24 06/22/2019   TSH 2.48 05/18/2019   INR 0.89 06/16/2018   HGBA1C 5.9 05/18/2019    Mm 3d Screen Breast Bilateral  Result Date: 07/16/2019 CLINICAL DATA:  Screening. EXAM: DIGITAL SCREENING BILATERAL MAMMOGRAM WITH TOMO AND CAD COMPARISON:  Previous exam(s). ACR Breast Density Category c: The breast tissue is heterogeneously dense, which may obscure small masses. FINDINGS: There are no findings suspicious for malignancy. Images were processed with CAD. IMPRESSION: No mammographic evidence of malignancy. A result letter of this screening mammogram will be mailed directly to the patient. RECOMMENDATION: Screening mammogram in one year. (Code:SM-B-01Y) BI-RADS CATEGORY  1: Negative. Electronically Signed   By: Kristopher Oppenheim M.D.   On: 07/16/2019 10:27       Assessment & Plan:   Problem List Items Addressed This Visit    Abdominal discomfort    Followed regularly by GI.  Flares less frequent.  Still will have some intermittent flares with cramping and abdominal fullness.  D/w GI.  Has had extensive w/up.        Anemia    Previously evaluated by hematology.  Found to be iron deficient and b12 deficient and MGUS.  Follow cbc.  Autoimmune hepatitis ([place])    Being followed by GI.  Follow liver function tests.        Barrett's esophagus with esophagitis    Followed by GI.  Upper symptoms  controlled.  Just had EGD 12/2018.  Recommended f/u in 2 years.        Gall bladder polyp    S/p cholecystectomy.        Health care maintenance    Physical today 9//24/20.  Mammogram 07/16/19 - Birads I.  Followed by GI.  Will notify me when agreeable for bone density.        Hyperglycemia    Low carb diet and exercise.  Follow met b and a1c.       Hypertension    Blood pressure under good control.  Continue same medication regimen.  Follow pressures.  Follow metabolic panel.        Hypothyroidism    On thyroid replacement.  Follow tsh.       Leg cramps    On magnesium.  Cramps better.            Einar Pheasant, MD

## 2019-09-25 ENCOUNTER — Telehealth: Payer: Self-pay | Admitting: *Deleted

## 2019-09-25 DIAGNOSIS — I10 Essential (primary) hypertension: Secondary | ICD-10-CM

## 2019-09-25 DIAGNOSIS — D649 Anemia, unspecified: Secondary | ICD-10-CM

## 2019-09-25 DIAGNOSIS — K754 Autoimmune hepatitis: Secondary | ICD-10-CM

## 2019-09-25 DIAGNOSIS — R945 Abnormal results of liver function studies: Secondary | ICD-10-CM

## 2019-09-25 DIAGNOSIS — R739 Hyperglycemia, unspecified: Secondary | ICD-10-CM

## 2019-09-25 DIAGNOSIS — R7989 Other specified abnormal findings of blood chemistry: Secondary | ICD-10-CM

## 2019-09-25 DIAGNOSIS — R252 Cramp and spasm: Secondary | ICD-10-CM

## 2019-09-25 NOTE — Telephone Encounter (Signed)
Please place future orders for lab appt.  

## 2019-09-26 NOTE — Telephone Encounter (Signed)
Orders placed for labs

## 2019-09-27 ENCOUNTER — Encounter: Payer: Self-pay | Admitting: Internal Medicine

## 2019-09-27 DIAGNOSIS — R109 Unspecified abdominal pain: Secondary | ICD-10-CM | POA: Insufficient documentation

## 2019-09-27 NOTE — Assessment & Plan Note (Signed)
Previously evaluated by hematology.  Found to be iron deficient and b12 deficient and MGUS.  Follow cbc.

## 2019-09-27 NOTE — Assessment & Plan Note (Signed)
Low carb diet and exercise.  Follow met b and a1c.  

## 2019-09-27 NOTE — Assessment & Plan Note (Signed)
Being followed by GI.  Follow liver function tests.   

## 2019-09-27 NOTE — Assessment & Plan Note (Signed)
Followed by GI.  Upper symptoms controlled.  Just had EGD 12/2018.  Recommended f/u in 2 years.

## 2019-09-27 NOTE — Assessment & Plan Note (Signed)
On thyroid replacement.  Follow tsh.  

## 2019-09-27 NOTE — Assessment & Plan Note (Signed)
Blood pressure under good control.  Continue same medication regimen.  Follow pressures.  Follow metabolic panel.   

## 2019-09-27 NOTE — Assessment & Plan Note (Signed)
On magnesium.  Cramps better.

## 2019-09-27 NOTE — Assessment & Plan Note (Signed)
S/p cholecystectomy

## 2019-09-27 NOTE — Assessment & Plan Note (Signed)
Followed regularly by GI.  Flares less frequent.  Still will have some intermittent flares with cramping and abdominal fullness.  D/w GI.  Has had extensive w/up.

## 2019-09-29 ENCOUNTER — Other Ambulatory Visit: Payer: Self-pay

## 2019-09-29 ENCOUNTER — Other Ambulatory Visit (INDEPENDENT_AMBULATORY_CARE_PROVIDER_SITE_OTHER): Payer: Medicare Other

## 2019-09-29 DIAGNOSIS — R252 Cramp and spasm: Secondary | ICD-10-CM

## 2019-09-29 DIAGNOSIS — I10 Essential (primary) hypertension: Secondary | ICD-10-CM

## 2019-09-29 DIAGNOSIS — R945 Abnormal results of liver function studies: Secondary | ICD-10-CM | POA: Diagnosis not present

## 2019-09-29 DIAGNOSIS — R739 Hyperglycemia, unspecified: Secondary | ICD-10-CM

## 2019-09-29 DIAGNOSIS — D649 Anemia, unspecified: Secondary | ICD-10-CM

## 2019-09-29 DIAGNOSIS — R7989 Other specified abnormal findings of blood chemistry: Secondary | ICD-10-CM

## 2019-09-29 LAB — CBC WITH DIFFERENTIAL/PLATELET
Basophils Absolute: 0.1 10*3/uL (ref 0.0–0.1)
Basophils Relative: 1.3 % (ref 0.0–3.0)
Eosinophils Absolute: 0.1 10*3/uL (ref 0.0–0.7)
Eosinophils Relative: 3.1 % (ref 0.0–5.0)
HCT: 35.9 % — ABNORMAL LOW (ref 36.0–46.0)
Hemoglobin: 11.9 g/dL — ABNORMAL LOW (ref 12.0–15.0)
Lymphocytes Relative: 27.8 % (ref 12.0–46.0)
Lymphs Abs: 1.2 10*3/uL (ref 0.7–4.0)
MCHC: 33.3 g/dL (ref 30.0–36.0)
MCV: 89.8 fl (ref 78.0–100.0)
Monocytes Absolute: 0.4 10*3/uL (ref 0.1–1.0)
Monocytes Relative: 10.5 % (ref 3.0–12.0)
Neutro Abs: 2.4 10*3/uL (ref 1.4–7.7)
Neutrophils Relative %: 57.3 % (ref 43.0–77.0)
Platelets: 197 10*3/uL (ref 150.0–400.0)
RBC: 4 Mil/uL (ref 3.87–5.11)
RDW: 13.3 % (ref 11.5–15.5)
WBC: 4.2 10*3/uL (ref 4.0–10.5)

## 2019-09-29 LAB — MAGNESIUM: Magnesium: 1.7 mg/dL (ref 1.5–2.5)

## 2019-09-29 LAB — HEPATIC FUNCTION PANEL
ALT: 9 U/L (ref 0–35)
AST: 13 U/L (ref 0–37)
Albumin: 4.1 g/dL (ref 3.5–5.2)
Alkaline Phosphatase: 45 U/L (ref 39–117)
Bilirubin, Direct: 0.2 mg/dL (ref 0.0–0.3)
Total Bilirubin: 1.2 mg/dL (ref 0.2–1.2)
Total Protein: 7.1 g/dL (ref 6.0–8.3)

## 2019-09-29 LAB — HEMOGLOBIN A1C: Hgb A1c MFr Bld: 5.7 % (ref 4.6–6.5)

## 2019-09-29 LAB — LIPID PANEL
Cholesterol: 150 mg/dL (ref 0–200)
HDL: 46.4 mg/dL (ref >39.00)
LDL Cholesterol: 83 mg/dL (ref 0–99)
NonHDL: 103.12
Total CHOL/HDL Ratio: 3
Triglycerides: 101 mg/dL (ref 0.0–149.0)
VLDL: 20.2 mg/dL (ref 0.0–40.0)

## 2019-09-29 LAB — BASIC METABOLIC PANEL
BUN: 21 mg/dL (ref 6–23)
CO2: 27 mEq/L (ref 19–32)
Calcium: 9.8 mg/dL (ref 8.4–10.5)
Chloride: 104 mEq/L (ref 96–112)
Creatinine, Ser: 0.99 mg/dL (ref 0.40–1.20)
GFR: 53.57 mL/min — ABNORMAL LOW (ref >60.00)
Glucose, Bld: 93 mg/dL (ref 70–99)
Potassium: 3.7 mEq/L (ref 3.5–5.1)
Sodium: 141 mEq/L (ref 135–145)

## 2019-10-12 ENCOUNTER — Ambulatory Visit (INDEPENDENT_AMBULATORY_CARE_PROVIDER_SITE_OTHER): Payer: Medicare Other

## 2019-10-12 ENCOUNTER — Other Ambulatory Visit: Payer: Self-pay

## 2019-10-12 DIAGNOSIS — E538 Deficiency of other specified B group vitamins: Secondary | ICD-10-CM

## 2019-10-12 MED ORDER — CYANOCOBALAMIN 1000 MCG/ML IJ SOLN
1000.0000 ug | Freq: Once | INTRAMUSCULAR | Status: AC
  Administered 2019-10-12: 11:00:00 1000 ug via INTRAMUSCULAR

## 2019-10-12 NOTE — Progress Notes (Addendum)
Patient presented today for B12 injection.  Administered IM in the right deltoid.  Patient tolerated well with no signs of distress.  Reviewed.  Dr Scott  

## 2019-11-02 ENCOUNTER — Other Ambulatory Visit: Payer: Self-pay | Admitting: Internal Medicine

## 2019-11-02 ENCOUNTER — Telehealth: Payer: Self-pay | Admitting: *Deleted

## 2019-11-02 DIAGNOSIS — D649 Anemia, unspecified: Secondary | ICD-10-CM

## 2019-11-02 NOTE — Telephone Encounter (Signed)
Orders placed for f/u labs.  

## 2019-11-02 NOTE — Telephone Encounter (Signed)
Please place future orders for lab appt.  

## 2019-11-02 NOTE — Progress Notes (Signed)
Order placed for f/u labs.  

## 2019-11-03 ENCOUNTER — Other Ambulatory Visit (INDEPENDENT_AMBULATORY_CARE_PROVIDER_SITE_OTHER): Payer: Medicare Other

## 2019-11-03 ENCOUNTER — Other Ambulatory Visit: Payer: Self-pay

## 2019-11-03 DIAGNOSIS — D649 Anemia, unspecified: Secondary | ICD-10-CM | POA: Diagnosis not present

## 2019-11-03 LAB — IBC + FERRITIN
Ferritin: 35.9 ng/mL (ref 10.0–291.0)
Iron: 65 ug/dL (ref 42–145)
Saturation Ratios: 16.4 % — ABNORMAL LOW (ref 20.0–50.0)
Transferrin: 283 mg/dL (ref 212.0–360.0)

## 2019-11-03 LAB — CBC WITH DIFFERENTIAL/PLATELET
Basophils Absolute: 0.1 10*3/uL (ref 0.0–0.1)
Basophils Relative: 1.1 % (ref 0.0–3.0)
Eosinophils Absolute: 0.1 10*3/uL (ref 0.0–0.7)
Eosinophils Relative: 1.9 % (ref 0.0–5.0)
HCT: 37.5 % (ref 36.0–46.0)
Hemoglobin: 12.3 g/dL (ref 12.0–15.0)
Lymphocytes Relative: 17.9 % (ref 12.0–46.0)
Lymphs Abs: 0.9 10*3/uL (ref 0.7–4.0)
MCHC: 32.9 g/dL (ref 30.0–36.0)
MCV: 90 fl (ref 78.0–100.0)
Monocytes Absolute: 0.4 10*3/uL (ref 0.1–1.0)
Monocytes Relative: 7.8 % (ref 3.0–12.0)
Neutro Abs: 3.5 10*3/uL (ref 1.4–7.7)
Neutrophils Relative %: 71.3 % (ref 43.0–77.0)
Platelets: 219 10*3/uL (ref 150.0–400.0)
RBC: 4.16 Mil/uL (ref 3.87–5.11)
RDW: 13.5 % (ref 11.5–15.5)
WBC: 5 10*3/uL (ref 4.0–10.5)

## 2019-11-04 ENCOUNTER — Encounter: Payer: Self-pay | Admitting: Internal Medicine

## 2019-11-09 DIAGNOSIS — I491 Atrial premature depolarization: Secondary | ICD-10-CM | POA: Diagnosis not present

## 2019-11-09 DIAGNOSIS — I10 Essential (primary) hypertension: Secondary | ICD-10-CM | POA: Diagnosis not present

## 2019-11-09 DIAGNOSIS — R9431 Abnormal electrocardiogram [ECG] [EKG]: Secondary | ICD-10-CM | POA: Diagnosis not present

## 2019-11-09 DIAGNOSIS — R001 Bradycardia, unspecified: Secondary | ICD-10-CM | POA: Diagnosis not present

## 2019-11-16 ENCOUNTER — Ambulatory Visit (INDEPENDENT_AMBULATORY_CARE_PROVIDER_SITE_OTHER): Payer: Medicare Other

## 2019-11-16 ENCOUNTER — Other Ambulatory Visit: Payer: Self-pay

## 2019-11-16 DIAGNOSIS — E538 Deficiency of other specified B group vitamins: Secondary | ICD-10-CM

## 2019-11-16 MED ORDER — CYANOCOBALAMIN 1000 MCG/ML IJ SOLN
1000.0000 ug | Freq: Once | INTRAMUSCULAR | Status: AC
  Administered 2019-11-16: 12:00:00 1000 ug via INTRAMUSCULAR

## 2019-11-16 NOTE — Progress Notes (Addendum)
Lori [name] presents today for injection per MD orders. B12 injection  administered IM in left Upper Arm. Administration without incident. Patient tolerated well.  Aaleah Hirsch,cma   Reviewed.  Dr Nicki Reaper

## 2019-11-25 ENCOUNTER — Other Ambulatory Visit: Payer: Self-pay | Admitting: Internal Medicine

## 2019-11-25 NOTE — Telephone Encounter (Signed)
Last OV 09/24/19 Next OV 02/04/20 Last refill 09/24/19

## 2019-12-17 ENCOUNTER — Other Ambulatory Visit: Payer: Self-pay

## 2019-12-17 ENCOUNTER — Ambulatory Visit (INDEPENDENT_AMBULATORY_CARE_PROVIDER_SITE_OTHER): Payer: Medicare Other

## 2019-12-17 VITALS — Temp 96.5°F

## 2019-12-17 DIAGNOSIS — E538 Deficiency of other specified B group vitamins: Secondary | ICD-10-CM

## 2019-12-17 MED ORDER — CYANOCOBALAMIN 1000 MCG/ML IJ SOLN
1000.0000 ug | Freq: Once | INTRAMUSCULAR | Status: AC
  Administered 2019-12-17: 1000 ug via INTRAMUSCULAR

## 2019-12-17 NOTE — Progress Notes (Addendum)
Lori [name] presents today for injection per MD orders. B12 injection  administered IM in right Upper Arm. Administration without incident. Patient tolerated well.  Theresia Pree,cma   Reviewed.  Dr Nicki Reaper

## 2019-12-26 ENCOUNTER — Other Ambulatory Visit: Payer: Self-pay | Admitting: Internal Medicine

## 2019-12-26 ENCOUNTER — Encounter: Payer: Self-pay | Admitting: Internal Medicine

## 2019-12-28 ENCOUNTER — Telehealth: Payer: Self-pay | Admitting: Internal Medicine

## 2019-12-28 ENCOUNTER — Other Ambulatory Visit: Payer: Self-pay | Admitting: Internal Medicine

## 2019-12-28 MED ORDER — ZOLPIDEM TARTRATE ER 6.25 MG PO TBCR: EXTENDED_RELEASE_TABLET | ORAL | 0 refills | Status: DC

## 2019-12-28 NOTE — Telephone Encounter (Signed)
I have start a Prior authorization for medication Ambien CR. Patient last refill 11/20/19 for 30 no refills last OV 09/24/19 can we refill? Patient is out of [place].

## 2019-12-28 NOTE — Telephone Encounter (Signed)
Left detailed message to Approval from insurance for Ambien and refill sent to [facility].

## 2019-12-28 NOTE — Telephone Encounter (Signed)
Dr Derrel Nip refilled Lori [name].

## 2019-12-29 ENCOUNTER — Other Ambulatory Visit: Payer: Self-pay | Admitting: Internal Medicine

## 2020-01-08 ENCOUNTER — Other Ambulatory Visit: Payer: Self-pay | Admitting: Internal Medicine

## 2020-01-11 NOTE — Telephone Encounter (Signed)
Just refilled 12/28/19.  Refill not needed at this time.

## 2020-01-11 NOTE — Telephone Encounter (Signed)
Will not let me refuse this medication. It was just filled on 12/28. Refill not needed at this time.

## 2020-01-13 ENCOUNTER — Other Ambulatory Visit: Payer: Self-pay | Admitting: Internal Medicine

## 2020-01-14 ENCOUNTER — Other Ambulatory Visit: Payer: Self-pay

## 2020-01-14 NOTE — Telephone Encounter (Signed)
LMTCB

## 2020-01-14 NOTE — Telephone Encounter (Signed)
Just refilled ambien two weeks ago.  Is this just to have on file?  Confirm not out of medication.

## 2020-01-14 NOTE — Telephone Encounter (Signed)
Refilled: 12/28/2019 Last OV: 09/24/2019 Next OV: 02/04/2020

## 2020-01-19 ENCOUNTER — Other Ambulatory Visit: Payer: Self-pay

## 2020-01-19 ENCOUNTER — Ambulatory Visit (INDEPENDENT_AMBULATORY_CARE_PROVIDER_SITE_OTHER): Payer: Medicare Other

## 2020-01-19 VITALS — Temp 96.6°F

## 2020-01-19 DIAGNOSIS — E538 Deficiency of other specified B group vitamins: Secondary | ICD-10-CM | POA: Diagnosis not present

## 2020-01-19 MED ORDER — CYANOCOBALAMIN 1000 MCG/ML IJ SOLN
1000.0000 ug | Freq: Once | INTRAMUSCULAR | Status: AC
  Administered 2020-01-19: 1000 ug via INTRAMUSCULAR

## 2020-01-19 NOTE — Progress Notes (Addendum)
Patient came in today for B-12 injection in left deltoid, IM. Patient tolerated well.   Reviewed.  Dr Nicki Reaper

## 2020-01-22 ENCOUNTER — Other Ambulatory Visit: Payer: Self-pay

## 2020-01-22 MED ORDER — CALCITONIN (SALMON) 200 UNIT/ACT NA SOLN: NASAL | 3 refills | Status: DC

## 2020-01-27 ENCOUNTER — Other Ambulatory Visit: Payer: Self-pay | Admitting: Internal Medicine

## 2020-01-28 NOTE — Telephone Encounter (Signed)
rx ok'd for ambien #30 with one refill.   

## 2020-02-04 ENCOUNTER — Telehealth: Payer: Self-pay | Admitting: Internal Medicine

## 2020-02-04 ENCOUNTER — Ambulatory Visit (INDEPENDENT_AMBULATORY_CARE_PROVIDER_SITE_OTHER): Payer: Medicare Other | Admitting: Internal Medicine

## 2020-02-04 ENCOUNTER — Other Ambulatory Visit: Payer: Self-pay

## 2020-02-04 DIAGNOSIS — E538 Deficiency of other specified B group vitamins: Secondary | ICD-10-CM

## 2020-02-04 DIAGNOSIS — I10 Essential (primary) hypertension: Secondary | ICD-10-CM | POA: Diagnosis not present

## 2020-02-04 DIAGNOSIS — E039 Hypothyroidism, unspecified: Secondary | ICD-10-CM | POA: Diagnosis not present

## 2020-02-04 DIAGNOSIS — K209 Esophagitis, unspecified without bleeding: Secondary | ICD-10-CM

## 2020-02-04 DIAGNOSIS — R739 Hyperglycemia, unspecified: Secondary | ICD-10-CM | POA: Diagnosis not present

## 2020-02-04 DIAGNOSIS — K754 Autoimmune hepatitis: Secondary | ICD-10-CM | POA: Diagnosis not present

## 2020-02-04 DIAGNOSIS — D649 Anemia, unspecified: Secondary | ICD-10-CM | POA: Diagnosis not present

## 2020-02-04 DIAGNOSIS — R252 Cramp and spasm: Secondary | ICD-10-CM | POA: Diagnosis not present

## 2020-02-04 DIAGNOSIS — R7989 Other specified abnormal findings of blood chemistry: Secondary | ICD-10-CM

## 2020-02-04 DIAGNOSIS — R945 Abnormal results of liver function studies: Secondary | ICD-10-CM

## 2020-02-04 DIAGNOSIS — K2270 Barrett's esophagus without dysplasia: Secondary | ICD-10-CM

## 2020-02-04 MED ORDER — CHLORZOXAZONE 500 MG PO TABS: ORAL_TABLET | ORAL | 0 refills | Status: DC

## 2020-02-04 NOTE — Progress Notes (Deleted)
Patient ID: Lori [name], female   DOB: [birth year], 85 y.o.   MRN: [id number] Outpatient Encounter Medications as of 02/04/2020  Medication Sig  . aspirin 81 MG tablet Take 81 mg by mouth daily.  Marland Kitchen atenolol (TENORMIN) 25 MG tablet Take 1/2 TABLET DAILY  . benazepril (LOTENSIN) 20 MG tablet Take 1 tablet (20 mg total) by mouth daily.  . calcitonin, salmon, (MIACALCIN/FORTICAL) 200 UNIT/ACT nasal spray USE 1 SPRAY IN ONE NOSTRIL DAILY. ALTERNATE NOSTRILS.  Marland Kitchen Calcium-Phosphorus-Vitamin D (CITRACAL +D3 PO) Take 1 tablet by mouth daily.  . chlorzoxazone (PARAFON) 500 MG tablet TAKE ONE TABLET AT BED- TIME AS NEEDED.  Marland Kitchen Cholecalciferol (D3-1000 PO) Take 1,000 Units by mouth daily.   . Cyanocobalamin (B-12) 1000 MCG/ML KIT Inject 1,000 mcg as directed every 30 (thirty) days.   Marland Kitchen GLUCOSAMINE-CHONDROITIN PO Take 1 tablet by mouth 2 (two) times daily.   . Magnesium Oxide 400 (240 Mg) MG TABS Take 1 tablet (400 mg total) by mouth 2 (two) times daily.  . Mesalamine (DELZICOL) 400 MG CPDR Take 400 mg by mouth 2 (two) times daily.   . pantoprazole (PROTONIX) 40 MG tablet Take 40 mg by mouth 2 (two) times daily.   Vladimir Faster Glycol-Propyl Glycol (SYSTANE OP) Take 1 drop by mouth daily as needed (for dry eyes).  . SYNTHROID 75 MCG tablet TAKE ONE TABLET DAILY BEFORE BREAKFAST.  Marland Kitchen Wheat Dextrin (BENEFIBER DRINK MIX) PACK Take 1 each by mouth daily.   Marland Kitchen zolpidem (AMBIEN CR) 6.25 MG CR tablet TAKE 1 TABLET BY MOUTH EVERY DAY AT BEDTIME AS NEEDED   No facility-administered encounter medications on file as of 02/04/2020.

## 2020-02-04 NOTE — Progress Notes (Signed)
Patient ID: Lori [name], female   DOB: 1934/07/15, 86 y.o.   MRN: [id number]   Virtual Visit via telephone Note  This visit type was conducted due to national recommendations for restrictions regarding the COVID-19 pandemic (e.g. social distancing).  This format is felt to be most appropriate for this patient at this time.  All issues noted in this document were discussed and addressed.  No physical exam was performed (except for noted visual exam findings with Video Visits).   I connected with [name] by telephone and verified that I am speaking with the correct person using two identifiers. Location patient: home Location provider: work Persons participating in the telephone visit: patient, provider  The limitations, risks, security and privacy concerns of performing an evaluation and management service by telephone and the availability of in person appointments have been discussed. The patient expressed understanding and agreed to proceed.   Reason for visit: scheduled follow up.   HPI: She reports she is doing relatively well.  Still has some GI flares - intermittent.  Decreased frequency.  Tries to stay active.  No chest pain or sob.  Has a history of Barretts.  Last evaluated 12/2018.  No acid reflux.  Recommended f/u EGD in 2 years.  Blood pressure doing ok - averaging 118-120/68-72.  Still having some muscle cramps.  Drinking pedialite.  Reports no numbness or tingling.  Drinking water.  No pain with walking.  Overall she feels she is doing relatively well.     ROS: See pertinent positives and negatives per HPI.  Past Medical History:  Diagnosis Date  . Anemia    iron deficient  . Arthritis   . Autoimmune hepatitis ([place]) 05/10/2016   Hep C  . B12 deficiency   . Barrett esophagus 01/2016  . Bradycardia   . Diverticulosis   . GERD (gastroesophageal reflux disease)   . Hypertension   . Hypothyroidism   . Ulcer disease     Past Surgical History:  Procedure  Laterality Date  . CHOLECYSTECTOMY N/A 06/25/2018   Procedure: LAPAROSCOPIC CHOLECYSTECTOMY WITH INTRAOPERATIVE CHOLANGIOGRAM;  Surgeon: Robert Bellow, MD;  Location: [facility] ORS;  Service: General;  Laterality: N/A;  . ESOPHAGOGASTRODUODENOSCOPY (EGD) WITH PROPOFOL N/A 01/30/2016   Procedure: ESOPHAGOGASTRODUODENOSCOPY (EGD) WITH PROPOFOL;  Surgeon: Lollie Sails, MD;  Location[facility] ENDOSCOPY;  Service: Endoscopy;  Laterality: N/A;  . ESOPHAGOGASTRODUODENOSCOPY (EGD) WITH PROPOFOL N/A 01/14/2018   Procedure: ESOPHAGOGASTRODUODENOSCOPY (EGD) WITH PROPOFOL;  Surgeon: Lollie Sails, MD;  Location: [facility] ENDOSCOPY;  Service: Endoscopy;  Laterality: N/A;  . ESOPHAGOGASTRODUODENOSCOPY (EGD) WITH PROPOFOL N/A 05/20/2018   Procedure: ESOPHAGOGASTRODUODENOSCOPY (EGD) WITH PROPOFOL;  Surgeon: Lollie Sails, MD;  Location: [facility] ENDOSCOPY;  Service: Endoscopy;  Laterality: N/A;  . ESOPHAGOGASTRODUODENOSCOPY (EGD) WITH PROPOFOL N/A 01/13/2019   Procedure: ESOPHAGOGASTRODUODENOSCOPY (EGD) WITH PROPOFOL;  Surgeon: Lollie Sails, MD;  Location: [facility] ENDOSCOPY;  Service: Endoscopy;  Laterality: N/A;  . HERNIA REPAIR    . lap hiatal hernia repair  07/08/08  . LEFT OOPHORECTOMY  1993   benign ovarian cyst  . TUBAL LIGATION  1981    Family History  Problem Relation Age of Onset  . Stroke Mother   . Breast cancer Neg Hx   . Colon cancer Neg Hx     SOCIAL HX: reviewed.    Current Outpatient Medications:  .  aspirin 81 MG tablet, Take 81 mg by mouth daily., Disp: , Rfl:  .  atenolol (TENORMIN) 25 MG tablet, Take 1/2 TABLET DAILY, Disp:  45 tablet, Rfl: 0 .  benazepril (LOTENSIN) 20 MG tablet, Take 1 tablet (20 mg total) by mouth daily., Disp: 90 tablet, Rfl: 1 .  calcitonin, salmon, (MIACALCIN/FORTICAL) 200 UNIT/ACT nasal spray, USE 1 SPRAY IN ONE NOSTRIL DAILY. ALTERNATE NOSTRILS., Disp: 3.7 mL, Rfl: 3 .  Calcium-Phosphorus-Vitamin D (CITRACAL +D3 PO), Take 1 tablet by mouth daily., Disp: ,  Rfl:  .  chlorzoxazone (PARAFON) 500 MG tablet, TAKE ONE TABLET AT BED- TIME AS NEEDED., Disp: 30 tablet, Rfl: 0 .  Cholecalciferol (D3-1000 PO), Take 1,000 Units by mouth daily. , Disp: , Rfl:  .  Cyanocobalamin (B-12) 1000 MCG/ML KIT, Inject 1,000 mcg as directed every 30 (thirty) days. , Disp: , Rfl:  .  GLUCOSAMINE-CHONDROITIN PO, Take 1 tablet by mouth 2 (two) times daily. , Disp: , Rfl:  .  Magnesium Oxide 400 (240 Mg) MG TABS, Take 1 tablet (400 mg total) by mouth 2 (two) times daily., Disp: 180 tablet, Rfl: 1 .  Mesalamine (DELZICOL) 400 MG CPDR, Take 400 mg by mouth 2 (two) times daily. , Disp: , Rfl:  .  pantoprazole (PROTONIX) 40 MG tablet, Take 40 mg by mouth 2 (two) times daily. , Disp: , Rfl:  .  Polyethyl Glycol-Propyl Glycol (SYSTANE OP), Take 1 drop by mouth daily as needed (for dry eyes)., Disp: , Rfl:  .  SYNTHROID 75 MCG tablet, TAKE ONE TABLET DAILY BEFORE BREAKFAST., Disp: 90 tablet, Rfl: 0 .  Wheat Dextrin (BENEFIBER DRINK MIX) PACK, Take 1 each by mouth daily. , Disp: , Rfl:  .  zolpidem (AMBIEN CR) 6.25 MG CR tablet, TAKE 1 TABLET BY MOUTH EVERY DAY AT BEDTIME AS NEEDED, Disp: 30 tablet, Rfl: 0  EXAM:  VITALS per patient if applicable: [phone]  GENERAL: alert.  Sounds to be in no acute distress.  Answering questions appropriately.    PSYCH/NEURO: pleasant and cooperative, no obvious depression or anxiety, speech and thought processing grossly intact  ASSESSMENT AND PLAN:  Discussed the following assessment and plan:  Abnormal liver function tests Has seen GI.  S/p previous abdominal ultrasound.  Follow liver function tests.    Anemia Previously evaluated by hematology.  Found to be B12 and iron deficient.  Follow cbc.   Autoimmune hepatitis ([place]) Being followed by GI.  Follow liver function tests.    B12 deficiency Continue B12 injections.    Barrett's esophagus with esophagitis Followed by GI.  Upper symptoms controlled.  EGD 12/2018.  Recommended  f/u in 2 years.    Hyperglycemia Low carb diet and exercise.  Follow met b and a1c.   Hypertension Blood pressure under good control.  Continue same medication regimen.  Follow pressures.  Follow metabolic panel.    Hypothyroidism On thyroid replacement.  Follow tsh.   Leg cramps Continue magnesium.  Stretches.  Follow.    Orders Placed This Encounter  Procedures  . Hemoglobin A1c    Standing Status:   Future    Standing Expiration Date:   02/06/2021  . Hepatic function panel    Standing Status:   Future    Standing Expiration Date:   02/06/2021  . Lipid panel    Standing Status:   Future    Standing Expiration Date:   02/06/2021  . TSH    Standing Status:   Future    Standing Expiration Date:   02/06/2021  . Basic metabolic panel    Standing Status:   Future    Standing Expiration Date:   02/06/2021  Meds ordered this encounter  Medications  . chlorzoxazone (PARAFON) 500 MG tablet    Sig: TAKE ONE TABLET AT BED- TIME AS NEEDED.    Dispense:  30 tablet    Refill:  0     I discussed the assessment and treatment plan with the patient. The patient was provided an opportunity to ask questions and all were answered. The patient agreed with the plan and demonstrated an understanding of the instructions.   The patient was advised to call back or seek an in-person evaluation if the symptoms worsen or if the condition fails to improve as anticipated.  I provided 22 minutes of non-face-to-face time during this encounter.   Einar Pheasant, MD

## 2020-02-04 NOTE — Telephone Encounter (Signed)
Noted  

## 2020-02-04 NOTE — Telephone Encounter (Signed)
Lm to schedule fasting labs in 2-3 weeks and a 66m follow up

## 2020-02-07 ENCOUNTER — Encounter: Payer: Self-pay | Admitting: Internal Medicine

## 2020-02-07 NOTE — Assessment & Plan Note (Signed)
Has seen GI.  S/p previous abdominal ultrasound.  Follow liver function tests.

## 2020-02-07 NOTE — Assessment & Plan Note (Signed)
Followed by GI.  Upper symptoms controlled.  EGD 12/2018.  Recommended f/u in 2 years.   

## 2020-02-07 NOTE — Assessment & Plan Note (Signed)
Continue B12 injections.   

## 2020-02-07 NOTE — Assessment & Plan Note (Signed)
On thyroid replacement.  Follow tsh.  

## 2020-02-07 NOTE — Assessment & Plan Note (Signed)
Previously evaluated by hematology.  Found to be B12 and iron deficient.  Follow cbc.

## 2020-02-07 NOTE — Assessment & Plan Note (Signed)
Continue magnesium.  Stretches.  Follow.

## 2020-02-07 NOTE — Assessment & Plan Note (Signed)
Blood pressure under good control.  Continue same medication regimen.  Follow pressures.  Follow metabolic panel.   

## 2020-02-07 NOTE — Assessment & Plan Note (Signed)
Being followed by GI.  Follow liver function tests.   

## 2020-02-07 NOTE — Assessment & Plan Note (Signed)
Low carb diet and exercise.  Follow met b and a1c.  

## 2020-02-08 NOTE — Telephone Encounter (Signed)
Pt called in and is scheduled 02/23/20 for labs and 06/07/20 for 4 month follow up.

## 2020-02-11 ENCOUNTER — Other Ambulatory Visit: Payer: Self-pay | Admitting: Gastroenterology

## 2020-02-11 DIAGNOSIS — K7460 Unspecified cirrhosis of liver: Secondary | ICD-10-CM

## 2020-02-11 DIAGNOSIS — K754 Autoimmune hepatitis: Secondary | ICD-10-CM | POA: Diagnosis not present

## 2020-02-11 DIAGNOSIS — K2270 Barrett's esophagus without dysplasia: Secondary | ICD-10-CM | POA: Diagnosis not present

## 2020-02-11 DIAGNOSIS — K52831 Collagenous colitis: Secondary | ICD-10-CM | POA: Diagnosis not present

## 2020-02-16 ENCOUNTER — Ambulatory Visit
Admission: RE | Admit: 2020-02-16 | Discharge: 2020-02-16 | Disposition: A | Discharge status: Home / Self Care | Payer: Medicare Other | Source: Ambulatory Visit | Attending: Gastroenterology | Admitting: Gastroenterology

## 2020-02-16 ENCOUNTER — Other Ambulatory Visit: Payer: Self-pay

## 2020-02-16 DIAGNOSIS — K7460 Unspecified cirrhosis of liver: Secondary | ICD-10-CM | POA: Diagnosis not present

## 2020-02-16 DIAGNOSIS — K7689 Other specified diseases of liver: Secondary | ICD-10-CM | POA: Diagnosis not present

## 2020-02-23 ENCOUNTER — Ambulatory Visit (INDEPENDENT_AMBULATORY_CARE_PROVIDER_SITE_OTHER): Payer: Medicare Other

## 2020-02-23 ENCOUNTER — Other Ambulatory Visit: Payer: Self-pay

## 2020-02-23 ENCOUNTER — Other Ambulatory Visit (INDEPENDENT_AMBULATORY_CARE_PROVIDER_SITE_OTHER): Payer: Medicare Other

## 2020-02-23 DIAGNOSIS — R945 Abnormal results of liver function studies: Secondary | ICD-10-CM | POA: Diagnosis not present

## 2020-02-23 DIAGNOSIS — I10 Essential (primary) hypertension: Secondary | ICD-10-CM | POA: Diagnosis not present

## 2020-02-23 DIAGNOSIS — E039 Hypothyroidism, unspecified: Secondary | ICD-10-CM | POA: Diagnosis not present

## 2020-02-23 DIAGNOSIS — R739 Hyperglycemia, unspecified: Secondary | ICD-10-CM

## 2020-02-23 DIAGNOSIS — R7989 Other specified abnormal findings of blood chemistry: Secondary | ICD-10-CM

## 2020-02-23 DIAGNOSIS — E538 Deficiency of other specified B group vitamins: Secondary | ICD-10-CM | POA: Diagnosis not present

## 2020-02-23 LAB — BASIC METABOLIC PANEL
BUN: 24 mg/dL — ABNORMAL HIGH (ref 6–23)
CO2: 26 mEq/L (ref 19–32)
Calcium: 9.9 mg/dL (ref 8.4–10.5)
Chloride: 104 mEq/L (ref 96–112)
Creatinine, Ser: 0.94 mg/dL (ref 0.40–1.20)
GFR: 56.81 mL/min — ABNORMAL LOW (ref >60.00)
Glucose, Bld: 100 mg/dL — ABNORMAL HIGH (ref 70–99)
Potassium: 4 mEq/L (ref 3.5–5.1)
Sodium: 137 mEq/L (ref 135–145)

## 2020-02-23 LAB — LIPID PANEL
Cholesterol: 157 mg/dL (ref 0–200)
HDL: 40.9 mg/dL (ref >39.00)
LDL Cholesterol: 92 mg/dL (ref 0–99)
NonHDL: 116.16
Total CHOL/HDL Ratio: 4
Triglycerides: 120 mg/dL (ref 0.0–149.0)
VLDL: 24 mg/dL (ref 0.0–40.0)

## 2020-02-23 LAB — HEPATIC FUNCTION PANEL
ALT: 12 U/L (ref 0–35)
AST: 16 U/L (ref 0–37)
Albumin: 4.2 g/dL (ref 3.5–5.2)
Alkaline Phosphatase: 48 U/L (ref 39–117)
Bilirubin, Direct: 0.2 mg/dL (ref 0.0–0.3)
Total Bilirubin: 1.3 mg/dL — ABNORMAL HIGH (ref 0.2–1.2)
Total Protein: 7.5 g/dL (ref 6.0–8.3)

## 2020-02-23 LAB — TSH: TSH: 4.61 u[IU]/mL — ABNORMAL HIGH (ref 0.35–4.50)

## 2020-02-23 MED ORDER — ATENOLOL 25 MG PO TABS: ORAL_TABLET | ORAL | 0 refills | Status: DC

## 2020-02-23 MED ORDER — CYANOCOBALAMIN 1000 MCG/ML IJ SOLN
1000.0000 ug | Freq: Once | INTRAMUSCULAR | Status: AC
  Administered 2020-02-23: 11:00:00 1000 ug via INTRAMUSCULAR

## 2020-02-23 MED ORDER — BENAZEPRIL HCL 20 MG PO TABS: 20.0000 mg | ORAL_TABLET | Freq: Every day | ORAL | 1 refills | Status: DC

## 2020-02-23 NOTE — Progress Notes (Addendum)
Patient presented for B 12 injection to right deltoid, patient voiced no concerns nor showed any signs of distress during injection.  Reviewed.  Dr Scott 

## 2020-02-25 LAB — HEMOGLOBIN A1C: Hgb A1c MFr Bld: 5.5 % (ref 4.6–6.5)

## 2020-02-29 ENCOUNTER — Other Ambulatory Visit: Payer: Self-pay | Admitting: Lab

## 2020-03-01 ENCOUNTER — Other Ambulatory Visit: Payer: Self-pay | Admitting: Internal Medicine

## 2020-03-01 DIAGNOSIS — E039 Hypothyroidism, unspecified: Secondary | ICD-10-CM

## 2020-03-01 MED ORDER — LEVOTHYROXINE SODIUM 88 MCG PO TABS: 88.0000 ug | ORAL_TABLET | Freq: Every day | ORAL | 1 refills | Status: DC

## 2020-03-01 NOTE — Telephone Encounter (Signed)
Refilled: 01/28/2020 Last OV: 02/04/2020 Next OV: 06/07/2020

## 2020-03-01 NOTE — Progress Notes (Signed)
Order placed for f/u tsh and sent in rx for synthroid 28mcg #90 with one refill.

## 2020-03-02 NOTE — Telephone Encounter (Signed)
rx ok'd for ambien #30 with one refill.   

## 2020-03-08 IMAGING — MG MM DIGITAL DIAGNOSTIC BILAT W/ TOMO W/ CAD
6 of 10 series · 6 of 30 positions shown · non-contrast
Comparison: Previous exam(s).

CLINICAL DATA: Patient presents for bilateral diagnostic
examination due to focal tenderness over the 12 o'clock position of
the left areolar region. Patient states onset of symptoms 2-3 weeks
ago but has since resolved. No focal palpable abnormality. Patient
is due for her annual bilateral mammogram.

EXAM:
DIGITAL DIAGNOSTIC bilateral MAMMOGRAM WITH CAD AND TOMO
ULTRASOUND left BREAST

[L CC synth-2D]
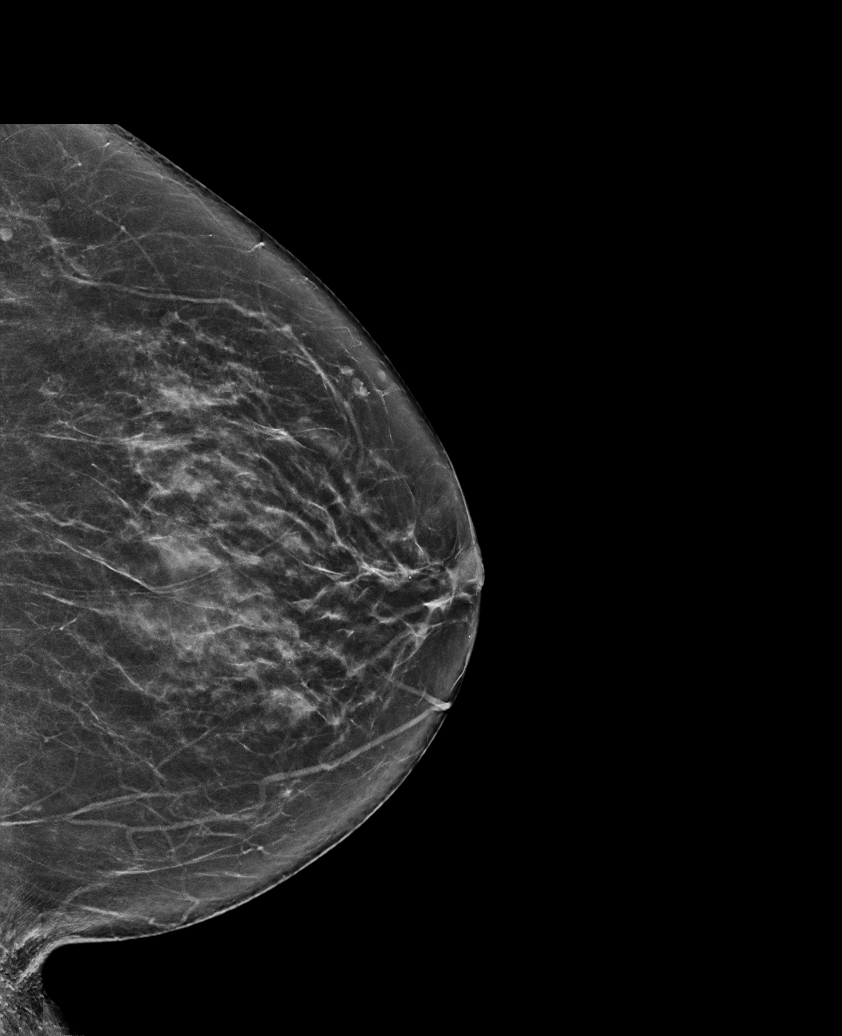

[R CC synth-2D]
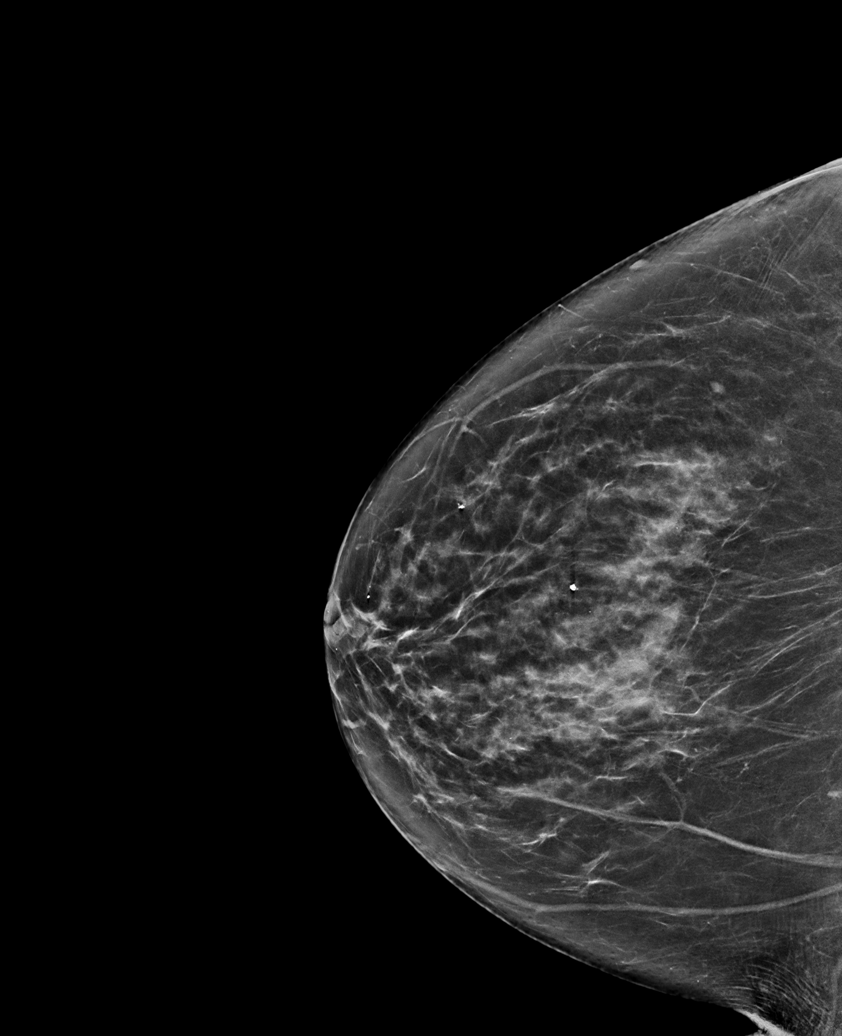

[R MLO synth-2D]
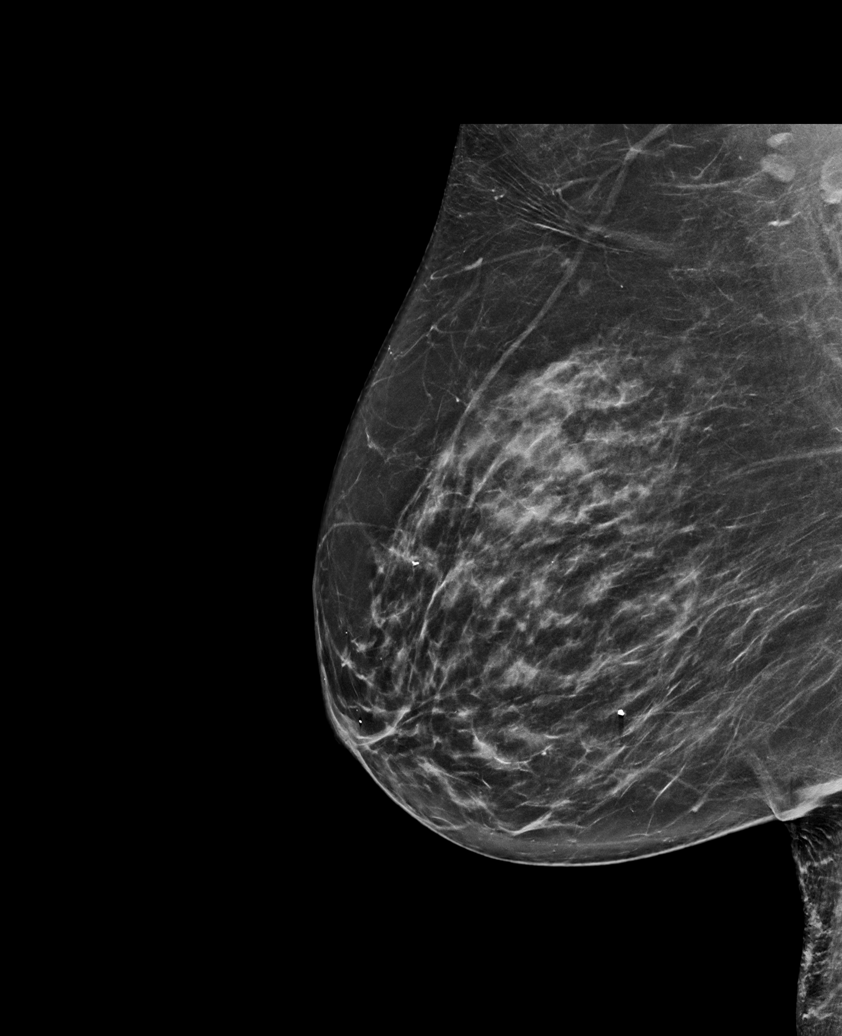

[L MLO synth-2D (1 of 2)]
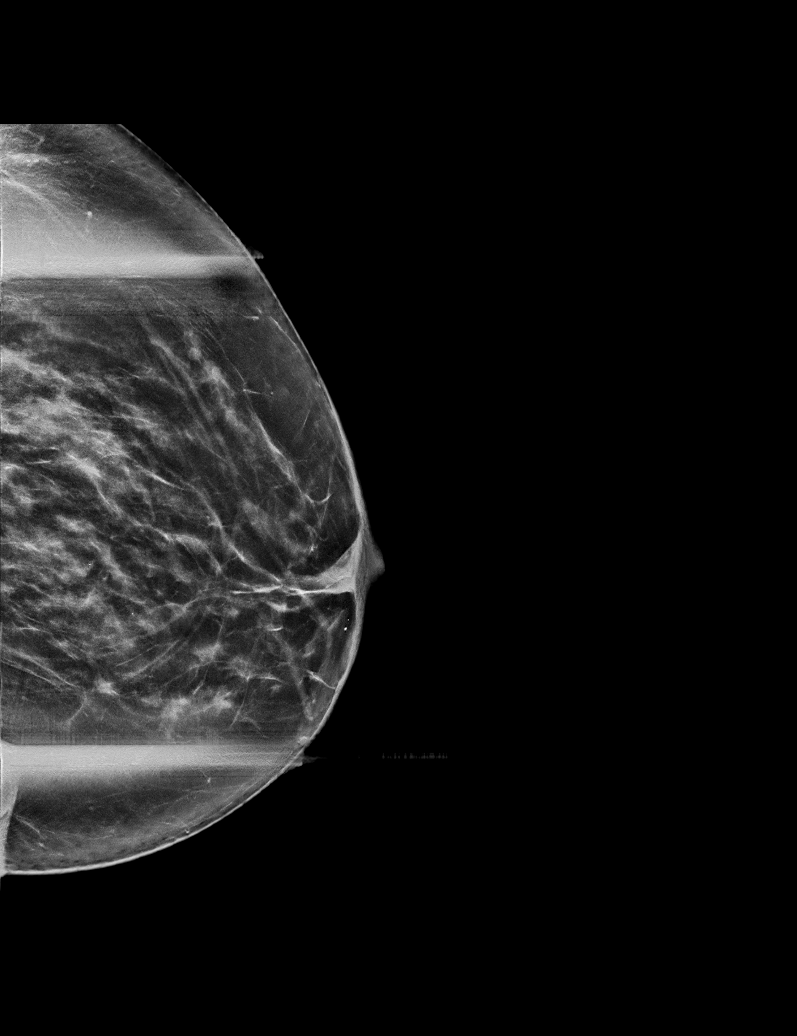

[L MLO synth-2D (2 of 2)]
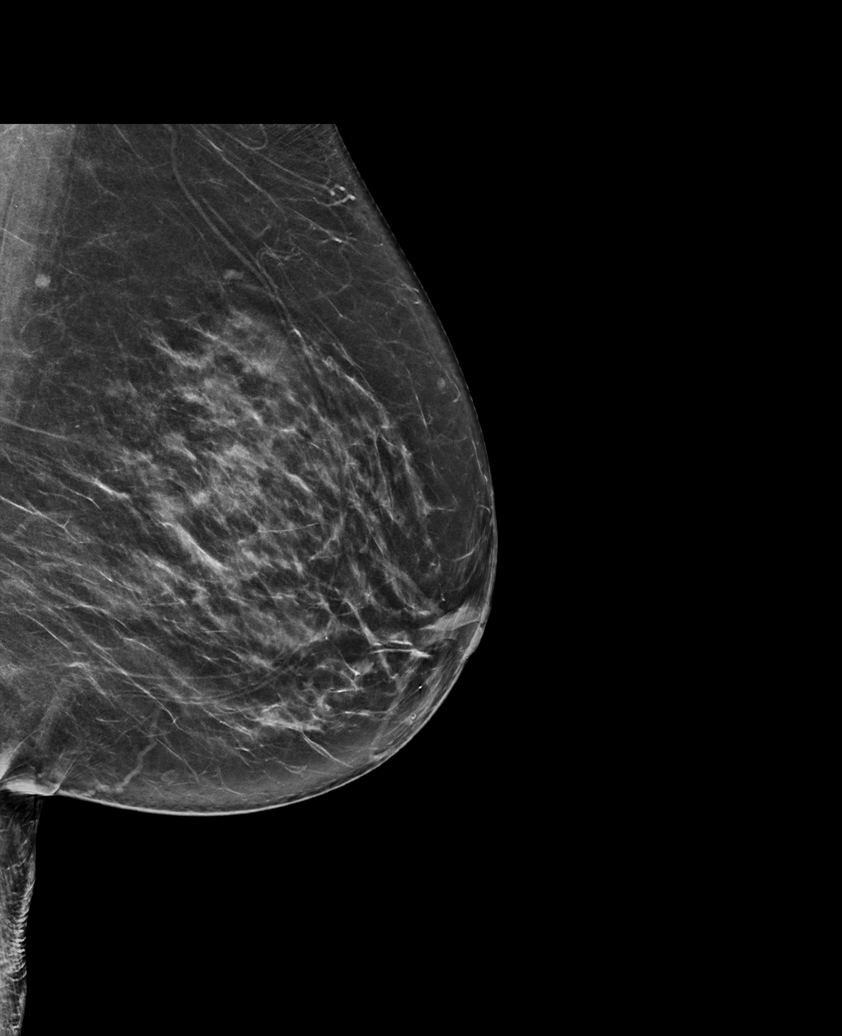

[L CC tomo · tomo slice 35/69.0]
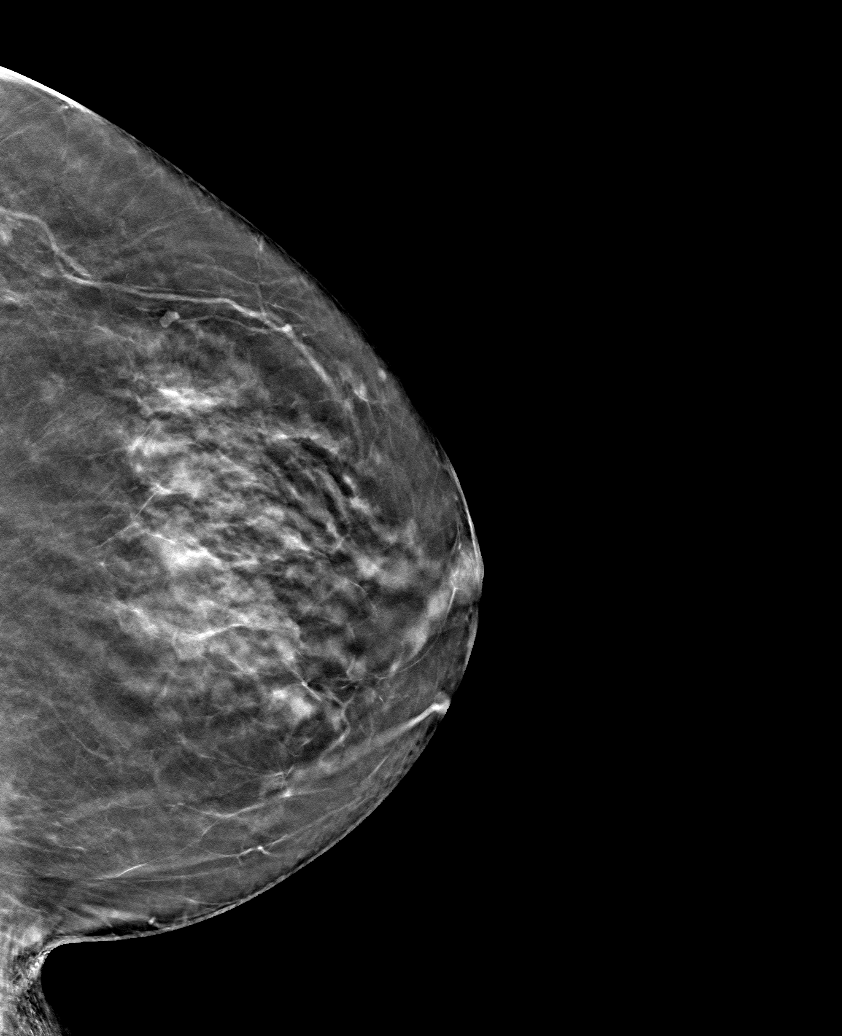

[6 of 30 positions shown; findings below may reference images not displayed]

ACR Breast Density Category c: The breast tissue is heterogeneously
dense, which may obscure small masses.
FINDINGS: Examination demonstrates no focal abnormality over the upper central
left periareolar region to account for patient's focal
tenderness/pain. Remainder the exam is unchanged.

Mammographic images were processed with CAD.

Targeted ultrasound is performed, showing no focal abnormality over
the upper central left periareolar region to account for patient's
pain/tenderness.
IMPRESSION: Stable mammogram. No focal abnormality over the upper central left
periareolar region to account for patient's pain/tenderness.

RECOMMENDATION:
Recommend continued annual bilateral screening mammographic
follow-up. Recommend continued management of patient's left breast
pain/tenderness on a clinical basis.

I have discussed the findings and recommendations with the patient.
Results were also provided in writing at the conclusion of the
visit. If applicable, a reminder letter will be sent to the patient
regarding the next appointment.

BI-RADS CATEGORY  1: Negative.

## 2020-03-10 ENCOUNTER — Other Ambulatory Visit: Payer: Self-pay | Admitting: Internal Medicine

## 2020-03-22 ENCOUNTER — Ambulatory Visit: Payer: Medicare Other

## 2020-03-29 ENCOUNTER — Ambulatory Visit (INDEPENDENT_AMBULATORY_CARE_PROVIDER_SITE_OTHER): Payer: Medicare Other

## 2020-03-29 ENCOUNTER — Other Ambulatory Visit: Payer: Self-pay

## 2020-03-29 DIAGNOSIS — E538 Deficiency of other specified B group vitamins: Secondary | ICD-10-CM | POA: Diagnosis not present

## 2020-03-29 MED ORDER — CYANOCOBALAMIN 1000 MCG/ML IJ SOLN
1000.0000 ug | Freq: Once | INTRAMUSCULAR | Status: AC
  Administered 2020-03-29: 1000 ug via INTRAMUSCULAR

## 2020-03-29 NOTE — Progress Notes (Addendum)
Patient presented for B 12 injection to left deltoid, patient voiced no concerns nor showed any signs of distress during injection.  Reviewed.  Dr Scott 

## 2020-04-04 ENCOUNTER — Other Ambulatory Visit: Payer: Self-pay | Admitting: Internal Medicine

## 2020-04-04 DIAGNOSIS — R252 Cramp and spasm: Secondary | ICD-10-CM

## 2020-04-11 ENCOUNTER — Other Ambulatory Visit: Payer: Self-pay

## 2020-04-11 ENCOUNTER — Other Ambulatory Visit (INDEPENDENT_AMBULATORY_CARE_PROVIDER_SITE_OTHER): Payer: Medicare Other

## 2020-04-11 DIAGNOSIS — E039 Hypothyroidism, unspecified: Secondary | ICD-10-CM

## 2020-04-11 LAB — TSH: TSH: 1.2 u[IU]/mL (ref 0.35–4.50)

## 2020-04-12 ENCOUNTER — Encounter: Payer: Self-pay | Admitting: Internal Medicine

## 2020-05-02 ENCOUNTER — Other Ambulatory Visit: Payer: Self-pay | Admitting: Internal Medicine

## 2020-05-02 NOTE — Telephone Encounter (Signed)
Refill request for Lori [name], last seen 02-04-20, last filled 03-02-20.  Please advise.

## 2020-05-02 NOTE — Telephone Encounter (Signed)
rx sent in for ambien #30 with one refill.  °

## 2020-05-03 ENCOUNTER — Ambulatory Visit: Payer: Medicare Other

## 2020-05-05 ENCOUNTER — Other Ambulatory Visit: Payer: Self-pay

## 2020-05-05 ENCOUNTER — Ambulatory Visit (INDEPENDENT_AMBULATORY_CARE_PROVIDER_SITE_OTHER): Payer: Medicare Other

## 2020-05-05 DIAGNOSIS — E538 Deficiency of other specified B group vitamins: Secondary | ICD-10-CM

## 2020-05-05 MED ORDER — CYANOCOBALAMIN 1000 MCG/ML IJ SOLN
1000.0000 ug | Freq: Once | INTRAMUSCULAR | Status: AC
  Administered 2020-05-05: 1000 ug via INTRAMUSCULAR

## 2020-05-05 NOTE — Progress Notes (Addendum)
Patient presented for B 12 injection to left deltoid, patient voiced no concerns nor showed any signs of distress during injection.  Reviewed.  Dr Scott 

## 2020-05-10 DIAGNOSIS — I10 Essential (primary) hypertension: Secondary | ICD-10-CM | POA: Diagnosis not present

## 2020-05-10 DIAGNOSIS — R001 Bradycardia, unspecified: Secondary | ICD-10-CM | POA: Diagnosis not present

## 2020-05-10 DIAGNOSIS — I491 Atrial premature depolarization: Secondary | ICD-10-CM | POA: Diagnosis not present

## 2020-06-01 ENCOUNTER — Ambulatory Visit: Payer: Medicare Other

## 2020-06-04 ENCOUNTER — Other Ambulatory Visit: Payer: Self-pay | Admitting: Internal Medicine

## 2020-06-07 ENCOUNTER — Ambulatory Visit (INDEPENDENT_AMBULATORY_CARE_PROVIDER_SITE_OTHER): Payer: Medicare Other | Admitting: Internal Medicine

## 2020-06-07 ENCOUNTER — Encounter: Payer: Self-pay | Admitting: Internal Medicine

## 2020-06-07 ENCOUNTER — Other Ambulatory Visit: Payer: Self-pay

## 2020-06-07 VITALS — BP 122/74 | HR 57 | Temp 97.1°F | Resp 16 | Ht 61.0 in | Wt 153.0 lb

## 2020-06-07 DIAGNOSIS — D649 Anemia, unspecified: Secondary | ICD-10-CM | POA: Diagnosis not present

## 2020-06-07 DIAGNOSIS — K209 Esophagitis, unspecified without bleeding: Secondary | ICD-10-CM

## 2020-06-07 DIAGNOSIS — Z1231 Encounter for screening mammogram for malignant neoplasm of breast: Secondary | ICD-10-CM

## 2020-06-07 DIAGNOSIS — I10 Essential (primary) hypertension: Secondary | ICD-10-CM

## 2020-06-07 DIAGNOSIS — E039 Hypothyroidism, unspecified: Secondary | ICD-10-CM | POA: Diagnosis not present

## 2020-06-07 DIAGNOSIS — K754 Autoimmune hepatitis: Secondary | ICD-10-CM

## 2020-06-07 DIAGNOSIS — R7989 Other specified abnormal findings of blood chemistry: Secondary | ICD-10-CM

## 2020-06-07 DIAGNOSIS — R945 Abnormal results of liver function studies: Secondary | ICD-10-CM | POA: Diagnosis not present

## 2020-06-07 DIAGNOSIS — R739 Hyperglycemia, unspecified: Secondary | ICD-10-CM

## 2020-06-07 DIAGNOSIS — R252 Cramp and spasm: Secondary | ICD-10-CM

## 2020-06-07 DIAGNOSIS — E538 Deficiency of other specified B group vitamins: Secondary | ICD-10-CM | POA: Diagnosis not present

## 2020-06-07 DIAGNOSIS — K2270 Barrett's esophagus without dysplasia: Secondary | ICD-10-CM

## 2020-06-07 DIAGNOSIS — D472 Monoclonal gammopathy: Secondary | ICD-10-CM

## 2020-06-07 LAB — BASIC METABOLIC PANEL
BUN: 31 mg/dL — ABNORMAL HIGH (ref 6–23)
CO2: 26 mEq/L (ref 19–32)
Calcium: 9.5 mg/dL (ref 8.4–10.5)
Chloride: 106 mEq/L (ref 96–112)
Creatinine, Ser: 1.07 mg/dL (ref 0.40–1.20)
GFR: 48.89 mL/min — ABNORMAL LOW (ref >60.00)
Glucose, Bld: 114 mg/dL — ABNORMAL HIGH (ref 70–99)
Potassium: 4.1 mEq/L (ref 3.5–5.1)
Sodium: 138 mEq/L (ref 135–145)

## 2020-06-07 LAB — CBC WITH DIFFERENTIAL/PLATELET
Basophils Absolute: 0.1 10*3/uL (ref 0.0–0.1)
Basophils Relative: 1.3 % (ref 0.0–3.0)
Eosinophils Absolute: 0.1 10*3/uL (ref 0.0–0.7)
Eosinophils Relative: 1.8 % (ref 0.0–5.0)
HCT: 38.3 % (ref 36.0–46.0)
Hemoglobin: 12.9 g/dL (ref 12.0–15.0)
Lymphocytes Relative: 15.5 % (ref 12.0–46.0)
Lymphs Abs: 0.8 10*3/uL (ref 0.7–4.0)
MCHC: 33.6 g/dL (ref 30.0–36.0)
MCV: 89.9 fl (ref 78.0–100.0)
Monocytes Absolute: 0.4 10*3/uL (ref 0.1–1.0)
Monocytes Relative: 8.1 % (ref 3.0–12.0)
Neutro Abs: 4 10*3/uL (ref 1.4–7.7)
Neutrophils Relative %: 73.3 % (ref 43.0–77.0)
Platelets: 213 10*3/uL (ref 150.0–400.0)
RBC: 4.26 Mil/uL (ref 3.87–5.11)
RDW: 13.3 % (ref 11.5–15.5)
WBC: 5.4 10*3/uL (ref 4.0–10.5)

## 2020-06-07 LAB — LIPID PANEL
Cholesterol: 154 mg/dL (ref 0–200)
HDL: 43.1 mg/dL (ref >39.00)
LDL Cholesterol: 87 mg/dL (ref 0–99)
NonHDL: 110.64
Total CHOL/HDL Ratio: 4
Triglycerides: 118 mg/dL (ref 0.0–149.0)
VLDL: 23.6 mg/dL (ref 0.0–40.0)

## 2020-06-07 LAB — IBC + FERRITIN
Ferritin: 47.4 ng/mL (ref 10.0–291.0)
Iron: 121 ug/dL (ref 42–145)
Saturation Ratios: 30.8 % (ref 20.0–50.0)
Transferrin: 281 mg/dL (ref 212.0–360.0)

## 2020-06-07 LAB — PROTIME-INR
INR: 1 ratio (ref 0.8–1.0)
Prothrombin Time: 11.6 s (ref 9.6–13.1)

## 2020-06-07 LAB — HEPATIC FUNCTION PANEL
ALT: 10 U/L (ref 0–35)
AST: 14 U/L (ref 0–37)
Albumin: 4.3 g/dL (ref 3.5–5.2)
Alkaline Phosphatase: 46 U/L (ref 39–117)
Bilirubin, Direct: 0.2 mg/dL (ref 0.0–0.3)
Total Bilirubin: 1.7 mg/dL — ABNORMAL HIGH (ref 0.2–1.2)
Total Protein: 7.2 g/dL (ref 6.0–8.3)

## 2020-06-07 LAB — TSH: TSH: 0.69 u[IU]/mL (ref 0.35–4.50)

## 2020-06-07 LAB — VITAMIN B12: Vitamin B-12: 357 pg/mL (ref 211–911)

## 2020-06-07 LAB — HEMOGLOBIN A1C: Hgb A1c MFr Bld: 5.9 % (ref 4.6–6.5)

## 2020-06-07 NOTE — Progress Notes (Signed)
Patient ID: Lori [name], female   DOB: [birth year], 86 y.o.   MRN: [id number]   Subjective:    Patient ID: Lori [name], female    DOB: Jun 04, 1934, 86 y.o.   MRN: [id number]  HPI This visit occurred during the SARS-CoV-2 public health emergency.  Safety protocols were in place, including screening questions prior to the visit, additional usage of staff PPE, and extensive cleaning of exam room while observing appropriate contact time as indicated for disinfecting solutions.  Patient here for a scheduled follow up.  She reports she is doing relatively well.  Does report leg pain, stiffness and cramping.  Notices when she gets up - legs stiff.  Tylenol helps.  Trying to exercise.  Leg cramps - not as often.  Takes parafon forte prn.  Drinking pedialite.  Eating.  No nausea or vomiting.  Bowels moving.  Saw cardiology 05/11/2147 hour holter - sinus bradycardia.  ETT negative for ischemia.  Recommended f/u in 6 months.  No chest pain or sob reported.  No abdominal pain.  Bowels moving.  Overall things appear to be stable from GI standpint.     Past Medical History:  Diagnosis Date  . Anemia    iron deficient  . Arthritis   . Autoimmune hepatitis ([place]) 05/10/2016   Hep C  . B12 deficiency   . Barrett esophagus 01/2016  . Bradycardia   . Diverticulosis   . GERD (gastroesophageal reflux disease)   . Hypertension   . Hypothyroidism   . Ulcer disease    Past Surgical History:  Procedure Laterality Date  . CHOLECYSTECTOMY N/A 06/25/2018   Procedure: LAPAROSCOPIC CHOLECYSTECTOMY WITH INTRAOPERATIVE CHOLANGIOGRAM;  Surgeon: Robert Bellow, MD;  Location: [facility] ORS;  Service: General;  Laterality: N/A;  . ESOPHAGOGASTRODUODENOSCOPY (EGD) WITH PROPOFOL N/A 01/30/2016   Procedure: ESOPHAGOGASTRODUODENOSCOPY (EGD) WITH PROPOFOL;  Surgeon: Lollie Sails, MD;  Location: [facility] ENDOSCOPY;  Service: Endoscopy;  Laterality: N/A;  . ESOPHAGOGASTRODUODENOSCOPY (EGD) WITH PROPOFOL N/A  01/14/2018   Procedure: ESOPHAGOGASTRODUODENOSCOPY (EGD) WITH PROPOFOL;  Surgeon: Lollie Sails, MD;  Location: [facility] ENDOSCOPY;  Service: Endoscopy;  Laterality: N/A;  . ESOPHAGOGASTRODUODENOSCOPY (EGD) WITH PROPOFOL N/A 05/20/2018   Procedure: ESOPHAGOGASTRODUODENOSCOPY (EGD) WITH PROPOFOL;  Surgeon: Lollie Sails, MD;  Location: [facility] ENDOSCOPY;  Service: Endoscopy;  Laterality: N/A;  . ESOPHAGOGASTRODUODENOSCOPY (EGD) WITH PROPOFOL N/A 01/13/2019   Procedure: ESOPHAGOGASTRODUODENOSCOPY (EGD) WITH PROPOFOL;  Surgeon: Lollie Sails, MD;  Location: [facility] ENDOSCOPY;  Service: Endoscopy;  Laterality: N/A;  . HERNIA REPAIR    . lap hiatal hernia repair  07/08/08  . LEFT OOPHORECTOMY  1993   benign ovarian cyst  . TUBAL LIGATION  1981   Family History  Problem Relation Age of Onset  . Stroke Mother   . Breast cancer Neg Hx   . Colon cancer Neg Hx    Social History   Socioeconomic History  . Marital status: Widowed    Spouse name: Not on file  . Number of children: 0  . Years of education: Not on file  . Highest education level: Not on file  Occupational History  . Not on file  Tobacco Use  . Smoking status: Never Smoker  . Smokeless tobacco: Never Used  Vaping Use  . Vaping Use: Never used  Substance and Sexual Activity  . Alcohol use: No    Alcohol/week: 0.0 standard drinks  . Drug use: No  . Sexual activity: Never  Other Topics Concern  . Not on file  Social History  Narrative  . Not on file   Social Determinants of Health   Financial Resource Strain:   . Difficulty of Paying Living Expenses:   Food Insecurity:   . Worried About Charity fundraiser in the Last Year:   . Arboriculturist in the Last Year:   Transportation Needs:   . Film/video editor (Medical):   Marland Kitchen Lack of Transportation (Non-Medical):   Physical Activity:   . Days of Exercise per Week:   . Minutes of Exercise per Session:   Stress:   . Feeling of Stress :   Social Connections:   .  Frequency of Communication with Friends and Family:   . Frequency of Social Gatherings with Friends and Family:   . Attends Religious Services:   . Active Member of Clubs or Organizations:   . Attends Archivist Meetings:   Marland Kitchen Marital Status:     Outpatient Encounter Medications as of 06/07/2020  Medication Sig  . aspirin 81 MG tablet Take 81 mg by mouth daily.  Marland Kitchen atenolol (TENORMIN) 25 MG tablet TAKE 1/2 TABLET BY MOUTH EVERY DAY  . benazepril (LOTENSIN) 20 MG tablet Take 1 tablet (20 mg total) by mouth daily.  . calcitonin, salmon, (MIACALCIN/FORTICAL) 200 UNIT/ACT nasal spray USE 1 SPRAY IN ONE NOSTRIL DAILY. ALTERNATE NOSTRILS.  Marland Kitchen Calcium-Phosphorus-Vitamin D (CITRACAL +D3 PO) Take 1 tablet by mouth daily.  . chlorzoxazone (PARAFON) 500 MG tablet TAKE 1 TABLET BY MOUTH AT BEDTIME AS NEEDED  . Cholecalciferol (D3-1000 PO) Take 1,000 Units by mouth daily.   . Cyanocobalamin (B-12) 1000 MCG/ML KIT Inject 1,000 mcg as directed every 30 (thirty) days.   Marland Kitchen GLUCOSAMINE-CHONDROITIN PO Take 1 tablet by mouth 2 (two) times daily.   Marland Kitchen levothyroxine (SYNTHROID) 88 MCG tablet Take 1 tablet (88 mcg total) by mouth daily.  . Magnesium Oxide 400 (240 Mg) MG TABS Take 1 tablet (400 mg total) by mouth 2 (two) times daily.  . Mesalamine (DELZICOL) 400 MG CPDR Take 400 mg by mouth 2 (two) times daily.   . pantoprazole (PROTONIX) 40 MG tablet Take 40 mg by mouth 2 (two) times daily.   Vladimir Faster Glycol-Propyl Glycol (SYSTANE OP) Take 1 drop by mouth daily as needed (for dry eyes).  . Wheat Dextrin (BENEFIBER DRINK MIX) PACK Take 1 each by mouth daily.   Marland Kitchen zolpidem (AMBIEN CR) 6.25 MG CR tablet TAKE 1 TABLET BY MOUTH EVERY DAY AT BEDTIME AS NEEDED   No facility-administered encounter medications on file as of 06/07/2020.    Review of Systems  Constitutional: Negative for appetite change and unexpected weight change.  HENT: Negative for congestion and sinus pressure.   Respiratory: Negative for  cough, chest tightness and shortness of breath.   Cardiovascular: Negative for chest pain, palpitations and leg swelling.  Gastrointestinal: Negative for abdominal pain, diarrhea, nausea and vomiting.  Genitourinary: Negative for difficulty urinating and dysuria.  Musculoskeletal: Negative for joint swelling.       Leg aching and stiffness as outlined.  Still some intermittent cramping as outlined.    Skin: Negative for color change and rash.  Neurological: Negative for dizziness, light-headedness and headaches.  Psychiatric/Behavioral: Negative for agitation and dysphoric mood.       Objective:    Physical Exam Vitals reviewed.  Constitutional:      General: She is not in acute distress.    Appearance: Normal appearance.  HENT:     Head: Normocephalic and atraumatic.  Right Ear: External ear normal.     Left Ear: External ear normal.  Eyes:     General: No scleral icterus.       Right eye: No discharge.        Left eye: No discharge.     Conjunctiva/sclera: Conjunctivae normal.  Neck:     Thyroid: No thyromegaly.  Cardiovascular:     Rate and Rhythm: Normal rate and regular rhythm.  Pulmonary:     Effort: No respiratory distress.     Breath sounds: Normal breath sounds. No wheezing.  Abdominal:     General: Bowel sounds are normal.     Palpations: Abdomen is soft.     Tenderness: There is no abdominal tenderness.  Musculoskeletal:        General: No swelling or tenderness.     Cervical back: Neck supple. No tenderness.  Lymphadenopathy:     Cervical: No cervical adenopathy.  Skin:    Findings: No erythema or rash.  Neurological:     Mental Status: She is alert.  Psychiatric:        Mood and Affect: Mood normal.        Behavior: Behavior normal.     BP 122/74   Pulse (!) 57   Temp (!) 97.1 F (36.2 C)   Resp 16   Ht '5\' 1"'  (1.549 m)   Wt 153 lb (69.4 kg)   SpO2 98%   BMI 28.91 kg/m  Wt Readings from Last 3 Encounters:  06/07/20 153 lb (69.4 kg)    02/04/20 154 lb (69.9 kg)  09/24/19 158 lb (71.7 kg)     Lab Results  Component Value Date   WBC 5.4 06/07/2020   HGB 12.9 06/07/2020   HCT 38.3 06/07/2020   PLT 213.0 06/07/2020   GLUCOSE 114 (H) 06/07/2020   CHOL 154 06/07/2020   TRIG 118.0 06/07/2020   HDL 43.10 06/07/2020   LDLCALC 87 06/07/2020   ALT 10 06/07/2020   AST 14 06/07/2020   NA 138 06/07/2020   K 4.1 06/07/2020   CL 106 06/07/2020   CREATININE 1.07 06/07/2020   BUN 31 (H) 06/07/2020   CO2 26 06/07/2020   TSH 0.69 06/07/2020   INR 1.0 06/07/2020   HGBA1C 5.9 06/07/2020    US Abdomen Complete  Result Date: 02/16/2020 CLINICAL DATA:  History of cirrhosis EXAM: ABDOMEN ULTRASOUND COMPLETE COMPARISON:  05/12/2019 FINDINGS: Gallbladder: Surgically removed. Common bile duct: Diameter: 9.7 mm consistent with the post cholecystectomy state. Liver: Small 8 mm cyst is noted within the right lobe of the liver. Mild heterogeneity is noted without focal mass. Portal vein is patent on color Doppler imaging with normal direction of blood flow towards the liver. IVC: No abnormality visualized. Pancreas: Visualized portion unremarkable. Spleen: Size and appearance within normal limits. Right Kidney: Length: 0.6 cm. Echogenicity within normal limits. No mass or hydronephrosis visualized. Left Kidney: Length: 9.7 cm. Echogenicity within normal limits. No mass or hydronephrosis visualized. Abdominal aorta: No aneurysm visualized. Other findings: None. IMPRESSION: Small right hepatic cyst. Mild heterogeneity of the liver without focal mass. No significant nodularity is noted at this time. Electronically Signed   By: Inez Catalina M.D.   On: 02/16/2020 11:53       Assessment & Plan:   Problem List Items Addressed This Visit    Abnormal liver function tests   Relevant Orders   Hepatic function panel (Completed)   Protime-INR (Completed)   Anemia    With leg pain, cramping,  recheck cbc and iron studies.  Follow cbc.         Relevant Orders   CBC with Differential/Platelet (Completed)   IBC + Ferritin (Completed)   Autoimmune hepatitis ([place])    Being followed by GI.  Follow liver function tests.        B12 deficiency    Continue B12 injections.       Relevant Orders   Vitamin B12 (Completed)   Barrett's esophagus with esophagitis    Followed by GI.  Upper symptoms controlled.  EGD 12/2018.  Recommended f/u in 2 years.        Hyperbilirubinemia    Followed by GI.       Hyperglycemia    Low carb diet and exercise.  Follow met b and a1c.       Relevant Orders   Hemoglobin A1c (Completed)   Hypertension    Blood pressure under good control.  Continue lotensin.  Follow pressures. Follow metabolic panel.       Relevant Orders   Lipid panel (Completed)   Basic metabolic panel (Completed)   Hypothyroidism    On thyroid replacement.  Follow tsh.       Relevant Orders   TSH (Completed)   Leg cramps    Taking magnesium.  Stretches.  Recheck electrolytes.        MGUS (monoclonal gammopathy of unknown significance)    Was worked up by hematology.  Last check - negative m-spine.  Felt no further hematology w/up warranted.         Other Visit Diagnoses    Visit for screening mammogram    -  Primary   Relevant Orders   MM 3D SCREEN BREAST BILATERAL       Einar Pheasant, MD

## 2020-06-08 ENCOUNTER — Other Ambulatory Visit: Payer: Self-pay | Admitting: Internal Medicine

## 2020-06-08 DIAGNOSIS — R944 Abnormal results of kidney function studies: Secondary | ICD-10-CM

## 2020-06-08 NOTE — Progress Notes (Signed)
Order placed for f/u met b 

## 2020-06-09 NOTE — Telephone Encounter (Signed)
LMTCB

## 2020-06-10 ENCOUNTER — Telehealth: Payer: Self-pay | Admitting: Internal Medicine

## 2020-06-10 NOTE — Telephone Encounter (Signed)
Form signed and placed in box.  Let us know if persistent cramps or problems.

## 2020-06-10 NOTE — Telephone Encounter (Signed)
Spoke with patient. Confirmed doing ok. Had cramp after leaving office. Stated that you were trying to see if she would have one while she was here. No issues since. I have completed handicap form for you to sign.

## 2020-06-10 NOTE — Telephone Encounter (Signed)
Pt was returning call from Thursday

## 2020-06-10 NOTE — Telephone Encounter (Signed)
Pt called in wanting results

## 2020-06-10 NOTE — Telephone Encounter (Signed)
See result note.  

## 2020-06-14 ENCOUNTER — Encounter: Payer: Self-pay | Admitting: Internal Medicine

## 2020-06-14 NOTE — Assessment & Plan Note (Signed)
Was worked up by hematology.  Last check - negative m-spine.  Felt no further hematology w/up warranted.

## 2020-06-14 NOTE — Assessment & Plan Note (Signed)
Followed by GI.  Upper symptoms controlled.  EGD 12/2018.  Recommended f/u in 2 years.

## 2020-06-14 NOTE — Assessment & Plan Note (Signed)
Blood pressure under good control.  Continue lotensin.  Follow pressures. Follow metabolic panel.

## 2020-06-14 NOTE — Assessment & Plan Note (Signed)
Followed by GI

## 2020-06-14 NOTE — Assessment & Plan Note (Signed)
Taking magnesium.  Stretches.  Recheck electrolytes.

## 2020-06-14 NOTE — Assessment & Plan Note (Signed)
Continue B12 injections.   

## 2020-06-14 NOTE — Assessment & Plan Note (Signed)
On thyroid replacement.  Follow tsh.  

## 2020-06-14 NOTE — Assessment & Plan Note (Signed)
Being followed by GI.  Follow liver function tests.   

## 2020-06-14 NOTE — Assessment & Plan Note (Signed)
With leg pain, cramping, recheck cbc and iron studies.  Follow cbc.

## 2020-06-14 NOTE — Assessment & Plan Note (Signed)
Low carb diet and exercise.  Follow met b and a1c.  

## 2020-06-15 ENCOUNTER — Ambulatory Visit (INDEPENDENT_AMBULATORY_CARE_PROVIDER_SITE_OTHER): Payer: Medicare Other

## 2020-06-15 ENCOUNTER — Other Ambulatory Visit: Payer: Self-pay

## 2020-06-15 VITALS — BP 122/74 | HR 57 | Temp 97.1°F | Resp 16 | Ht 61.0 in | Wt 153.0 lb

## 2020-06-15 DIAGNOSIS — Z0000 Encounter for general adult medical examination without abnormal findings: Secondary | ICD-10-CM | POA: Diagnosis not present

## 2020-06-15 NOTE — Telephone Encounter (Signed)
Pt has picked up form 

## 2020-06-15 NOTE — Patient Instructions (Addendum)
  Ms. Podgurski , Thank you for taking time to come for your Medicare Wellness Visit. I appreciate your ongoing commitment to your health goals. Please review the following plan we discussed and let me know if I can assist you in the future.   These are the goals we discussed: Goals    . Healthy Lifestyle     Walk for exercise Stay hydrated Healthy foods       This is a list of the screening recommended for you and due dates:  Health Maintenance  Topic Date Due  . Tetanus Vaccine  06/15/2021*  . Mammogram  07/15/2020  . Flu Shot  07/31/2020  . DEXA scan (bone density measurement)  Completed  . COVID-19 Vaccine  Completed  . Pneumonia vaccines  Completed  *Topic was postponed. The date shown is not the original due date.

## 2020-06-15 NOTE — Progress Notes (Addendum)
Subjective:   Lori [name] is a 86 y.o. female who presents for Medicare Annual (Subsequent) preventive examination.  Review of Systems:  No ROS.  Medicare Wellness Virtual Visit.   Cardiac Risk Factors include: advanced age (>74mn, >>31women);hypertension     Objective:     Vitals: BP 122/74 (Patient Position: Sitting, Cuff Size: Normal)   Pulse (!) 57   Temp (!) 97.1 F (36.2 C)   Resp 16   Ht '5\' 1"'  (1.549 m)   Wt 153 lb (69.4 kg)   SpO2 98%   BMI 28.91 kg/m   Body mass index is 28.91 kg/m.  Advanced Directives 06/15/2020 06/01/2019 01/13/2019 06/25/2018 06/16/2018 05/20/2018 03/27/2018  Does Patient Have a Medical Advance Directive? No No No Yes Yes - Yes  Type of Advance Directive - - -Public librarianLiving will - Living will Living will;Healthcare Power of Attorney  Does patient want to make changes to medical advance directive? No - Patient declined - - No - Patient declined - - No - Patient declined  Copy of [place] Chart? - - - No - copy requested - - No - copy requested  Would patient like information on creating a medical advance directive? - Yes (MAU/Ambulatory/Procedural Areas - Information given) No - Patient declined - - - -    Tobacco Social History   Tobacco Use  Smoking Status Never Smoker  Smokeless Tobacco Never Used     Counseling given: Not Answered   Clinical Intake:           Diabetes: No  How often do you need to have someone help you when you read instructions, pamphlets, or other written materials from your doctor or pharmacy?: 1 - Never  Interpreter Needed?: No     Past Medical History:  Diagnosis Date   Anemia    iron deficient   Arthritis    [place]   Hep C   B12 deficiency    Barrett esophagus 01/2016   Bradycardia    Diverticulosis    GERD (gastroesophageal reflux disease)    Hypertension    Hypothyroidism    Ulcer disease    Past Surgical  History:  Procedure Laterality Date   CHOLECYSTECTOMY N/A 06/25/2018   Procedure: LAPAROSCOPIC CHOLECYSTECTOMY WITH INTRAOPERATIVE CHOLANGIOGRAM;  Surgeon: BRobert Bellow MD;  Location: [facility] ORS;  Service: General;  Laterality: N/A;   ESOPHAGOGASTRODUODENOSCOPY (EGD) WITH PROPOFOL N/A 01/30/2016   Procedure: ESOPHAGOGASTRODUODENOSCOPY (EGD) WITH PROPOFOL;  Surgeon: MLollie Sails MD;  [facility];  Service: Endoscopy;  Laterality: N/A;   ESOPHAGOGASTRODUODENOSCOPY (EGD) WITH PROPOFOL N/A 01/14/2018   Procedure: ESOPHAGOGASTRODUODENOSCOPY (EGD) WITH PROPOFOL;  Surgeon: SLollie Sails MD;  [facility];  Service: Endoscopy;  Laterality: N/A;   ESOPHAGOGASTRODUODENOSCOPY (EGD) WITH PROPOFOL N/A 05/20/2018   Procedure: ESOPHAGOGASTRODUODENOSCOPY (EGD) WITH PROPOFOL;  Surgeon: SLollie Sails MD;  [facility];  Service: Endoscopy;  Laterality: N/A;   ESOPHAGOGASTRODUODENOSCOPY (EGD) WITH PROPOFOL N/A 01/13/2019   Procedure: ESOPHAGOGASTRODUODENOSCOPY (EGD) WITH PROPOFOL;  Surgeon: SLollie Sails MD;  [facility];  Service: Endoscopy;  Laterality: N/A;   HERNIA REPAIR     lap hiatal hernia repair  07/08/08   LEFT OOPHORECTOMY  1993   benign ovarian cyst   TUBAL LIGATION  1981   Family History  Problem Relation Age of Onset   Stroke Mother    Breast cancer Neg Hx    Colon cancer Neg Hx  Social History   Socioeconomic History   Marital status: Widowed    Spouse name: Not on file   Number of children: 0   Years of education: Not on file   Highest education level: Not on file  Occupational History   Not on file  Tobacco Use   Smoking status: Never Smoker   Smokeless tobacco: Never Used  Vaping Use   Vaping Use: Never used  Substance and Sexual Activity   Alcohol use: No    Alcohol/week: 0.0 standard drinks   Drug use: No   Sexual activity: Never  Other Topics Concern   Not on file  Social History Narrative   Not  on file   Social Determinants of Health   Financial Resource Strain:    Difficulty of Paying Living Expenses:   Food Insecurity:    Worried About Charity fundraiser in the Last Year:    Arboriculturist in the Last Year:   Transportation Needs:    Film/video editor (Medical):    Lack of Transportation (Non-Medical):   Physical Activity:    Days of Exercise per Week:    Minutes of Exercise per Session:   Stress:    Feeling of Stress :   Social Connections:    Frequency of Communication with Friends and Family:    Frequency of Social Gatherings with Friends and Family:    Attends Religious Services:    Active Member of Clubs or Organizations:    Attends Archivist Meetings:    Marital Status:     Outpatient Encounter Medications as of 06/15/2020  Medication Sig   aspirin 81 MG tablet Take 81 mg by mouth daily.   atenolol (TENORMIN) 25 MG tablet TAKE 1/2 TABLET BY MOUTH EVERY DAY   benazepril (LOTENSIN) 20 MG tablet Take 1 tablet (20 mg total) by mouth daily.   calcitonin, salmon, (MIACALCIN/FORTICAL) 200 UNIT/ACT nasal spray USE 1 SPRAY IN ONE NOSTRIL DAILY. ALTERNATE NOSTRILS.   Calcium-Phosphorus-Vitamin D (CITRACAL +D3 PO) Take 1 tablet by mouth daily.   chlorzoxazone (PARAFON) 500 MG tablet TAKE 1 TABLET BY MOUTH AT BEDTIME AS NEEDED   Cholecalciferol (D3-1000 PO) Take 1,000 Units by mouth daily.    Cyanocobalamin (B-12) 1000 MCG/ML KIT Inject 1,000 mcg as directed every 30 (thirty) days.    GLUCOSAMINE-CHONDROITIN PO Take 1 tablet by mouth 2 (two) times daily.    levothyroxine (SYNTHROID) 88 MCG tablet Take 1 tablet (88 mcg total) by mouth daily.   Magnesium Oxide 400 (240 Mg) MG TABS Take 1 tablet (400 mg total) by mouth 2 (two) times daily.   Mesalamine (DELZICOL) 400 MG CPDR Take 400 mg by mouth 2 (two) times daily.    pantoprazole (PROTONIX) 40 MG tablet Take 40 mg by mouth 2 (two) times daily.    Polyethyl Glycol-Propyl Glycol (SYSTANE OP) Take 1 drop by  mouth daily as needed (for dry eyes).   Wheat Dextrin (BENEFIBER DRINK MIX) PACK Take 1 each by mouth daily.    zolpidem (AMBIEN CR) 6.25 MG CR tablet TAKE 1 TABLET BY MOUTH EVERY DAY AT BEDTIME AS NEEDED   No facility-administered encounter medications on file as of 06/15/2020.    Activities of Daily Living In your present state of health, do you have any difficulty performing the following activities: 06/15/2020  Hearing? Y  Vision? N  Difficulty concentrating or making decisions? N  Walking or climbing stairs? N  Dressing or bathing? N  Doing errands, shopping? N  Preparing Food and eating ? N  Using the Toilet? N  In the past six months, have you accidently leaked urine? N  Do you have problems with loss of bowel control? N  Managing your Medications? N  Managing your Finances? N  Housekeeping or managing your Housekeeping? N  Some recent data might be hidden    Patient Care Team: Einar Pheasant, MD as PCP - General (Internal Medicine)    Assessment:   This is a routine wellness examination for Lori [name].  Patient is alert and oriented x3. Patient denies difficulty focusing or concentrating. 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  See completed HM at the end of note.   Eye: Visual acuity not assessed. Virtual visit. Followed by their ophthalmologist.  Dental: Visits every 3 months.    Hearing: Hearing aids- yes  Safety:  Patient feels safe at home- yes Patient does have smoke detectors at home- yes Patient does wear sunscreen or protective clothing when in direct sunlight - yes Patient does wear seat belt when in a moving vehicle - yes Patient drives- yes Adequate lighting in walkways free from debris- yes Grab bars and handrails used as appropriate- yes Ambulates with an assistive device- no  Social: Alcohol intake - no  Smoking history- never   Smokers in home? none Illicit drug use? none  Medication: Taking as  directed and without issues.  Pill box in use -yes  Self managed - yes   Covid-19: Precautions and sickness symptoms discussed. Wears mask, social distancing, hand hygiene as appropriate.   Activities of Daily Living Patient denies needing assistance with: household chores, feeding themselves, getting from bed to chair, getting to the toilet, bathing/showering, dressing, managing money, or preparing meals.   Discussed the importance of a healthy diet, water intake and the benefits of aerobic exercise.   Physical activity- walking   Diet:  Modified diet Water: she tries to drink plenty of water  Other Providers Patient Care Team: Einar Pheasant, MD as PCP - General (Internal Medicine) Exercise Activities and Dietary recommendations Current Exercise Habits: Home exercise routine, Type of exercise: walking, Time (Minutes): 30, Frequency (Times/Week): 4, Weekly Exercise (Minutes/Week): 120, Intensity: Mild  Goals      Healthy Lifestyle     Walk for exercise Stay hydrated Healthy foods        Fall Risk Fall Risk  06/15/2020 06/07/2020 05/22/2019 03/27/2018 03/26/2017  Falls in the past year? 0 0 0 No Yes  Number falls in past yr: 0 - 0 - 1  Injury with Fall? - - 0 - Yes  Comment - - - - Followed by PCP  Follow up Falls evaluation completed Falls evaluation completed Falls evaluation completed - Falls prevention discussed   Is the patient's home free of loose throw rugs in walkways, pet beds, electrical cords, etc?   Yes          Handrails on the stairs?  Yes      Adequate lighting?   Yes  Timed Get Up and Go performed: Stands without the use of chair assistance. Walks 10 feet < than 10 seconds. No assistance required.   Depression Screen PHQ 2/9 Scores 06/15/2020 06/07/2020 05/22/2019 03/27/2018  PHQ - 2 Score 0 0 0 0  PHQ- 9 Score - - 0 -     Cognitive Function MMSE - Mini Mental State Exam 03/26/2017 03/26/2016  Orientation to time 5 5  Orientation to Place 5 5  Registration 3 3  Attention/ Calculation 5 5  Recall 3 3  Language- name 2 objects 2 2  Language- repeat 1 1  Language- follow 3 step command 3 3  Language- read & follow direction 1 1  Write a sentence 1 1  Copy design 1 1  Total score 30 30     6CIT Screen 06/15/2020 06/01/2019 03/27/2018  What Year? 0 points 0 points 0 points  What month? 0 points 0 points 0 points  What time? 0 points 0 points 0 points  Count back from 20 0 points 0 points 0 points  Months in reverse 0 points 0 points 0 points  Repeat phrase 2 points 0 points 0 points  Total Score 2 0 0    Immunization History  Administered Date(s) Administered   Fluad Quad(high Dose 65+) 09/08/2019   Influenza, High Dose Seasonal PF 10/01/2017, 09/12/2018   Influenza,inj,Quad PF,6+ Mos 08/31/2014, 09/07/2015, 08/21/2016   Influenza-Unspecified 09/11/2012, 10/14/2013, 08/31/2014, 09/07/2015, 08/21/2016   PFIZER SARS-COV-2 Vaccination 01/25/2020, 02/15/2020   Pneumococcal Conjugate-13 03/08/2014, 03/02/2015   Pneumococcal Polysaccharide-23 07/22/2008   Zoster 12/31/2008   Zoster Recombinat (Shingrix) 05/14/2018, 07/15/2018   Screening Tests Health Maintenance  Topic Date Due   TETANUS/TDAP  06/15/2021 (Originally 10/01/1953)   MAMMOGRAM  07/15/2020   INFLUENZA VACCINE  07/31/2020   DEXA SCAN  Completed   COVID-19 Vaccine  Completed   PNA vac Low Risk Adult  Completed    Cancer Screenings: Lung: Low Dose CT Chest recommended if Age 59-80 years, 30 pack-year currently smoking OR have quit w/in 15years. Patient does not qualify.     Plan:   Keep all routine maintenance appointments.   Next scheduled lab 06/22/20 non fasting lab  Follow up 07/12/20 @ 10:00 Nurse visit B12 injection  CPE 10/07/20   Medicare Attestation I have personally reviewed: The patient's medical and social history Their use of alcohol, tobacco or illicit drugs Their current medications and supplements The patient's functional ability  including ADLs,fall risks, home safety risks, cognitive, and hearing and visual impairment Diet and physical activities Evidence for depression    have reviewed and discussed with patient certain preventive protocols, quality metrics, and best practice recommendations.      OBrien-Blaney, Tanmay Halteman L, LPN  2/45/8099    I have reviewed the above information and agree with above.   Deborra Medina, MD

## 2020-06-22 ENCOUNTER — Other Ambulatory Visit: Payer: Self-pay

## 2020-06-22 ENCOUNTER — Other Ambulatory Visit (INDEPENDENT_AMBULATORY_CARE_PROVIDER_SITE_OTHER): Payer: Medicare Other

## 2020-06-22 DIAGNOSIS — R944 Abnormal results of kidney function studies: Secondary | ICD-10-CM

## 2020-06-22 LAB — BASIC METABOLIC PANEL
BUN: 24 mg/dL — ABNORMAL HIGH (ref 6–23)
CO2: 26 mEq/L (ref 19–32)
Calcium: 9.6 mg/dL (ref 8.4–10.5)
Chloride: 103 mEq/L (ref 96–112)
Creatinine, Ser: 0.97 mg/dL (ref 0.40–1.20)
GFR: 54.75 mL/min — ABNORMAL LOW (ref >60.00)
Glucose, Bld: 101 mg/dL — ABNORMAL HIGH (ref 70–99)
Potassium: 4.2 mEq/L (ref 3.5–5.1)
Sodium: 139 mEq/L (ref 135–145)

## 2020-06-30 ENCOUNTER — Other Ambulatory Visit: Payer: Self-pay | Admitting: Internal Medicine

## 2020-07-07 ENCOUNTER — Other Ambulatory Visit: Payer: Self-pay | Admitting: Internal Medicine

## 2020-07-08 NOTE — Telephone Encounter (Signed)
rx ok'd for ambien #30 with one refill.   

## 2020-07-12 ENCOUNTER — Ambulatory Visit (INDEPENDENT_AMBULATORY_CARE_PROVIDER_SITE_OTHER): Payer: Medicare Other

## 2020-07-12 ENCOUNTER — Other Ambulatory Visit: Payer: Self-pay

## 2020-07-12 DIAGNOSIS — E538 Deficiency of other specified B group vitamins: Secondary | ICD-10-CM | POA: Diagnosis not present

## 2020-07-12 MED ORDER — CYANOCOBALAMIN 1000 MCG/ML IJ SOLN
1000.0000 ug | Freq: Once | INTRAMUSCULAR | Status: AC
  Administered 2020-07-12: 1000 ug via INTRAMUSCULAR

## 2020-07-12 NOTE — Progress Notes (Signed)
Patient presented for B 12 injection to right deltoid, patient voiced no concerns nor showed any signs of distress during injection. 

## 2020-07-18 ENCOUNTER — Ambulatory Visit
Admission: RE | Admit: 2020-07-18 | Discharge: 2020-07-18 | Disposition: A | Discharge status: Home / Self Care | Payer: Medicare Other | Source: Ambulatory Visit | Attending: Internal Medicine | Admitting: Internal Medicine

## 2020-07-18 DIAGNOSIS — Z1231 Encounter for screening mammogram for malignant neoplasm of breast: Secondary | ICD-10-CM | POA: Insufficient documentation

## 2020-07-25 ENCOUNTER — Other Ambulatory Visit: Payer: Self-pay | Admitting: Internal Medicine

## 2020-07-25 DIAGNOSIS — R252 Cramp and spasm: Secondary | ICD-10-CM

## 2020-07-31 ENCOUNTER — Other Ambulatory Visit: Payer: Self-pay | Admitting: Internal Medicine

## 2020-08-08 ENCOUNTER — Other Ambulatory Visit: Payer: Self-pay | Admitting: Internal Medicine

## 2020-08-15 DIAGNOSIS — Z8249 Family history of ischemic heart disease and other diseases of the circulatory system: Secondary | ICD-10-CM | POA: Diagnosis not present

## 2020-08-15 DIAGNOSIS — I10 Essential (primary) hypertension: Secondary | ICD-10-CM | POA: Diagnosis not present

## 2020-08-16 ENCOUNTER — Other Ambulatory Visit: Payer: Self-pay

## 2020-08-16 ENCOUNTER — Telehealth: Payer: Self-pay | Admitting: Internal Medicine

## 2020-08-16 ENCOUNTER — Ambulatory Visit (INDEPENDENT_AMBULATORY_CARE_PROVIDER_SITE_OTHER): Payer: Medicare Other

## 2020-08-16 DIAGNOSIS — E538 Deficiency of other specified B group vitamins: Secondary | ICD-10-CM | POA: Diagnosis not present

## 2020-08-16 MED ORDER — CYANOCOBALAMIN 1000 MCG/ML IJ SOLN
1000.0000 ug | Freq: Once | INTRAMUSCULAR | Status: AC
  Administered 2020-08-16: 1000 ug via INTRAMUSCULAR

## 2020-08-16 NOTE — Telephone Encounter (Signed)
[facility] called in stated that they faxed form over for authorization for medical equippment for pt for a glucose monitor

## 2020-08-16 NOTE — Progress Notes (Addendum)
Patient presented for B 12 injection to left deltoid, patient voiced no concerns nor showed any signs of distress during injection.  Reviewed.  Dr Scott 

## 2020-08-18 NOTE — Telephone Encounter (Signed)
Called number listed on form from [employer]- was a text now number. This is a scam. Patient has no dx of diabetes.

## 2020-08-19 ENCOUNTER — Other Ambulatory Visit: Payer: Self-pay | Admitting: Internal Medicine

## 2020-08-21 ENCOUNTER — Other Ambulatory Visit: Payer: Self-pay | Admitting: Internal Medicine

## 2020-08-21 DIAGNOSIS — R252 Cramp and spasm: Secondary | ICD-10-CM

## 2020-08-23 DIAGNOSIS — Z8249 Family history of ischemic heart disease and other diseases of the circulatory system: Secondary | ICD-10-CM | POA: Diagnosis not present

## 2020-08-23 DIAGNOSIS — I200 Unstable angina: Secondary | ICD-10-CM | POA: Diagnosis not present

## 2020-08-23 DIAGNOSIS — I10 Essential (primary) hypertension: Secondary | ICD-10-CM | POA: Diagnosis not present

## 2020-08-25 ENCOUNTER — Other Ambulatory Visit: Payer: Self-pay | Admitting: Internal Medicine

## 2020-08-28 ENCOUNTER — Other Ambulatory Visit: Payer: Self-pay | Admitting: Internal Medicine

## 2020-09-12 ENCOUNTER — Other Ambulatory Visit: Payer: Self-pay | Admitting: Internal Medicine

## 2020-09-13 NOTE — Telephone Encounter (Signed)
rx ok'd for ambien #30 with one refill.   

## 2020-09-20 ENCOUNTER — Ambulatory Visit (INDEPENDENT_AMBULATORY_CARE_PROVIDER_SITE_OTHER): Payer: Medicare Other

## 2020-09-20 ENCOUNTER — Other Ambulatory Visit: Payer: Self-pay

## 2020-09-20 DIAGNOSIS — E538 Deficiency of other specified B group vitamins: Secondary | ICD-10-CM

## 2020-09-20 DIAGNOSIS — Z23 Encounter for immunization: Secondary | ICD-10-CM | POA: Diagnosis not present

## 2020-09-20 MED ORDER — CYANOCOBALAMIN 1000 MCG/ML IJ SOLN
1000.0000 ug | Freq: Once | INTRAMUSCULAR | Status: AC
  Administered 2020-09-20: 1000 ug via INTRAMUSCULAR

## 2020-09-20 NOTE — Progress Notes (Addendum)
Patient presented for B 12 injection to right deltoid, patient voiced no concerns nor showed any signs of distress during injection.  Reviewed.  Dr Scott 

## 2020-10-07 ENCOUNTER — Ambulatory Visit (INDEPENDENT_AMBULATORY_CARE_PROVIDER_SITE_OTHER): Payer: Medicare Other | Admitting: Internal Medicine

## 2020-10-07 ENCOUNTER — Other Ambulatory Visit: Payer: Self-pay

## 2020-10-07 VITALS — BP 112/64 | HR 57 | Temp 98.1°F | Resp 16 | Ht 61.0 in | Wt 151.0 lb

## 2020-10-07 DIAGNOSIS — R252 Cramp and spasm: Secondary | ICD-10-CM | POA: Diagnosis not present

## 2020-10-07 DIAGNOSIS — R739 Hyperglycemia, unspecified: Secondary | ICD-10-CM | POA: Diagnosis not present

## 2020-10-07 DIAGNOSIS — K209 Esophagitis, unspecified without bleeding: Secondary | ICD-10-CM | POA: Diagnosis not present

## 2020-10-07 DIAGNOSIS — Z0000 Encounter for general adult medical examination without abnormal findings: Secondary | ICD-10-CM

## 2020-10-07 DIAGNOSIS — E538 Deficiency of other specified B group vitamins: Secondary | ICD-10-CM

## 2020-10-07 DIAGNOSIS — R197 Diarrhea, unspecified: Secondary | ICD-10-CM

## 2020-10-07 DIAGNOSIS — D649 Anemia, unspecified: Secondary | ICD-10-CM

## 2020-10-07 DIAGNOSIS — N1831 Chronic kidney disease, stage 3a: Secondary | ICD-10-CM | POA: Diagnosis not present

## 2020-10-07 DIAGNOSIS — K2270 Barrett's esophagus without dysplasia: Secondary | ICD-10-CM | POA: Diagnosis not present

## 2020-10-07 DIAGNOSIS — K754 Autoimmune hepatitis: Secondary | ICD-10-CM | POA: Diagnosis not present

## 2020-10-07 DIAGNOSIS — I10 Essential (primary) hypertension: Secondary | ICD-10-CM

## 2020-10-07 DIAGNOSIS — E039 Hypothyroidism, unspecified: Secondary | ICD-10-CM

## 2020-10-07 LAB — LIPID PANEL
Cholesterol: 172 mg/dL (ref 0–200)
HDL: 42.7 mg/dL (ref >39.00)
LDL Cholesterol: 103 mg/dL — ABNORMAL HIGH (ref 0–99)
NonHDL: 129.58
Total CHOL/HDL Ratio: 4
Triglycerides: 134 mg/dL (ref 0.0–149.0)
VLDL: 26.8 mg/dL (ref 0.0–40.0)

## 2020-10-07 LAB — BASIC METABOLIC PANEL
BUN: 20 mg/dL (ref 6–23)
CO2: 28 mEq/L (ref 19–32)
Calcium: 9.6 mg/dL (ref 8.4–10.5)
Chloride: 104 mEq/L (ref 96–112)
Creatinine, Ser: 1.03 mg/dL (ref 0.40–1.20)
GFR: 49.87 mL/min — ABNORMAL LOW (ref >60.00)
Glucose, Bld: 93 mg/dL (ref 70–99)
Potassium: 3.9 mEq/L (ref 3.5–5.1)
Sodium: 139 mEq/L (ref 135–145)

## 2020-10-07 LAB — HEPATIC FUNCTION PANEL
ALT: 8 U/L (ref 0–35)
AST: 13 U/L (ref 0–37)
Albumin: 4.1 g/dL (ref 3.5–5.2)
Alkaline Phosphatase: 51 U/L (ref 39–117)
Bilirubin, Direct: 0.3 mg/dL (ref 0.0–0.3)
Total Bilirubin: 1.9 mg/dL — ABNORMAL HIGH (ref 0.2–1.2)
Total Protein: 7.4 g/dL (ref 6.0–8.3)

## 2020-10-07 LAB — TSH: TSH: 1.03 u[IU]/mL (ref 0.35–4.50)

## 2020-10-07 LAB — HEMOGLOBIN A1C: Hgb A1c MFr Bld: 5.6 % (ref 4.6–6.5)

## 2020-10-07 NOTE — Progress Notes (Signed)
Patient ID: Lori [name], female   DOB: [birth year], 86 y.o.   MRN: [id number]   Subjective:    Patient ID: Lori [name], female    DOB: 07/16/1934, 86 y.o.   MRN: [id number]  HPI This visit occurred during the SARS-CoV-2 public health emergency.  Safety protocols were in place, including screening questions prior to the visit, additional usage of staff PPE, and extensive cleaning of exam room while observing appropriate contact time as indicated for disinfecting solutions.  Patient with past history hypertension, Barretts and autoimmune hepatitis.  She comes in today to follow up on these issues as well as for a complete physical exam.  She reports she has been doing relatively well.  Did fall 09/02/20.  Had been outside.  It was wet.  Shoes wet. Slipped and fell.  Hit her head.  Still with a small knot above her right eye.  No other residual pain or problems. No headache.  No dizziness.  Previous wrist - right - swollen.  Resolved.  No pain.  No chest pain or sob.  Has noticed some increased diarrhea.  Worse at times.  No abdominal pain.  No blood.  Blood pressure doing well.  Saw cardiology 04/2020. Stable.  Recommended f/u in 6 months.    Past Medical History:  Diagnosis Date  . Anemia    iron deficient  . Arthritis   . Autoimmune hepatitis ([place]) 05/10/2016   Hep C  . B12 deficiency   . Barrett esophagus 01/2016  . Bradycardia   . Diverticulosis   . GERD (gastroesophageal reflux disease)   . Hypertension   . Hypothyroidism   . Ulcer disease    Past Surgical History:  Procedure Laterality Date  . CHOLECYSTECTOMY N/A 06/25/2018   Procedure: LAPAROSCOPIC CHOLECYSTECTOMY WITH INTRAOPERATIVE CHOLANGIOGRAM;  Surgeon: Robert Bellow, MD;  Location: [facility] ORS;  Service: General;  Laterality: N/A;  . ESOPHAGOGASTRODUODENOSCOPY (EGD) WITH PROPOFOL N/A 01/30/2016   Procedure: ESOPHAGOGASTRODUODENOSCOPY (EGD) WITH PROPOFOL;  Surgeon: Lollie Sails, MD;  [facility];  Service: Endoscopy;  Laterality: N/A;  . ESOPHAGOGASTRODUODENOSCOPY (EGD) WITH PROPOFOL N/A 01/14/2018   Procedure: ESOPHAGOGASTRODUODENOSCOPY (EGD) WITH PROPOFOL;  Surgeon: Lollie Sails, MD;  Location: [facility] END[facility]COPY;  Service: Endoscopy;  Laterality: N/A;  . ESOPHAGOGASTRODUODENOSCOPY (EGD) WITH PROPOFOL N/A 05/20/2018   Procedure: ESOPHAGOGASTRODUODENOSCOPY (EGD) WITH PROPOFOL;  Surgeon: Lollie Sails, MD;  [facility];  Service: Endoscopy;  Laterality: N/A;  . ESOPHAGOGASTRODUODENOSCOPY (EGD) WITH PROPOFOL N/A 01/13/2019   Procedure: ESOPHAGOGASTRODUODENOSCOPY (EGD) WITH PROPOFOL;  Surgeon: Lollie Sails, MD;  [facility];  Service: Endoscopy;  Laterality: N/A;  . HERNIA REPAIR    . lap hiatal hernia repair  07/08/08  . LEFT OOPHORECTOMY  1993   benign ovarian cyst  . TUBAL LIGATION  1981   Family History  Problem Relation Age of Onset  . Stroke Mother   . Breast cancer Neg Hx   . Colon cancer Neg Hx    Social History   Socioeconomic History  . Marital status: Widowed    Spouse name: Not on file  . Number of children: 0  . Years of education: Not on file  . Highest education level: Not on file  Occupational History  . Not on file  Tobacco Use  . Smoking status: Never Smoker  . Smokeless tobacco: Never Used  Vaping Use  . Vaping Use: Never used  Substance and Sexual Activity  . Alcohol use: No    Alcohol/week: 0.0 standard  drinks  . Drug use: No  . Sexual activity: Never  Other Topics Concern  . Not on file  Social History Narrative  . Not on file   Social Determinants of Health   Financial Resource Strain:   . Difficulty of Paying Living Expenses: Not on file  Food Insecurity:   . Worried About Charity fundraiser in the Last Year: Not on file  . Ran Out of Food in the Last Year: Not on file  Transportation Needs:   . Lack of Transportation (Medical): Not on file  . Lack of Transportation (Non-Medical): Not on  file  Physical Activity:   . Days of Exercise per Week: Not on file  . Minutes of Exercise per Session: Not on file  Stress:   . Feeling of Stress : Not on file  Social Connections:   . Frequency of Communication with Friends and Family: Not on file  . Frequency of Social Gatherings with Friends and Family: Not on file  . Attends Religious Services: Not on file  . Active Member of Clubs or Organizations: Not on file  . Attends Archivist Meetings: Not on file  . Marital Status: Not on file    Outpatient Encounter Medications as of 10/07/2020  Medication Sig  . aspirin 81 MG tablet Take 81 mg by mouth daily.  Marland Kitchen atenolol (TENORMIN) 25 MG tablet TAKE 1/2 TABLET BY MOUTH EVERY DAY  . benazepril (LOTENSIN) 20 MG tablet TAKE 1 TABLET BY MOUTH EVERY DAY  . calcitonin, salmon, (MIACALCIN/FORTICAL) 200 UNIT/ACT nasal spray USE 1 SPRAY IN ONE NOSTRIL DAILY. ALTERNATE NOSTRILS.  Marland Kitchen Calcium-Phosphorus-Vitamin D (CITRACAL +D3 PO) Take 1 tablet by mouth daily.  . chlorzoxazone (PARAFON) 500 MG tablet TAKE 1 TABLET BY MOUTH AT BEDTIME AS NEEDED  . Cholecalciferol (D3-1000 PO) Take 1,000 Units by mouth daily.   . Cyanocobalamin (B-12) 1000 MCG/ML KIT Inject 1,000 mcg as directed every 30 (thirty) days.   Marland Kitchen GLUCOSAMINE-CHONDROITIN PO Take 1 tablet by mouth 2 (two) times daily.   Marland Kitchen levothyroxine (SYNTHROID) 88 MCG tablet TAKE 1 TABLET BY MOUTH EVERY DAY  . Magnesium Oxide 400 (240 Mg) MG TABS Take 1 tablet (400 mg total) by mouth 2 (two) times daily.  . Mesalamine (DELZICOL) 400 MG CPDR Take 400 mg by mouth 2 (two) times daily.   . pantoprazole (PROTONIX) 40 MG tablet Take 40 mg by mouth 2 (two) times daily.   Vladimir Faster Glycol-Propyl Glycol (SYSTANE OP) Take 1 drop by mouth daily as needed (for dry eyes).  . Wheat Dextrin (BENEFIBER DRINK MIX) PACK Take 1 each by mouth daily.   Marland Kitchen zolpidem (AMBIEN CR) 6.25 MG CR tablet TAKE 1 TABLET BY MOUTH EVERY DAY AT BEDTIME AS NEEDED   No  facility-administered encounter medications on file as of 10/07/2020.    Review of Systems  Constitutional: Negative for appetite change and unexpected weight change.  HENT: Negative for congestion and sinus pressure.   Eyes: Negative for pain and visual disturbance.  Respiratory: Negative for cough, chest tightness and shortness of breath.   Cardiovascular: Negative for chest pain, palpitations and leg swelling.  Gastrointestinal: Positive for diarrhea. Negative for abdominal pain, constipation, nausea and vomiting.  Genitourinary: Negative for difficulty urinating and dysuria.  Musculoskeletal: Negative for joint swelling and myalgias.  Skin: Negative for color change and rash.  Neurological: Negative for dizziness, light-headedness and headaches.  Hematological: Negative for adenopathy. Does not bruise/bleed easily.  Psychiatric/Behavioral: Negative for agitation and dysphoric mood.  Objective:    Physical Exam Vitals reviewed.  Constitutional:      General: She is not in acute distress.    Appearance: Normal appearance. She is well-developed.  HENT:     Head: Normocephalic and atraumatic.     Comments: Small raised - knot- over right eyebrow.  Non tender.      Right Ear: External ear normal.     Left Ear: External ear normal.  Eyes:     General: No scleral icterus.       Right eye: No discharge.        Left eye: No discharge.     Conjunctiva/sclera: Conjunctivae normal.  Neck:     Thyroid: No thyromegaly.  Cardiovascular:     Rate and Rhythm: Normal rate and regular rhythm.     Pulses: Normal pulses.  Pulmonary:     Effort: No tachypnea, accessory muscle usage or respiratory distress.     Breath sounds: Normal breath sounds. No decreased breath sounds or wheezing.  Chest:     Breasts:        Right: No inverted nipple, mass, nipple discharge or tenderness (no axillary adenopathy).        Left: No inverted nipple, mass, nipple discharge or tenderness (no axilarry  adenopathy).  Abdominal:     General: Bowel sounds are normal.     Palpations: Abdomen is soft.     Tenderness: There is no abdominal tenderness.  Musculoskeletal:        General: No swelling or tenderness.     Cervical back: Neck supple. No tenderness.  Lymphadenopathy:     Cervical: No cervical adenopathy.  Skin:    Findings: No erythema or rash.  Neurological:     Mental Status: She is alert and oriented to person, place, and time.  Psychiatric:        Mood and Affect: Mood normal.        Behavior: Behavior normal.     BP 112/64   Pulse (!) 57   Temp 98.1 F (36.7 C) (Oral)   Resp 16   Ht _0  (1.549 m)   Wt 151 lb (68.5 kg)   SpO2 98%   BMI 28.53 kg/m  Wt Readings from Last 3 Encounters:  10/07/20 151 lb (68.5 kg)  06/15/20 153 lb (69.4 kg)  06/07/20 153 lb (69.4 kg)     Lab Results  Component Value Date   WBC 5.4 06/07/2020   HGB 12.9 06/07/2020   HCT 38.3 06/07/2020   PLT 213.0 06/07/2020   GLUCOSE 93 10/07/2020   CHOL 172 10/07/2020   TRIG 134.0 10/07/2020   HDL 42.70 10/07/2020   LDLCALC 103 (H) 10/07/2020   ALT 8 10/07/2020   AST 13 10/07/2020   NA 139 10/07/2020   K 3.9 10/07/2020   CL 104 10/07/2020   CREATININE 1.03 10/07/2020   BUN 20 10/07/2020   CO2 28 10/07/2020   TSH 1.03 10/07/2020   INR 1.0 06/07/2020   HGBA1C 5.6 10/07/2020    MM 3D SCREEN BREAST BILATERAL  Result Date: 07/18/2020 CLINICAL DATA:  Screening. EXAM: DIGITAL SCREENING BILATERAL MAMMOGRAM WITH TOMO AND CAD COMPARISON:  Previous exam(s). ACR Breast Density Category c: The breast tissue is heterogeneously dense, which may obscure small masses. FINDINGS: There are no findings suspicious for malignancy. Images were processed with CAD. IMPRESSION: No mammographic evidence of malignancy. A result letter of this screening mammogram will be mailed directly to the patient. RECOMMENDATION: Screening mammogram in one year. (  Code:SM-B-01Y) BI-RADS CATEGORY  1: Negative.  Electronically Signed   By: Abelardo Diesel M.D.   On: 07/18/2020 14:15       Assessment & Plan:   Problem List Items Addressed This Visit    Leg cramps    Better.  Continue stretches.  Takes magnesium.  Follow.        Hypothyroidism - Primary    On synthroid.  Follow tsh.       Relevant Orders   TSH (Completed)   Hypertension    Blood pressure has been under good control. Continue tenormin and lotensin.  Follow pressures.  Follow metabolic panel.       Relevant Orders   Lipid panel (Completed)   Basic metabolic panel (Completed)   Hyperglycemia    Low carb diet and exercise.  Follow met b and a1c.       Relevant Orders   Hemoglobin A1c (Completed)   Health care maintenance    Physical today 10/07/20.  Mammogram 07/18/20 - Birads I.  Followed by GI.        Diarrhea    No fever.  No abdominal pain.  Has a history of diarrhea.  Has been better recently.  Intermittent flares.  Follow for triggers.  Continue f/u with GI.       CKD (chronic kidney disease) stage 3, GFR 30-59 ml/min (HCC)    Avoid antiinflammatories.  Stay hydrated.  Follow met b.       Barrett's esophagus with esophagitis    Followed by GI.  Upper symptoms controlled. States occasionally will notice some acid reflux, but not a significant issue for her.  On protonix.  EGD 12/2018.  Recommended f/u in 2 years.        B12 deficiency    Continue B12 injections.       Autoimmune hepatitis ([place])    Being followed by GI.  Follow liver function tests.        Relevant Orders   Hepatic function panel (Completed)   Anemia    Follow cbc.           Einar Pheasant, MD

## 2020-10-09 ENCOUNTER — Encounter: Payer: Self-pay | Admitting: Internal Medicine

## 2020-10-09 ENCOUNTER — Other Ambulatory Visit: Payer: Self-pay | Admitting: Internal Medicine

## 2020-10-09 DIAGNOSIS — R197 Diarrhea, unspecified: Secondary | ICD-10-CM | POA: Insufficient documentation

## 2020-10-09 NOTE — Assessment & Plan Note (Addendum)
Followed by GI.  Upper symptoms controlled. States occasionally will notice some acid reflux, but not a significant issue for her.  On protonix.  EGD 12/2018.  Recommended f/u in 2 years.

## 2020-10-09 NOTE — Assessment & Plan Note (Signed)
On synthroid.  Follow tsh.   

## 2020-10-09 NOTE — Assessment & Plan Note (Signed)
Follow cbc.  

## 2020-10-09 NOTE — Assessment & Plan Note (Signed)
Low carb diet and exercise.  Follow met b and a1c.  

## 2020-10-09 NOTE — Assessment & Plan Note (Signed)
Physical today 10/07/20.  Mammogram 07/18/20 - Birads I.  Followed by GI.

## 2020-10-09 NOTE — Assessment & Plan Note (Signed)
Avoid antiinflammatories.  Stay hydrated.  Follow met b.   

## 2020-10-09 NOTE — Assessment & Plan Note (Signed)
Being followed by GI.  Follow liver function tests.

## 2020-10-09 NOTE — Assessment & Plan Note (Signed)
Blood pressure has been under good control. Continue tenormin and lotensin.  Follow pressures.  Follow metabolic panel.

## 2020-10-09 NOTE — Assessment & Plan Note (Signed)
No fever.  No abdominal pain.  Has a history of diarrhea.  Has been better recently.  Intermittent flares.  Follow for triggers.  Continue f/u with GI.

## 2020-10-09 NOTE — Assessment & Plan Note (Signed)
Continue B12 injections.   

## 2020-10-09 NOTE — Progress Notes (Signed)
Order placed for f/u lab.   

## 2020-10-09 NOTE — Assessment & Plan Note (Signed)
Better.  Continue stretches.  Takes magnesium.  Follow.

## 2020-10-20 ENCOUNTER — Other Ambulatory Visit: Payer: Self-pay | Admitting: Internal Medicine

## 2020-10-25 ENCOUNTER — Other Ambulatory Visit: Payer: Self-pay

## 2020-10-25 ENCOUNTER — Ambulatory Visit (INDEPENDENT_AMBULATORY_CARE_PROVIDER_SITE_OTHER): Payer: Medicare Other

## 2020-10-25 DIAGNOSIS — E538 Deficiency of other specified B group vitamins: Secondary | ICD-10-CM | POA: Diagnosis not present

## 2020-10-25 MED ORDER — CYANOCOBALAMIN 1000 MCG/ML IJ SOLN
1000.0000 ug | Freq: Once | INTRAMUSCULAR | Status: AC
  Administered 2020-10-25: 1000 ug via INTRAMUSCULAR

## 2020-10-25 NOTE — Progress Notes (Addendum)
Patient presented for B 12 injection to left deltoid, patient voiced no concerns nor showed any signs of distress during injection.  Reviewed.  Dr Scott 

## 2020-11-02 ENCOUNTER — Other Ambulatory Visit (INDEPENDENT_AMBULATORY_CARE_PROVIDER_SITE_OTHER): Payer: Medicare Other

## 2020-11-02 ENCOUNTER — Other Ambulatory Visit: Payer: Self-pay

## 2020-11-02 LAB — HEPATIC FUNCTION PANEL
ALT: 12 U/L (ref 0–35)
AST: 16 U/L (ref 0–37)
Albumin: 3.9 g/dL (ref 3.5–5.2)
Alkaline Phosphatase: 55 U/L (ref 39–117)
Bilirubin, Direct: 0.1 mg/dL (ref 0.0–0.3)
Total Bilirubin: 0.5 mg/dL (ref 0.2–1.2)
Total Protein: 7.2 g/dL (ref 6.0–8.3)

## 2020-11-10 DIAGNOSIS — I10 Essential (primary) hypertension: Secondary | ICD-10-CM | POA: Diagnosis not present

## 2020-11-10 DIAGNOSIS — I491 Atrial premature depolarization: Secondary | ICD-10-CM | POA: Diagnosis not present

## 2020-11-10 DIAGNOSIS — R001 Bradycardia, unspecified: Secondary | ICD-10-CM | POA: Diagnosis not present

## 2020-11-14 ENCOUNTER — Other Ambulatory Visit: Payer: Self-pay | Admitting: Internal Medicine

## 2020-11-14 DIAGNOSIS — R252 Cramp and spasm: Secondary | ICD-10-CM

## 2020-11-18 ENCOUNTER — Other Ambulatory Visit: Payer: Self-pay | Admitting: Internal Medicine

## 2020-11-29 ENCOUNTER — Ambulatory Visit (INDEPENDENT_AMBULATORY_CARE_PROVIDER_SITE_OTHER): Payer: Medicare Other

## 2020-11-29 ENCOUNTER — Other Ambulatory Visit: Payer: Self-pay

## 2020-11-29 DIAGNOSIS — E538 Deficiency of other specified B group vitamins: Secondary | ICD-10-CM

## 2020-11-29 MED ORDER — CYANOCOBALAMIN 1000 MCG/ML IJ SOLN
1000.0000 ug | Freq: Once | INTRAMUSCULAR | Status: AC
  Administered 2020-11-29: 1000 ug via INTRAMUSCULAR

## 2020-11-29 NOTE — Progress Notes (Addendum)
Patient presented for B 12 injection to left deltoid, patient voiced no concerns nor showed any signs of distress during injection.  Reviewed.  Dr Scott 

## 2020-11-30 DIAGNOSIS — I491 Atrial premature depolarization: Secondary | ICD-10-CM | POA: Diagnosis not present

## 2020-12-01 DIAGNOSIS — I491 Atrial premature depolarization: Secondary | ICD-10-CM | POA: Diagnosis not present

## 2020-12-01 DIAGNOSIS — R001 Bradycardia, unspecified: Secondary | ICD-10-CM | POA: Diagnosis not present

## 2020-12-01 DIAGNOSIS — I10 Essential (primary) hypertension: Secondary | ICD-10-CM | POA: Diagnosis not present

## 2020-12-09 ENCOUNTER — Other Ambulatory Visit: Payer: Self-pay | Admitting: Internal Medicine

## 2020-12-27 DIAGNOSIS — Z23 Encounter for immunization: Secondary | ICD-10-CM | POA: Diagnosis not present

## 2021-01-02 ENCOUNTER — Ambulatory Visit: Payer: Medicare Other

## 2021-01-04 ENCOUNTER — Ambulatory Visit (INDEPENDENT_AMBULATORY_CARE_PROVIDER_SITE_OTHER): Payer: Medicare Other

## 2021-01-04 ENCOUNTER — Other Ambulatory Visit: Payer: Self-pay

## 2021-01-04 VITALS — Temp 98.3°F

## 2021-01-04 DIAGNOSIS — E538 Deficiency of other specified B group vitamins: Secondary | ICD-10-CM | POA: Diagnosis not present

## 2021-01-04 MED ORDER — CYANOCOBALAMIN 1000 MCG/ML IJ SOLN
1000.0000 ug | Freq: Once | INTRAMUSCULAR | Status: AC
  Administered 2021-01-04: 1000 ug via INTRAMUSCULAR

## 2021-01-04 NOTE — Progress Notes (Signed)
Patient came in today for B-12 injection given in right deltoid IM. Pt showed no signs of distress & tolerated well.

## 2021-01-05 ENCOUNTER — Other Ambulatory Visit: Payer: Self-pay | Admitting: Internal Medicine

## 2021-01-18 ENCOUNTER — Other Ambulatory Visit: Payer: Self-pay | Admitting: Internal Medicine

## 2021-01-18 DIAGNOSIS — R252 Cramp and spasm: Secondary | ICD-10-CM

## 2021-01-18 NOTE — Telephone Encounter (Signed)
rx sent in for ambien #30 with one refill and chlorzoxazone #30 with no refills.

## 2021-01-19 ENCOUNTER — Encounter: Payer: Self-pay | Admitting: Internal Medicine

## 2021-01-19 DIAGNOSIS — R001 Bradycardia, unspecified: Secondary | ICD-10-CM | POA: Diagnosis not present

## 2021-01-19 DIAGNOSIS — R9431 Abnormal electrocardiogram [ECG] [EKG]: Secondary | ICD-10-CM | POA: Diagnosis not present

## 2021-01-19 DIAGNOSIS — I491 Atrial premature depolarization: Secondary | ICD-10-CM | POA: Diagnosis not present

## 2021-01-19 DIAGNOSIS — I10 Essential (primary) hypertension: Secondary | ICD-10-CM | POA: Diagnosis not present

## 2021-01-26 DIAGNOSIS — H2513 Age-related nuclear cataract, bilateral: Secondary | ICD-10-CM | POA: Diagnosis not present

## 2021-02-03 ENCOUNTER — Other Ambulatory Visit: Payer: Self-pay | Admitting: Internal Medicine

## 2021-02-07 ENCOUNTER — Ambulatory Visit (INDEPENDENT_AMBULATORY_CARE_PROVIDER_SITE_OTHER): Payer: Medicare Other | Admitting: Internal Medicine

## 2021-02-07 ENCOUNTER — Other Ambulatory Visit: Payer: Self-pay

## 2021-02-07 VITALS — BP 120/66 | HR 78 | Temp 98.0°F | Resp 16 | Ht 61.0 in | Wt 151.4 lb

## 2021-02-07 DIAGNOSIS — D472 Monoclonal gammopathy: Secondary | ICD-10-CM | POA: Diagnosis not present

## 2021-02-07 DIAGNOSIS — R0789 Other chest pain: Secondary | ICD-10-CM | POA: Diagnosis not present

## 2021-02-07 DIAGNOSIS — R945 Abnormal results of liver function studies: Secondary | ICD-10-CM

## 2021-02-07 DIAGNOSIS — K3184 Gastroparesis: Secondary | ICD-10-CM | POA: Diagnosis not present

## 2021-02-07 DIAGNOSIS — N1831 Chronic kidney disease, stage 3a: Secondary | ICD-10-CM | POA: Diagnosis not present

## 2021-02-07 DIAGNOSIS — R739 Hyperglycemia, unspecified: Secondary | ICD-10-CM | POA: Diagnosis not present

## 2021-02-07 DIAGNOSIS — R7989 Other specified abnormal findings of blood chemistry: Secondary | ICD-10-CM

## 2021-02-07 DIAGNOSIS — E039 Hypothyroidism, unspecified: Secondary | ICD-10-CM

## 2021-02-07 DIAGNOSIS — I10 Essential (primary) hypertension: Secondary | ICD-10-CM

## 2021-02-07 DIAGNOSIS — K754 Autoimmune hepatitis: Secondary | ICD-10-CM

## 2021-02-07 DIAGNOSIS — K2270 Barrett's esophagus without dysplasia: Secondary | ICD-10-CM

## 2021-02-07 DIAGNOSIS — K209 Esophagitis, unspecified without bleeding: Secondary | ICD-10-CM | POA: Diagnosis not present

## 2021-02-07 DIAGNOSIS — D649 Anemia, unspecified: Secondary | ICD-10-CM | POA: Diagnosis not present

## 2021-02-07 LAB — CBC WITH DIFFERENTIAL/PLATELET
Basophils Absolute: 0 10*3/uL (ref 0.0–0.1)
Basophils Relative: 0.8 % (ref 0.0–3.0)
Eosinophils Absolute: 0.1 10*3/uL (ref 0.0–0.7)
Eosinophils Relative: 1.6 % (ref 0.0–5.0)
HCT: 35.7 % — ABNORMAL LOW (ref 36.0–46.0)
Hemoglobin: 12.1 g/dL (ref 12.0–15.0)
Lymphocytes Relative: 12.5 % (ref 12.0–46.0)
Lymphs Abs: 0.8 10*3/uL (ref 0.7–4.0)
MCHC: 33.9 g/dL (ref 30.0–36.0)
MCV: 89 fl (ref 78.0–100.0)
Monocytes Absolute: 0.5 10*3/uL (ref 0.1–1.0)
Monocytes Relative: 7 % (ref 3.0–12.0)
Neutro Abs: 5.1 10*3/uL (ref 1.4–7.7)
Neutrophils Relative %: 78.1 % — ABNORMAL HIGH (ref 43.0–77.0)
Platelets: 207 10*3/uL (ref 150.0–400.0)
RBC: 4.01 Mil/uL (ref 3.87–5.11)
RDW: 13.3 % (ref 11.5–15.5)
WBC: 6.5 10*3/uL (ref 4.0–10.5)

## 2021-02-07 LAB — HEPATIC FUNCTION PANEL
ALT: 10 U/L (ref 0–35)
AST: 15 U/L (ref 0–37)
Albumin: 4.1 g/dL (ref 3.5–5.2)
Alkaline Phosphatase: 48 U/L (ref 39–117)
Bilirubin, Direct: 0.2 mg/dL (ref 0.0–0.3)
Total Bilirubin: 1.2 mg/dL (ref 0.2–1.2)
Total Protein: 7.5 g/dL (ref 6.0–8.3)

## 2021-02-07 LAB — LIPID PANEL
Cholesterol: 152 mg/dL (ref 0–200)
HDL: 47.5 mg/dL (ref >39.00)
LDL Cholesterol: 83 mg/dL (ref 0–99)
NonHDL: 104.79
Total CHOL/HDL Ratio: 3
Triglycerides: 109 mg/dL (ref 0.0–149.0)
VLDL: 21.8 mg/dL (ref 0.0–40.0)

## 2021-02-07 LAB — BASIC METABOLIC PANEL
BUN: 34 mg/dL — ABNORMAL HIGH (ref 6–23)
CO2: 29 mEq/L (ref 19–32)
Calcium: 9.9 mg/dL (ref 8.4–10.5)
Chloride: 102 mEq/L (ref 96–112)
Creatinine, Ser: 1.04 mg/dL (ref 0.40–1.20)
GFR: 49.41 mL/min — ABNORMAL LOW (ref >60.00)
Glucose, Bld: 96 mg/dL (ref 70–99)
Potassium: 3.8 mEq/L (ref 3.5–5.1)
Sodium: 137 mEq/L (ref 135–145)

## 2021-02-07 LAB — HEMOGLOBIN A1C: Hgb A1c MFr Bld: 5.6 % (ref 4.6–6.5)

## 2021-02-07 NOTE — Progress Notes (Signed)
Patient ID: Lori [name], female   DOB: August 27, 1934, 87 y.o.   MRN: [id number]   Subjective:    Patient ID: Lori [name], female    DOB: [birth year], 87 y.o.   MRN: [id number]  HPI This visit occurred during the SARS-CoV-2 public health emergency.  Safety protocols were in place, including screening questions prior to the visit, additional usage of staff PPE, and extensive cleaning of exam room while observing appropriate contact time as indicated for disinfecting solutions.  Patient here for a scheduled follow up.   Here to follow up regarding her blood pressure and cholesterol.  She recently saw cardiology.  Atenolol was stopped secondary to SB.  Her benazepril was doubled.  States since this change, she has noticed occasional fluttering.  Intermittent dizziness.  No significant chest pain or sob with increased activity or exertion.  Did notice some chest tightness (sitting) - question if related to the fluttering.  No increased cough or congestion.  No acid reflux reported.  No abdominal pain.  Bowels moving.    Past Medical History:  Diagnosis Date  . Anemia    iron deficient  . Arthritis   . Autoimmune hepatitis ([place]) 05/10/2016   Hep C  . B12 deficiency   . Barrett esophagus 01/2016  . Bradycardia   . Diverticulosis   . GERD (gastroesophageal reflux disease)   . Hypertension   . Hypothyroidism   . Ulcer disease    Past Surgical History:  Procedure Laterality Date  . CHOLECYSTECTOMY N/A 06/25/2018   Procedure: LAPAROSCOPIC CHOLECYSTECTOMY WITH INTRAOPERATIVE CHOLANGIOGRAM;  Surgeon: Robert Bellow, MD;  Location: [facility] ORS;  Service: General;  Laterality: N/A;  . ESOPHAGOGASTRODUODENOSCOPY (EGD) WITH PROPOFOL N/A 01/30/2016   Procedure: ESOPHAGOGASTRODUODENOSCOPY (EGD) WITH PROPOFOL;  Surgeon: Lollie Sails, MD;  Location: [facility] ENDOSCOPY;  Service: Endoscopy;  Laterality: N/A;  . ESOPHAGOGASTRODUODENOSCOPY (EGD) WITH PROPOFOL N/A 01/14/2018   Procedure:  ESOPHAGOGASTRODUODENOSCOPY (EGD) WITH PROPOFOL;  Surgeon: Lollie Sails, MD;  Location: [facility] ENDOSCOPY;  Service: Endoscopy;  Laterality: N/A;  . ESOPHAGOGASTRODUODENOSCOPY (EGD) WITH PROPOFOL N/A 05/20/2018   Procedure: ESOPHAGOGASTRODUODENOSCOPY (EGD) WITH PROPOFOL;  Surgeon: Lollie Sails, MD;  Location: [facility] ENDOSCOPY;  Service: Endoscopy;  Laterality: N/A;  . ESOPHAGOGASTRODUODENOSCOPY (EGD) WITH PROPOFOL N/A 01/13/2019   Procedure: ESOPHAGOGASTRODUODENOSCOPY (EGD) WITH PROPOFOL;  Surgeon: Lollie Sails, MD;  Location: [facility] ENDOSCOPY;  Service: Endoscopy;  Laterality: N/A;  . HERNIA REPAIR    . lap hiatal hernia repair  07/08/08  . LEFT OOPHORECTOMY  1993   benign ovarian cyst  . TUBAL LIGATION  1981   Family History  Problem Relation Age of Onset  . Stroke Mother   . Breast cancer Neg Hx   . Colon cancer Neg Hx    Social History   Socioeconomic History  . Marital status: Widowed    Spouse name: Not on file  . Number of children: 0  . Years of education: Not on file  . Highest education level: Not on file  Occupational History  . Not on file  Tobacco Use  . Smoking status: Never Smoker  . Smokeless tobacco: Never Used  Vaping Use  . Vaping Use: Never used  Substance and Sexual Activity  . Alcohol use: No    Alcohol/week: 0.0 standard drinks  . Drug use: No  . Sexual activity: Never  Other Topics Concern  . Not on file  Social History Narrative  . Not on file   Social Determinants of Health   Financial Resource  Strain: Not on file  Food Insecurity: Not on file  Transportation Needs: Not on file  Physical Activity: Not on file  Stress: Not on file  Social Connections: Not on file    Outpatient Encounter Medications as of 02/07/2021  Medication Sig  . aspirin 81 MG tablet Take 81 mg by mouth daily.  . benazepril (LOTENSIN) 20 MG tablet TAKE 1 TABLET BY MOUTH EVERY DAY  . calcitonin, salmon, (MIACALCIN/FORTICAL) 200 UNIT/ACT nasal spray USE 1 SPRAY IN  ONE NOSTRIL DAILY. ALTERNATE NOSTRILS.  Marland Kitchen Calcium-Phosphorus-Vitamin D (CITRACAL +D3 PO) Take 1 tablet by mouth daily.  . chlorzoxazone (PARAFON) 500 MG tablet TAKE 1 TABLET BY MOUTH AT BEDTIME AS NEEDED  . Cholecalciferol (D3-1000 PO) Take 1,000 Units by mouth daily.   . Cyanocobalamin (B-12) 1000 MCG/ML KIT Inject 1,000 mcg as directed every 30 (thirty) days.   Marland Kitchen GLUCOSAMINE-CHONDROITIN PO Take 1 tablet by mouth 2 (two) times daily.   Marland Kitchen levothyroxine (SYNTHROID) 88 MCG tablet TAKE 1 TABLET BY MOUTH EVERY DAY  . Magnesium Oxide 400 (240 Mg) MG TABS Take 1 tablet (400 mg total) by mouth 2 (two) times daily.  . Mesalamine (DELZICOL) 400 MG CPDR Take 400 mg by mouth 2 (two) times daily.   . pantoprazole (PROTONIX) 40 MG tablet Take 40 mg by mouth 2 (two) times daily.   . Wheat Dextrin (BENEFIBER DRINK MIX) PACK Take 1 each by mouth daily.   Marland Kitchen zolpidem (AMBIEN CR) 6.25 MG CR tablet TAKE 1 TABLET BY MOUTH EVERY DAY AT BEDTIME AS NEEDED  . [DISCONTINUED] atenolol (TENORMIN) 25 MG tablet TAKE 1/2 TABLET BY MOUTH EVERY DAY  . [DISCONTINUED] Polyethyl Glycol-Propyl Glycol (SYSTANE OP) Take 1 drop by mouth daily as needed (for dry eyes).   No facility-administered encounter medications on file as of 02/07/2021.    Review of Systems  Constitutional: Negative for appetite change and unexpected weight change.  HENT: Negative for congestion, sinus pressure and sore throat.   Respiratory: Negative for cough and shortness of breath.        Chest tightness as outlined.   Cardiovascular: Positive for palpitations. Negative for leg swelling.  Gastrointestinal: Negative for diarrhea, nausea and vomiting.  Genitourinary: Negative for difficulty urinating and dysuria.  Musculoskeletal: Negative for joint swelling and myalgias.  Skin: Negative for color change and rash.  Neurological: Negative for headaches.       Occasional light headedness.    Psychiatric/Behavioral: Negative for agitation and dysphoric  mood.       Objective:    Physical Exam Vitals reviewed.  Constitutional:      General: She is not in acute distress.    Appearance: Normal appearance.  HENT:     Head: Normocephalic and atraumatic.     Right Ear: External ear normal.     Left Ear: External ear normal.     Mouth/Throat:     Mouth: Oropharynx is clear and moist.  Eyes:     General: No scleral icterus.       Right eye: No discharge.        Left eye: No discharge.     Conjunctiva/sclera: Conjunctivae normal.  Neck:     Thyroid: No thyromegaly.  Cardiovascular:     Rate and Rhythm: Normal rate and regular rhythm.  Pulmonary:     Effort: No respiratory distress.     Breath sounds: Normal breath sounds. No wheezing.  Abdominal:     General: Bowel sounds are normal.     Palpations:  Abdomen is soft.     Tenderness: There is no abdominal tenderness.  Musculoskeletal:        General: No swelling, tenderness or edema.     Cervical back: Neck supple. No tenderness.  Lymphadenopathy:     Cervical: No cervical adenopathy.  Skin:    Findings: No erythema or rash.  Neurological:     Mental Status: She is alert.  Psychiatric:        Mood and Affect: Mood normal.        Behavior: Behavior normal.     BP 120/66   Pulse 78   Temp 98 F (36.7 C) (Oral)   Resp 16   Ht '5\' 1"'  (1.549 m)   Wt 151 lb 6.4 oz (68.7 kg)   SpO2 98%   BMI 28.61 kg/m  Wt Readings from Last 3 Encounters:  02/07/21 151 lb 6.4 oz (68.7 kg)  10/07/20 151 lb (68.5 kg)  06/15/20 153 lb (69.4 kg)     Lab Results  Component Value Date   WBC 6.5 02/07/2021   HGB 12.1 02/07/2021   HCT 35.7 (L) 02/07/2021   PLT 207.0 02/07/2021   GLUCOSE 96 02/07/2021   CHOL 152 02/07/2021   TRIG 109.0 02/07/2021   HDL 47.50 02/07/2021   LDLCALC 83 02/07/2021   ALT 10 02/07/2021   AST 15 02/07/2021   NA 137 02/07/2021   K 3.8 02/07/2021   CL 102 02/07/2021   CREATININE 1.04 02/07/2021   BUN 34 (H) 02/07/2021   CO2 29 02/07/2021   TSH 1.03  10/07/2020   INR 1.0 06/07/2020   HGBA1C 5.6 02/07/2021    MM 3D SCREEN BREAST BILATERAL  Result Date: 07/18/2020 CLINICAL DATA:  Screening. EXAM: DIGITAL SCREENING BILATERAL MAMMOGRAM WITH TOMO AND CAD COMPARISON:  Previous exam(s). ACR Breast Density Category c: The breast tissue is heterogeneously dense, which may obscure small masses. FINDINGS: There are no findings suspicious for malignancy. Images were processed with CAD. IMPRESSION: No mammographic evidence of malignancy. A result letter of this screening mammogram will be mailed directly to the patient. RECOMMENDATION: Screening mammogram in one year. (Code:SM-B-01Y) BI-RADS CATEGORY  1: Negative. Electronically Signed   By: Abelardo Diesel M.D.   On: 07/18/2020 14:15       Assessment & Plan:   Problem List Items Addressed This Visit    Abnormal liver function tests   Relevant Orders   Hepatic function panel (Completed)   Anemia    Follow cbc.       Autoimmune hepatitis ([place])    Followed by GI.  Follow liver function tests.        Barrett's esophagus with esophagitis    Upper GI symptoms controlled.  On protonix.  EGD 12/2018.  Recommended f/u in 2 years.  Get above issues sorted through first, then f/u with GI. Currently stable.       Chest tightness    Describes occasional fluttering and light headedness.  Chest tightness - sitting.  No chest pain or increased sob with increased activity or exertion.  EKG - SR with non specific changes noted.  Discussed with cardiology regarding further w/up including event monitor, etc.  Question of episodes of increased heart rate (now that she is off atenolol) contributing to her symptoms.  Cardiology to follow up for further w/up.  Pt comfortable with plan.        Relevant Orders   EKG 12-Lead (Completed)   CKD (chronic kidney disease) stage 3, GFR 30-59 ml/min (HCC)  Avoid antiinflammatories.  Stay hydrated.  Follow metabolic panel.       Gastroparesis    Stable.        Hyperglycemia    Low carb diet and exercise.  Follow met b and a1c.       Relevant Orders   Hemoglobin A1c (Completed)   Hypertension    She is off atenolol.  On benazepril (double dose now).  Blood pressure as outlined.  Follow pressures.  Check metabolic panel.       Relevant Orders   CBC with Differential/Platelet (Completed)   Lipid panel (Completed)   Basic metabolic panel (Completed)   Hypothyroidism - Primary    On thyroid replacement.  Follow tsh.       MGUS (monoclonal gammopathy of unknown significance)    Was worked up by hematology.  Last check - no m spike.  Felt no further w/up warranted.            Einar Pheasant, MD

## 2021-02-09 DIAGNOSIS — R42 Dizziness and giddiness: Secondary | ICD-10-CM | POA: Diagnosis not present

## 2021-02-09 DIAGNOSIS — I10 Essential (primary) hypertension: Secondary | ICD-10-CM | POA: Diagnosis not present

## 2021-02-09 DIAGNOSIS — I491 Atrial premature depolarization: Secondary | ICD-10-CM | POA: Diagnosis not present

## 2021-02-13 ENCOUNTER — Encounter: Payer: Self-pay | Admitting: Internal Medicine

## 2021-02-13 NOTE — Assessment & Plan Note (Signed)
Follow cbc.  

## 2021-02-13 NOTE — Assessment & Plan Note (Signed)
She is off atenolol.  On benazepril (double dose now).  Blood pressure as outlined.  Follow pressures.  Check metabolic panel.

## 2021-02-13 NOTE — Assessment & Plan Note (Signed)
Low carb diet and exercise.  Follow met b and a1c.  

## 2021-02-13 NOTE — Assessment & Plan Note (Signed)
On thyroid replacement.  Follow tsh.  

## 2021-02-13 NOTE — Assessment & Plan Note (Signed)
Upper GI symptoms controlled.  On protonix.  EGD 12/2018.  Recommended f/u in 2 years.  Get above issues sorted through first, then f/u with GI. Currently stable.

## 2021-02-13 NOTE — Assessment & Plan Note (Signed)
Followed by GI.  Follow liver function tests.  

## 2021-02-13 NOTE — Assessment & Plan Note (Signed)
Avoid antiinflammatories.  Stay hydrated.  Follow metabolic panel.   

## 2021-02-13 NOTE — Assessment & Plan Note (Signed)
Was worked up by hematology.  Last check - no m spike.  Felt no further w/up warranted.

## 2021-02-13 NOTE — Assessment & Plan Note (Signed)
Stable

## 2021-02-13 NOTE — Assessment & Plan Note (Signed)
Describes occasional fluttering and light headedness.  Chest tightness - sitting.  No chest pain or increased sob with increased activity or exertion.  EKG - SR with non specific changes noted.  Discussed with cardiology regarding further w/up including event monitor, etc.  Question of episodes of increased heart rate (now that she is off atenolol) contributing to her symptoms.  Cardiology to follow up for further w/up.  Pt comfortable with plan.

## 2021-02-23 ENCOUNTER — Other Ambulatory Visit: Payer: Self-pay | Admitting: Internal Medicine

## 2021-03-01 ENCOUNTER — Other Ambulatory Visit: Payer: Self-pay | Admitting: Internal Medicine

## 2021-03-05 ENCOUNTER — Other Ambulatory Visit: Payer: Self-pay | Admitting: Internal Medicine

## 2021-03-05 DIAGNOSIS — R252 Cramp and spasm: Secondary | ICD-10-CM

## 2021-03-07 ENCOUNTER — Other Ambulatory Visit (HOSPITAL_COMMUNITY): Payer: Self-pay | Admitting: Gastroenterology

## 2021-03-07 DIAGNOSIS — K754 Autoimmune hepatitis: Secondary | ICD-10-CM | POA: Diagnosis not present

## 2021-03-07 DIAGNOSIS — K52831 Collagenous colitis: Secondary | ICD-10-CM | POA: Diagnosis not present

## 2021-03-07 DIAGNOSIS — K2270 Barrett's esophagus without dysplasia: Secondary | ICD-10-CM | POA: Diagnosis not present

## 2021-03-09 ENCOUNTER — Ambulatory Visit: Payer: Medicare Other

## 2021-03-09 DIAGNOSIS — K52831 Collagenous colitis: Secondary | ICD-10-CM | POA: Diagnosis not present

## 2021-03-14 ENCOUNTER — Ambulatory Visit (INDEPENDENT_AMBULATORY_CARE_PROVIDER_SITE_OTHER): Payer: Medicare Other | Admitting: *Deleted

## 2021-03-14 ENCOUNTER — Other Ambulatory Visit: Payer: Self-pay

## 2021-03-14 DIAGNOSIS — E538 Deficiency of other specified B group vitamins: Secondary | ICD-10-CM | POA: Diagnosis not present

## 2021-03-14 MED ORDER — CYANOCOBALAMIN 1000 MCG/ML IJ SOLN
1000.0000 ug | Freq: Once | INTRAMUSCULAR | Status: AC
  Administered 2021-03-14: 1000 ug via INTRAMUSCULAR

## 2021-03-14 NOTE — Progress Notes (Signed)
Patient presented for B 12 injection to left deltoid, patient voiced no concerns nor showed any signs of distress during injection. 

## 2021-03-16 ENCOUNTER — Other Ambulatory Visit: Payer: Self-pay

## 2021-03-16 ENCOUNTER — Ambulatory Visit
Admission: RE | Admit: 2021-03-16 | Discharge: 2021-03-16 | Disposition: A | Discharge status: Home / Self Care | Payer: Medicare Other | Source: Ambulatory Visit | Attending: Gastroenterology | Admitting: Gastroenterology

## 2021-03-16 DIAGNOSIS — K754 Autoimmune hepatitis: Secondary | ICD-10-CM | POA: Insufficient documentation

## 2021-03-16 DIAGNOSIS — K760 Fatty (change of) liver, not elsewhere classified: Secondary | ICD-10-CM | POA: Diagnosis not present

## 2021-03-21 ENCOUNTER — Other Ambulatory Visit: Payer: Self-pay | Admitting: Internal Medicine

## 2021-03-21 DIAGNOSIS — H903 Sensorineural hearing loss, bilateral: Secondary | ICD-10-CM | POA: Diagnosis not present

## 2021-03-21 DIAGNOSIS — H6123 Impacted cerumen, bilateral: Secondary | ICD-10-CM | POA: Diagnosis not present

## 2021-04-06 ENCOUNTER — Other Ambulatory Visit: Payer: Self-pay

## 2021-04-06 ENCOUNTER — Ambulatory Visit (INDEPENDENT_AMBULATORY_CARE_PROVIDER_SITE_OTHER): Payer: Medicare Other | Admitting: Internal Medicine

## 2021-04-06 VITALS — BP 110/66 | HR 74 | Temp 98.4°F | Resp 16 | Ht 61.0 in | Wt 153.0 lb

## 2021-04-06 DIAGNOSIS — K209 Esophagitis, unspecified without bleeding: Secondary | ICD-10-CM

## 2021-04-06 DIAGNOSIS — Z124 Encounter for screening for malignant neoplasm of cervix: Secondary | ICD-10-CM | POA: Diagnosis not present

## 2021-04-06 DIAGNOSIS — Z13220 Encounter for screening for lipoid disorders: Secondary | ICD-10-CM | POA: Diagnosis not present

## 2021-04-06 DIAGNOSIS — N1831 Chronic kidney disease, stage 3a: Secondary | ICD-10-CM | POA: Diagnosis not present

## 2021-04-06 DIAGNOSIS — R945 Abnormal results of liver function studies: Secondary | ICD-10-CM

## 2021-04-06 DIAGNOSIS — D649 Anemia, unspecified: Secondary | ICD-10-CM

## 2021-04-06 DIAGNOSIS — K754 Autoimmune hepatitis: Secondary | ICD-10-CM | POA: Diagnosis not present

## 2021-04-06 DIAGNOSIS — R42 Dizziness and giddiness: Secondary | ICD-10-CM

## 2021-04-06 DIAGNOSIS — I10 Essential (primary) hypertension: Secondary | ICD-10-CM

## 2021-04-06 DIAGNOSIS — R739 Hyperglycemia, unspecified: Secondary | ICD-10-CM

## 2021-04-06 DIAGNOSIS — E039 Hypothyroidism, unspecified: Secondary | ICD-10-CM

## 2021-04-06 DIAGNOSIS — K2270 Barrett's esophagus without dysplasia: Secondary | ICD-10-CM

## 2021-04-06 DIAGNOSIS — R7989 Other specified abnormal findings of blood chemistry: Secondary | ICD-10-CM

## 2021-04-06 MED ORDER — CALCITONIN (SALMON) 200 UNIT/ACT NA SOLN: NASAL | 12 refills | Status: DC

## 2021-04-06 NOTE — Progress Notes (Signed)
Patient ID: Lori [name], female   DOB: September 03, 1934, 88 y.o.   MRN: [id number]   Subjective:    Patient ID: Lori [name], female    DOB: 04/25/1934, 87 y.o.   MRN: [id number]  HPI This visit occurred during the SARS-CoV-2 public health emergency.  Safety protocols were in place, including screening questions prior to the visit, additional usage of staff PPE, and extensive cleaning of exam room while observing appropriate contact time as indicated for disinfecting solutions.  Patient here for a scheduled follow up.  Here to follow up regarding her blood pressure.  Last visit was having dizziness.  Saw cardiology. Previously atenolol had been stopped.  Taking benazepril 52m q day now.   Previous ETT no evidence of ischemia. No changes made last visit with cardiology.  She is trying to stay hydrated.  Dizziness has improved.  Wants to monitor.  No chest pain.  Breathing stable.  Saw GI 03/07/21.  Taking protonix. Upper symptoms controlled.  Bowels stable.  Continues on delzicol.     Past Medical History:  Diagnosis Date  . Anemia    iron deficient  . Arthritis   . Autoimmune hepatitis ([facility] 05/10/2016   Hep C  . B12 deficiency   . Barrett esophagus 01/2016  . Bradycardia   . Diverticulosis   . GERD (gastroesophageal reflux disease)   . Hypertension   . Hypothyroidism   . Ulcer disease    Past Surgical History:  Procedure Laterality Date  . CHOLECYSTECTOMY N/A 06/25/2018   Procedure: LAPAROSCOPIC CHOLECYSTECTOMY WITH INTRAOPERATIVE CHOLANGIOGRAM;  Surgeon: BRobert Bellow MD;  Location: [facility] ORS;  Service: General;  Laterality: N/A;  . ESOPHAGOGASTRODUODENOSCOPY (EGD) WITH PROPOFOL N/A 01/30/2016   Procedure: ESOPHAGOGASTRODUODENOSCOPY (EGD) WITH PROPOFOL;  Surgeon: MLollie Sails MD;  [facility];  Service: Endoscopy;  Laterality: N/A;  . ESOPHAGOGASTRODUODENOSCOPY (EGD) WITH PROPOFOL N/A 01/14/2018   Procedure: ESOPHAGOGASTRODUODENOSCOPY (EGD) WITH  PROPOFOL;  Surgeon: SLollie Sails MD;  [facility];  Service: Endoscopy;  Laterality: N/A;  . ESOPHAGOGASTRODUODENOSCOPY (EGD) WITH PROPOFOL N/A 05/20/2018   Procedure: ESOPHAGOGASTRODUODENOSCOPY (EGD) WITH PROPOFOL;  Surgeon: SLollie Sails MD;  [facility];  Service: Endoscopy;  Laterality: N/A;  . ESOPHAGOGASTRODUODENOSCOPY (EGD) WITH PROPOFOL N/A 01/13/2019   Procedure: ESOPHAGOGASTRODUODENOSCOPY (EGD) WITH PROPOFOL;  Surgeon: SLollie Sails MD;  [facility];  Service: Endoscopy;  Laterality: N/A;  . HERNIA REPAIR    . lap hiatal hernia repair  07/08/08  . LEFT OOPHORECTOMY  1993   benign ovarian cyst  . TUBAL LIGATION  1981   Family History  Problem Relation Age of Onset  . Stroke Mother   . Breast cancer Neg Hx   . Colon cancer Neg Hx    Social History   Socioeconomic History  . Marital status: Widowed    Spouse name: Not on file  . Number of children: 0  . Years of education: Not on file  . Highest education level: Not on file  Occupational History  . Not on file  Tobacco Use  . Smoking status: Never Smoker  . Smokeless tobacco: Never Used  Vaping Use  . Vaping Use: Never used  Substance and Sexual Activity  . Alcohol use: No    Alcohol/week: 0.0 standard drinks  . Drug use: No  . Sexual activity: Never  Other Topics Concern  . Not on file  Social History Narrative  . Not on file   Social Determinants of Health   Financial Resource Strain:  Not on file  Food Insecurity: Not on file  Transportation Needs: Not on file  Physical Activity: Not on file  Stress: Not on file  Social Connections: Not on file    Outpatient Encounter Medications as of 04/06/2021  Medication Sig  . aspirin 81 MG tablet Take 81 mg by mouth daily.  . benazepril (LOTENSIN) 20 MG tablet TAKE 1 TABLET BY MOUTH EVERY DAY (Patient taking differently: Take 40 mg by mouth daily.)  . Calcium-Phosphorus-Vitamin D (CITRACAL +D3 PO) Take 1 tablet  by mouth daily.  . chlorzoxazone (PARAFON) 500 MG tablet TAKE 1 TABLET BY MOUTH EVERY DAY AT BEDTIME AS NEEDED  . Cholecalciferol (D3-1000 PO) Take 1,000 Units by mouth daily.   . Cyanocobalamin (B-12) 1000 MCG/ML KIT Inject 1,000 mcg as directed every 30 (thirty) days.   Marland Kitchen GLUCOSAMINE-CHONDROITIN PO Take 1 tablet by mouth 2 (two) times daily.   Marland Kitchen levothyroxine (SYNTHROID) 88 MCG tablet TAKE 1 TABLET BY MOUTH EVERY DAY  . Magnesium Oxide 400 (240 Mg) MG TABS Take 1 tablet (400 mg total) by mouth 2 (two) times daily.  . Mesalamine (ASACOL) 400 MG CPDR DR capsule Take 400 mg by mouth 2 (two) times daily.   . pantoprazole (PROTONIX) 40 MG tablet Take 40 mg by mouth 2 (two) times daily.   . Wheat Dextrin (BENEFIBER DRINK MIX) PACK Take 1 each by mouth daily.   Marland Kitchen zolpidem (AMBIEN CR) 6.25 MG CR tablet TAKE 1 TABLET BY MOUTH EVERY DAY AT BEDTIME AS NEEDED  . [DISCONTINUED] calcitonin, salmon, (MIACALCIN/FORTICAL) 200 UNIT/ACT nasal spray USE 1 SPRAY IN ONE NOSTRIL DAILY. ALTERNATE NOSTRILS.  . calcitonin, salmon, (MIACALCIN/FORTICAL) 200 UNIT/ACT nasal spray One spray alternating nostrils qod   No facility-administered encounter medications on file as of 04/06/2021.    Review of Systems  Constitutional: Negative for appetite change and unexpected weight change.  HENT: Negative for congestion.   Respiratory: Negative for cough, chest tightness and shortness of breath.   Cardiovascular: Negative for chest pain, palpitations and leg swelling.  Gastrointestinal: Negative for abdominal pain, nausea and vomiting.  Genitourinary: Negative for difficulty urinating and dysuria.  Musculoskeletal: Negative for joint swelling and myalgias.  Skin: Negative for color change and rash.  Neurological: Negative for headaches.       Dizziness better.    Psychiatric/Behavioral: Negative for agitation and dysphoric mood.       Objective:    Physical Exam Vitals reviewed.  Constitutional:      General: She is  not in acute distress.    Appearance: Normal appearance.  HENT:     Head: Normocephalic and atraumatic.     Right Ear: External ear normal.     Left Ear: External ear normal.  Eyes:     General: No scleral icterus.       Right eye: No discharge.        Left eye: No discharge.  Neck:     Thyroid: No thyromegaly.  Cardiovascular:     Rate and Rhythm: Normal rate and regular rhythm.  Pulmonary:     Effort: No respiratory distress.     Breath sounds: Normal breath sounds. No wheezing.  Abdominal:     General: Bowel sounds are normal.     Palpations: Abdomen is soft.     Tenderness: There is no abdominal tenderness.  Musculoskeletal:        General: No swelling or tenderness.     Cervical back: Neck supple. No tenderness.  Lymphadenopathy:  Cervical: No cervical adenopathy.  Skin:    Findings: No erythema or rash.  Neurological:     Mental Status: She is alert.  Psychiatric:        Mood and Affect: Mood normal.        Behavior: Behavior normal.     BP 110/66   Pulse 74   Temp 98.4 F (36.9 C) (Oral)   Resp 16   Ht '5\' 1"'  (1.549 m)   Wt 153 lb (69.4 kg)   SpO2 98%   BMI 28.91 kg/m  Wt Readings from Last 3 Encounters:  04/06/21 153 lb (69.4 kg)  02/07/21 151 lb 6.4 oz (68.7 kg)  10/07/20 151 lb (68.5 kg)     Lab Results  Component Value Date   WBC 6.5 02/07/2021   HGB 12.1 02/07/2021   HCT 35.7 (L) 02/07/2021   PLT 207.0 02/07/2021   GLUCOSE 96 02/07/2021   CHOL 152 02/07/2021   TRIG 109.0 02/07/2021   HDL 47.50 02/07/2021   LDLCALC 83 02/07/2021   ALT 10 02/07/2021   AST 15 02/07/2021   NA 137 02/07/2021   K 3.8 02/07/2021   CL 102 02/07/2021   CREATININE 1.04 02/07/2021   BUN 34 (H) 02/07/2021   CO2 29 02/07/2021   TSH 1.03 10/07/2020   INR 1.0 06/07/2020   HGBA1C 5.6 02/07/2021    US ABDOMEN COMPLETE W/ELASTOGRAPHY  Result Date: 03/16/2021 CLINICAL DATA:  Autoimmune hepatitis EXAM: ULTRASOUND ABDOMEN ULTRASOUND HEPATIC ELASTOGRAPHY  TECHNIQUE: Sonography of the upper abdomen was performed. In addition, ultrasound elastography evaluation of the liver was performed. A region of interest was placed within the right lobe of the liver. Following application of a compressive sonographic pulse, tissue compressibility was assessed. Multiple assessments were performed at the selected site. Median tissue compressibility was determined. Previously, hepatic stiffness was assessed by shear wave velocity. Based on recently published Society of Radiologists in Ultrasound consensus article, reporting is now recommended to be performed in the SI units of pressure (kiloPascals) representing hepatic stiffness/elasticity. The obtained result is compared to the published reference standards. (cACLD = compensated Advanced Chronic Liver Disease) COMPARISON:  Ultrasound abdomen 02/16/2020 FINDINGS: ULTRASOUND ABDOMEN Gallbladder: Surgically absent Common bile duct: Diameter: 5 mm, normal Liver: Heterogeneous parenchymal echogenicity of liver. No discrete hepatic mass or contour nodularity. Portal vein is patent on color Doppler imaging with normal direction of blood flow towards the liver. IVC: Normal appearance Pancreas: Normal appearance Spleen: Normal appearance, 8.9 cm length Right Kidney: Length: 8.7 cm. Mild cortical thinning. Normal cortical echogenicity. Tiny extrarenal pelvis. No mass or hydronephrosis. Left Kidney: Length: 9.9 cm. Mild cortical thinning. Normal cortical echogenicity. No mass or hydronephrosis. Abdominal aorta: Normal caliber Other findings: No free fluid ULTRASOUND HEPATIC ELASTOGRAPHY Device: Siemens Helix VTQ Patient position: Oblique Transducer 5C1 Number of measurements: 10 Hepatic segment:  8 Median kPa: 2.5 IQR: 0.5 IQR/Median kPa ratio: 0.2 Data quality:  Good Diagnostic category:  < or = 5 kPa: high probability of being normal The use of hepatic elastography is applicable to patients with viral hepatitis and non-alcoholic fatty liver  disease. At this time, there is insufficient data for the referenced cut-off values and use in other causes of liver disease, including alcoholic liver disease. Patients, however, may be assessed by elastography and serve as their own reference standard/baseline. In patients with non-alcoholic liver disease, the values suggesting compensated advanced chronic liver disease (cACLD) may be lower, and patients may need additional testing with elasticity results of 7-9 kPa. Please  note that abnormal hepatic elasticity and shear wave velocities may also be identified in clinical settings other than with hepatic fibrosis, such as: acute hepatitis, elevated right heart and central venous pressures including use of beta blockers, veno-occlusive disease (Budd-Chiari), infiltrative processes such as mastocytosis/amyloidosis/infiltrative tumor/lymphoma, extrahepatic cholestasis, with hyperemia in the post-prandial state, and with liver transplantation. Correlation with patient history, laboratory data, and clinical condition recommended. Diagnostic Categories: < or =5 kPa: high probability of being normal < or =9 kPa: in the absence of other known clinical signs, rules out cACLD >9 kPa and ?13 kPa: suggestive of cACLD, but needs further testing >13 kPa: highly suggestive of cACLD > or =17 kPa: highly suggestive of cACLD with an increased probability of clinically significant portal hypertension IMPRESSION: ULTRASOUND ABDOMEN: Post cholecystectomy. Mildly heterogeneous parenchymal echogenicity of the liver without focal mass or nodularity. Otherwise negative abdominal ultrasound. ULTRASOUND HEPATIC ELASTOGRAPHY: Median kPa:  2.5 Diagnostic category:  < or = 5 kPa: high probability of being normal Electronically Signed   By: Lavonia Dana M.D.   On: 03/16/2021 14:18       Assessment & Plan:   Problem List Items Addressed This Visit    Abnormal liver function tests    Followed by GI.  Follow liver function tests.  Have been  stable.       Anemia    Follow cbc.       Relevant Orders   CBC with Differential/Platelet   Autoimmune hepatitis ([place])    Followed by GI.  Follow liver function tests.       Barrett's esophagus with esophagitis    Upper symptoms controlled.  Continues protonix.  EGD 12/2018.  Recommended f/u EGD in 2 years.  Just saw GI. See note.       CKD (chronic kidney disease) stage 3, GFR 30-59 ml/min (HCC)    Avoid antiinflammatories.  Stay hydrated.  Follow metabolic panel.       Dizziness    Previous dizziness as outlined. Off atenolol.  On benazepril.  Saw cardiology.  Symptoms have improved.  Discussed further w/up.  Wants to monitor.  Follow.       Hyperglycemia    Low carb diet and exercise.  Follow met b and a1c.       Relevant Orders   Hemoglobin A1c   Hepatic function panel   Hypertension    On benazepril 32m q day.  Off atenolol.  Blood pressure doing well.  Continue pressures.  Follow metabolic panel.       Relevant Orders   Basic metabolic panel   Hypothyroidism    On thyroid replacement.  Follow tsh.        Other Visit Diagnoses    Cervical cancer screening    -  Primary   Relevant Orders   Cytology - PAP( [place])   Screening cholesterol level       Relevant Orders   Lipid panel       CEinar Pheasant MD

## 2021-04-10 ENCOUNTER — Encounter: Payer: Self-pay | Admitting: Internal Medicine

## 2021-04-10 DIAGNOSIS — R42 Dizziness and giddiness: Secondary | ICD-10-CM | POA: Insufficient documentation

## 2021-04-10 NOTE — Assessment & Plan Note (Signed)
Avoid antiinflammatories.  Stay hydrated.  Follow metabolic panel.   

## 2021-04-10 NOTE — Assessment & Plan Note (Signed)
On benazepril 40mg  q day.  Off atenolol.  Blood pressure doing well.  Continue pressures.  Follow metabolic panel.

## 2021-04-10 NOTE — Assessment & Plan Note (Signed)
Follow cbc.  

## 2021-04-10 NOTE — Assessment & Plan Note (Signed)
Followed by GI.  Follow liver function tests.  Have been stable.

## 2021-04-10 NOTE — Assessment & Plan Note (Signed)
Followed by GI.  Follow liver function tests.  

## 2021-04-10 NOTE — Assessment & Plan Note (Signed)
On thyroid replacement.  Follow tsh.  

## 2021-04-10 NOTE — Assessment & Plan Note (Signed)
Upper symptoms controlled.  Continues protonix.  EGD 12/2018.  Recommended f/u EGD in 2 years.  Just saw GI. See note.

## 2021-04-10 NOTE — Assessment & Plan Note (Signed)
Low carb diet and exercise.  Follow met b and a1c.  

## 2021-04-10 NOTE — Assessment & Plan Note (Signed)
Previous dizziness as outlined. Off atenolol.  On benazepril.  Saw cardiology.  Symptoms have improved.  Discussed further w/up.  Wants to monitor.  Follow.

## 2021-04-14 ENCOUNTER — Ambulatory Visit: Payer: Medicare Other

## 2021-04-17 ENCOUNTER — Ambulatory Visit (INDEPENDENT_AMBULATORY_CARE_PROVIDER_SITE_OTHER): Payer: Medicare Other

## 2021-04-17 ENCOUNTER — Other Ambulatory Visit: Payer: Self-pay

## 2021-04-17 DIAGNOSIS — E538 Deficiency of other specified B group vitamins: Secondary | ICD-10-CM

## 2021-04-17 MED ORDER — CYANOCOBALAMIN 1000 MCG/ML IJ SOLN
1000.0000 ug | Freq: Once | INTRAMUSCULAR | Status: AC
  Administered 2021-04-17: 1000 ug via INTRAMUSCULAR

## 2021-04-17 NOTE — Progress Notes (Signed)
Patient presented for B 12 injection to left deltoid, patient voiced no concerns nor showed any signs of distress during injection. 

## 2021-05-11 DIAGNOSIS — D472 Monoclonal gammopathy: Secondary | ICD-10-CM | POA: Diagnosis not present

## 2021-05-11 DIAGNOSIS — I491 Atrial premature depolarization: Secondary | ICD-10-CM | POA: Diagnosis not present

## 2021-05-11 DIAGNOSIS — R001 Bradycardia, unspecified: Secondary | ICD-10-CM | POA: Diagnosis not present

## 2021-05-11 DIAGNOSIS — I10 Essential (primary) hypertension: Secondary | ICD-10-CM | POA: Diagnosis not present

## 2021-05-18 ENCOUNTER — Other Ambulatory Visit: Payer: Self-pay

## 2021-05-18 ENCOUNTER — Ambulatory Visit (INDEPENDENT_AMBULATORY_CARE_PROVIDER_SITE_OTHER): Payer: Medicare Other | Admitting: *Deleted

## 2021-05-18 DIAGNOSIS — E538 Deficiency of other specified B group vitamins: Secondary | ICD-10-CM | POA: Diagnosis not present

## 2021-05-18 MED ORDER — CYANOCOBALAMIN 1000 MCG/ML IJ SOLN
1000.0000 ug | Freq: Once | INTRAMUSCULAR | Status: AC
  Administered 2021-05-18: 1000 ug via INTRAMUSCULAR

## 2021-05-18 NOTE — Progress Notes (Signed)
Patient presented for B 12 injection to right deltoid, patient voiced no concerns nor showed any signs of distress during injection. 

## 2021-05-23 ENCOUNTER — Other Ambulatory Visit: Payer: Self-pay | Admitting: Internal Medicine

## 2021-05-23 NOTE — Telephone Encounter (Signed)
rx ok'd for ambien #30  With one refill.

## 2021-05-23 NOTE — Telephone Encounter (Signed)
RX Refill:ambien Last Seen:04-06-21 Last ordered:03-22-21

## 2021-06-07 ENCOUNTER — Other Ambulatory Visit (INDEPENDENT_AMBULATORY_CARE_PROVIDER_SITE_OTHER): Payer: Medicare Other

## 2021-06-07 ENCOUNTER — Other Ambulatory Visit: Payer: Self-pay

## 2021-06-07 DIAGNOSIS — I10 Essential (primary) hypertension: Secondary | ICD-10-CM | POA: Diagnosis not present

## 2021-06-07 DIAGNOSIS — D649 Anemia, unspecified: Secondary | ICD-10-CM | POA: Diagnosis not present

## 2021-06-07 DIAGNOSIS — Z13220 Encounter for screening for lipoid disorders: Secondary | ICD-10-CM | POA: Diagnosis not present

## 2021-06-07 DIAGNOSIS — R739 Hyperglycemia, unspecified: Secondary | ICD-10-CM | POA: Diagnosis not present

## 2021-06-07 LAB — CBC WITH DIFFERENTIAL/PLATELET
Basophils Absolute: 0.1 10*3/uL (ref 0.0–0.1)
Basophils Relative: 1.2 % (ref 0.0–3.0)
Eosinophils Absolute: 0.1 10*3/uL (ref 0.0–0.7)
Eosinophils Relative: 1.6 % (ref 0.0–5.0)
HCT: 37.3 % (ref 36.0–46.0)
Hemoglobin: 12.6 g/dL (ref 12.0–15.0)
Lymphocytes Relative: 15.7 % (ref 12.0–46.0)
Lymphs Abs: 0.8 10*3/uL (ref 0.7–4.0)
MCHC: 33.8 g/dL (ref 30.0–36.0)
MCV: 89 fl (ref 78.0–100.0)
Monocytes Absolute: 0.3 10*3/uL (ref 0.1–1.0)
Monocytes Relative: 6.3 % (ref 3.0–12.0)
Neutro Abs: 4 10*3/uL (ref 1.4–7.7)
Neutrophils Relative %: 75.2 % (ref 43.0–77.0)
Platelets: 224 10*3/uL (ref 150.0–400.0)
RBC: 4.19 Mil/uL (ref 3.87–5.11)
RDW: 13.5 % (ref 11.5–15.5)
WBC: 5.3 10*3/uL (ref 4.0–10.5)

## 2021-06-07 LAB — BASIC METABOLIC PANEL
BUN: 19 mg/dL (ref 6–23)
CO2: 26 mEq/L (ref 19–32)
Calcium: 9.5 mg/dL (ref 8.4–10.5)
Chloride: 105 mEq/L (ref 96–112)
Creatinine, Ser: 1.11 mg/dL (ref 0.40–1.20)
GFR: 45.59 mL/min — ABNORMAL LOW (ref >60.00)
Glucose, Bld: 93 mg/dL (ref 70–99)
Potassium: 3.9 mEq/L (ref 3.5–5.1)
Sodium: 139 mEq/L (ref 135–145)

## 2021-06-07 LAB — LIPID PANEL
Cholesterol: 163 mg/dL (ref 0–200)
HDL: 46.8 mg/dL (ref >39.00)
LDL Cholesterol: 89 mg/dL (ref 0–99)
NonHDL: 116.09
Total CHOL/HDL Ratio: 3
Triglycerides: 137 mg/dL (ref 0.0–149.0)
VLDL: 27.4 mg/dL (ref 0.0–40.0)

## 2021-06-07 LAB — HEPATIC FUNCTION PANEL
ALT: 10 U/L (ref 0–35)
AST: 15 U/L (ref 0–37)
Albumin: 4.2 g/dL (ref 3.5–5.2)
Alkaline Phosphatase: 51 U/L (ref 39–117)
Bilirubin, Direct: 0.1 mg/dL (ref 0.0–0.3)
Total Bilirubin: 0.9 mg/dL (ref 0.2–1.2)
Total Protein: 7.2 g/dL (ref 6.0–8.3)

## 2021-06-07 LAB — HEMOGLOBIN A1C: Hgb A1c MFr Bld: 5.9 % (ref 4.6–6.5)

## 2021-06-09 ENCOUNTER — Ambulatory Visit: Payer: Medicare Other | Admitting: Internal Medicine

## 2021-06-09 ENCOUNTER — Telehealth: Payer: Self-pay

## 2021-06-09 NOTE — Telephone Encounter (Signed)
Called and left a message to call back for labs.

## 2021-06-12 ENCOUNTER — Other Ambulatory Visit: Payer: Self-pay

## 2021-06-12 ENCOUNTER — Other Ambulatory Visit: Payer: Self-pay | Admitting: Internal Medicine

## 2021-06-12 ENCOUNTER — Ambulatory Visit (INDEPENDENT_AMBULATORY_CARE_PROVIDER_SITE_OTHER): Payer: Medicare Other | Admitting: Internal Medicine

## 2021-06-12 VITALS — BP 126/74 | HR 76 | Temp 97.9°F | Resp 16 | Ht 61.0 in | Wt 152.8 lb

## 2021-06-12 DIAGNOSIS — Z1231 Encounter for screening mammogram for malignant neoplasm of breast: Secondary | ICD-10-CM | POA: Diagnosis not present

## 2021-06-12 DIAGNOSIS — E538 Deficiency of other specified B group vitamins: Secondary | ICD-10-CM | POA: Diagnosis not present

## 2021-06-12 DIAGNOSIS — E039 Hypothyroidism, unspecified: Secondary | ICD-10-CM

## 2021-06-12 DIAGNOSIS — R739 Hyperglycemia, unspecified: Secondary | ICD-10-CM

## 2021-06-12 DIAGNOSIS — K754 Autoimmune hepatitis: Secondary | ICD-10-CM

## 2021-06-12 DIAGNOSIS — K209 Esophagitis, unspecified without bleeding: Secondary | ICD-10-CM

## 2021-06-12 DIAGNOSIS — N1831 Chronic kidney disease, stage 3a: Secondary | ICD-10-CM

## 2021-06-12 DIAGNOSIS — K2270 Barrett's esophagus without dysplasia: Secondary | ICD-10-CM

## 2021-06-12 DIAGNOSIS — I10 Essential (primary) hypertension: Secondary | ICD-10-CM

## 2021-06-12 DIAGNOSIS — R200 Anesthesia of skin: Secondary | ICD-10-CM | POA: Diagnosis not present

## 2021-06-12 DIAGNOSIS — D649 Anemia, unspecified: Secondary | ICD-10-CM | POA: Diagnosis not present

## 2021-06-12 DIAGNOSIS — E2839 Other primary ovarian failure: Secondary | ICD-10-CM

## 2021-06-12 DIAGNOSIS — N189 Chronic kidney disease, unspecified: Secondary | ICD-10-CM | POA: Insufficient documentation

## 2021-06-12 DIAGNOSIS — R252 Cramp and spasm: Secondary | ICD-10-CM

## 2021-06-12 LAB — BASIC METABOLIC PANEL
BUN: 24 mg/dL — ABNORMAL HIGH (ref 6–23)
CO2: 26 mEq/L (ref 19–32)
Calcium: 9.5 mg/dL (ref 8.4–10.5)
Chloride: 106 mEq/L (ref 96–112)
Creatinine, Ser: 1.03 mg/dL (ref 0.40–1.20)
GFR: 49.86 mL/min — ABNORMAL LOW (ref >60.00)
Glucose, Bld: 98 mg/dL (ref 70–99)
Potassium: 4.2 mEq/L (ref 3.5–5.1)
Sodium: 140 mEq/L (ref 135–145)

## 2021-06-12 MED ORDER — BENAZEPRIL HCL 40 MG PO TABS: 40.0000 mg | ORAL_TABLET | Freq: Every day | ORAL | 1 refills | Status: DC

## 2021-06-12 MED ORDER — CHLORZOXAZONE 500 MG PO TABS: ORAL_TABLET | ORAL | 0 refills | Status: DC

## 2021-06-12 NOTE — Telephone Encounter (Signed)
Rx ok'd for parafon.

## 2021-06-12 NOTE — Progress Notes (Signed)
Patient ID: Lori [name], female   DOB: [birth year], 87 y.o.   MRN: [id number]   Subjective:    Patient ID: Lori [name], female    DOB: 02-07-1934, 87 y.o.   MRN: [id number]  HPI This visit occurred during the SARS-CoV-2 public health emergency.  Safety protocols were in place, including screening questions prior to the visit, additional usage of staff PPE, and extensive cleaning of exam room while observing appropriate contact time as indicated for disinfecting solutions.   Patient here for a scheduled follow up.  Here to follow up regarding her blood pressure and cholesterol.  Saw cardiology.  Off atenolol.  On benazepril 60m q day.  The dizzy episodes are better.  No chest pain or sob.  No acid reflux reported.  No abdominal pain.  Bowels stable.  May have a flare 1x/week, but watches her diet and this controls symptoms well.  Blood pressure doing well.  Does reports some tingling/numbness - left foot - occasionally right.  Also increased cramping in her hand with use.     Past Medical History:  Diagnosis Date   Anemia    iron deficient   Arthritis    Autoimmune hepatitis ([place] 05/10/2016   Hep C   B12 deficiency    Barrett esophagus 01/2016   Bradycardia    Diverticulosis    GERD (gastroesophageal reflux disease)    Hypertension    Hypothyroidism    Ulcer disease    Past Surgical History:  Procedure Laterality Date   CHOLECYSTECTOMY N/A 06/25/2018   Procedure: LAPAROSCOPIC CHOLECYSTECTOMY WITH INTRAOPERATIVE CHOLANGIOGRAM;  Surgeon: BRobert Bellow MD;  Location: [facility];  Service: General;  Laterality: N/A;   ESOPHAGOGASTRODUODENOSCOPY (EGD) WITH PROPOFOL N/A 01/30/2016   Procedure: ESOPHAGOGASTRODUODENOSCOPY (EGD) WITH PROPOFOL;  Surgeon: MLollie Sails MD;  [facility];  Service: Endoscopy;  Laterality: N/A;   ESOPHAGOGASTRODUODENOSCOPY (EGD) WITH PROPOFOL N/A 01/14/2018   Procedure: ESOPHAGOGASTRODUODENOSCOPY (EGD) WITH PROPOFOL;  Surgeon:  SLollie Sails MD;  [facility];  Service: Endoscopy;  Laterality: N/A;   ESOPHAGOGASTRODUODENOSCOPY (EGD) WITH PROPOFOL N/A 05/20/2018   Procedure: ESOPHAGOGASTRODUODENOSCOPY (EGD) WITH PROPOFOL;  Surgeon: SLollie Sails MD;  [facility];  Service: Endoscopy;  Laterality: N/A;   ESOPHAGOGASTRODUODENOSCOPY (EGD) WITH PROPOFOL N/A 01/13/2019   Procedure: ESOPHAGOGASTRODUODENOSCOPY (EGD) WITH PROPOFOL;  Surgeon: SLollie Sails MD;  [facility];  Service: Endoscopy;  Laterality: N/A;   HERNIA REPAIR     lap hiatal hernia repair  07/08/08   LEFT OOPHORECTOMY  1993   benign ovarian cyst   TUBAL LIGATION  1981   Family History  Problem Relation Age of Onset   Stroke Mother    Breast cancer Neg Hx    Colon cancer Neg Hx    Social History   Socioeconomic History   Marital status: Widowed    Spouse name: Not on file   Number of children: 0   Years of education: Not on file   Highest education level: Not on file  Occupational History   Not on file  Tobacco Use   Smoking status: Never   Smokeless tobacco: Never  Vaping Use   Vaping Use: Never used  Substance and Sexual Activity   Alcohol use: No    Alcohol/week: 0.0 standard drinks   Drug use: No   Sexual activity: Never  Other Topics Concern   Not on file  Social History Narrative   Not on file   Social Determinants of Health   Financial  Resource Strain: Low Risk    Difficulty of Paying Living Expenses: Not hard at all  Food Insecurity: No Food Insecurity   Worried About Charity fundraiser in the Last Year: Never true   Ran Out of Food in the Last Year: Never true  Transportation Needs: No Transportation Needs   Lack of Transportation (Medical): No   Lack of Transportation (Non-Medical): No  Physical Activity: Insufficiently Active   Days of Exercise per Week: 3 days   Minutes of Exercise per Session: 20 min  Stress: No Stress Concern Present   Feeling of Stress : Not at  all  Social Connections: Unknown   Frequency of Communication with Friends and Family: More than three times a week   Frequency of Social Gatherings with Friends and Family: More than three times a week   Attends Religious Services: Not on file   Active Member of Clubs or Organizations: Not on file   Attends Archivist Meetings: Not on file   Marital Status: Not on file    Outpatient Encounter Medications as of 06/12/2021  Medication Sig   aspirin 81 MG tablet Take 81 mg by mouth daily.   calcitonin, salmon, (MIACALCIN/FORTICAL) 200 UNIT/ACT nasal spray One spray alternating nostrils qod   Calcium-Phosphorus-Vitamin D (CITRACAL +D3 PO) Take 1 tablet by mouth daily.   Cholecalciferol (D3-1000 PO) Take 1,000 Units by mouth daily.    Cyanocobalamin (B-12) 1000 MCG/ML KIT Inject 1,000 mcg as directed every 30 (thirty) days.    GLUCOSAMINE-CHONDROITIN PO Take 1 tablet by mouth 2 (two) times daily.    levothyroxine (SYNTHROID) 88 MCG tablet TAKE 1 TABLET BY MOUTH EVERY DAY   Magnesium Oxide 400 (240 Mg) MG TABS Take 1 tablet (400 mg total) by mouth 2 (two) times daily.   Mesalamine (ASACOL) 400 MG CPDR DR capsule Take 400 mg by mouth 2 (two) times daily.    pantoprazole (PROTONIX) 40 MG tablet Take 40 mg by mouth 2 (two) times daily.    Wheat Dextrin (BENEFIBER DRINK MIX) PACK Take 1 each by mouth daily.    zolpidem (AMBIEN CR) 6.25 MG CR tablet TAKE 1 TABLET BY MOUTH EVERY DAY AT BEDTIME AS NEEDED   [DISCONTINUED] benazepril (LOTENSIN) 40 MG tablet Take 40 mg by mouth daily.   [DISCONTINUED] chlorzoxazone (PARAFON) 500 MG tablet TAKE 1 TABLET BY MOUTH EVERY DAY AT BEDTIME AS NEEDED   benazepril (LOTENSIN) 40 MG tablet Take 1 tablet (40 mg total) by mouth daily.   [DISCONTINUED] benazepril (LOTENSIN) 20 MG tablet TAKE 1 TABLET BY MOUTH EVERY DAY (Patient taking differently: Take 40 mg by mouth daily.)   No facility-administered encounter medications on file as of 06/12/2021.      Review of Systems  Constitutional:  Negative for appetite change and unexpected weight change.  HENT:  Negative for congestion and sinus pressure.   Respiratory:  Negative for cough, chest tightness and shortness of breath.   Cardiovascular:  Negative for chest pain and palpitations.  Gastrointestinal:  Negative for abdominal pain, nausea and vomiting.       Intermittent flares with her bowels  Genitourinary:  Negative for difficulty urinating and dysuria.  Musculoskeletal:  Negative for joint swelling and myalgias.  Skin:  Negative for color change and rash.  Neurological:  Negative for dizziness, light-headedness and headaches.  Psychiatric/Behavioral:  Negative for agitation and dysphoric mood.       Objective:    Physical Exam Vitals reviewed.  Constitutional:  General: She is not in acute distress.    Appearance: Normal appearance.  HENT:     Head: Normocephalic and atraumatic.     Right Ear: External ear normal.     Left Ear: External ear normal.  Eyes:     General: No scleral icterus.       Right eye: No discharge.        Left eye: No discharge.     Conjunctiva/sclera: Conjunctivae normal.  Neck:     Thyroid: No thyromegaly.  Cardiovascular:     Rate and Rhythm: Normal rate and regular rhythm.  Pulmonary:     Effort: No respiratory distress.     Breath sounds: Normal breath sounds. No wheezing.  Abdominal:     General: Bowel sounds are normal.     Palpations: Abdomen is soft.     Tenderness: There is no abdominal tenderness.  Musculoskeletal:        General: No swelling or tenderness.     Cervical back: Neck supple. No tenderness.  Lymphadenopathy:     Cervical: No cervical adenopathy.  Skin:    Findings: No erythema or rash.  Neurological:     Mental Status: She is alert.  Psychiatric:        Mood and Affect: Mood normal.        Behavior: Behavior normal.    BP 126/74   Pulse 76   Temp 97.9 F (36.6 C)   Resp 16   Ht '5\' 1"'  (1.549 m)   Wt  152 lb 12.8 oz (69.3 kg)   SpO2 98%   BMI 28.87 kg/m  Wt Readings from Last 3 Encounters:  06/16/21 152 lb 3.2 oz (69 kg)  06/12/21 152 lb 12.8 oz (69.3 kg)  04/06/21 153 lb (69.4 kg)     Lab Results  Component Value Date   WBC 5.3 06/07/2021   HGB 12.6 06/07/2021   HCT 37.3 06/07/2021   PLT 224.0 06/07/2021   GLUCOSE 98 06/12/2021   CHOL 163 06/07/2021   TRIG 137.0 06/07/2021   HDL 46.80 06/07/2021   LDLCALC 89 06/07/2021   ALT 10 06/07/2021   AST 15 06/07/2021   NA 140 06/12/2021   K 4.2 06/12/2021   CL 106 06/12/2021   CREATININE 1.03 06/12/2021   BUN 24 (H) 06/12/2021   CO2 26 06/12/2021   TSH 1.03 10/07/2020   INR 1.0 06/07/2020   HGBA1C 5.9 06/07/2021    US ABDOMEN COMPLETE W/ELASTOGRAPHY  Result Date: 03/16/2021 CLINICAL DATA:  Autoimmune hepatitis EXAM: ULTRASOUND ABDOMEN ULTRASOUND HEPATIC ELASTOGRAPHY TECHNIQUE: Sonography of the upper abdomen was performed. In addition, ultrasound elastography evaluation of the liver was performed. A region of interest was placed within the right lobe of the liver. Following application of a compressive sonographic pulse, tissue compressibility was assessed. Multiple assessments were performed at the selected site. Median tissue compressibility was determined. Previously, hepatic stiffness was assessed by shear wave velocity. Based on recently published Society of Radiologists in Ultrasound consensus article, reporting is now recommended to be performed in the SI units of pressure (kiloPascals) representing hepatic stiffness/elasticity. The obtained result is compared to the published reference standards. (cACLD = compensated Advanced Chronic Liver Disease) COMPARISON:  Ultrasound abdomen 02/16/2020 FINDINGS: ULTRASOUND ABDOMEN Gallbladder: Surgically absent Common bile duct: Diameter: 5 mm, normal Liver: Heterogeneous parenchymal echogenicity of liver. No discrete hepatic mass or contour nodularity. Portal vein is patent on color  Doppler imaging with normal direction of blood flow towards the liver. IVC: Normal appearance  Pancreas: Normal appearance Spleen: Normal appearance, 8.9 cm length Right Kidney: Length: 8.7 cm. Mild cortical thinning. Normal cortical echogenicity. Tiny extrarenal pelvis. No mass or hydronephrosis. Left Kidney: Length: 9.9 cm. Mild cortical thinning. Normal cortical echogenicity. No mass or hydronephrosis. Abdominal aorta: Normal caliber Other findings: No free fluid ULTRASOUND HEPATIC ELASTOGRAPHY Device: Siemens Helix VTQ Patient position: Oblique Transducer 5C1 Number of measurements: 10 Hepatic segment:  8 Median kPa: 2.5 IQR: 0.5 IQR/Median kPa ratio: 0.2 Data quality:  Good Diagnostic category:  < or = 5 kPa: high probability of being normal The use of hepatic elastography is applicable to patients with viral hepatitis and non-alcoholic fatty liver disease. At this time, there is insufficient data for the referenced cut-off values and use in other causes of liver disease, including alcoholic liver disease. Patients, however, may be assessed by elastography and serve as their own reference standard/baseline. In patients with non-alcoholic liver disease, the values suggesting compensated advanced chronic liver disease (cACLD) may be lower, and patients may need additional testing with elasticity results of 7-9 kPa. Please note that abnormal hepatic elasticity and shear wave velocities may also be identified in clinical settings other than with hepatic fibrosis, such as: acute hepatitis, elevated right heart and central venous pressures including use of beta blockers, veno-occlusive disease (Budd-Chiari), infiltrative processes such as mastocytosis/amyloidosis/infiltrative tumor/lymphoma, extrahepatic cholestasis, with hyperemia in the post-prandial state, and with liver transplantation. Correlation with patient history, laboratory data, and clinical condition recommended. Diagnostic Categories: < or =5 kPa: high  probability of being normal < or =9 kPa: in the absence of other known clinical signs, rules out cACLD >9 kPa and ?13 kPa: suggestive of cACLD, but needs further testing >13 kPa: highly suggestive of cACLD > or =17 kPa: highly suggestive of cACLD with an increased probability of clinically significant portal hypertension IMPRESSION: ULTRASOUND ABDOMEN: Post cholecystectomy. Mildly heterogeneous parenchymal echogenicity of the liver without focal mass or nodularity. Otherwise negative abdominal ultrasound. ULTRASOUND HEPATIC ELASTOGRAPHY: Median kPa:  2.5 Diagnostic category:  < or = 5 kPa: high probability of being normal Electronically Signed   By: Lavonia Dana M.D.   On: 03/16/2021 14:18       Assessment & Plan:   Problem List Items Addressed This Visit     Anemia    Follow cbc.        Autoimmune hepatitis ([place])    Followed by GI.  Follow liver function tests.        B12 deficiency    Continue B12 injections.         Barrett's esophagus with esophagitis    Upper symptoms controlled.  Continues protonix.  EGD 12/2018.  Recommended f/u EGD in 2 years.  Just saw GI. See note.        CKD (chronic kidney disease)   Relevant Orders   Basic metabolic panel (Completed)   Hyperglycemia    Low carb diet and exercise.  Follow met b and a1c.        Relevant Orders   Hemoglobin A1c   Hypertension    On benazepril 37m q day.  Off atenolol.  Blood pressure doing well.  Continue pressures.  Follow metabolic panel.        Relevant Medications   benazepril (LOTENSIN) 40 MG tablet   Other Relevant Orders   Lipid panel   Hepatic function panel   Basic metabolic panel   Hypothyroidism    On thyroid replacement.  Follow tsh.        Relevant  Orders   TSH   Numbness in feet    Numbness in her feet and cramping in her hand.  Discussed.  Receiving B12 injections.  Refer to neurology for evaluation and question of need for NCS.         Other Visit Diagnoses     Estrogen  deficiency    -  Primary   Relevant Orders   DG Bone Density   Visit for screening mammogram       Relevant Orders   MM 3D SCREEN BREAST BILATERAL        Einar Pheasant, MD

## 2021-06-16 ENCOUNTER — Other Ambulatory Visit: Payer: Self-pay

## 2021-06-16 ENCOUNTER — Ambulatory Visit (INDEPENDENT_AMBULATORY_CARE_PROVIDER_SITE_OTHER): Payer: Medicare Other

## 2021-06-16 VITALS — BP 115/70 | HR 75 | Temp 97.7°F | Resp 15 | Ht 61.0 in | Wt 152.2 lb

## 2021-06-16 DIAGNOSIS — Z0000 Encounter for general adult medical examination without abnormal findings: Secondary | ICD-10-CM | POA: Diagnosis not present

## 2021-06-16 NOTE — Progress Notes (Signed)
Subjective:   Lori [name] is a 87 y.o. female who presents for Medicare Annual (Subsequent) preventive examination.  Review of Systems    No ROS.  Medicare Wellness    Cardiac Risk Factors include: advanced age (>87mn, >>54women)     Objective:    Today's Vitals   06/16/21 1008  BP: 115/70  Pulse: 75  Resp: 15  Temp: 97.7 F (36.5 C)  TempSrc: Oral  SpO2: 95%  Weight: 152 lb 3.2 oz (69 kg)  Height: 5' 1" (1.549 m)   Body mass index is 28.76 kg/m.  Advanced Directives 06/16/2021 06/15/2020 06/01/2019 01/13/2019 06/25/2018 06/16/2018 05/20/2018  Does Patient Have a Medical Advance Directive? Yes No No No Yes Yes -  Type of Advance Directive [place] - Living will  Does patient want to make changes to medical advance directive? No - Patient declined No - Patient declined - - No - Patient declined - -  Copy of [place]? No - copy requested - - - No - copy requested - -  Would patient like information on creating a medical advance directive? - - Yes (MAU/Ambulatory/Procedural Areas - Information given) No - Patient declined - - -    Current Medications (verified) Outpatient Encounter Medications as of 06/16/2021  Medication Sig   aspirin 81 MG tablet Take 81 mg by mouth daily.   benazepril (LOTENSIN) 40 MG tablet Take 1 tablet (40 mg total) by mouth daily.   calcitonin, salmon, (MIACALCIN/FORTICAL) 200 UNIT/ACT nasal spray One spray alternating nostrils qod   Calcium-Phosphorus-Vitamin D (CITRACAL +D3 PO) Take 1 tablet by mouth daily.   chlorzoxazone (PARAFON) 500 MG tablet TAKE 1 TABLET BY MOUTH EVERY DAY AT BEDTIME AS NEEDED.  Do not take with ambien.   Cholecalciferol (D3-1000 PO) Take 1,000 Units by mouth daily.    Cyanocobalamin (B-12) 1000 MCG/ML KIT Inject 1,000 mcg as directed every 30 (thirty) days.    GLUCOSAMINE-CHONDROITIN PO Take 1 tablet by mouth 2 (two) times  daily.    levothyroxine (SYNTHROID) 88 MCG tablet TAKE 1 TABLET BY MOUTH EVERY DAY   Magnesium Oxide 400 (240 Mg) MG TABS Take 1 tablet (400 mg total) by mouth 2 (two) times daily.   Mesalamine (ASACOL) 400 MG CPDR DR capsule Take 400 mg by mouth 2 (two) times daily.    pantoprazole (PROTONIX) 40 MG tablet Take 40 mg by mouth 2 (two) times daily.    Wheat Dextrin (BENEFIBER DRINK MIX) PACK Take 1 each by mouth daily.    zolpidem (AMBIEN CR) 6.25 MG CR tablet TAKE 1 TABLET BY MOUTH EVERY DAY AT BEDTIME AS NEEDED   No facility-administered encounter medications on file as of 06/16/2021.    Allergies (verified) Penicillins, Daypro [oxaprozin], and Lodine [etodolac]   History: Past Medical History:  Diagnosis Date   Anemia    iron deficient   Arthritis    Autoimmune hepatitis ([place] 05/10/2016   Hep C   B12 deficiency    Barrett esophagus 01/2016   Bradycardia    Diverticulosis    GERD (gastroesophageal reflux disease)    Hypertension    Hypothyroidism    Ulcer disease    Past Surgical History:  Procedure Laterality Date   CHOLECYSTECTOMY N/A 06/25/2018   Procedure: LAPAROSCOPIC CHOLECYSTECTOMY WITH INTRAOPERATIVE CHOLANGIOGRAM;  Surgeon: BRobert Bellow MD;  Location: [facility] ORS;  Service: General;  Laterality: N/A;   ESOPHAGOGASTRODUODENOSCOPY (EGD) WITH PROPOFOL  N/A 01/30/2016   Procedure: ESOPHAGOGASTRODUODENOSCOPY (EGD) WITH PROPOFOL;  Surgeon: Lollie Sails, MD;  Location: [facility] ENDOSCOPY;  Service: Endoscopy;  Laterality: N/A;   ESOPHAGOGASTRODUODENOSCOPY (EGD) WITH PROPOFOL N/A 01/14/2018   Procedure: ESOPHAGOGASTRODUODENOSCOPY (EGD) WITH PROPOFOL;  Surgeon: Lollie Sails, MD;  Location: [facility] ENDOSCOPY;  Service: Endoscopy;  Laterality: N/A;   ESOPHAGOGASTRODUODENOSCOPY (EGD) WITH PROPOFOL N/A 05/20/2018   Procedure: ESOPHAGOGASTRODUODENOSCOPY (EGD) WITH PROPOFOL;  Surgeon: Lollie Sails, MD;  Location: [facility] ENDOSCOPY;  Service: Endoscopy;  Laterality: N/A;    ESOPHAGOGASTRODUODENOSCOPY (EGD) WITH PROPOFOL N/A 01/13/2019   Procedure: ESOPHAGOGASTRODUODENOSCOPY (EGD) WITH PROPOFOL;  Surgeon: Lollie Sails, MD;  Location: [facility] ENDOSCOPY;  Service: Endoscopy;  Laterality: N/A;   HERNIA REPAIR     lap hiatal hernia repair  07/08/08   LEFT OOPHORECTOMY  1993   benign ovarian cyst   TUBAL LIGATION  1981   Family History  Problem Relation Age of Onset   Stroke Mother    Breast cancer Neg Hx    Colon cancer Neg Hx    Social History   Socioeconomic History   Marital status: Widowed    Spouse name: Not on file   Number of children: 0   Years of education: Not on file   Highest education level: Not on file  Occupational History   Not on file  Tobacco Use   Smoking status: Never   Smokeless tobacco: Never  Vaping Use   Vaping Use: Never used  Substance and Sexual Activity   Alcohol use: No    Alcohol/week: 0.0 standard drinks   Drug use: No   Sexual activity: Never  Other Topics Concern   Not on file  Social History Narrative   Not on file   Social Determinants of Health   Financial Resource Strain: Low Risk    Difficulty of Paying Living Expenses: Not hard at all  Food Insecurity: No Food Insecurity   Worried About Charity fundraiser in the Last Year: Never true   [place] in the Last Year: Never true  Transportation Needs: No Transportation Needs   Lack of Transportation (Medical): No   Lack of Transportation (Non-Medical): No  Physical Activity: Insufficiently Active   Days of Exercise per Week: 3 days   Minutes of Exercise per Session: 20 min  Stress: No Stress Concern Present   Feeling of Stress : Not at all  Social Connections: Unknown   Frequency of Communication with Friends and Family: More than three times a week   Frequency of Social Gatherings with Friends and Family: More than three times a week   Attends Religious Services: Not on Electrical engineer or Organizations: Not on file   Attends  Archivist Meetings: Not on file   Marital Status: Not on file    Tobacco Counseling Counseling given: Not Answered   Clinical Intake:  Pre-visit preparation completed: Yes           How often do you need to have someone help you when you read instructions, pamphlets, or other written materials from your doctor or pharmacy?: 1 - Never    Interpreter Needed?: No      Activities of Daily Living In your present state of health, do you have any difficulty performing the following activities: 06/16/2021  Hearing? N  Vision? N  Difficulty concentrating or making decisions? N  Walking or climbing stairs? N  Dressing or bathing? N  Doing errands, shopping? N  Preparing Food and eating ? N  Using the Toilet? N  In the past six months, have you accidently leaked urine? N  Do you have problems with loss of bowel control? N  Managing your Medications? N  Managing your Finances? N  Housekeeping or managing your Housekeeping? N  Some recent data might be hidden    Patient Care Team: Einar Pheasant, MD as PCP - General (Internal Medicine)  Indicate any recent Medical Services you may have received from other than [facility] providers in the past year (date may be approximate).     Assessment:   This is a routine wellness examination for [place].  Hearing/Vision screen Hearing Screening - Comments:: Followed by [facility] Hearing aid, bilateral  Last visit 04/2021 Vision Screening - Comments:: Followed by [facility] Wears corrective lenses They have seen their ophthalmologist in the last 12 months.   Dietary issues and exercise activities discussed: Current Exercise Habits: Home exercise routine, Intensity: Mild   Goals Addressed             This Visit's Progress    Maintain Healthy Lifestyle       Walk for exercise Stay hydrated Healthy foods        Depression Screen PHQ 2/9 Scores 06/16/2021 04/06/2021 06/15/2020 06/07/2020 05/22/2019 03/27/2018  03/26/2017  PHQ - 2 Score 0 0 0 0 0 0 0  PHQ- 9 Score - - - - 0 - -    Fall Risk Fall Risk  06/16/2021 04/06/2021 06/15/2020 06/07/2020 05/22/2019  Falls in the past year? 0 0 0 0 0  Number falls in past yr: 0 - 0 - 0  Injury with Fall? 0 - - - 0  Comment - - - - -  Follow up Falls evaluation completed - Falls evaluation completed Falls evaluation completed Falls evaluation completed    [place]: Handrails in use when climbing stairs? Yes Home free of loose throw rugs in walkways, pet beds, electrical cords, etc? Yes  Adequate lighting in your home to reduce risk of falls? Yes   ASSISTIVE DEVICES UTILIZED TO PREVENT FALLS: Life alert? No  Use of a cane, walker or w/c? No   TIMED UP AND GO: Was the test performed? Yes .  Length of time to ambulate 10 feet: 10 sec.   Gait steady and fast without use of assistive device  Cognitive Function: MMSE - Mini Mental State Exam 03/26/2017 03/26/2016  Orientation to time 5 5  Orientation to Place 5 5  Registration 3 3  Attention/ Calculation 5 5  Recall 3 3  Language- name 2 objects 2 2  Language- repeat 1 1  Language- follow 3 step command 3 3  Language- read & follow direction 1 1  Write a sentence 1 1  Copy design 1 1  Total score 30 30     6CIT Screen 06/16/2021 06/15/2020 06/01/2019 03/27/2018  What Year? 0 points 0 points 0 points 0 points  What month? 0 points 0 points 0 points 0 points  What time? 0 points 0 points 0 points 0 points  Count back from 20 - 0 points 0 points 0 points  Months in reverse 0 points 0 points 0 points 0 points  Repeat phrase - 2 points 0 points 0 points  Total Score - 2 0 0    Immunizations Immunization History  Administered Date(s) Administered   Fluad Quad(high Dose 65+) 09/08/2019, 09/20/2020   Influenza, High Dose Seasonal PF  10/01/2017, 09/12/2018   Influenza,inj,Quad PF,6+ Mos 08/31/2014, 09/07/2015, 08/21/2016   Influenza-Unspecified 09/11/2012, 10/14/2013,  08/31/2014, 09/07/2015, 08/21/2016   PFIZER(Purple Top)SARS-COV-2 Vaccination 01/25/2020, 02/15/2020, 12/27/2020   Pneumococcal Conjugate-13 03/08/2014, 03/02/2015   Pneumococcal Polysaccharide-23 07/22/2008   Zoster Recombinat (Shingrix) 05/14/2018, 07/15/2018   Zoster, Live 12/31/2008   TDAP status: Due, Education has been provided regarding the importance of this vaccine. Advised may receive this vaccine at local pharmacy or Health Dept. Aware to provide a copy of the vaccination record if obtained from local pharmacy or Health Dept. Verbalized acceptance and understanding. Deferred.   Health Maintenance Health Maintenance  Topic Date Due   COVID-19 Vaccine (4 - Booster for [place] series) 06/28/2021 (Originally 03/27/2021)   TETANUS/TDAP  06/16/2022 (Originally 10/01/1953)   MAMMOGRAM  07/18/2021   INFLUENZA VACCINE  07/31/2021   DEXA SCAN  Completed   PNA vac Low Risk Adult  Completed   Zoster Vaccines- Shingrix  Completed   HPV VACCINES  Aged Out    Colorectal cancer screening: No longer required.   Mammogram status: Completed 07/18/20. Repeat every year scheduled in July.  Bone Density status: Completed 02/13/17. Results reflect: Bone density results: OSTEOPENIA. Repeat every 2 years. scheduled in July.  Lung Cancer Screening: (Low Dose CT Chest recommended if Age 85-80 years, 30 pack-year currently smoking OR have quit w/in 15years.) does not qualify.   Hepatitis C Screening: does not qualify  Vision Screening: Recommended annual ophthalmology exams for early detection of glaucoma and other disorders of the eye. Is the patient up to date with their annual eye exam?  Yes   Dental Screening: Recommended annual dental exams for proper oral hygiene  Community Resource Referral / Chronic Care Management: CRR required this visit?  No   CCM required this visit?  No      Plan:   Keep all routine maintenance appointments.   I have personally reviewed and noted the following  in the patient's chart:   Medical and social history Use of alcohol, tobacco or illicit drugs  Current medications and supplements including opioid prescriptions.  Functional ability and status Nutritional status Physical activity Advanced directives List of other physicians Hospitalizations, surgeries, and ER visits in previous 12 months Vitals Screenings to include cognitive, depression, and falls Referrals and appointments  In addition, I have reviewed and discussed with patient certain preventive protocols, quality metrics, and best practice recommendations. A written personalized care plan for preventive services as well as general preventive health recommendations were provided to patient.     Varney Biles, LPN   01/30/8656

## 2021-06-16 NOTE — Patient Instructions (Addendum)
Lori [name] , Thank you for taking time to come for your Medicare Wellness Visit. I appreciate your ongoing commitment to your health goals. Please review the following plan we discussed and let me know if I can assist you in the future.   These are the goals we discussed:  Goals      Maintain Healthy Lifestyle     Walk for exercise Stay hydrated Healthy foods         This is a list of the screening recommended for you and due dates:  Health Maintenance  Topic Date Due   COVID-19 Vaccine (4 - Booster for Pfizer series) 06/28/2021*   Tetanus Vaccine  06/16/2022*   Mammogram  07/18/2021   Flu Shot  07/31/2021   DEXA scan (bone density measurement)  Completed   Pneumonia vaccines  Completed   Zoster (Shingles) Vaccine  Completed   HPV Vaccine  Aged Out  *Topic was postponed. The date shown is not the original due date.     Advanced directives: End of life planning; Advance aging; Advanced directives discussed.  Copy of current HCPOA/Living Will requested.    Conditions/risks identified: none new  Follow up in one year for your annual wellness visit    Preventive Care 65 Years and Older, Female Preventive care refers to lifestyle choices and visits with your health care provider that can promote health and wellness. What does preventive care include? A yearly physical exam. This is also called an annual well check. Dental exams once or twice a year. Routine eye exams. Ask your health care provider how often you should have your eyes checked. Personal lifestyle choices, including: Daily care of your teeth and gums. Regular physical activity. Eating a healthy diet. Avoiding tobacco and drug use. Limiting alcohol use. Practicing safe sex. Taking low-dose aspirin every day. Taking vitamin and mineral supplements as recommended by your health care provider. What happens during an annual well check? The services and screenings done by your health care provider during your  annual well check will depend on your age, overall health, lifestyle risk factors, and family history of disease. Counseling  Your health care provider may ask you questions about your: Alcohol use. Tobacco use. Drug use. Emotional well-being. Home and relationship well-being. Sexual activity. Eating habits. History of falls. Memory and ability to understand (cognition). Work and work Statistician. Reproductive health. Screening  You may have the following tests or measurements: Height, weight, and BMI. Blood pressure. Lipid and cholesterol levels. These may be checked every 5 years, or more frequently if you are over 16 years old. Skin check. Lung cancer screening. You may have this screening every year starting at age 88 if you have a 30-pack-year history of smoking and currently smoke or have quit within the past 15 years. Fecal occult blood test (FOBT) of the stool. You may have this test every year starting at age 36. Flexible sigmoidoscopy or colonoscopy. You may have a sigmoidoscopy every 5 years or a colonoscopy every 10 years starting at age 7. Hepatitis C blood test. Hepatitis B blood test. Sexually transmitted disease (STD) testing. Diabetes screening. This is done by checking your blood sugar (glucose) after you have not eaten for a while (fasting). You may have this done every 1-3 years. Bone density scan. This is done to screen for osteoporosis. You may have this done starting at age 94. Mammogram. This may be done every 1-2 years. Talk to your health care provider about how often you should have regular  mammograms. Talk with your health care provider about your test results, treatment options, and if necessary, the need for more tests. Vaccines  Your health care provider may recommend certain vaccines, such as: Influenza vaccine. This is recommended every year. Tetanus, diphtheria, and acellular pertussis (Tdap, Td) vaccine. You may need a Td booster every 10  years. Zoster vaccine. You may need this after age 18. Pneumococcal 13-valent conjugate (PCV13) vaccine. One dose is recommended after age 48. Pneumococcal polysaccharide (PPSV23) vaccine. One dose is recommended after age 52. Talk to your health care provider about which screenings and vaccines you need and how often you need them. This information is not intended to replace advice given to you by your health care provider. Make sure you discuss any questions you have with your health care provider. Document Released: 01/13/2016 Document Revised: 09/05/2016 Document Reviewed: 10/18/2015 Elsevier Interactive Patient Education  2017 [place] Prevention in the Home Falls can cause injuries. They can happen to people of all ages. There are many things you can do to make your home safe and to help prevent falls. What can I do on the outside of my home? Regularly fix the edges of walkways and driveways and fix any cracks. Remove anything that might make you trip as you walk through a door, such as a raised step or threshold. Trim any bushes or trees on the path to your home. Use bright outdoor lighting. Clear any walking paths of anything that might make someone trip, such as rocks or tools. Regularly check to see if handrails are loose or broken. Make sure that both sides of any steps have handrails. Any raised decks and porches should have guardrails on the edges. Have any leaves, snow, or ice cleared regularly. Use sand or salt on walking paths during winter. Clean up any spills in your garage right away. This includes oil or grease spills. What can I do in the bathroom? Use night lights. Install grab bars by the toilet and in the tub and shower. Do not use towel bars as grab bars. Use non-skid mats or decals in the tub or shower. If you need to sit down in the shower, use a plastic, non-slip stool. Keep the floor dry. Clean up any water that spills on the floor as soon as it  happens. Remove soap buildup in the tub or shower regularly. Attach bath mats securely with double-sided non-slip rug tape. Do not have throw rugs and other things on the floor that can make you trip. What can I do in the bedroom? Use night lights. Make sure that you have a light by your bed that is easy to reach. Do not use any sheets or blankets that are too big for your bed. They should not hang down onto the floor. Have a firm chair that has side arms. You can use this for support while you get dressed. Do not have throw rugs and other things on the floor that can make you trip. What can I do in the kitchen? Clean up any spills right away. Avoid walking on wet floors. Keep items that you use a lot in easy-to-reach places. If you need to reach something above you, use a strong step stool that has a grab bar. Keep electrical cords out of the way. Do not use floor polish or wax that makes floors slippery. If you must use wax, use non-skid floor wax. Do not have throw rugs and other things on the floor that can  make you trip. What can I do with my stairs? Do not leave any items on the stairs. Make sure that there are handrails on both sides of the stairs and use them. Fix handrails that are broken or loose. Make sure that handrails are as long as the stairways. Check any carpeting to make sure that it is firmly attached to the stairs. Fix any carpet that is loose or worn. Avoid having throw rugs at the top or bottom of the stairs. If you do have throw rugs, attach them to the floor with carpet tape. Make sure that you have a light switch at the top of the stairs and the bottom of the stairs. If you do not have them, ask someone to add them for you. What else can I do to help prevent falls? Wear shoes that: Do not have high heels. Have rubber bottoms. Are comfortable and fit you well. Are closed at the toe. Do not wear sandals. If you use a stepladder: Make sure that it is fully opened.  Do not climb a closed stepladder. Make sure that both sides of the stepladder are locked into place. Ask someone to hold it for you, if possible. Clearly mark and make sure that you can see: Any grab bars or handrails. First and last steps. Where the edge of each step is. Use tools that help you move around (mobility aids) if they are needed. These include: Canes. Walkers. Scooters. Crutches. Turn on the lights when you go into a dark area. Replace any light bulbs as soon as they burn out. Set up your furniture so you have a clear path. Avoid moving your furniture around. If any of your floors are uneven, fix them. If there are any pets around you, be aware of where they are. Review your medicines with your doctor. Some medicines can make you feel dizzy. This can increase your chance of falling. Ask your doctor what other things that you can do to help prevent falls. This information is not intended to replace advice given to you by your health care provider. Make sure you discuss any questions you have with your health care provider. Document Released: 10/13/2009 Document Revised: 05/24/2016 Document Reviewed: 01/21/2015 Elsevier Interactive Patient Education  2017 [employer].

## 2021-06-18 ENCOUNTER — Encounter: Payer: Self-pay | Admitting: Internal Medicine

## 2021-06-18 DIAGNOSIS — R200 Anesthesia of skin: Secondary | ICD-10-CM | POA: Insufficient documentation

## 2021-06-18 NOTE — Assessment & Plan Note (Signed)
Low carb diet and exercise.  Follow met b and a1c.  

## 2021-06-18 NOTE — Assessment & Plan Note (Signed)
Followed by GI.  Follow liver function tests.  

## 2021-06-18 NOTE — Assessment & Plan Note (Signed)
Follow cbc.  

## 2021-06-18 NOTE — Assessment & Plan Note (Signed)
Upper symptoms controlled.  Continues protonix.  EGD 12/2018.  Recommended f/u EGD in 2 years.  Just saw GI. See note.

## 2021-06-18 NOTE — Assessment & Plan Note (Signed)
Avoid antiinflammatories.  Stay hydrated.  Follow metabolic panel.   

## 2021-06-18 NOTE — Assessment & Plan Note (Signed)
Numbness in her feet and cramping in her hand.  Discussed.  Receiving B12 injections.  Refer to neurology for evaluation and question of need for NCS.

## 2021-06-18 NOTE — Assessment & Plan Note (Signed)
On thyroid replacement.  Follow tsh.  

## 2021-06-18 NOTE — Assessment & Plan Note (Signed)
On benazepril 40mg  q day.  Off atenolol.  Blood pressure doing well.  Continue pressures.  Follow metabolic panel.

## 2021-06-18 NOTE — Assessment & Plan Note (Signed)
Continue B12 injections.   

## 2021-06-20 ENCOUNTER — Other Ambulatory Visit: Payer: Self-pay

## 2021-06-20 ENCOUNTER — Ambulatory Visit (INDEPENDENT_AMBULATORY_CARE_PROVIDER_SITE_OTHER): Payer: Medicare Other

## 2021-06-20 DIAGNOSIS — E538 Deficiency of other specified B group vitamins: Secondary | ICD-10-CM | POA: Diagnosis not present

## 2021-06-20 MED ORDER — CYANOCOBALAMIN 1000 MCG/ML IJ SOLN
1000.0000 ug | Freq: Once | INTRAMUSCULAR | Status: AC
  Administered 2021-06-20: 1000 ug via INTRAMUSCULAR

## 2021-06-20 NOTE — Progress Notes (Signed)
Patient presented for B 12 injection to left deltoid, patient voiced no concerns nor showed any signs of distress during injection. 

## 2021-07-11 DIAGNOSIS — H35372 Puckering of macula, left eye: Secondary | ICD-10-CM | POA: Diagnosis not present

## 2021-07-11 DIAGNOSIS — H2513 Age-related nuclear cataract, bilateral: Secondary | ICD-10-CM | POA: Diagnosis not present

## 2021-07-19 ENCOUNTER — Other Ambulatory Visit: Payer: Self-pay

## 2021-07-19 ENCOUNTER — Ambulatory Visit
Admission: RE | Admit: 2021-07-19 | Discharge: 2021-07-19 | Disposition: A | Discharge status: Home / Self Care | Payer: Medicare Other | Source: Ambulatory Visit | Attending: Internal Medicine | Admitting: Internal Medicine

## 2021-07-19 DIAGNOSIS — Z780 Asymptomatic menopausal state: Secondary | ICD-10-CM | POA: Diagnosis not present

## 2021-07-19 DIAGNOSIS — Z1231 Encounter for screening mammogram for malignant neoplasm of breast: Secondary | ICD-10-CM | POA: Diagnosis not present

## 2021-07-19 DIAGNOSIS — E2839 Other primary ovarian failure: Secondary | ICD-10-CM | POA: Insufficient documentation

## 2021-07-19 DIAGNOSIS — M85832 Other specified disorders of bone density and structure, left forearm: Secondary | ICD-10-CM | POA: Diagnosis not present

## 2021-07-19 DIAGNOSIS — M85851 Other specified disorders of bone density and structure, right thigh: Secondary | ICD-10-CM | POA: Diagnosis not present

## 2021-07-21 ENCOUNTER — Other Ambulatory Visit: Payer: Self-pay

## 2021-07-21 ENCOUNTER — Ambulatory Visit (INDEPENDENT_AMBULATORY_CARE_PROVIDER_SITE_OTHER): Payer: Medicare Other

## 2021-07-21 DIAGNOSIS — E538 Deficiency of other specified B group vitamins: Secondary | ICD-10-CM

## 2021-07-21 MED ORDER — CYANOCOBALAMIN 1000 MCG/ML IJ SOLN
1000.0000 ug | Freq: Once | INTRAMUSCULAR | Status: AC
  Administered 2021-07-21: 1000 ug via INTRAMUSCULAR

## 2021-07-21 NOTE — Progress Notes (Signed)
Patient presented for B 12 injection to left deltoid, patient voiced no concerns nor showed any signs of distress during injection. 

## 2021-07-24 ENCOUNTER — Other Ambulatory Visit: Payer: Self-pay | Admitting: Internal Medicine

## 2021-07-25 NOTE — Telephone Encounter (Signed)
RX Refill:ambien Last Seen:06-12-21 Last ordered:05-23-21

## 2021-08-14 DIAGNOSIS — H2512 Age-related nuclear cataract, left eye: Secondary | ICD-10-CM | POA: Diagnosis not present

## 2021-08-17 ENCOUNTER — Encounter: Payer: Self-pay | Admitting: Ophthalmology

## 2021-08-21 ENCOUNTER — Other Ambulatory Visit: Payer: Self-pay | Admitting: Internal Medicine

## 2021-08-22 ENCOUNTER — Other Ambulatory Visit: Payer: Self-pay

## 2021-08-22 ENCOUNTER — Ambulatory Visit (INDEPENDENT_AMBULATORY_CARE_PROVIDER_SITE_OTHER): Payer: Medicare Other

## 2021-08-22 DIAGNOSIS — E538 Deficiency of other specified B group vitamins: Secondary | ICD-10-CM

## 2021-08-22 MED ORDER — CYANOCOBALAMIN 1000 MCG/ML IJ SOLN
1000.0000 ug | Freq: Once | INTRAMUSCULAR | Status: AC
Start: 2021-08-22 — End: 2021-08-22
  Administered 2021-08-22: 1000 ug via INTRAMUSCULAR

## 2021-08-22 NOTE — Progress Notes (Addendum)
Patient presented for B 12 injection to left deltoid, patient voiced no concerns nor showed any signs of distress during injection. 

## 2021-08-29 NOTE — Discharge Instructions (Signed)

## 2021-08-30 ENCOUNTER — Ambulatory Visit: Payer: Medicare Other | Admitting: Anesthesiology

## 2021-08-30 ENCOUNTER — Encounter: Payer: Self-pay | Admitting: Ophthalmology

## 2021-08-30 ENCOUNTER — Other Ambulatory Visit: Payer: Self-pay

## 2021-08-30 ENCOUNTER — Ambulatory Visit
Admission: RE | Admit: 2021-08-30 | Discharge: 2021-08-30 | Disposition: A | Discharge status: Home / Self Care | Payer: Medicare Other | Attending: Ophthalmology | Admitting: Ophthalmology

## 2021-08-30 ENCOUNTER — Encounter
Admission: RE | Disposition: A | Discharge status: Home / Self Care | Payer: Self-pay | Source: Home / Self Care | Attending: Ophthalmology

## 2021-08-30 DIAGNOSIS — Z7982 Long term (current) use of aspirin: Secondary | ICD-10-CM | POA: Insufficient documentation

## 2021-08-30 DIAGNOSIS — H25812 Combined forms of age-related cataract, left eye: Secondary | ICD-10-CM | POA: Diagnosis not present

## 2021-08-30 DIAGNOSIS — Z886 Allergy status to analgesic agent status: Secondary | ICD-10-CM | POA: Insufficient documentation

## 2021-08-30 DIAGNOSIS — Z79899 Other long term (current) drug therapy: Secondary | ICD-10-CM | POA: Insufficient documentation

## 2021-08-30 DIAGNOSIS — Z79890 Hormone replacement therapy (postmenopausal): Secondary | ICD-10-CM | POA: Insufficient documentation

## 2021-08-30 DIAGNOSIS — H2512 Age-related nuclear cataract, left eye: Secondary | ICD-10-CM | POA: Diagnosis not present

## 2021-08-30 DIAGNOSIS — Z880 Allergy status to penicillin: Secondary | ICD-10-CM | POA: Diagnosis not present

## 2021-08-30 HISTORY — PX: CATARACT EXTRACTION W/PHACO: SHX586

## 2021-08-30 SURGERY — PHACOEMULSIFICATION, CATARACT, WITH IOL INSERTION
Anesthesia: Monitor Anesthesia Care | Site: Eye | Laterality: Left

## 2021-08-30 MED ORDER — SIGHTPATH DOSE#1 NA HYALUR & NA CHOND-NA HYALUR IO KIT
PACK | INTRAOCULAR | Status: DC | PRN
  Administered 2021-08-30: 1 via OPHTHALMIC

## 2021-08-30 MED ORDER — TETRACAINE HCL 0.5 % OP SOLN
1.0000 [drp] | OPHTHALMIC | Status: DC | PRN
  Administered 2021-08-30 (×3): 1 [drp] via OPHTHALMIC

## 2021-08-30 MED ORDER — BRIMONIDINE TARTRATE-TIMOLOL 0.2-0.5 % OP SOLN
OPHTHALMIC | Status: DC | PRN
  Administered 2021-08-30: 1 [drp] via OPHTHALMIC

## 2021-08-30 MED ORDER — CYCLOPENTOLATE HCL 2 % OP SOLN
1.0000 [drp] | OPHTHALMIC | Status: AC | PRN
  Administered 2021-08-30 (×3): 1 [drp] via OPHTHALMIC

## 2021-08-30 MED ORDER — FENTANYL CITRATE (PF) 100 MCG/2ML IJ SOLN
INTRAMUSCULAR | Status: DC | PRN
  Administered 2021-08-30: 50 ug via INTRAVENOUS

## 2021-08-30 MED ORDER — MIDAZOLAM HCL 2 MG/2ML IJ SOLN
INTRAMUSCULAR | Status: DC | PRN
  Administered 2021-08-30: 1 mg via INTRAVENOUS

## 2021-08-30 MED ORDER — LACTATED RINGERS IV SOLN: INTRAVENOUS | Status: DC

## 2021-08-30 MED ORDER — ONDANSETRON HCL 4 MG/2ML IJ SOLN: 4.0000 mg | Freq: Once | INTRAMUSCULAR | Status: DC | PRN

## 2021-08-30 MED ORDER — SIGHTPATH DOSE#1 BSS IO SOLN
INTRAOCULAR | Status: DC | PRN
  Administered 2021-08-30: 65 mL via OPHTHALMIC

## 2021-08-30 MED ORDER — MOXIFLOXACIN HCL 0.5 % OP SOLN
OPHTHALMIC | Status: DC | PRN
  Administered 2021-08-30: 0.2 mL via OPHTHALMIC

## 2021-08-30 MED ORDER — SIGHTPATH DOSE#1 BSS IO SOLN
INTRAOCULAR | Status: DC | PRN
  Administered 2021-08-30: 1 mL

## 2021-08-30 MED ORDER — PHENYLEPHRINE HCL 10 % OP SOLN
1.0000 [drp] | OPHTHALMIC | Status: AC | PRN
  Administered 2021-08-30 (×3): 1 [drp] via OPHTHALMIC

## 2021-08-30 MED ORDER — SIGHTPATH DOSE#1 BSS IO SOLN
INTRAOCULAR | Status: DC | PRN
  Administered 2021-08-30: 15 mL

## 2021-08-30 MED ORDER — ACETAMINOPHEN 325 MG PO TABS: 650.0000 mg | ORAL_TABLET | Freq: Once | ORAL | Status: DC | PRN

## 2021-08-30 MED ORDER — ACETAMINOPHEN 160 MG/5ML PO SOLN: 325.0000 mg | ORAL | Status: DC | PRN

## 2021-08-30 SURGICAL SUPPLY — 16 items
CANNULA ANT/CHMB 27GA (MISCELLANEOUS) ×2 IMPLANT
GLOVE SRG 8 PF TXTR STRL LF DI (GLOVE) ×1 IMPLANT
GLOVE SURG ENC TEXT LTX SZ7.5 (GLOVE) ×2 IMPLANT
GLOVE SURG UNDER POLY LF SZ8 (GLOVE) ×2
GOWN STRL REUS W/ TWL LRG LVL3 (GOWN DISPOSABLE) ×2 IMPLANT
GOWN STRL REUS W/TWL LRG LVL3 (GOWN DISPOSABLE) ×4
LENS IOL TECNIS EYHANCE 11.0 (Intraocular Lens) ×2 IMPLANT
MARKER SKIN DUAL TIP RULER LAB (MISCELLANEOUS) ×2 IMPLANT
NEEDLE CAPSULORHEX 25GA (NEEDLE) ×2 IMPLANT
NEEDLE FILTER BLUNT 18X 1/2SAF (NEEDLE) ×2
NEEDLE FILTER BLUNT 18X1 1/2 (NEEDLE) ×2 IMPLANT
PACK EYE AFTER SURG (MISCELLANEOUS) ×2 IMPLANT
SYR 3ML LL SCALE MARK (SYRINGE) ×4 IMPLANT
SYR TB 1ML LUER SLIP (SYRINGE) ×2 IMPLANT
WATER STERILE IRR 250ML POUR (IV SOLUTION) ×2 IMPLANT
WIPE NON LINTING 3.25X3.25 (MISCELLANEOUS) ×2 IMPLANT

## 2021-08-30 NOTE — H&P (Signed)
[facility]   Primary Care Physician:  Einar Pheasant, MD Ophthalmologist: Dr. Leandrew Koyanagi  Pre-Procedure History & Physical: HPI:  Lori [name] is a 87 y.o. female here for ophthalmic surgery.   Past Medical History:  Diagnosis Date   Anemia    iron deficient   Arthritis    Autoimmune hepatitis ([place]) 05/10/2016   Hep C   B12 deficiency    Barrett esophagus 01/2016   Bradycardia    Chronic kidney disease    stage III   Diverticulosis    GERD (gastroesophageal reflux disease)    Hypertension    Hypothyroidism    Ulcer disease     Past Surgical History:  Procedure Laterality Date   CHOLECYSTECTOMY N/A 06/25/2018   Procedure: LAPAROSCOPIC CHOLECYSTECTOMY WITH INTRAOPERATIVE CHOLANGIOGRAM;  Surgeon: Robert Bellow, MD;  Location: [facility] ORS;  Service: General;  Laterality: N/A;   ESOPHAGOGASTRODUODENOSCOPY (EGD) WITH PROPOFOL N/A 01/30/2016   Procedure: ESOPHAGOGASTRODUODENOSCOPY (EGD) WITH PROPOFOL;  Surgeon: Lollie Sails, MD;  Location: [facility] ENDOSCOPY;  Service: Endoscopy;  Laterality: N/A;   ESOPHAGOGASTRODUODENOSCOPY (EGD) WITH PROPOFOL N/A 01/14/2018   Procedure: ESOPHAGOGASTRODUODENOSCOPY (EGD) WITH PROPOFOL;  Surgeon: Lollie Sails, MD;  Location: [facility] ENDOSCOPY;  Service: Endoscopy;  Laterality: N/A;   ESOPHAGOGASTRODUODENOSCOPY (EGD) WITH PROPOFOL N/A 05/20/2018   Procedure: ESOPHAGOGASTRODUODENOSCOPY (EGD) WITH PROPOFOL;  Surgeon: Lollie Sails, MD;  Location: [facility] ENDOSCOPY;  Service: Endoscopy;  Laterality: N/A;   ESOPHAGOGASTRODUODENOSCOPY (EGD) WITH PROPOFOL N/A 01/13/2019   Procedure: ESOPHAGOGASTRODUODENOSCOPY (EGD) WITH PROPOFOL;  Surgeon: Lollie Sails, MD;  Location: [facility] ENDOSCOPY;  Service: Endoscopy;  Laterality: N/A;   HERNIA REPAIR     lap hiatal hernia repair  07/08/08   LEFT OOPHORECTOMY  1993   benign ovarian cyst   TUBAL LIGATION  1981    Prior to Admission medications   Medication Sig Start Date End  Date Taking? Authorizing Provider  aspirin 81 MG tablet Take 81 mg by mouth daily.   Yes [provider]  benazepril (LOTENSIN) 40 MG tablet Take 1 tablet (40 mg total) by mouth daily. 06/12/21  Yes Einar Pheasant, MD  calcitonin, salmon, (MIACALCIN/FORTICAL) 200 UNIT/ACT nasal spray One spray alternating nostrils qod 04/06/21  Yes Scott, Randell Patient, MD  Calcium-Phosphorus-Vitamin D (CITRACAL +D3 PO) Take 1 tablet by mouth daily.   Yes [provider]  chlorzoxazone (PARAFON) 500 MG tablet TAKE 1 TABLET BY MOUTH EVERY DAY AT BEDTIME AS NEEDED.  Do not take with ambien. 06/12/21  Yes Einar Pheasant, MD  Cyanocobalamin (B-12) 1000 MCG/ML KIT Inject 1,000 mcg as directed every 30 (thirty) days.    Yes [provider]  GLUCOSAMINE-CHONDROITIN PO Take 1 tablet by mouth 2 (two) times daily.    Yes [provider]  levothyroxine (SYNTHROID) 88 MCG tablet TAKE 1 TABLET BY MOUTH EVERY DAY 08/21/21  Yes Einar Pheasant, MD  Magnesium Oxide 400 (240 Mg) MG TABS Take 1 tablet (400 mg total) by mouth 2 (two) times daily. 09/24/19  Yes Einar Pheasant, MD  Mesalamine (ASACOL) 400 MG CPDR DR capsule Take 400 mg by mouth 2 (two) times daily.    Yes [provider]  pantoprazole (PROTONIX) 40 MG tablet Take 40 mg by mouth 2 (two) times daily.    Yes [provider]  Wheat Dextrin (BENEFIBER DRINK MIX) PACK Take 1 each by mouth daily.    Yes [provider]  zolpidem (AMBIEN CR) 6.25 MG CR tablet TAKE 1 TABLET BY MOUTH EVERY DAY AT BEDTIME AS  NEEDED 07/25/21  Yes Einar Pheasant, MD  Cholecalciferol (D3-1000 PO) Take 1,000 Units by mouth daily.  Patient not taking: Reported on 08/17/2021    [provider]    Allergies as of 07/13/2021 - Review Complete 06/18/2021  Allergen Reaction Noted   Penicillins Other (See Comments) 12/22/2012   Daypro [oxaprozin] Rash 12/22/2012   Lodine [etodolac] Rash 12/22/2012    Family History  Problem Relation Age  of Onset   Stroke Mother    Breast cancer Neg Hx    Colon cancer Neg Hx     Social History   Socioeconomic History   Marital status: Widowed    Spouse name: Not on file   Number of children: 0   Years of education: Not on file   Highest education level: Not on file  Occupational History   Not on file  Tobacco Use   Smoking status: Never   Smokeless tobacco: Never  Vaping Use   Vaping Use: Never used  Substance and Sexual Activity   Alcohol use: No    Alcohol/week: 0.0 standard drinks   Drug use: No   Sexual activity: Never  Other Topics Concern   Not on file  Social History Narrative   Not on file   Social Determinants of Health   Financial Resource Strain: Low Risk    Difficulty of Paying Living Expenses: Not hard at all  Food Insecurity: No Food Insecurity   Worried About Charity fundraiser in the Last Year: Never true   [place] in the Last Year: Never true  Transportation Needs: No Transportation Needs   Lack of Transportation (Medical): No   Lack of Transportation (Non-Medical): No  Physical Activity: Insufficiently Active   Days of Exercise per Week: 3 days   Minutes of Exercise per Session: 20 min  Stress: No Stress Concern Present   Feeling of Stress : Not at all  Social Connections: Unknown   Frequency of Communication with Friends and Family: More than three times a week   Frequency of Social Gatherings with Friends and Family: More than three times a week   Attends Religious Services: Not on Electrical engineer or Organizations: Not on file   Attends Archivist Meetings: Not on file   Marital Status: Not on file  Intimate Partner Violence: Not At Risk   Fear of Current or Ex-Partner: No   Emotionally Abused: No   Physically Abused: No   Sexually Abused: No    Review of Systems: See HPI, otherwise negative ROS  Physical Exam: BP (!) 153/71   Pulse 72   Temp 98.1 F (36.7 C)   Resp 20   Ht _0  (1.549 m)   Wt  68.9 kg   SpO2 95%   BMI 28.72 kg/m  General:   Alert,  pleasant and cooperative in NAD Head:  Normocephalic and atraumatic. Lungs:  Clear to auscultation.    Heart:  Regular rate and rhythm.   Impression/Plan: Lori [name] is here for ophthalmic surgery.  Risks, benefits, limitations, and alternatives regarding ophthalmic surgery have been reviewed with the patient.  Questions have been answered.  All parties agreeable.   Leandrew Koyanagi, MD  08/30/2021, 12:45 PM

## 2021-08-30 NOTE — Anesthesia Preprocedure Evaluation (Addendum)
Anesthesia Evaluation  Patient identified by MRN, date of birth, ID band Patient awake    Reviewed: Allergy & Precautions, NPO status , Patient's Chart, lab work & pertinent test results  History of Anesthesia Complications Negative for: history of anesthetic complications  Airway Mallampati: II   Neck ROM: Full    Dental   Missing lower left molar:   Pulmonary neg pulmonary ROS,    Pulmonary exam normal breath sounds clear to auscultation       Cardiovascular hypertension, Normal cardiovascular exam Rhythm:Regular Rate:Normal  Bradycardia, PACs   Neuro/Psych negative neurological ROS     GI/Hepatic PUD, GERD (Barrett esophagus)  ,  Endo/Other  Hypothyroidism   Renal/GU Renal disease (CKD)     Musculoskeletal  (+) Arthritis ,   Abdominal   Peds  Hematology  (+) Blood dyscrasia, anemia ,   Anesthesia Other Findings   Reproductive/Obstetrics                             Anesthesia Physical  Anesthesia Plan  ASA: 2  Anesthesia Plan: MAC   Post-op Pain Management:    Induction: Intravenous  PONV Risk Score and Plan: 2 and TIVA, Midazolam and Treatment may vary due to age or medical condition  Airway Management Planned: Nasal Cannula  Additional Equipment:   Intra-op Plan:   Post-operative Plan:   Informed Consent: I have reviewed the patients History and Physical, chart, labs and discussed the procedure including the risks, benefits and alternatives for the proposed anesthesia with the patient or authorized representative who has indicated his/her understanding and acceptance.       Plan Discussed with: CRNA  Anesthesia Plan Comments:        Anesthesia Quick Evaluation  

## 2021-08-30 NOTE — Anesthesia Postprocedure Evaluation (Signed)
Anesthesia Post Note  Patient: Lori [name]  Procedure(s) Performed: CATARACT EXTRACTION PHACO AND INTRAOCULAR LENS PLACEMENT (IOC) LEFT 8.21 01:00.3 (Left: Eye)     Patient location during evaluation: PACU Anesthesia Type: MAC Level of consciousness: awake and alert, oriented and patient cooperative Pain management: pain level controlled Vital Signs Assessment: post-procedure vital signs reviewed and stable Respiratory status: spontaneous breathing, nonlabored ventilation and respiratory function stable Cardiovascular status: blood pressure returned to baseline and stable Postop Assessment: adequate PO intake Anesthetic complications: no   No notable events documented.  Darrin Nipper

## 2021-08-30 NOTE — Anesthesia Procedure Notes (Signed)
Procedure Name: MAC Date/Time: 08/30/2021 1:48 PM Performed by: Mayme Genta, CRNA Pre-anesthesia Checklist: Patient identified, Emergency Drugs available, Suction available, Timeout performed and Patient being monitored Patient Re-evaluated:Patient Re-evaluated prior to induction Oxygen Delivery Method: Nasal cannula Placement Confirmation: positive ETCO2

## 2021-08-30 NOTE — Transfer of Care (Signed)
Immediate Anesthesia Transfer of Care Note  Patient: Lori [name]  Procedure(s) Performed: CATARACT EXTRACTION PHACO AND INTRAOCULAR LENS PLACEMENT (IOC) LEFT 8.21 01:00.3 (Left: Eye)  Patient Location: PACU  Anesthesia Type: MAC  Level of Consciousness: awake, alert  and patient cooperative  Airway and Oxygen Therapy: Patient Spontanous Breathing and Patient connected to supplemental oxygen  Post-op Assessment: Post-op Vital signs reviewed, Patient's Cardiovascular Status Stable, Respiratory Function Stable, Patent Airway and No signs of Nausea or vomiting  Post-op Vital Signs: Reviewed and stable  Complications: No notable events documented.

## 2021-08-30 NOTE — Op Note (Signed)
  OPERATIVE NOTE  Lori [name] [id number] 08/30/2021   PREOPERATIVE DIAGNOSIS:  Nuclear sclerotic cataract left eye. H25.12   POSTOPERATIVE DIAGNOSIS:    Nuclear sclerotic cataract left eye.     PROCEDURE:  Phacoemusification with posterior chamber intraocular lens placement of the left eye  Ultrasound time: Procedure(s): CATARACT EXTRACTION PHACO AND INTRAOCULAR LENS PLACEMENT (IOC) LEFT 8.21 01:00.3 (Left)  LENS:   Implant Name Type Inv. Item Serial No. Manufacturer Lot No. LRB No. Used Action  LENS IOL TECNIS EYHANCE 11.0 - [id number] Intraocular Lens LENS IOL TECNIS EYHANCE 11.0 [id number]   Left 1 Implanted      SURGEON:  Wyonia Hough, MD   ANESTHESIA:  Topical with tetracaine drops and 2% Xylocaine jelly, augmented with 1% preservative-free intracameral lidocaine.    COMPLICATIONS:  None.   DESCRIPTION OF PROCEDURE:  The patient was identified in the holding room and transported to the operating room and placed in the supine position under the operating microscope.  The left eye was identified as the operative eye and it was prepped and draped in the usual sterile ophthalmic fashion.   A 1 millimeter clear-corneal paracentesis was made at the 1:30 position.  0.5 ml of preservative-free 1% lidocaine was injected into the anterior chamber.  The anterior chamber was filled with Viscoat viscoelastic.  A 2.4 millimeter keratome was used to make a near-clear corneal incision at the 10:30 position.  .  A curvilinear capsulorrhexis was made with a cystotome and capsulorrhexis forceps.  Balanced salt solution was used to hydrodissect and hydrodelineate the nucleus.   Phacoemulsification was then used in stop and chop fashion to remove the lens nucleus and epinucleus.  The remaining cortex was then removed using the irrigation and aspiration handpiece. Provisc was then placed into the capsular bag to distend it for lens placement.  A lens was then injected into  the capsular bag.  The remaining viscoelastic was aspirated.   Wounds were hydrated with balanced salt solution.  The anterior chamber was inflated to a physiologic pressure with balanced salt solution.  No wound leaks were noted. Vigamox 0.2 ml of a '1mg'$  per ml solution was injected into the anterior chamber for a dose of 0.2 mg of intracameral antibiotic at the completion of the case.   Timolol and Brimonidine drops were applied to the eye.  The patient was taken to the recovery room in stable condition without complications of anesthesia or surgery.  Lori [name] 08/30/2021, 2:05 PM

## 2021-08-31 ENCOUNTER — Encounter: Payer: Self-pay | Admitting: Ophthalmology

## 2021-09-08 DIAGNOSIS — H2511 Age-related nuclear cataract, right eye: Secondary | ICD-10-CM | POA: Diagnosis not present

## 2021-09-08 NOTE — Discharge Instructions (Signed)

## 2021-09-10 NOTE — Anesthesia Preprocedure Evaluation (Addendum)
Anesthesia Evaluation  Patient identified by MRN, date of birth, ID band Patient awake    Reviewed: Allergy & Precautions, NPO status , Patient's Chart, lab work & pertinent test results  History of Anesthesia Complications Negative for: history of anesthetic complications  Airway Mallampati: II   Neck ROM: Full    Dental   Missing lower left molar:   Pulmonary neg pulmonary ROS,    Pulmonary exam normal breath sounds clear to auscultation       Cardiovascular hypertension, Normal cardiovascular exam Rhythm:Regular Rate:Normal  Bradycardia, PACs   Neuro/Psych negative neurological ROS     GI/Hepatic PUD, GERD (Barrett esophagus)  ,  Endo/Other  Hypothyroidism   Renal/GU Renal disease (CKD)     Musculoskeletal  (+) Arthritis ,   Abdominal   Peds  Hematology  (+) Blood dyscrasia, anemia ,   Anesthesia Other Findings   Reproductive/Obstetrics                             Anesthesia Physical  Anesthesia Plan  ASA: 2  Anesthesia Plan: MAC   Post-op Pain Management:    Induction: Intravenous  PONV Risk Score and Plan: 2 and TIVA, Midazolam and Treatment may vary due to age or medical condition  Airway Management Planned: Nasal Cannula  Additional Equipment:   Intra-op Plan:   Post-operative Plan:   Informed Consent: I have reviewed the patients History and Physical, chart, labs and discussed the procedure including the risks, benefits and alternatives for the proposed anesthesia with the patient or authorized representative who has indicated his/her understanding and acceptance.       Plan Discussed with: CRNA  Anesthesia Plan Comments:        Anesthesia Quick Evaluation

## 2021-09-11 DIAGNOSIS — Z20822 Contact with and (suspected) exposure to covid-19: Secondary | ICD-10-CM | POA: Diagnosis not present

## 2021-09-13 ENCOUNTER — Ambulatory Visit
Admission: RE | Admit: 2021-09-13 | Discharge: 2021-09-13 | Disposition: A | Discharge status: Home / Self Care | Payer: Medicare Other | Attending: Ophthalmology | Admitting: Ophthalmology

## 2021-09-13 ENCOUNTER — Ambulatory Visit: Payer: Medicare Other | Admitting: Anesthesiology

## 2021-09-13 ENCOUNTER — Encounter
Admission: RE | Disposition: A | Discharge status: Home / Self Care | Payer: Self-pay | Source: Home / Self Care | Attending: Ophthalmology

## 2021-09-13 ENCOUNTER — Other Ambulatory Visit: Payer: Self-pay

## 2021-09-13 ENCOUNTER — Encounter: Payer: Self-pay | Admitting: Ophthalmology

## 2021-09-13 DIAGNOSIS — Z79899 Other long term (current) drug therapy: Secondary | ICD-10-CM | POA: Insufficient documentation

## 2021-09-13 DIAGNOSIS — Z880 Allergy status to penicillin: Secondary | ICD-10-CM | POA: Diagnosis not present

## 2021-09-13 DIAGNOSIS — Z7982 Long term (current) use of aspirin: Secondary | ICD-10-CM | POA: Insufficient documentation

## 2021-09-13 DIAGNOSIS — Z886 Allergy status to analgesic agent status: Secondary | ICD-10-CM | POA: Diagnosis not present

## 2021-09-13 DIAGNOSIS — Z9842 Cataract extraction status, left eye: Secondary | ICD-10-CM | POA: Insufficient documentation

## 2021-09-13 DIAGNOSIS — H25811 Combined forms of age-related cataract, right eye: Secondary | ICD-10-CM | POA: Diagnosis not present

## 2021-09-13 DIAGNOSIS — H2511 Age-related nuclear cataract, right eye: Secondary | ICD-10-CM | POA: Insufficient documentation

## 2021-09-13 DIAGNOSIS — Z79890 Hormone replacement therapy (postmenopausal): Secondary | ICD-10-CM | POA: Diagnosis not present

## 2021-09-13 HISTORY — PX: CATARACT EXTRACTION W/PHACO: SHX586

## 2021-09-13 SURGERY — PHACOEMULSIFICATION, CATARACT, WITH IOL INSERTION
Anesthesia: Monitor Anesthesia Care | Site: Eye | Laterality: Right

## 2021-09-13 MED ORDER — TETRACAINE HCL 0.5 % OP SOLN
1.0000 [drp] | OPHTHALMIC | Status: DC | PRN
  Administered 2021-09-13 (×3): 1 [drp] via OPHTHALMIC

## 2021-09-13 MED ORDER — ACETAMINOPHEN 325 MG PO TABS: 650.0000 mg | ORAL_TABLET | Freq: Once | ORAL | Status: DC | PRN

## 2021-09-13 MED ORDER — SIGHTPATH DOSE#1 BSS IO SOLN
INTRAOCULAR | Status: DC | PRN
  Administered 2021-09-13: 68 mL via OPHTHALMIC

## 2021-09-13 MED ORDER — MOXIFLOXACIN HCL 0.5 % OP SOLN
OPHTHALMIC | Status: DC | PRN
  Administered 2021-09-13: 0.2 mL via OPHTHALMIC

## 2021-09-13 MED ORDER — SIGHTPATH DOSE#1 BSS IO SOLN
INTRAOCULAR | Status: DC | PRN
  Administered 2021-09-13: 1 mL via INTRAMUSCULAR

## 2021-09-13 MED ORDER — SIGHTPATH DOSE#1 NA HYALUR & NA CHOND-NA HYALUR IO KIT
PACK | INTRAOCULAR | Status: DC | PRN
  Administered 2021-09-13: 1 via OPHTHALMIC

## 2021-09-13 MED ORDER — LACTATED RINGERS IV SOLN: INTRAVENOUS | Status: DC

## 2021-09-13 MED ORDER — SIGHTPATH DOSE#1 BSS IO SOLN
INTRAOCULAR | Status: DC | PRN
  Administered 2021-09-13: 15 mL

## 2021-09-13 MED ORDER — MIDAZOLAM HCL 2 MG/2ML IJ SOLN
INTRAMUSCULAR | Status: DC | PRN
  Administered 2021-09-13: 1 mg via INTRAVENOUS

## 2021-09-13 MED ORDER — BRIMONIDINE TARTRATE-TIMOLOL 0.2-0.5 % OP SOLN
OPHTHALMIC | Status: DC | PRN
  Administered 2021-09-13: 1 [drp] via OPHTHALMIC

## 2021-09-13 MED ORDER — ONDANSETRON HCL 4 MG/2ML IJ SOLN: 4.0000 mg | Freq: Once | INTRAMUSCULAR | Status: DC | PRN

## 2021-09-13 MED ORDER — FENTANYL CITRATE (PF) 100 MCG/2ML IJ SOLN
INTRAMUSCULAR | Status: DC | PRN
  Administered 2021-09-13: 50 ug via INTRAVENOUS

## 2021-09-13 MED ORDER — ACETAMINOPHEN 160 MG/5ML PO SOLN: 325.0000 mg | ORAL | Status: DC | PRN

## 2021-09-13 MED ORDER — ARMC OPHTHALMIC DILATING DROPS
1.0000 "application " | OPHTHALMIC | Status: DC | PRN
  Administered 2021-09-13 (×3): 1 via OPHTHALMIC

## 2021-09-13 SURGICAL SUPPLY — 16 items
CANNULA ANT/CHMB 27GA (MISCELLANEOUS) ×2 IMPLANT
GLOVE SRG 8 PF TXTR STRL LF DI (GLOVE) ×1 IMPLANT
GLOVE SURG ENC TEXT LTX SZ7.5 (GLOVE) ×2 IMPLANT
GLOVE SURG UNDER POLY LF SZ8 (GLOVE) ×2
GOWN STRL REUS W/ TWL LRG LVL3 (GOWN DISPOSABLE) ×2 IMPLANT
GOWN STRL REUS W/TWL LRG LVL3 (GOWN DISPOSABLE) ×4
LENS IOL TECNIS EYHANCE 8.5 (Intraocular Lens) ×2 IMPLANT
MARKER SKIN DUAL TIP RULER LAB (MISCELLANEOUS) ×2 IMPLANT
NEEDLE CAPSULORHEX 25GA (NEEDLE) ×2 IMPLANT
NEEDLE FILTER BLUNT 18X 1/2SAF (NEEDLE) ×2
NEEDLE FILTER BLUNT 18X1 1/2 (NEEDLE) ×2 IMPLANT
PACK EYE AFTER SURG (MISCELLANEOUS) ×2 IMPLANT
SYR 3ML LL SCALE MARK (SYRINGE) ×4 IMPLANT
SYR TB 1ML LUER SLIP (SYRINGE) ×2 IMPLANT
WATER STERILE IRR 250ML POUR (IV SOLUTION) ×2 IMPLANT
WIPE NON LINTING 3.25X3.25 (MISCELLANEOUS) ×2 IMPLANT

## 2021-09-13 NOTE — H&P (Signed)
[facility]   Primary Care Physician:  Einar Pheasant, MD Ophthalmologist: Dr. Leandrew Koyanagi  Pre-Procedure History & Physical: HPI:  Lori [name] is a 87 y.o. female here for ophthalmic surgery.   Past Medical History:  Diagnosis Date   Anemia    iron deficient   Arthritis    Autoimmune hepatitis ([place]) 05/10/2016   Hep C   B12 deficiency    Barrett esophagus 01/2016   Bradycardia    Chronic kidney disease    stage III   Diverticulosis    GERD (gastroesophageal reflux disease)    Hypertension    Hypothyroidism    Ulcer disease     Past Surgical History:  Procedure Laterality Date   CATARACT EXTRACTION W/PHACO Left 08/30/2021   Procedure: CATARACT EXTRACTION PHACO AND INTRAOCULAR LENS PLACEMENT ([place]) LEFT 8.21 01:00.3;  Surgeon: Leandrew Koyanagi, MD;  Location: [place];  Service: Ophthalmology;  Laterality: Left;   CHOLECYSTECTOMY N/A 06/25/2018   Procedure: LAPAROSCOPIC CHOLECYSTECTOMY WITH INTRAOPERATIVE CHOLANGIOGRAM;  Surgeon: Robert Bellow, MD;  Location: [facility] ORS;  Service: General;  Laterality: N/A;   ESOPHAGOGASTRODUODENOSCOPY (EGD) WITH PROPOFOL N/A 01/30/2016   Procedure: ESOPHAGOGASTRODUODENOSCOPY (EGD) WITH PROPOFOL;  Surgeon: Lollie Sails, MD;  Location: [facility] ENDOSCOPY;  Service: Endoscopy;  Laterality: N/A;   ESOPHAGOGASTRODUODENOSCOPY (EGD) WITH PROPOFOL N/A 01/14/2018   Procedure: ESOPHAGOGASTRODUODENOSCOPY (EGD) WITH PROPOFOL;  Surgeon: Lollie Sails, MD;  Location: [facility] ENDOSCOPY;  Service: Endoscopy;  Laterality: N/A;   ESOPHAGOGASTRODUODENOSCOPY (EGD) WITH PROPOFOL N/A 05/20/2018   Procedure: ESOPHAGOGASTRODUODENOSCOPY (EGD) WITH PROPOFOL;  Surgeon: Lollie Sails, MD;  Location: [facility] ENDOSCOPY;  Service: Endoscopy;  Laterality: N/A;   ESOPHAGOGASTRODUODENOSCOPY (EGD) WITH PROPOFOL N/A 01/13/2019   Procedure: ESOPHAGOGASTRODUODENOSCOPY (EGD) WITH PROPOFOL;  Surgeon: Lollie Sails, MD;  Location:  [facility] ENDOSCOPY;  Service: Endoscopy;  Laterality: N/A;   HERNIA REPAIR     lap hiatal hernia repair  07/08/08   LEFT OOPHORECTOMY  1993   benign ovarian cyst   TUBAL LIGATION  1981    Prior to Admission medications   Medication Sig Start Date End Date Taking? Authorizing Provider  aspirin 81 MG tablet Take 81 mg by mouth daily.   Yes [provider]  benazepril (LOTENSIN) 40 MG tablet Take 1 tablet (40 mg total) by mouth daily. 06/12/21  Yes Einar Pheasant, MD  calcitonin, salmon, (MIACALCIN/FORTICAL) 200 UNIT/ACT nasal spray One spray alternating nostrils qod 04/06/21  Yes Scott, Randell Patient, MD  chlorzoxazone (PARAFON) 500 MG tablet TAKE 1 TABLET BY MOUTH EVERY DAY AT BEDTIME AS NEEDED.  Do not take with ambien. 06/12/21  Yes Einar Pheasant, MD  Cyanocobalamin (B-12) 1000 MCG/ML KIT Inject 1,000 mcg as directed every 30 (thirty) days.    Yes [provider]  GLUCOSAMINE-CHONDROITIN PO Take 1 tablet by mouth 2 (two) times daily.    Yes [provider]  levothyroxine (SYNTHROID) 88 MCG tablet TAKE 1 TABLET BY MOUTH EVERY DAY 08/21/21  Yes Einar Pheasant, MD  Magnesium Oxide 400 (240 Mg) MG TABS Take 1 tablet (400 mg total) by mouth 2 (two) times daily. 09/24/19  Yes Einar Pheasant, MD  Mesalamine (ASACOL) 400 MG CPDR DR capsule Take 400 mg by mouth 2 (two) times daily.    Yes [provider]  pantoprazole (PROTONIX) 40 MG tablet Take 40 mg by mouth 2 (two) times daily.    Yes [provider]  Wheat Dextrin (BENEFIBER DRINK MIX) PACK Take 1 each by mouth daily.    Yes [provider]  zolpidem (AMBIEN CR) 6.25 MG CR tablet TAKE 1 TABLET BY MOUTH EVERY DAY AT BEDTIME AS NEEDED 07/25/21  Yes Einar Pheasant, MD  Calcium-Phosphorus-Vitamin D (CITRACAL +D3 PO) Take 1 tablet by mouth daily.    [provider]  Cholecalciferol (D3-1000 PO) Take 1,000 Units by mouth daily.  Patient not taking: Reported on 08/17/2021    [provider]     Allergies as of 07/13/2021 - Review Complete 06/18/2021  Allergen Reaction Noted   Penicillins Other (See Comments) 12/22/2012   Daypro [oxaprozin] Rash 12/22/2012   Lodine [etodolac] Rash 12/22/2012    Family History  Problem Relation Age of Onset   Stroke Mother    Breast cancer Neg Hx    Colon cancer Neg Hx     Social History   Socioeconomic History   Marital status: Widowed    Spouse name: Not on file   Number of children: 0   Years of education: Not on file   Highest education level: Not on file  Occupational History   Not on file  Tobacco Use   Smoking status: Never   Smokeless tobacco: Never  Vaping Use   Vaping Use: Never used  Substance and Sexual Activity   Alcohol use: No    Alcohol/week: 0.0 standard drinks   Drug use: No   Sexual activity: Never  Other Topics Concern   Not on file  Social History Narrative   Not on file   Social Determinants of Health   Financial Resource Strain: Low Risk    Difficulty of Paying Living Expenses: Not hard at all  Food Insecurity: No Food Insecurity   Worried About Charity fundraiser in the Last Year: Never true   [place] in the Last Year: Never true  Transportation Needs: No Transportation Needs   Lack of Transportation (Medical): No   Lack of Transportation (Non-Medical): No  Physical Activity: Insufficiently Active   Days of Exercise per Week: 3 days   Minutes of Exercise per Session: 20 min  Stress: No Stress Concern Present   Feeling of Stress : Not at all  Social Connections: Unknown   Frequency of Communication with Friends and Family: More than three times a week   Frequency of Social Gatherings with Friends and Family: More than three times a week   Attends Religious Services: Not on Electrical engineer or Organizations: Not on file   Attends Archivist Meetings: Not on file   Marital Status: Not on file  Intimate Partner Violence: Not At Risk   Fear of Current or  Ex-Partner: No   Emotionally Abused: No   Physically Abused: No   Sexually Abused: No    Review of Systems: See HPI, otherwise negative ROS  Physical Exam: BP (!) 188/68   Pulse 61   Temp (!) 97.3 F (36.3 C) (Temporal)   Resp 18   Ht _0  (1.549 m)   Wt 69.4 kg   SpO2 96%   BMI 28.91 kg/m  General:   Alert,  pleasant and cooperative in NAD Head:  Normocephalic and atraumatic. Lungs:  Clear to auscultation.    Heart:  Regular rate and rhythm.   Impression/Plan: Lori [name] is here for ophthalmic surgery.  Risks, benefits, limitations, and alternatives regarding ophthalmic surgery have been reviewed with the patient.  Questions have been answered.  All parties agreeable.   Leandrew Koyanagi, MD  09/13/2021, 7:39 AM

## 2021-09-13 NOTE — Transfer of Care (Signed)
Immediate Anesthesia Transfer of Care Note  Patient: Lori [name]  Procedure(s) Performed: CATARACT EXTRACTION PHACO AND INTRAOCULAR LENS PLACEMENT (IOC) RIGHT (Right: Eye)  Patient Location: PACU  Anesthesia Type: MAC  Level of Consciousness: awake, alert  and patient cooperative  Airway and Oxygen Therapy: Patient Spontanous Breathing and Patient connected to supplemental oxygen  Post-op Assessment: Post-op Vital signs reviewed, Patient's Cardiovascular Status Stable, Respiratory Function Stable, Patent Airway and No signs of Nausea or vomiting  Post-op Vital Signs: Reviewed and stable  Complications: No notable events documented.

## 2021-09-13 NOTE — Op Note (Signed)
  LOCATION:  [place]   PREOPERATIVE DIAGNOSIS:    Nuclear sclerotic cataract right eye. H25.11   POSTOPERATIVE DIAGNOSIS:  Nuclear sclerotic cataract right eye.     PROCEDURE:  Phacoemusification with posterior chamber intraocular lens placement of the right eye   ULTRASOUND TIME: Procedure(s) with comments: CATARACT EXTRACTION PHACO AND INTRAOCULAR LENS PLACEMENT (IOC) RIGHT (Right) - 8.11 00:59.8  LENS:   Implant Name Type Inv. Item Serial No. Manufacturer Lot No. LRB No. Used Action  LENS IOL TECNIS EYHANCE 8.5 - [id number] Intraocular Lens LENS IOL TECNIS EYHANCE 8.5 [id number]   Right 1 Implanted         SURGEON:  Wyonia Hough, MD   ANESTHESIA:  Topical with tetracaine drops and 2% Xylocaine jelly, augmented with 1% preservative-free intracameral lidocaine.    COMPLICATIONS:  None.   DESCRIPTION OF PROCEDURE:  The patient was identified in the holding room and transported to the operating room and placed in the supine position under the operating microscope.  The right eye was identified as the operative eye and it was prepped and draped in the usual sterile ophthalmic fashion.   A 1 millimeter clear-corneal paracentesis was made at the 12:00 position.  0.5 ml of preservative-free 1% lidocaine was injected into the anterior chamber. The anterior chamber was filled with Viscoat viscoelastic.  A 2.4 millimeter keratome was used to make a near-clear corneal incision at the 9:00 position.  A curvilinear capsulorrhexis was made with a cystotome and capsulorrhexis forceps.  Balanced salt solution was used to hydrodissect and hydrodelineate the nucleus.   Phacoemulsification was then used in stop and chop fashion to remove the lens nucleus and epinucleus.  The remaining cortex was then removed using the irrigation and aspiration handpiece. Provisc was then placed into the capsular bag to distend it for lens placement.  A lens was then injected into the  capsular bag.  The remaining viscoelastic was aspirated.   Wounds were hydrated with balanced salt solution.  The anterior chamber was inflated to a physiologic pressure with balanced salt solution.  No wound leaks were noted. Vigamox 0.2 ml of a '1mg'$  per ml solution was injected into the anterior chamber for a dose of 0.2 mg of intracameral antibiotic at the completion of the case.   Timolol and Brimonidine drops were applied to the eye.  The patient was taken to the recovery room in stable condition without complications of anesthesia or surgery.   Lori [name] 09/13/2021, 8:36 AM

## 2021-09-13 NOTE — Anesthesia Postprocedure Evaluation (Signed)
Anesthesia Post Note  Patient: Lori [name]  Procedure(s) Performed: CATARACT EXTRACTION PHACO AND INTRAOCULAR LENS PLACEMENT (IOC) RIGHT (Right: Eye)     Patient location during evaluation: PACU Anesthesia Type: MAC Level of consciousness: awake and alert, oriented and patient cooperative Pain management: pain level controlled Vital Signs Assessment: post-procedure vital signs reviewed and stable Respiratory status: spontaneous breathing, nonlabored ventilation and respiratory function stable Cardiovascular status: blood pressure returned to baseline and stable Postop Assessment: adequate PO intake Anesthetic complications: no   No notable events documented.  Darrin Nipper

## 2021-09-14 ENCOUNTER — Encounter: Payer: Self-pay | Admitting: Ophthalmology

## 2021-09-22 ENCOUNTER — Other Ambulatory Visit: Payer: Self-pay

## 2021-09-22 ENCOUNTER — Ambulatory Visit (INDEPENDENT_AMBULATORY_CARE_PROVIDER_SITE_OTHER): Payer: Medicare Other

## 2021-09-22 ENCOUNTER — Other Ambulatory Visit: Payer: Self-pay | Admitting: Internal Medicine

## 2021-09-22 DIAGNOSIS — Z23 Encounter for immunization: Secondary | ICD-10-CM

## 2021-09-22 DIAGNOSIS — E538 Deficiency of other specified B group vitamins: Secondary | ICD-10-CM | POA: Diagnosis not present

## 2021-09-22 MED ORDER — CYANOCOBALAMIN 1000 MCG/ML IJ SOLN
1000.0000 ug | Freq: Once | INTRAMUSCULAR | Status: AC
  Administered 2021-09-22: 1000 ug via INTRAMUSCULAR

## 2021-09-22 NOTE — Progress Notes (Signed)
Patient presented for B 12 injection to left deltoid, patient voiced no concerns nor showed any signs of distress during injection. 

## 2021-10-05 ENCOUNTER — Emergency Department: Payer: Medicare Other

## 2021-10-05 ENCOUNTER — Emergency Department
Admission: EM | Admit: 2021-10-05 | Discharge: 2021-10-06 | Disposition: A | Discharge status: Home / Self Care | Payer: Medicare Other | Source: Home / Self Care | Attending: Emergency Medicine | Admitting: Emergency Medicine

## 2021-10-05 ENCOUNTER — Encounter: Payer: Self-pay | Admitting: Emergency Medicine

## 2021-10-05 ENCOUNTER — Other Ambulatory Visit: Payer: Self-pay

## 2021-10-05 DIAGNOSIS — N183 Chronic kidney disease, stage 3 unspecified: Secondary | ICD-10-CM | POA: Insufficient documentation

## 2021-10-05 DIAGNOSIS — Z79899 Other long term (current) drug therapy: Secondary | ICD-10-CM | POA: Insufficient documentation

## 2021-10-05 DIAGNOSIS — I739 Peripheral vascular disease, unspecified: Secondary | ICD-10-CM | POA: Insufficient documentation

## 2021-10-05 DIAGNOSIS — R002 Palpitations: Secondary | ICD-10-CM | POA: Insufficient documentation

## 2021-10-05 DIAGNOSIS — E039 Hypothyroidism, unspecified: Secondary | ICD-10-CM | POA: Insufficient documentation

## 2021-10-05 DIAGNOSIS — I129 Hypertensive chronic kidney disease with stage 1 through stage 4 chronic kidney disease, or unspecified chronic kidney disease: Secondary | ICD-10-CM | POA: Insufficient documentation

## 2021-10-05 DIAGNOSIS — R569 Unspecified convulsions: Secondary | ICD-10-CM

## 2021-10-05 DIAGNOSIS — R55 Syncope and collapse: Secondary | ICD-10-CM | POA: Diagnosis not present

## 2021-10-05 DIAGNOSIS — R42 Dizziness and giddiness: Secondary | ICD-10-CM | POA: Insufficient documentation

## 2021-10-05 DIAGNOSIS — I471 Supraventricular tachycardia: Secondary | ICD-10-CM | POA: Diagnosis not present

## 2021-10-05 DIAGNOSIS — K754 Autoimmune hepatitis: Secondary | ICD-10-CM | POA: Diagnosis not present

## 2021-10-05 DIAGNOSIS — G319 Degenerative disease of nervous system, unspecified: Secondary | ICD-10-CM | POA: Diagnosis not present

## 2021-10-05 DIAGNOSIS — Z20822 Contact with and (suspected) exposure to covid-19: Secondary | ICD-10-CM | POA: Diagnosis not present

## 2021-10-05 DIAGNOSIS — Z7982 Long term (current) use of aspirin: Secondary | ICD-10-CM | POA: Insufficient documentation

## 2021-10-05 LAB — CBC
HCT: 41.7 % (ref 36.0–46.0)
Hemoglobin: 14.1 g/dL (ref 12.0–15.0)
MCH: 30.8 pg (ref 26.0–34.0)
MCHC: 33.8 g/dL (ref 30.0–36.0)
MCV: 91 fL (ref 80.0–100.0)
Platelets: 227 10*3/uL (ref 150–400)
RBC: 4.58 MIL/uL (ref 3.87–5.11)
RDW: 12.6 % (ref 11.5–15.5)
WBC: 6.8 10*3/uL (ref 4.0–10.5)
nRBC: 0 % (ref 0.0–0.2)

## 2021-10-05 LAB — BASIC METABOLIC PANEL
Anion gap: 11 (ref 5–15)
BUN: 21 mg/dL (ref 8–23)
CO2: 23 mmol/L (ref 22–32)
Calcium: 10.4 mg/dL — ABNORMAL HIGH (ref 8.9–10.3)
Chloride: 102 mmol/L (ref 98–111)
Creatinine, Ser: 0.9 mg/dL (ref 0.44–1.00)
GFR, Estimated: >60 mL/min (ref >60)
Glucose, Bld: 108 mg/dL — ABNORMAL HIGH (ref 70–99)
Potassium: 4.3 mmol/L (ref 3.5–5.1)
Sodium: 136 mmol/L (ref 135–145)

## 2021-10-05 LAB — TROPONIN I (HIGH SENSITIVITY)
Troponin I (High Sensitivity): 6 ng/L (ref <18)
Troponin I (High Sensitivity): 9 ng/L (ref <18)

## 2021-10-05 MED ORDER — SODIUM CHLORIDE 0.9 % IV BOLUS
500.0000 mL | Freq: Once | INTRAVENOUS | Status: AC
  Administered 2021-10-06: 500 mL via INTRAVENOUS

## 2021-10-05 NOTE — ED Notes (Signed)
D/w Dr. Joni Fears, new order received for head CT

## 2021-10-05 NOTE — ED Provider Notes (Signed)
[facility] Emergency Department Provider Note  ____________________________________________   Event Date/Time   First MD Initiated Contact with Patient 10/05/21 2328     (approximate)  I have reviewed the triage vital signs and the nursing notes.   HISTORY  Chief Complaint Dizziness and Palpitations    HPI Lori [name] is a 87 y.o. female with medical history as listed below but who is sharp and active with no signs of dementia according to her son who is at bedside and who has not had issues with her heart nor any issues of CVA in the past.  She presents for multiple and ongoing episodes where she feels lightheaded and dizzy and off balance, like she is going to fall or pass out. She and her son also reported that she is having "seizure-like episodes" that lasts only a few seconds but during which her jaw is trembling, sometimes her hand is trembling, her eyes are deviated to the right, and she does not respond to her son.  These seem to correspond with when she is feeling palpitations in her chest and then her son tells her that she had "another episode".  This is all new to them and she has never had similar issues in the past.    The first episode was during the night last night, not quite 24 hours ago, when she was up doing some laundry because she was having trouble sleeping and then had a leak to her washing machine.  Her son came over to help her and he heard a "thunk" which was her sliding to the carpeted floor when she had the first episode of palpitations.  She also says that she felt dizzy and off balance like she could not walk correctly or maintain her balance.  She says she was continued to have episodes the whole time she was in the waiting room, but as soon as she was put in her room, the episodes stopped and now she feels better.  She currently is asymptomatic.  Nothing in particular made the symptoms better or worse.  She said that all  during the day today she was drinking Pedialyte and water because she thought maybe it was because she was little bit dehydrated.  She has not had any chest pain, abdominal pain, vomiting, nor dysuria.  She denies recent fever, sore throat, and cough.  She has had no weakness in any given arm or leg, and she has no history of stroke.  She is not on anticoagulation.  She did not sustain any injuries from her fall and has no headache nor neck pain.     Past Medical History:  Diagnosis Date   Anemia    iron deficient   Arthritis    Autoimmune hepatitis ([place]) 05/10/2016   Hep C   B12 deficiency    Barrett esophagus 01/2016   Bradycardia    Chronic kidney disease    stage III   Diverticulosis    GERD (gastroesophageal reflux disease)    Hypertension    Hypothyroidism    Ulcer disease     Patient Active Problem List   Diagnosis Date Noted   Numbness in feet 06/18/2021   CKD (chronic kidney disease) 06/12/2021   Dizziness 04/10/2021   Chest tightness 02/07/2021   Diarrhea 10/09/2020   Abdominal discomfort 09/27/2019   Gall bladder polyp 05/07/2018   Breast tenderness 05/07/2018   Hyperglycemia 09/10/2016   Fullness of breast 09/10/2016   Autoimmune hepatitis ([place]) 05/10/2016  MGUS (monoclonal gammopathy of unknown significance) 06/06/2015   LUQ fullness 06/06/2015   Health care maintenance 06/06/2015   Fatty tumor 03/07/2015   BMI 32.0-32.9,adult 01/02/2015   Abnormal liver function tests 01/02/2015   Hand cramps 11/15/2014   Leg cramps 06/13/2014   Osteopenia 03/02/2013   Hyperbilirubinemia 03/02/2013   Hypertension 12/22/2012   Gastroparesis 12/22/2012   Barrett's esophagus with esophagitis 12/22/2012   Hypothyroidism 12/22/2012   Anemia 12/22/2012   B12 deficiency 12/22/2012   CKD (chronic kidney disease) stage 3, GFR 30-59 ml/min ([place]) 12/22/2012    Past Surgical History:  Procedure Laterality Date   CATARACT EXTRACTION W/PHACO Left 08/30/2021   Procedure:  CATARACT EXTRACTION PHACO AND INTRAOCULAR LENS PLACEMENT ([place]) LEFT 8.21 01:00.3;  Surgeon: Leandrew Koyanagi, MD;  Location: [place];  Service: Ophthalmology;  Laterality: Left;   CATARACT EXTRACTION W/PHACO Right 09/13/2021   Procedure: CATARACT EXTRACTION PHACO AND INTRAOCULAR LENS PLACEMENT ([place]) RIGHT;  Surgeon: Leandrew Koyanagi, MD;  Location: [place];  Service: Ophthalmology;  Laterality: Right;  8.11 00:59.8   CHOLECYSTECTOMY N/A 06/25/2018   Procedure: LAPAROSCOPIC CHOLECYSTECTOMY WITH INTRAOPERATIVE CHOLANGIOGRAM;  Surgeon: Robert Bellow, MD;  Location: [facility];  Service: General;  Laterality: N/A;   ESOPHAGOGASTRODUODENOSCOPY (EGD) WITH PROPOFOL N/A 01/30/2016   Procedure: ESOPHAGOGASTRODUODENOSCOPY (EGD) WITH PROPOFOL;  Surgeon: Lollie Sails, MD;  Location: [facility] ENDOSCOPY;  Service: Endoscopy;  Laterality: N/A;   ESOPHAGOGASTRODUODENOSCOPY (EGD) WITH PROPOFOL N/A 01/14/2018   Procedure: ESOPHAGOGASTRODUODENOSCOPY (EGD) WITH PROPOFOL;  Surgeon: Lollie Sails, MD;  Location: [facility] ENDOSCOPY;  Service: Endoscopy;  Laterality: N/A;   ESOPHAGOGASTRODUODENOSCOPY (EGD) WITH PROPOFOL N/A 05/20/2018   Procedure: ESOPHAGOGASTRODUODENOSCOPY (EGD) WITH PROPOFOL;  Surgeon: Lollie Sails, MD;  Location: [facility] ENDOSCOPY;  Service: Endoscopy;  Laterality: N/A;   ESOPHAGOGASTRODUODENOSCOPY (EGD) WITH PROPOFOL N/A 01/13/2019   Procedure: ESOPHAGOGASTRODUODENOSCOPY (EGD) WITH PROPOFOL;  Surgeon: Lollie Sails, MD;  Location: [facility] ENDOSCOPY;  Service: Endoscopy;  Laterality: N/A;   HERNIA REPAIR     lap hiatal hernia repair  07/08/08   LEFT OOPHORECTOMY  1993   benign ovarian cyst   TUBAL LIGATION  1981    Prior to Admission medications   Medication Sig Start Date End Date Taking? Authorizing Provider  zolpidem (AMBIEN CR) 6.25 MG CR tablet TAKE 1 TABLET BY MOUTH EVERY DAY AT BEDTIME AS NEEDED 09/24/21   Einar Pheasant, MD  aspirin 81 MG tablet Take 81  mg by mouth daily.    [provider]  benazepril (LOTENSIN) 40 MG tablet Take 1 tablet (40 mg total) by mouth daily. 06/12/21   Einar Pheasant, MD  calcitonin, salmon, (MIACALCIN/FORTICAL) 200 UNIT/ACT nasal spray One spray alternating nostrils qod 04/06/21   Einar Pheasant, MD  Calcium-Phosphorus-Vitamin D (CITRACAL +D3 PO) Take 1 tablet by mouth daily.    [provider]  chlorzoxazone (PARAFON) 500 MG tablet TAKE 1 TABLET BY MOUTH EVERY DAY AT BEDTIME AS NEEDED.  Do not take with ambien. 06/12/21   Einar Pheasant, MD  Cholecalciferol (D3-1000 PO) Take 1,000 Units by mouth daily.  Patient not taking: Reported on 08/17/2021    [provider]  Cyanocobalamin (B-12) 1000 MCG/ML KIT Inject 1,000 mcg as directed every 30 (thirty) days.     [provider]  GLUCOSAMINE-CHONDROITIN PO Take 1 tablet by mouth 2 (two) times daily.     [provider]  levothyroxine (SYNTHROID) 88 MCG tablet TAKE 1 TABLET BY MOUTH EVERY DAY 08/21/21   Einar Pheasant, MD  Magnesium Oxide 400 (240  Mg) MG TABS Take 1 tablet (400 mg total) by mouth 2 (two) times daily. 09/24/19   Einar Pheasant, MD  Mesalamine (ASACOL) 400 MG CPDR DR capsule Take 400 mg by mouth 2 (two) times daily.     [provider]  pantoprazole (PROTONIX) 40 MG tablet Take 40 mg by mouth 2 (two) times daily.     [provider]  Wheat Dextrin (BENEFIBER DRINK MIX) PACK Take 1 each by mouth daily.     [provider]    Allergies Penicillins, Daypro [oxaprozin], and Lodine [etodolac]  Family History  Problem Relation Age of Onset   Stroke Mother    Breast cancer Neg Hx    Colon cancer Neg Hx     Social History Social History   Tobacco Use   Smoking status: Never   Smokeless tobacco: Never  Vaping Use   Vaping Use: Never used  Substance Use Topics   Alcohol use: No    Alcohol/week: 0.0 standard drinks   Drug use: No    Review of Systems Constitutional: No  fever/chills Eyes: No visual changes. ENT: No sore throat. Cardiovascular: Positive for episodes of palpitations and syncope versus near syncope.  Denies chest pain. Respiratory: Denies shortness of breath. Gastrointestinal: No abdominal pain.  No nausea, no vomiting.  No diarrhea.  No constipation. Genitourinary: Negative for dysuria. Musculoskeletal: Negative for neck pain.  Negative for back pain. Integumentary: Negative for rash. Neurological: Episodes of seizure-like activity and episodes of dizziness and feeling off balance.  Negative for headaches, focal weakness or numbness.   ____________________________________________   PHYSICAL EXAM:  VITAL SIGNS: ED Triage Vitals  Enc Vitals Group     BP 10/05/21 1938 (!) 195/78     Pulse Rate 10/05/21 1938 (!) 102     Resp 10/05/21 1938 20     Temp 10/05/21 1939 (!) 97.5 F (36.4 C)     Temp Source 10/05/21 1939 Oral     SpO2 10/05/21 1938 99 %     Weight 10/05/21 1945 67.1 kg (148 lb)     Height 10/05/21 1945 1.524 m (5')     Head Circumference --      Peak Flow --      Pain Score 10/05/21 1945 0     Pain Loc --      Pain Edu? --      Excl. in [place]? --     Constitutional: Alert and oriented.  Eyes: Conjunctivae are normal.  Head: Atraumatic. Nose: No congestion/rhinnorhea. Mouth/Throat: Patient is wearing a mask. Neck: No stridor.  No meningeal signs.   Cardiovascular: Normal rate, regular rhythm. Good peripheral circulation. Respiratory: Normal respiratory effort.  No retractions. Gastrointestinal: Soft and nontender. No distention.  Musculoskeletal: No lower extremity tenderness nor edema. No gross deformities of extremities. Neurologic:  Normal speech and language. No gross focal neurologic deficits are appreciated.  She has good bilateral grip strength, normal major muscle groups strength in upper and lower extremities, and no dysmetria on finger-to-nose testing.  I observed no seizure-like activity during my history  and physical. Skin:  Skin is warm, dry and intact. Psychiatric: Mood and affect are normal. Speech and behavior are normal.  ____________________________________________   LABS (all labs ordered are listed, but only abnormal results are displayed)  Labs Reviewed  BASIC METABOLIC PANEL - Abnormal; Notable for the following components:      Result Value   Glucose, Bld 108 (*)    Calcium 10.4 (*)    All  other components within normal limits  URINALYSIS, ROUTINE W REFLEX MICROSCOPIC - Abnormal; Notable for the following components:   Color, Urine YELLOW (*)    APPearance CLOUDY (*)    pH 9.0 (*)    Hgb urine dipstick SMALL (*)    Ketones, ur 5 (*)    Protein, ur 30 (*)    All other components within normal limits  CBC  TROPONIN I (HIGH SENSITIVITY)  TROPONIN I (HIGH SENSITIVITY)   ____________________________________________  EKG  ED ECG REPORT I, Hinda Kehr, the attending physician, personally viewed and interpreted this ECG.  Date: 10/05/2021 EKG Time: 19: 33 Rate: 109 Rhythm: Sinus tachycardia QRS Axis: Left axis deviation Intervals: normal ST/T Wave abnormalities: Non-specific ST segment / T-wave changes, but no clear evidence of acute ischemia. Narrative Interpretation: no definitive evidence of acute ischemia; does not meet STEMI criteria.  ED ECG REPORT I, Hinda Kehr, the attending physician, personally viewed and interpreted this ECG.  Date: 10/05/2021 EKG Time: 20:34 (labeled by nurse as "Post-episode", but I suspect is actually "during episode" based on EKG timing) Rate: 101 Rhythm: Borderline sinus tachycardia QRS Axis: normal Intervals: normal ST/T Wave abnormalities: Non-specific ST segment / T-wave changes, but no clear evidence of acute ischemia. Narrative Interpretation: no definitive evidence of acute ischemia; does not meet STEMI criteria.  ED ECG REPORT I, Hinda Kehr, the attending physician, personally viewed and interpreted this  ECG.  Date: 10/05/2021 EKG Time: 20: 35 (labeled by nurse as "during episode", but I suspect is actually "post-episode" based on EKG timing) Rate: 109 Rhythm: Sinus tachycardia QRS Axis: normal Intervals: normal ST/T Wave abnormalities: Non-specific ST segment / T-wave changes, but no clear evidence of acute ischemia. Narrative Interpretation: no definitive evidence of acute ischemia; does not meet STEMI criteria.   ____________________________________________  RADIOLOGY I, Hinda Kehr, personally viewed and evaluated these images (plain radiographs) as part of my medical decision making, as well as reviewing the written report by the radiologist.  ED MD interpretation: No acute abnormalities identified on chest x-ray nor head CT.  MRI and MRA of the brain/head show no acute abnormalities.  Official radiology report(s): DG Chest 2 View  Result Date: 10/05/2021 CLINICAL DATA:  Palpitations EXAM: CHEST - 2 VIEW COMPARISON:  None. FINDINGS: Lungs are well expanded, symmetric, and clear. No pneumothorax or pleural effusion. Cardiac size within normal limits. Pulmonary vascularity is normal. Osseous structures are age-appropriate. No acute bone abnormality. IMPRESSION: No active cardiopulmonary disease. Electronically Signed   By: Fidela Salisbury M.D.   On: 10/05/2021 20:26   CT HEAD WO CONTRAST (5MM)  Result Date: 10/05/2021 CLINICAL DATA:  Dizziness and history of recent fall, initial encounter EXAM: CT HEAD WITHOUT CONTRAST TECHNIQUE: Contiguous axial images were obtained from the base of the skull through the vertex without intravenous contrast. COMPARISON:  None. FINDINGS: Brain: No evidence of acute infarction, hemorrhage, hydrocephalus, extra-axial collection or mass lesion/mass effect. Mild atrophic changes are noted. Vascular: No hyperdense vessel or unexpected calcification. Skull: Normal. Negative for fracture or focal lesion. Sinuses/Orbits: No acute finding. Other: None. IMPRESSION:  Mild atrophic changes without acute intracranial abnormality. Electronically Signed   By: Inez Catalina M.D.   On: 10/05/2021 21:27   MR ANGIO HEAD WO CONTRAST  Result Date: 10/06/2021 CLINICAL DATA:  Initial evaluation for acute ataxia, vertigo. EXAM: MRI HEAD WITHOUT CONTRAST MRA HEAD WITHOUT CONTRAST TECHNIQUE: Multiplanar, multi-echo pulse sequences of the brain and surrounding structures were acquired without intravenous contrast. Angiographic images of the Circle of Willis  were acquired using MRA technique without intravenous contrast. COMPARISON:  CT from 10/05/2021. FINDINGS: MRI HEAD FINDINGS Brain: Moderately advanced cerebral atrophy with mild chronic small vessel ischemic disease. No abnormal foci of restricted diffusion to suggest acute or subacute ischemia. Gray-white matter differentiation maintained. No encephalomalacia to suggest chronic cortical infarction. No evidence for acute or chronic intracranial hemorrhage. No mass lesion, midline shift or mass effect. No hydrocephalus or extra-axial fluid collection. Pituitary gland suprasellar region normal. No intrinsic temporal lobe abnormality. Midline structures intact. Vascular: Major intracranial vascular flow voids are maintained. Skull and upper cervical spine: Craniocervical junction within normal limits. Bone marrow signal intensity normal. Hyperostosis frontalis interna noted. No scalp soft tissue abnormality. Sinuses/Orbits: Prior bilateral ocular lens replacement with axial myopia. Paranasal sinuses are largely clear. No mastoid effusion. Inner ear structures grossly normal. Other: None. MRA HEAD FINDINGS Anterior circulation: Visualized cervical segments of the internal carotid arteries are widely patent with antegrade flow. Petrous, cavernous, and supraclinoid segments widely patent without stenosis. A1 segments patent bilaterally. Normal anterior communicating artery complex. Anterior cerebral arteries patent to their distal aspects  without stenosis. No M1 stenosis or occlusion. Normal MCA bifurcations. Distal MCA branches well perfused and symmetric. Posterior circulation: Visualized vertebral arteries patent without stenosis. Left vertebral artery dominant. Neither PICA origin well visualized. Basilar mildly diminutive but patent to its distal aspect. Superior cerebellar arteries are patent bilaterally. Fetal type origin of both PCAs. PCAs remain patent to their distal aspects without stenosis. Anatomic variants: Fetal type origin of the PCAs.  No aneurysm. IMPRESSION: MRI HEAD IMPRESSION: 1. No acute intracranial abnormality. 2. Moderately advanced cerebral atrophy with mild chronic small vessel ischemic disease. MRA HEAD IMPRESSION: Negative intracranial MRA. No large vessel occlusion, hemodynamically significant stenosis, or other acute vascular abnormality. Electronically Signed   By: Jeannine Boga M.D.   On: 10/06/2021 01:41   MR BRAIN WO CONTRAST  Result Date: 10/06/2021 CLINICAL DATA:  Initial evaluation for acute ataxia, vertigo. EXAM: MRI HEAD WITHOUT CONTRAST MRA HEAD WITHOUT CONTRAST TECHNIQUE: Multiplanar, multi-echo pulse sequences of the brain and surrounding structures were acquired without intravenous contrast. Angiographic images of the Circle of Willis were acquired using MRA technique without intravenous contrast. COMPARISON:  CT from 10/05/2021. FINDINGS: MRI HEAD FINDINGS Brain: Moderately advanced cerebral atrophy with mild chronic small vessel ischemic disease. No abnormal foci of restricted diffusion to suggest acute or subacute ischemia. Gray-white matter differentiation maintained. No encephalomalacia to suggest chronic cortical infarction. No evidence for acute or chronic intracranial hemorrhage. No mass lesion, midline shift or mass effect. No hydrocephalus or extra-axial fluid collection. Pituitary gland suprasellar region normal. No intrinsic temporal lobe abnormality. Midline structures intact.  Vascular: Major intracranial vascular flow voids are maintained. Skull and upper cervical spine: Craniocervical junction within normal limits. Bone marrow signal intensity normal. Hyperostosis frontalis interna noted. No scalp soft tissue abnormality. Sinuses/Orbits: Prior bilateral ocular lens replacement with axial myopia. Paranasal sinuses are largely clear. No mastoid effusion. Inner ear structures grossly normal. Other: None. MRA HEAD FINDINGS Anterior circulation: Visualized cervical segments of the internal carotid arteries are widely patent with antegrade flow. Petrous, cavernous, and supraclinoid segments widely patent without stenosis. A1 segments patent bilaterally. Normal anterior communicating artery complex. Anterior cerebral arteries patent to their distal aspects without stenosis. No M1 stenosis or occlusion. Normal MCA bifurcations. Distal MCA branches well perfused and symmetric. Posterior circulation: Visualized vertebral arteries patent without stenosis. Left vertebral artery dominant. Neither PICA origin well visualized. Basilar mildly diminutive but patent to its distal aspect.  Superior cerebellar arteries are patent bilaterally. Fetal type origin of both PCAs. PCAs remain patent to their distal aspects without stenosis. Anatomic variants: Fetal type origin of the PCAs.  No aneurysm. IMPRESSION: MRI HEAD IMPRESSION: 1. No acute intracranial abnormality. 2. Moderately advanced cerebral atrophy with mild chronic small vessel ischemic disease. MRA HEAD IMPRESSION: Negative intracranial MRA. No large vessel occlusion, hemodynamically significant stenosis, or other acute vascular abnormality. Electronically Signed   By: Jeannine Boga M.D.   On: 10/06/2021 01:41    ____________________________________________   PROCEDURES   Procedure(s) performed (including Critical Care):  .1-3 Lead EKG Interpretation Performed by: Hinda Kehr, MD Authorized by: Hinda Kehr, MD      Interpretation: normal     ECG rate:  75   ECG rate assessment: normal     Rhythm: sinus rhythm     Ectopy: none     Conduction: normal     ____________________________________________   INITIAL IMPRESSION / MDM / ASSESSMENT AND PLAN / ED COURSE  As part of my medical decision making, I reviewed the following data within the electronic medicHistory obtained from family, Nursing notes reviewed and incorporated, Labs reviewed , EKG interpreted , Old chart reviewed, and Notes from prior ED visitsal record:     Differential diagnosis includes, but is not limited to, nonspecific cardiac arrhythmia leading to transient episodes of near syncope and vasovagal like episodes, potentially dangerous cardiac dysrhythmias, CVA/TIA, electrolyte or metabolic abnormality, acute infection such as UTI.  The patient is on the cardiac monitor to evaluate for evidence of arrhythmia and/or significant heart rate changes.  Vital signs are stable and reassuring.  During her "episodes" she seems to have some mild tachycardia but it has consistently been in sinus rhythm.   High-sensitivity troponin is within normal limits x2.  Basic metabolic panel is reassuring with no renal or electrolyte abnormality. I personally reviewed the patient's imaging and agree with the radiologist's interpretation that there are no acute abnormalities on chest x-ray, and she is having no chest pain nor respiratory/pulmonary symptoms.  Head CT shows no sign of acute abnormality.  Physical exam including neuro exam is reassuring.  However, I have no explanation for the constellation of symptoms described by the patient and her son and why she would continue to have these episodes even though they do not seem to be happening when she got to her exam room.  Given her age and the description of the symptoms, I will proceed with MRI/A of the brain and head to rule out CVA and to look for evidence of posterior circulation disruption or abnormality.   I discussed this with the patient and her son and they are in agreement with the plan.  I will then reassess to see if she is having any additional episodes of the symptoms.  I have also ordered normal saline 500 mL bolus in case this is a volume issue.     Clinical Course as of 10/06/21 0341  Fri Oct 06, 2021  0124 Urinalysis, Routine w reflex microscopic Urine, Clean Catch(!) There is a bit of microscopic hematuria but no evidence of acute infection on the urinalysis. [CF]    Clinical Course User Index [CF] Hinda Kehr, MD     ____________________________________________  FINAL CLINICAL IMPRESSION(S) / ED DIAGNOSES  Final diagnoses:  Palpitations  Seizure-like activity ([place])     MEDICATIONS GIVEN DURING THIS VISIT:  Medications  sodium chloride 0.9 % bolus 500 mL (0 mLs Intravenous Stopped 10/06/21 0311)  ED Discharge Orders     None        Note:  This document was prepared using Dragon voice recognition software and may include unintentional dictation errors.   Hinda Kehr, MD 10/06/21 [phone]

## 2021-10-05 NOTE — ED Notes (Signed)
This RN to bedside, introduced self to patient and son, pt placed on cardiac monitor at this time. Pt states is feeling better than she was earlier. Pt denies any needs at this time, call bell placed within reach of patient at this time.

## 2021-10-05 NOTE — ED Triage Notes (Signed)
Pt to ED from home c/o dizziness and palpitations.  States fell this morning from dizziness around 0300 and then had more spells throughout the day.  Denies hitting head or LOC, takes baby ASA daily.  Episodes are very brief and intermittent.  Denies pain or SOB.  Denies cardiac hx.  A&Ox4, chest rise even and unlabored, in NAD at this time.

## 2021-10-05 NOTE — ED Notes (Signed)
This RN notified by registration staff patient's son reports possible seizures, this RN to lobby to assess patient. Pt's son reports intermittent "episodes" of patient tilting her head back last lasting 2-3 seconds where he has to repeatedly call out to her to get her to answer, pt pulled back to triage 2, repeat EKG obtained, while patient remains on EKG monitor, pt c/o feeling another "spell coming on", EKG obtained during "spell". Pt c/o feeling "trembly and shakey", initially denies CP, then c/o chest tightness. Pt noted initially to have HR 98, during "spell" HR noted to increase to 110. Pt's care discussed with EDP Joni Fears at this time and repeat EKG's reviewed by Dr. Joni Fears.

## 2021-10-05 NOTE — ED Triage Notes (Signed)
FIRST NURSE NOTE:  Pt here with family states she had a fall this morning, was dizzy when she fell. Pt states she feels like her heart is racing and still feels dizzy. Pt taken out of line in lobby and stat registered by this RN and taken to triage for EKG.

## 2021-10-06 ENCOUNTER — Other Ambulatory Visit: Payer: Self-pay

## 2021-10-06 ENCOUNTER — Emergency Department: Payer: Medicare Other

## 2021-10-06 ENCOUNTER — Inpatient Hospital Stay
Admission: EM | Admit: 2021-10-06 | Discharge: 2021-10-08 | DRG: 310 | Disposition: A | Discharge status: Home / Self Care | Payer: Medicare Other | Attending: Internal Medicine | Admitting: Internal Medicine

## 2021-10-06 DIAGNOSIS — E039 Hypothyroidism, unspecified: Secondary | ICD-10-CM | POA: Diagnosis present

## 2021-10-06 DIAGNOSIS — I959 Hypotension, unspecified: Secondary | ICD-10-CM | POA: Diagnosis not present

## 2021-10-06 DIAGNOSIS — E785 Hyperlipidemia, unspecified: Secondary | ICD-10-CM | POA: Diagnosis present

## 2021-10-06 DIAGNOSIS — M1990 Unspecified osteoarthritis, unspecified site: Secondary | ICD-10-CM | POA: Diagnosis present

## 2021-10-06 DIAGNOSIS — Z888 Allergy status to other drugs, medicaments and biological substances status: Secondary | ICD-10-CM

## 2021-10-06 DIAGNOSIS — Z79899 Other long term (current) drug therapy: Secondary | ICD-10-CM | POA: Diagnosis not present

## 2021-10-06 DIAGNOSIS — I471 Supraventricular tachycardia, unspecified: Secondary | ICD-10-CM

## 2021-10-06 DIAGNOSIS — Z7982 Long term (current) use of aspirin: Secondary | ICD-10-CM

## 2021-10-06 DIAGNOSIS — K5790 Diverticulosis of intestine, part unspecified, without perforation or abscess without bleeding: Secondary | ICD-10-CM | POA: Diagnosis present

## 2021-10-06 DIAGNOSIS — K2270 Barrett's esophagus without dysplasia: Secondary | ICD-10-CM | POA: Diagnosis present

## 2021-10-06 DIAGNOSIS — R002 Palpitations: Secondary | ICD-10-CM | POA: Diagnosis not present

## 2021-10-06 DIAGNOSIS — I129 Hypertensive chronic kidney disease with stage 1 through stage 4 chronic kidney disease, or unspecified chronic kidney disease: Secondary | ICD-10-CM | POA: Diagnosis present

## 2021-10-06 DIAGNOSIS — K529 Noninfective gastroenteritis and colitis, unspecified: Secondary | ICD-10-CM | POA: Diagnosis present

## 2021-10-06 DIAGNOSIS — K754 Autoimmune hepatitis: Secondary | ICD-10-CM | POA: Diagnosis present

## 2021-10-06 DIAGNOSIS — I10 Essential (primary) hypertension: Secondary | ICD-10-CM

## 2021-10-06 DIAGNOSIS — Z9851 Tubal ligation status: Secondary | ICD-10-CM | POA: Diagnosis not present

## 2021-10-06 DIAGNOSIS — R55 Syncope and collapse: Secondary | ICD-10-CM

## 2021-10-06 DIAGNOSIS — K219 Gastro-esophageal reflux disease without esophagitis: Secondary | ICD-10-CM | POA: Diagnosis present

## 2021-10-06 DIAGNOSIS — Z79890 Hormone replacement therapy (postmenopausal): Secondary | ICD-10-CM | POA: Diagnosis not present

## 2021-10-06 DIAGNOSIS — Z20822 Contact with and (suspected) exposure to covid-19: Secondary | ICD-10-CM | POA: Diagnosis present

## 2021-10-06 DIAGNOSIS — N183 Chronic kidney disease, stage 3 unspecified: Secondary | ICD-10-CM | POA: Diagnosis present

## 2021-10-06 DIAGNOSIS — E538 Deficiency of other specified B group vitamins: Secondary | ICD-10-CM | POA: Diagnosis present

## 2021-10-06 DIAGNOSIS — Z8711 Personal history of peptic ulcer disease: Secondary | ICD-10-CM

## 2021-10-06 DIAGNOSIS — R000 Tachycardia, unspecified: Secondary | ICD-10-CM | POA: Diagnosis not present

## 2021-10-06 DIAGNOSIS — G319 Degenerative disease of nervous system, unspecified: Secondary | ICD-10-CM | POA: Diagnosis not present

## 2021-10-06 DIAGNOSIS — R42 Dizziness and giddiness: Secondary | ICD-10-CM | POA: Diagnosis not present

## 2021-10-06 DIAGNOSIS — Z880 Allergy status to penicillin: Secondary | ICD-10-CM

## 2021-10-06 LAB — URINALYSIS, ROUTINE W REFLEX MICROSCOPIC
Bacteria, UA: NONE SEEN
Bilirubin Urine: NEGATIVE
Glucose, UA: NEGATIVE mg/dL
Ketones, ur: 5 mg/dL — AB
Leukocytes,Ua: NEGATIVE
Nitrite: NEGATIVE
Protein, ur: 30 mg/dL — AB
Specific Gravity, Urine: 1.01 (ref 1.005–1.030)
WBC, UA: NONE SEEN WBC/hpf (ref 0–5)
pH: 9 — ABNORMAL HIGH (ref 5.0–8.0)

## 2021-10-06 LAB — RESP PANEL BY RT-PCR (FLU A&B, COVID) ARPGX2
Influenza A by PCR: NEGATIVE
Influenza B by PCR: NEGATIVE
SARS Coronavirus 2 by RT PCR: NEGATIVE

## 2021-10-06 LAB — COMPREHENSIVE METABOLIC PANEL
ALT: 15 U/L (ref 0–44)
AST: 25 U/L (ref 15–41)
Albumin: 4 g/dL (ref 3.5–5.0)
Alkaline Phosphatase: 52 U/L (ref 38–126)
Anion gap: 14 (ref 5–15)
BUN: 20 mg/dL (ref 8–23)
CO2: 19 mmol/L — ABNORMAL LOW (ref 22–32)
Calcium: 9.6 mg/dL (ref 8.9–10.3)
Chloride: 102 mmol/L (ref 98–111)
Creatinine, Ser: 1.01 mg/dL — ABNORMAL HIGH (ref 0.44–1.00)
GFR, Estimated: 55 mL/min — ABNORMAL LOW (ref >60)
Glucose, Bld: 102 mg/dL — ABNORMAL HIGH (ref 70–99)
Potassium: 3.6 mmol/L (ref 3.5–5.1)
Sodium: 135 mmol/L (ref 135–145)
Total Bilirubin: 2.1 mg/dL — ABNORMAL HIGH (ref 0.3–1.2)
Total Protein: 7.9 g/dL (ref 6.5–8.1)

## 2021-10-06 LAB — CBC WITH DIFFERENTIAL/PLATELET
Abs Immature Granulocytes: 0.02 10*3/uL (ref 0.00–0.07)
Basophils Absolute: 0.1 10*3/uL (ref 0.0–0.1)
Basophils Relative: 1 %
Eosinophils Absolute: 0.1 10*3/uL (ref 0.0–0.5)
Eosinophils Relative: 1 %
HCT: 37.2 % (ref 36.0–46.0)
Hemoglobin: 12.7 g/dL (ref 12.0–15.0)
Immature Granulocytes: 0 %
Lymphocytes Relative: 18 %
Lymphs Abs: 1.2 10*3/uL (ref 0.7–4.0)
MCH: 30.8 pg (ref 26.0–34.0)
MCHC: 34.1 g/dL (ref 30.0–36.0)
MCV: 90.1 fL (ref 80.0–100.0)
Monocytes Absolute: 0.7 10*3/uL (ref 0.1–1.0)
Monocytes Relative: 11 %
Neutro Abs: 4.5 10*3/uL (ref 1.7–7.7)
Neutrophils Relative %: 69 %
Platelets: 258 10*3/uL (ref 150–400)
RBC: 4.13 MIL/uL (ref 3.87–5.11)
RDW: 12.5 % (ref 11.5–15.5)
WBC: 6.5 10*3/uL (ref 4.0–10.5)
nRBC: 0 % (ref 0.0–0.2)

## 2021-10-06 LAB — TSH: TSH: 2.464 u[IU]/mL (ref 0.350–4.500)

## 2021-10-06 LAB — TROPONIN I (HIGH SENSITIVITY)
Troponin I (High Sensitivity): 8 ng/L (ref <18)
Troponin I (High Sensitivity): 9 ng/L (ref <18)

## 2021-10-06 LAB — MAGNESIUM: Magnesium: 1.7 mg/dL (ref 1.7–2.4)

## 2021-10-06 MED ORDER — ONDANSETRON HCL 4 MG PO TABS: 4.0000 mg | ORAL_TABLET | Freq: Four times a day (QID) | ORAL | Status: DC | PRN

## 2021-10-06 MED ORDER — PANTOPRAZOLE SODIUM 40 MG PO TBEC
40.0000 mg | DELAYED_RELEASE_TABLET | Freq: Two times a day (BID) | ORAL | Status: DC
  Administered 2021-10-06 – 2021-10-08 (×3): 40 mg via ORAL
  Filled 2021-10-06 (×4): qty 1

## 2021-10-06 MED ORDER — CALCITONIN (SALMON) 200 UNIT/ACT NA SOLN
1.0000 | Freq: Every day | NASAL | Status: DC
  Administered 2021-10-07 – 2021-10-08 (×2): 1 via NASAL
  Filled 2021-10-06: qty 3.7

## 2021-10-06 MED ORDER — DILTIAZEM HCL-DEXTROSE 125-5 MG/125ML-% IV SOLN (PREMIX)
5.0000 mg/h | INTRAVENOUS | Status: DC
Start: 2021-10-06 — End: 2021-10-07
  Administered 2021-10-06: 5 mg/h via INTRAVENOUS
  Filled 2021-10-06: qty 125

## 2021-10-06 MED ORDER — NUTRISOURCE FIBER PO PACK
1.0000 | PACK | Freq: Every day | ORAL | Status: DC
  Administered 2021-10-07: 1 via ORAL
  Filled 2021-10-06 (×3): qty 1

## 2021-10-06 MED ORDER — MAGNESIUM OXIDE -MG SUPPLEMENT 400 (240 MG) MG PO TABS
400.0000 mg | ORAL_TABLET | Freq: Two times a day (BID) | ORAL | Status: DC
  Administered 2021-10-06 – 2021-10-08 (×4): 400 mg via ORAL
  Filled 2021-10-06 (×4): qty 1

## 2021-10-06 MED ORDER — CALCIUM CARBONATE-VITAMIN D 500-200 MG-UNIT PO TABS
ORAL_TABLET | Freq: Every day | ORAL | Status: DC
  Filled 2021-10-06: qty 1

## 2021-10-06 MED ORDER — CHLORZOXAZONE 500 MG PO TABS
500.0000 mg | ORAL_TABLET | Freq: Every evening | ORAL | Status: DC | PRN
  Filled 2021-10-06: qty 1

## 2021-10-06 MED ORDER — ZOLPIDEM TARTRATE 5 MG PO TABS
5.0000 mg | ORAL_TABLET | Freq: Every evening | ORAL | Status: DC | PRN
  Filled 2021-10-06: qty 1

## 2021-10-06 MED ORDER — METOPROLOL TARTRATE 25 MG PO TABS
25.0000 mg | ORAL_TABLET | Freq: Once | ORAL | Status: AC
  Administered 2021-10-06: 25 mg via ORAL
  Filled 2021-10-06: qty 1

## 2021-10-06 MED ORDER — MESALAMINE 400 MG PO CPDR
400.0000 mg | DELAYED_RELEASE_CAPSULE | Freq: Two times a day (BID) | ORAL | Status: DC
  Administered 2021-10-06 – 2021-10-08 (×3): 400 mg via ORAL
  Filled 2021-10-06 (×5): qty 1

## 2021-10-06 MED ORDER — ACETAMINOPHEN 325 MG PO TABS: 650.0000 mg | ORAL_TABLET | Freq: Four times a day (QID) | ORAL | Status: DC | PRN

## 2021-10-06 MED ORDER — ONDANSETRON HCL 4 MG/2ML IJ SOLN: 4.0000 mg | Freq: Four times a day (QID) | INTRAMUSCULAR | Status: DC | PRN

## 2021-10-06 MED ORDER — BENAZEPRIL HCL 20 MG PO TABS
40.0000 mg | ORAL_TABLET | Freq: Every day | ORAL | Status: DC
  Administered 2021-10-07 – 2021-10-08 (×2): 40 mg via ORAL
  Filled 2021-10-06 (×2): qty 2

## 2021-10-06 MED ORDER — SODIUM CHLORIDE 0.9 % IV BOLUS
500.0000 mL | Freq: Once | INTRAVENOUS | Status: AC
  Administered 2021-10-06: 500 mL via INTRAVENOUS

## 2021-10-06 MED ORDER — ENOXAPARIN SODIUM 40 MG/0.4ML IJ SOSY
40.0000 mg | PREFILLED_SYRINGE | INTRAMUSCULAR | Status: DC
  Administered 2021-10-06 – 2021-10-07 (×2): 40 mg via SUBCUTANEOUS
  Filled 2021-10-06 (×2): qty 0.4

## 2021-10-06 MED ORDER — CALCIUM CARBONATE-VITAMIN D 500-200 MG-UNIT PO TABS
1.0000 | ORAL_TABLET | Freq: Every day | ORAL | Status: DC
  Administered 2021-10-07 – 2021-10-08 (×2): 1 via ORAL
  Filled 2021-10-06 (×2): qty 1

## 2021-10-06 MED ORDER — LEVOTHYROXINE SODIUM 88 MCG PO TABS
88.0000 ug | ORAL_TABLET | Freq: Every day | ORAL | Status: DC
  Administered 2021-10-07 – 2021-10-08 (×2): 88 ug via ORAL
  Filled 2021-10-06 (×2): qty 1

## 2021-10-06 MED ORDER — ACETAMINOPHEN 650 MG RE SUPP
650.0000 mg | Freq: Four times a day (QID) | RECTAL | Status: DC | PRN
  Filled 2021-10-06: qty 1

## 2021-10-06 MED ORDER — CYANOCOBALAMIN 1000 MCG/ML IJ SOLN
1000.0000 ug | INTRAMUSCULAR | Status: DC
  Administered 2021-10-07: 1000 ug via INTRAMUSCULAR
  Filled 2021-10-06: qty 1

## 2021-10-06 MED ORDER — MAGNESIUM SULFATE 2 GM/50ML IV SOLN
2.0000 g | Freq: Once | INTRAVENOUS | Status: AC
  Administered 2021-10-06: 2 g via INTRAVENOUS
  Filled 2021-10-06: qty 50

## 2021-10-06 MED ORDER — TRAZODONE HCL 50 MG PO TABS: 25.0000 mg | ORAL_TABLET | Freq: Every evening | ORAL | Status: DC | PRN

## 2021-10-06 MED ORDER — POTASSIUM CHLORIDE IN NACL 20-0.9 MEQ/L-% IV SOLN
INTRAVENOUS | Status: DC
  Filled 2021-10-06: qty 1000

## 2021-10-06 MED ORDER — MAGNESIUM HYDROXIDE 400 MG/5ML PO SUSP: 30.0000 mL | Freq: Every day | ORAL | Status: DC | PRN

## 2021-10-06 MED ORDER — ASPIRIN EC 81 MG PO TBEC
81.0000 mg | DELAYED_RELEASE_TABLET | Freq: Every day | ORAL | Status: DC
  Administered 2021-10-06 – 2021-10-07 (×2): 81 mg via ORAL
  Filled 2021-10-06 (×3): qty 1

## 2021-10-06 MED ORDER — POTASSIUM CHLORIDE 20 MEQ PO PACK
40.0000 meq | PACK | Freq: Once | ORAL | Status: AC
  Administered 2021-10-06: 40 meq via ORAL
  Filled 2021-10-06: qty 2

## 2021-10-06 MED ORDER — GLUCOSAMINE-CHONDROITIN PO CAPS: ORAL_CAPSULE | Freq: Two times a day (BID) | ORAL | Status: DC

## 2021-10-06 NOTE — ED Triage Notes (Signed)
Patient to ED via EMS from home for c/o palpitations and tremors x 12 hours. Patient was seen in ED yesterday for same complaint. Patient denies chest pain, c/o dizziness and fluttering in her chest.

## 2021-10-06 NOTE — ED Notes (Signed)
Pt up to the bathroom at this time and back to bed without incident. Pt A&O x4, placed back on cardiac monitor. Pt denies further needs.

## 2021-10-06 NOTE — Discharge Instructions (Signed)
As we discussed, your evaluation was reassuring and we do not have a specific explanation for your symptoms.  You could have been a little bit dehydrated which was leading to some of the symptoms you were describing, but your CT scan of your head and MRI/MRA of your brain were normal, as were the rest of your labs and EKG.  Since you have not had any symptoms for a few hours we discussed it and agreed that it was appropriate for you to go home, but please follow-up with your primary care doctor at the next available opportunity and return to the emergency department if you develop new or worsening symptoms that concern you.

## 2021-10-06 NOTE — ED Notes (Signed)
Patient c/o inability to pee. Bladder scan showed 86ml.

## 2021-10-06 NOTE — ED Provider Notes (Signed)
[facility] Emergency Department Provider Note  ____________________________________________  Time seen: Approximately 10:14 PM  I have reviewed the triage vital signs and the nursing notes.   HISTORY  Chief Complaint Palpitations    HPI Lori [name] is a 87 y.o. female with a history of CKD diverticulosis GERD hypertension hypothyroidism who comes the ED complaining of dizzy spells that have been occurring for the last 2 days.  They last for a few seconds at a time.  No specific trigger.  Yesterday she had some passing out episodes and fell.  Today she is not passing out but she is having frequent dizzy episodes associated with a fluttering feeling in her chest.  Denies chest pain or shortness of breath, no fevers or chills, no vomiting, no shortness of breath, no headache vision changes paresthesias or weakness.    Past Medical History:  Diagnosis Date   Anemia    iron deficient   Arthritis    Autoimmune hepatitis ([place]) 05/10/2016   Hep C   B12 deficiency    Barrett esophagus 01/2016   Bradycardia    Chronic kidney disease    stage III   Diverticulosis    GERD (gastroesophageal reflux disease)    Hypertension    Hypothyroidism    Ulcer disease      Patient Active Problem List   Diagnosis Date Noted   PSVT (paroxysmal supraventricular tachycardia) ([place]) 10/06/2021   Numbness in feet 06/18/2021   CKD (chronic kidney disease) 06/12/2021   Dizziness 04/10/2021   Chest tightness 02/07/2021   Diarrhea 10/09/2020   Abdominal discomfort 09/27/2019   Gall bladder polyp 05/07/2018   Breast tenderness 05/07/2018   Hyperglycemia 09/10/2016   Fullness of breast 09/10/2016   Autoimmune hepatitis ([place]) 05/10/2016   MGUS (monoclonal gammopathy of unknown significance) 06/06/2015   LUQ fullness 06/06/2015   Health care maintenance 06/06/2015   Fatty tumor 03/07/2015   BMI 32.0-32.9,adult 01/02/2015   Abnormal liver function tests  01/02/2015   Hand cramps 11/15/2014   Leg cramps 06/13/2014   Osteopenia 03/02/2013   Hyperbilirubinemia 03/02/2013   Hypertension 12/22/2012   Gastroparesis 12/22/2012   Barrett's esophagus with esophagitis 12/22/2012   Hypothyroidism 12/22/2012   Anemia 12/22/2012   B12 deficiency 12/22/2012   CKD (chronic kidney disease) stage 3, GFR 30-59 ml/min ([place]) 12/22/2012     Past Surgical History:  Procedure Laterality Date   CATARACT EXTRACTION W/PHACO Left 08/30/2021   Procedure: CATARACT EXTRACTION PHACO AND INTRAOCULAR LENS PLACEMENT ([place]) LEFT 8.21 01:00.3;  Surgeon: Leandrew Koyanagi, MD;  Location: [place];  Service: Ophthalmology;  Laterality: Left;   CATARACT EXTRACTION W/PHACO Right 09/13/2021   Procedure: CATARACT EXTRACTION PHACO AND INTRAOCULAR LENS PLACEMENT ([place]) RIGHT;  Surgeon: Leandrew Koyanagi, MD;  Location: [place];  Service: Ophthalmology;  Laterality: Right;  8.11 00:59.8   CHOLECYSTECTOMY N/A 06/25/2018   Procedure: LAPAROSCOPIC CHOLECYSTECTOMY WITH INTRAOPERATIVE CHOLANGIOGRAM;  Surgeon: Robert Bellow, MD;  Location: [facility];  Service: General;  Laterality: N/A;   ESOPHAGOGASTRODUODENOSCOPY (EGD) WITH PROPOFOL N/A 01/30/2016   Procedure: ESOPHAGOGASTRODUODENOSCOPY (EGD) WITH PROPOFOL;  Surgeon: Lollie Sails, MD;  Location: [facility] ENDOSCOPY;  Service: Endoscopy;  Laterality: N/A;   ESOPHAGOGASTRODUODENOSCOPY (EGD) WITH PROPOFOL N/A 01/14/2018   Procedure: ESOPHAGOGASTRODUODENOSCOPY (EGD) WITH PROPOFOL;  Surgeon: Lollie Sails, MD;  Location: [facility] ENDOSCOPY;  Service: Endoscopy;  Laterality: N/A;   ESOPHAGOGASTRODUODENOSCOPY (EGD) WITH PROPOFOL N/A 05/20/2018   Procedure: ESOPHAGOGASTRODUODENOSCOPY (EGD) WITH PROPOFOL;  Surgeon: Lollie Sails, MD;  Location: [facility]  ENDOSCOPY;  Service: Endoscopy;  Laterality: N/A;   ESOPHAGOGASTRODUODENOSCOPY (EGD) WITH PROPOFOL N/A 01/13/2019   Procedure: ESOPHAGOGASTRODUODENOSCOPY (EGD) WITH  PROPOFOL;  Surgeon: Lollie Sails, MD;  Location: [facility] ENDOSCOPY;  Service: Endoscopy;  Laterality: N/A;   HERNIA REPAIR     lap hiatal hernia repair  07/08/08   LEFT OOPHORECTOMY  1993   benign ovarian cyst   TUBAL LIGATION  1981     Prior to Admission medications   Medication Sig Start Date End Date Taking? Authorizing Provider  aspirin 81 MG tablet Take 81 mg by mouth daily.   Yes [provider]  benazepril (LOTENSIN) 20 MG tablet Take 1 tablet by mouth daily. 09/14/21  Yes [provider]  calcitonin, salmon, (MIACALCIN/FORTICAL) 200 UNIT/ACT nasal spray One spray alternating nostrils qod 04/06/21  Yes Scott, Randell Patient, MD  Calcium-Phosphorus-Vitamin D (CITRACAL +D3 PO) Take 1 tablet by mouth daily.   Yes [provider]  chlorzoxazone (PARAFON) 500 MG tablet TAKE 1 TABLET BY MOUTH EVERY DAY AT BEDTIME AS NEEDED.  Do not take with ambien. 06/12/21  Yes Einar Pheasant, MD  Cyanocobalamin (B-12) 1000 MCG/ML KIT Inject 1,000 mcg as directed every 30 (thirty) days.    Yes [provider]  GLUCOSAMINE-CHONDROITIN PO Take 1 tablet by mouth 2 (two) times daily.    Yes [provider]  levothyroxine (SYNTHROID) 88 MCG tablet TAKE 1 TABLET BY MOUTH EVERY DAY 08/21/21  Yes Einar Pheasant, MD  Magnesium Oxide 400 (240 Mg) MG TABS Take 1 tablet (400 mg total) by mouth 2 (two) times daily. 09/24/19  Yes Einar Pheasant, MD  Mesalamine (ASACOL) 400 MG CPDR DR capsule Take 400 mg by mouth 2 (two) times daily.    Yes [provider]  pantoprazole (PROTONIX) 40 MG tablet Take 40 mg by mouth 2 (two) times daily.    Yes [provider]  Wheat Dextrin (BENEFIBER DRINK MIX) PACK Take 1 each by mouth daily.    Yes [provider]  zolpidem (AMBIEN CR) 6.25 MG CR tablet TAKE 1 TABLET BY MOUTH EVERY DAY AT BEDTIME AS NEEDED 09/24/21  Yes Einar Pheasant, MD     Allergies Penicillins, Daypro [oxaprozin], and Lodine [etodolac]   Family  History  Problem Relation Age of Onset   Stroke Mother    Breast cancer Neg Hx    Colon cancer Neg Hx     Social History Social History   Tobacco Use   Smoking status: Never   Smokeless tobacco: Never  Vaping Use   Vaping Use: Never used  Substance Use Topics   Alcohol use: No    Alcohol/week: 0.0 standard drinks   Drug use: No    Review of Systems  Constitutional:   No fever or chills.  ENT:   No sore throat. No rhinorrhea. Cardiovascular:   No chest pain or syncope.  Positive palpitations associated with lightheadedness. Respiratory:   No dyspnea or cough. Gastrointestinal:   Negative for abdominal pain, vomiting and diarrhea.  Musculoskeletal:   Negative for focal pain or swelling All other systems reviewed and are negative except as documented above in ROS and HPI.  ____________________________________________   PHYSICAL EXAM:  VITAL SIGNS: ED Triage Vitals [10/06/21 1930]  Enc Vitals Group     BP (!) 128/102     Pulse Rate 81     Resp (!) 21     Temp 98.4 F (36.9 C)     Temp Source Oral     SpO2 100 %  Weight      Height      Head Circumference      Peak Flow      Pain Score 0     Pain Loc      Pain Edu?      Excl. in [place]?     Vital signs reviewed, nursing assessments reviewed.   Constitutional:   Alert and oriented. Non-toxic appearance. Eyes:   Conjunctivae are normal. EOMI. PERRL. ENT      Head:   Normocephalic and atraumatic.      Nose:   Wearing a mask.      Mouth/Throat:   Wearing a mask.      Neck:   No meningismus. Full ROM. Hematological/Lymphatic/Immunilogical:   No cervical lymphadenopathy. Cardiovascular:   RRR. Symmetric bilateral radial and DP pulses.  No murmurs. Cap refill less than 2 seconds. Respiratory:   Normal respiratory effort without tachypnea/retractions. Breath sounds are clear and equal bilaterally. No wheezes/rales/rhonchi. Gastrointestinal:   Soft and nontender. Non distended. There is no CVA tenderness.  No  rebound, rigidity, or guarding. Genitourinary:   deferred Musculoskeletal:   Normal range of motion in all extremities. No joint effusions.  No lower extremity tenderness.  No edema. Neurologic:   Normal speech and language.  Motor grossly intact. No acute focal neurologic deficits are appreciated.  Skin:    Skin is warm, dry and intact. No rash noted.  No petechiae, purpura, or bullae.  ____________________________________________    LABS (pertinent positives/negatives) (all labs ordered are listed, but only abnormal results are displayed) Labs Reviewed  COMPREHENSIVE METABOLIC PANEL - Abnormal; Notable for the following components:      Result Value   CO2 19 (*)    Glucose, Bld 102 (*)    Creatinine, Ser 1.01 (*)    Total Bilirubin 2.1 (*)    GFR, Estimated 55 (*)    All other components within normal limits  RESP PANEL BY RT-PCR (FLU A&B, COVID) ARPGX2  CBC WITH DIFFERENTIAL/PLATELET  MAGNESIUM  TSH  BASIC METABOLIC PANEL  CBC  TROPONIN I (HIGH SENSITIVITY)  TROPONIN I (HIGH SENSITIVITY)   ____________________________________________   EKG  Interpreted by me Normal sinus rhythm rate of 80.  Normal axis and intervals.  Normal QRS ST segments and T waves.  No ischemic changes.  Repeat EKG at 7:39 PM during episode of palpitations and dizziness shows SVT with a heart rate of 180.  There is some diffuse ST depression, likely rate related demand ischemia.  No STEMI.  Spontaneously converted back to sinus rhythm on the strip.  ____________________________________________    RADIOLOGY  MR ANGIO HEAD WO CONTRAST  Result Date: 10/06/2021 CLINICAL DATA:  Initial evaluation for acute ataxia, vertigo. EXAM: MRI HEAD WITHOUT CONTRAST MRA HEAD WITHOUT CONTRAST TECHNIQUE: Multiplanar, multi-echo pulse sequences of the brain and surrounding structures were acquired without intravenous contrast. Angiographic images of the Circle of Willis were acquired using MRA technique without  intravenous contrast. COMPARISON:  CT from 10/05/2021. FINDINGS: MRI HEAD FINDINGS Brain: Moderately advanced cerebral atrophy with mild chronic small vessel ischemic disease. No abnormal foci of restricted diffusion to suggest acute or subacute ischemia. Gray-white matter differentiation maintained. No encephalomalacia to suggest chronic cortical infarction. No evidence for acute or chronic intracranial hemorrhage. No mass lesion, midline shift or mass effect. No hydrocephalus or extra-axial fluid collection. Pituitary gland suprasellar region normal. No intrinsic temporal lobe abnormality. Midline structures intact. Vascular: Major intracranial vascular flow voids are maintained. Skull and upper cervical spine: Craniocervical  junction within normal limits. Bone marrow signal intensity normal. Hyperostosis frontalis interna noted. No scalp soft tissue abnormality. Sinuses/Orbits: Prior bilateral ocular lens replacement with axial myopia. Paranasal sinuses are largely clear. No mastoid effusion. Inner ear structures grossly normal. Other: None. MRA HEAD FINDINGS Anterior circulation: Visualized cervical segments of the internal carotid arteries are widely patent with antegrade flow. Petrous, cavernous, and supraclinoid segments widely patent without stenosis. A1 segments patent bilaterally. Normal anterior communicating artery complex. Anterior cerebral arteries patent to their distal aspects without stenosis. No M1 stenosis or occlusion. Normal MCA bifurcations. Distal MCA branches well perfused and symmetric. Posterior circulation: Visualized vertebral arteries patent without stenosis. Left vertebral artery dominant. Neither PICA origin well visualized. Basilar mildly diminutive but patent to its distal aspect. Superior cerebellar arteries are patent bilaterally. Fetal type origin of both PCAs. PCAs remain patent to their distal aspects without stenosis. Anatomic variants: Fetal type origin of the PCAs.  No  aneurysm. IMPRESSION: MRI HEAD IMPRESSION: 1. No acute intracranial abnormality. 2. Moderately advanced cerebral atrophy with mild chronic small vessel ischemic disease. MRA HEAD IMPRESSION: Negative intracranial MRA. No large vessel occlusion, hemodynamically significant stenosis, or other acute vascular abnormality. Electronically Signed   By: Jeannine Boga M.D.   On: 10/06/2021 01:41   MR BRAIN WO CONTRAST  Result Date: 10/06/2021 CLINICAL DATA:  Initial evaluation for acute ataxia, vertigo. EXAM: MRI HEAD WITHOUT CONTRAST MRA HEAD WITHOUT CONTRAST TECHNIQUE: Multiplanar, multi-echo pulse sequences of the brain and surrounding structures were acquired without intravenous contrast. Angiographic images of the Circle of Willis were acquired using MRA technique without intravenous contrast. COMPARISON:  CT from 10/05/2021. FINDINGS: MRI HEAD FINDINGS Brain: Moderately advanced cerebral atrophy with mild chronic small vessel ischemic disease. No abnormal foci of restricted diffusion to suggest acute or subacute ischemia. Gray-white matter differentiation maintained. No encephalomalacia to suggest chronic cortical infarction. No evidence for acute or chronic intracranial hemorrhage. No mass lesion, midline shift or mass effect. No hydrocephalus or extra-axial fluid collection. Pituitary gland suprasellar region normal. No intrinsic temporal lobe abnormality. Midline structures intact. Vascular: Major intracranial vascular flow voids are maintained. Skull and upper cervical spine: Craniocervical junction within normal limits. Bone marrow signal intensity normal. Hyperostosis frontalis interna noted. No scalp soft tissue abnormality. Sinuses/Orbits: Prior bilateral ocular lens replacement with axial myopia. Paranasal sinuses are largely clear. No mastoid effusion. Inner ear structures grossly normal. Other: None. MRA HEAD FINDINGS Anterior circulation: Visualized cervical segments of the internal carotid  arteries are widely patent with antegrade flow. Petrous, cavernous, and supraclinoid segments widely patent without stenosis. A1 segments patent bilaterally. Normal anterior communicating artery complex. Anterior cerebral arteries patent to their distal aspects without stenosis. No M1 stenosis or occlusion. Normal MCA bifurcations. Distal MCA branches well perfused and symmetric. Posterior circulation: Visualized vertebral arteries patent without stenosis. Left vertebral artery dominant. Neither PICA origin well visualized. Basilar mildly diminutive but patent to its distal aspect. Superior cerebellar arteries are patent bilaterally. Fetal type origin of both PCAs. PCAs remain patent to their distal aspects without stenosis. Anatomic variants: Fetal type origin of the PCAs.  No aneurysm. IMPRESSION: MRI HEAD IMPRESSION: 1. No acute intracranial abnormality. 2. Moderately advanced cerebral atrophy with mild chronic small vessel ischemic disease. MRA HEAD IMPRESSION: Negative intracranial MRA. No large vessel occlusion, hemodynamically significant stenosis, or other acute vascular abnormality. Electronically Signed   By: Jeannine Boga M.D.   On: 10/06/2021 01:41    ____________________________________________   PROCEDURES .Critical Care Performed by: Carrie Mew, MD Authorized by:  Carrie Mew, MD   Critical care provider statement:    Critical care time (minutes):  33   Critical care time was exclusive of:  Separately billable procedures and treating other patients   Critical care was necessary to treat or prevent imminent or life-threatening deterioration of the following conditions:  Cardiac failure and circulatory failure   Critical care was time spent personally by me on the following activities:  Development of treatment plan with patient or surrogate, discussions with consultants, evaluation of patient's response to treatment, examination of patient, obtaining history from  patient or surrogate, ordering and performing treatments and interventions, ordering and review of laboratory studies, ordering and review of radiographic studies, pulse oximetry, re-evaluation of patient's condition and review of old charts  ____________________________________________    CLINICAL IMPRESSION / [place] / ED COURSE  Medications ordered in the ED: Medications  diltiazem (CARDIZEM) 125 mg in dextrose 5% 125 mL (1 mg/mL) infusion (5 mg/hr Intravenous New Bag/Given 10/06/21 2128)  aspirin EC tablet 81 mg (has no administration in time range)  benazepril (LOTENSIN) tablet 40 mg (has no administration in time range)  zolpidem (AMBIEN) tablet 5 mg (has no administration in time range)  calcitonin (salmon) (MIACALCIN/FORTICAL) nasal spray 1 spray (has no administration in time range)  levothyroxine (SYNTHROID) tablet 88 mcg (has no administration in time range)  Mesalamine (ASACOL) DR capsule 400 mg (has no administration in time range)  pantoprazole (PROTONIX) EC tablet 40 mg (has no administration in time range)  Benefiber Drink Mix PACK 1 each (has no administration in time range)  cyanocobalamin ((VITAMIN B-12)) injection 1,000 mcg (has no administration in time range)  Glucosamine-Chondroitin CAPS (has no administration in time range)  chlorzoxazone (PARAFON) tablet 500 mg (has no administration in time range)  Calcium-Phosphorus-Vitamin D 250-107-500 MG-MG-UNIT CHEW (has no administration in time range)  magnesium oxide (MAG-OX) tablet 400 mg (has no administration in time range)  enoxaparin (LOVENOX) injection 40 mg (has no administration in time range)  0.9 % NaCl with KCl 20 mEq/ L  infusion (has no administration in time range)  acetaminophen (TYLENOL) tablet 650 mg (has no administration in time range)    Or  acetaminophen (TYLENOL) suppository 650 mg (has no administration in time range)  traZODone (DESYREL) tablet 25 mg (has no administration in time  range)  magnesium hydroxide (MILK OF MAGNESIA) suspension 30 mL (has no administration in time range)  ondansetron (ZOFRAN) tablet 4 mg (has no administration in time range)    Or  ondansetron (ZOFRAN) injection 4 mg (has no administration in time range)  potassium chloride (KLOR-CON) packet 40 mEq (has no administration in time range)  sodium chloride 0.9 % bolus 500 mL (has no administration in time range)  magnesium sulfate IVPB 2 g 50 mL (has no administration in time range)  sodium chloride 0.9 % bolus 500 mL (500 mLs Intravenous New Bag/Given 10/06/21 2002)  metoprolol tartrate (LOPRESSOR) tablet 25 mg (25 mg Oral Given 10/06/21 2011)    Pertinent labs & imaging results that were available during my care of the patient were reviewed by me and considered in my medical decision making (see chart for details).  Lori [name] was evaluated in Emergency Department on 10/06/2021 for the symptoms described in the history of present illness. She was evaluated in the context of the global COVID-19 pandemic, which necessitated consideration that the patient might be at risk for infection with the SARS-CoV-2 virus that causes COVID-19. Institutional protocols and algorithms that  pertain to the evaluation of patients at risk for COVID-19 are in a state of rapid change based on information released by regulatory bodies including the CDC and federal and state organizations. These policies and algorithms were followed during the patient's care in the ED.   Patient presents with intermittent episodes of palpitations and lightheadedness.  During an episode in the ED, repeat EKG demonstrated that this is due to paroxysmal SVT.  Labs are normal, no evidence of electrolyte abnormality, hyperthyroidism, significant dehydration, or ACS.  The SVT was recurrent despite giving oral metoprolol, so I started her on a diltiazem infusion for rate and rhythm control and discussed with hospitalist for further  management.      ____________________________________________   FINAL CLINICAL IMPRESSION(S) / ED DIAGNOSES    Final diagnoses:  SVT (supraventricular tachycardia) ([place])  Near syncope     ED Discharge Orders     None       Portions of this note were generated with dragon dictation software. Dictation errors may occur despite best attempts at proofreading.    Carrie Mew, MD 10/06/21 2217

## 2021-10-06 NOTE — Progress Notes (Signed)
PHARMACIST - PHYSICIAN ORDER COMMUNICATION  CONCERNING: P&T Medication Policy on Herbal Medications  DESCRIPTION:  This patient's order for:  Glucosamine-chondroitin  has been noted.  This product(s) is classified as an "herbal" or natural product. Due to a lack of definitive safety studies or FDA approval, nonstandard manufacturing practices, plus the potential risk of unknown drug-drug interactions while on inpatient medications, the Pharmacy and Therapeutics Committee does not permit the use of "herbal" or natural products of this type within [place].   ACTION TAKEN: The pharmacy department is unable to verify this order at this time and your patient has been informed of this safety policy. Please reevaluate patient's clinical condition at discharge and address if the herbal or natural product(s) should be resumed at that time.   

## 2021-10-06 NOTE — ED Notes (Signed)
Pt taken to MRI at this time, UA sent to the lab.

## 2021-10-06 NOTE — H&P (Addendum)
[place]   PATIENT NAME: Lori [name]    MR#:  [id number]  DATE OF BIRTH:  05/05/1934  DATE OF ADMISSION:  10/06/2021  PRIMARY CARE PHYSICIAN: Einar Pheasant, MD   Patient is coming from: Home Home  REQUESTING/REFERRING PHYSICIAN: Brenton Grills, MD CHIEF COMPLAINT:   Chief Complaint  Patient presents with   Palpitations    HISTORY OF PRESENT ILLNESS:  Tomesha Sargent is a 87 y.o. Caucasian female with medical history significant for hypertension, dyslipidemia, peptic ulcer disease, autoimmune hepatitis and hepatitis C, stage III chronic kidney disease and GERD, who presented to the emergency room with onset of dizziness and presyncope with subsequent fall.  She denied any head injuries.  No paresthesias or focal muscle weakness.  She denied any nausea or vomiting or abdominal pain.  She has chronic diarrhea without any worsening.  She has been experiencing palpitations without chest pain associated with her dizziness.  No dyspnea or tachypnea or paroxysmal nocturnal dyspnea.  No cough or wheezing.  No bleeding diathesis.  The patient was seen yesterday in the ER for dizziness and palpitations and fall without injuries or neck pain or headache.  He was thought to be dehydrated and was managed and discharged.  ED Course: When patient came to the ER today, blood pressure was 128/102 and respiratory to 21 and heart rate was 81 and later 171.  She was in SVT goes back to normal sinus rhythm at 73 then SVT at 162.  Labs revealed magnesium 1.7 and potassium 3.6 with total bili of 2.1 otherwise unremarkable CMP.  CBC was within normal.  High-sensitivity troponin I was 8 and later 9.  Influenza antigens and COVID-19 PCR came back negative.  EKG as reviewed by me : initially showed normal sinus rhythm with a rate of 80 and repeat EKG showed SVT with a rate of 184 with low voltage QRS. Imaging: 2 view chest x-ray yesterday showed no acute cardiopulmonary disease.  The  patient was given 25 mg p.o. Lopressor, 500 mL IV normal saline bolus and was started on IV Cardizem drip.  She will be admitted to progressive care unit bed for further evaluation and management. PAST MEDICAL HISTORY:   Past Medical History:  Diagnosis Date   Anemia    iron deficient   Arthritis    Autoimmune hepatitis ([place]) 05/10/2016   Hep C   B12 deficiency    Barrett esophagus 01/2016   Bradycardia    Chronic kidney disease    stage III   Diverticulosis    GERD (gastroesophageal reflux disease)    Hypertension    Hypothyroidism    Ulcer disease     PAST SURGICAL HISTORY:   Past Surgical History:  Procedure Laterality Date   CATARACT EXTRACTION W/PHACO Left 08/30/2021   Procedure: CATARACT EXTRACTION PHACO AND INTRAOCULAR LENS PLACEMENT ([place]) LEFT 8.21 01:00.3;  Surgeon: Leandrew Koyanagi, MD;  Location: [place];  Service: Ophthalmology;  Laterality: Left;   CATARACT EXTRACTION W/PHACO Right 09/13/2021   Procedure: CATARACT EXTRACTION PHACO AND INTRAOCULAR LENS PLACEMENT ([place]) RIGHT;  Surgeon: Leandrew Koyanagi, MD;  Location: [place];  Service: Ophthalmology;  Laterality: Right;  8.11 00:59.8   CHOLECYSTECTOMY N/A 06/25/2018   Procedure: LAPAROSCOPIC CHOLECYSTECTOMY WITH INTRAOPERATIVE CHOLANGIOGRAM;  Surgeon: Robert Bellow, MD;  Location: [facility];  Service: General;  Laterality: N/A;   ESOPHAGOGASTRODUODENOSCOPY (EGD) WITH PROPOFOL N/A 01/30/2016   Procedure: ESOPHAGOGASTRODUODENOSCOPY (EGD) WITH PROPOFOL;  Surgeon: Lollie Sails, MD;  Location: [facility]  ENDOSCOPY;  Service: Endoscopy;  Laterality: N/A;   ESOPHAGOGASTRODUODENOSCOPY (EGD) WITH PROPOFOL N/A 01/14/2018   Procedure: ESOPHAGOGASTRODUODENOSCOPY (EGD) WITH PROPOFOL;  Surgeon: Lollie Sails, MD;  Location: [facility] ENDOSCOPY;  Service: Endoscopy;  Laterality: N/A;   ESOPHAGOGASTRODUODENOSCOPY (EGD) WITH PROPOFOL N/A 05/20/2018   Procedure: ESOPHAGOGASTRODUODENOSCOPY (EGD) WITH  PROPOFOL;  Surgeon: Lollie Sails, MD;  Location[facility] ENDOSCOPY;  Service: Endoscopy;  Laterality: N/A;   ESOPHAGOGASTRODUODENOSCOPY (EGD) WITH PROPOFOL N/A 01/13/2019   Procedure: ESOPHAGOGASTRODUODENOSCOPY (EGD) WITH PROPOFOL;  Surgeon: Lollie Sails, MD;  Location: [facility] ENDOSCOPY;  Service: Endoscopy;  Laterality: N/A;   HERNIA REPAIR     lap hiatal hernia repair  07/08/08   LEFT OOPHORECTOMY  1993   benign ovarian cyst   TUBAL LIGATION  1981    SOCIAL HISTORY:   Social History   Tobacco Use   Smoking status: Never   Smokeless tobacco: Never  Substance Use Topics   Alcohol use: No    Alcohol/week: 0.0 standard drinks    FAMILY HISTORY:   Family History  Problem Relation Age of Onset   Stroke Mother    Breast cancer Neg Hx    Colon cancer Neg Hx     DRUG ALLERGIES:   Allergies  Allergen Reactions   Penicillins Other (See Comments)    Dizziness and passed out Has patient had a PCN reaction causing immediate rash, facial/tongue/throat swelling, SOB or lightheadedness with hypotension: No Has patient had a PCN reaction causing severe rash involving mucus membranes or skin necrosis: No Has patient had a PCN reaction that required hospitalization: No Has patient had a PCN reaction occurring within the last 10 years: No  If all of the above answers are "NO", then may proceed with Cephalosporin use.    Daypro [Oxaprozin] Rash   Lodine [Etodolac] Rash    REVIEW OF SYSTEMS:   ROS As per history of present illness. All pertinent systems were reviewed above. Constitutional, HEENT, cardiovascular, respiratory, GI, GU, musculoskeletal, neuro, psychiatric, endocrine, integumentary and hematologic systems were reviewed and are otherwise negative/unremarkable except for positive findings mentioned above in the HPI.   MEDICATIONS AT HOME:   Prior to Admission medications   Medication Sig Start Date End Date Taking? Authorizing Provider  zolpidem (AMBIEN CR) 6.25 MG  CR tablet TAKE 1 TABLET BY MOUTH EVERY DAY AT BEDTIME AS NEEDED 09/24/21   Einar Pheasant, MD  aspirin 81 MG tablet Take 81 mg by mouth daily.    [provider]  benazepril (LOTENSIN) 40 MG tablet Take 1 tablet (40 mg total) by mouth daily. 06/12/21   Einar Pheasant, MD  calcitonin, salmon, (MIACALCIN/FORTICAL) 200 UNIT/ACT nasal spray One spray alternating nostrils qod 04/06/21   Einar Pheasant, MD  Calcium-Phosphorus-Vitamin D (CITRACAL +D3 PO) Take 1 tablet by mouth daily.    [provider]  chlorzoxazone (PARAFON) 500 MG tablet TAKE 1 TABLET BY MOUTH EVERY DAY AT BEDTIME AS NEEDED.  Do not take with ambien. 06/12/21   Einar Pheasant, MD  Cholecalciferol (D3-1000 PO) Take 1,000 Units by mouth daily.  Patient not taking: Reported on 08/17/2021    [provider]  Cyanocobalamin (B-12) 1000 MCG/ML KIT Inject 1,000 mcg as directed every 30 (thirty) days.     [provider]  GLUCOSAMINE-CHONDROITIN PO Take 1 tablet by mouth 2 (two) times daily.     [provider]  levothyroxine (SYNTHROID) 88 MCG tablet TAKE 1 TABLET BY MOUTH EVERY DAY 08/21/21   Einar Pheasant, MD  Magnesium Oxide 400 (  240 Mg) MG TABS Take 1 tablet (400 mg total) by mouth 2 (two) times daily. 09/24/19   Einar Pheasant, MD  Mesalamine (ASACOL) 400 MG CPDR DR capsule Take 400 mg by mouth 2 (two) times daily.     [provider]  pantoprazole (PROTONIX) 40 MG tablet Take 40 mg by mouth 2 (two) times daily.     [provider]  Wheat Dextrin (BENEFIBER DRINK MIX) PACK Take 1 each by mouth daily.     [provider]      VITAL SIGNS:  Blood pressure 138/83, pulse 73, temperature 98.4 F (36.9 C), temperature source Oral, resp. rate 20, SpO2 100 %.  PHYSICAL EXAMINATION:  Physical Exam  GENERAL:  87 y.o.-year-old Caucasian female patient lying in the bed with no acute distress.  EYES: Pupils equal, round, reactive to light and accommodation. No scleral  icterus. Extraocular muscles intact.  HEENT: Head atraumatic, normocephalic. Oropharynx and nasopharynx clear.  NECK:  Supple, no jugular venous distention. No thyroid enlargement, no tenderness.  LUNGS: Normal breath sounds bilaterally, no wheezing, rales,rhonchi or crepitation. No use of accessory muscles of respiration.  CARDIOVASCULAR: Regular rate and rhythm, S1, S2 normal. No murmurs, rubs, or gallops.  ABDOMEN: Soft, nondistended, nontender. Bowel sounds present. No organomegaly or mass.  EXTREMITIES: No pedal edema, cyanosis, or clubbing.  NEUROLOGIC: Cranial nerves II through XII are intact. Muscle strength 5/5 in all extremities. Sensation intact. Gait not checked.  PSYCHIATRIC: The patient is alert and oriented x 3.  Normal affect and good eye contact. SKIN: No obvious rash, lesion, or ulcer.   LABORATORY PANEL:   CBC Recent Labs  Lab 10/06/21 1938  WBC 6.5  HGB 12.7  HCT 37.2  PLT 258   ------------------------------------------------------------------------------------------------------------------  Chemistries  Recent Labs  Lab 10/06/21 1938  NA 135  K 3.6  CL 102  CO2 19*  GLUCOSE 102*  BUN 20  CREATININE 1.01*  CALCIUM 9.6  MG 1.7  AST 25  ALT 15  ALKPHOS 52  BILITOT 2.1*   ------------------------------------------------------------------------------------------------------------------  Cardiac Enzymes No results for input(s): TROPONINI in the last 168 hours. ------------------------------------------------------------------------------------------------------------------  RADIOLOGY:  DG Chest 2 View  Result Date: 10/05/2021 CLINICAL DATA:  Palpitations EXAM: CHEST - 2 VIEW COMPARISON:  None. FINDINGS: Lungs are well expanded, symmetric, and clear. No pneumothorax or pleural effusion. Cardiac size within normal limits. Pulmonary vascularity is normal. Osseous structures are age-appropriate. No acute bone abnormality. IMPRESSION: No active  cardiopulmonary disease. Electronically Signed   By: Fidela Salisbury M.D.   On: 10/05/2021 20:26   CT HEAD WO CONTRAST (5MM)  Result Date: 10/05/2021 CLINICAL DATA:  Dizziness and history of recent fall, initial encounter EXAM: CT HEAD WITHOUT CONTRAST TECHNIQUE: Contiguous axial images were obtained from the base of the skull through the vertex without intravenous contrast. COMPARISON:  None. FINDINGS: Brain: No evidence of acute infarction, hemorrhage, hydrocephalus, extra-axial collection or mass lesion/mass effect. Mild atrophic changes are noted. Vascular: No hyperdense vessel or unexpected calcification. Skull: Normal. Negative for fracture or focal lesion. Sinuses/Orbits: No acute finding. Other: None. IMPRESSION: Mild atrophic changes without acute intracranial abnormality. Electronically Signed   By: Inez Catalina M.D.   On: 10/05/2021 21:27   MR ANGIO HEAD WO CONTRAST  Result Date: 10/06/2021 CLINICAL DATA:  Initial evaluation for acute ataxia, vertigo. EXAM: MRI HEAD WITHOUT CONTRAST MRA HEAD WITHOUT CONTRAST TECHNIQUE: Multiplanar, multi-echo pulse sequences of the brain and surrounding structures were acquired without intravenous contrast. Angiographic images of the [place] were acquired using MRA technique without intravenous contrast. COMPARISON:  CT from 10/05/2021. FINDINGS: MRI HEAD FINDINGS Brain: Moderately advanced cerebral atrophy with mild chronic small vessel ischemic disease. No abnormal foci of restricted diffusion to suggest acute or subacute ischemia. Gray-white matter differentiation maintained. No encephalomalacia to suggest chronic cortical infarction. No evidence for acute or chronic intracranial hemorrhage. No mass lesion, midline shift or mass effect. No hydrocephalus or extra-axial fluid collection. Pituitary gland suprasellar region normal. No intrinsic temporal lobe abnormality. Midline structures intact. Vascular: Major intracranial vascular flow voids are  maintained. Skull and upper cervical spine: Craniocervical junction within normal limits. Bone marrow signal intensity normal. Hyperostosis frontalis interna noted. No scalp soft tissue abnormality. Sinuses/Orbits: Prior bilateral ocular lens replacement with axial myopia. Paranasal sinuses are largely clear. No mastoid effusion. Inner ear structures grossly normal. Other: None. MRA HEAD FINDINGS Anterior circulation: Visualized cervical segments of the internal carotid arteries are widely patent with antegrade flow. Petrous, cavernous, and supraclinoid segments widely patent without stenosis. A1 segments patent bilaterally. Normal anterior communicating artery complex. Anterior cerebral arteries patent to their distal aspects without stenosis. No M1 stenosis or occlusion. Normal MCA bifurcations. Distal MCA branches well perfused and symmetric. Posterior circulation: Visualized vertebral arteries patent without stenosis. Left vertebral artery dominant. Neither PICA origin well visualized. Basilar mildly diminutive but patent to its distal aspect. Superior cerebellar arteries are patent bilaterally. Fetal type origin of both PCAs. PCAs remain patent to their distal aspects without stenosis. Anatomic variants: Fetal type origin of the PCAs.  No aneurysm. IMPRESSION: MRI HEAD IMPRESSION: 1. No acute intracranial abnormality. 2. Moderately advanced cerebral atrophy with mild chronic small vessel ischemic disease. MRA HEAD IMPRESSION: Negative intracranial MRA. No large vessel occlusion, hemodynamically significant stenosis, or other acute vascular abnormality. Electronically Signed   By: Jeannine Boga M.D.   On: 10/06/2021 01:41   MR BRAIN WO CONTRAST  Result Date: 10/06/2021 CLINICAL DATA:  Initial evaluation for acute ataxia, vertigo. EXAM: MRI HEAD WITHOUT CONTRAST MRA HEAD WITHOUT CONTRAST TECHNIQUE: Multiplanar, multi-echo pulse sequences of the brain and surrounding structures were acquired without  intravenous contrast. Angiographic images of the [place] were acquired using MRA technique without intravenous contrast. COMPARISON:  CT from 10/05/2021. FINDINGS: MRI HEAD FINDINGS Brain: Moderately advanced cerebral atrophy with mild chronic small vessel ischemic disease. No abnormal foci of restricted diffusion to suggest acute or subacute ischemia. Gray-white matter differentiation maintained. No encephalomalacia to suggest chronic cortical infarction. No evidence for acute or chronic intracranial hemorrhage. No mass lesion, midline shift or mass effect. No hydrocephalus or extra-axial fluid collection. Pituitary gland suprasellar region normal. No intrinsic temporal lobe abnormality. Midline structures intact. Vascular: Major intracranial vascular flow voids are maintained. Skull and upper cervical spine: Craniocervical junction within normal limits. Bone marrow signal intensity normal. Hyperostosis frontalis interna noted. No scalp soft tissue abnormality. Sinuses/Orbits: Prior bilateral ocular lens replacement with axial myopia. Paranasal sinuses are largely clear. No mastoid effusion. Inner ear structures grossly normal. Other: None. MRA HEAD FINDINGS Anterior circulation: Visualized cervical segments of the internal carotid arteries are widely patent with antegrade flow. Petrous, cavernous, and supraclinoid segments widely patent without stenosis. A1 segments patent bilaterally. Normal anterior communicating artery complex. Anterior cerebral arteries patent to their distal aspects without stenosis. No M1 stenosis or occlusion. Normal MCA bifurcations. Distal MCA branches well perfused and symmetric. Posterior circulation: Visualized vertebral arteries patent without stenosis. Left vertebral artery dominant. Neither PICA origin well visualized. Basilar mildly diminutive but patent to its distal  aspect. Superior cerebellar arteries are patent bilaterally. Fetal type origin of both PCAs. PCAs remain  patent to their distal aspects without stenosis. Anatomic variants: Fetal type origin of the PCAs.  No aneurysm. IMPRESSION: MRI HEAD IMPRESSION: 1. No acute intracranial abnormality. 2. Moderately advanced cerebral atrophy with mild chronic small vessel ischemic disease. MRA HEAD IMPRESSION: Negative intracranial MRA. No large vessel occlusion, hemodynamically significant stenosis, or other acute vascular abnormality. Electronically Signed   By: Jeannine Boga M.D.   On: 10/06/2021 01:41      IMPRESSION AND PLAN:  Active Problems:   PSVT (paroxysmal supraventricular tachycardia) ([place])  1.  Paroxysmal supraventricular tachycardia with subsequent dizziness and presyncope. - The patient will be admitted to a PCU bed. - We will continue her on IV Cardizem drip. - We will optimize her potassium and magnesium. - We will obtain a 2D echo. - Cardiology consult will be obtained. - I notified Dr. Humphrey Rolls about the patient.  2.  Essential hypertension. - We will continue benazepril.  3.  Hypothyroidism. - We will continue Synthroid.  4.  GERD. - We will continue PPI therapy.  5.  Vitamin B12 deficiency. - We will continue vitamin B12.  DVT prophylaxis: Lovenox.  Code Status: full code.  Family Communication:  The plan of care was discussed in details with the patient (and family). I answered all questions. The patient agreed to proceed with the above mentioned plan. Further management will depend upon hospital course. Disposition Plan: Back to previous home environment Consults called: Cardiology.   All the records are reviewed and case discussed with ED provider.  Status is: Inpatient  Remains inpatient appropriate because:Ongoing diagnostic testing needed not appropriate for outpatient work up, Unsafe d/c plan, IV treatments appropriate due to intensity of illness or inability to take PO, and Inpatient level of care appropriate due to severity of illness  Dispo: The patient is  from: Home              Anticipated d/c is to: Home              Patient currently is not medically stable to d/c.   Difficult to place patient No   TOTAL TIME TAKING CARE OF THIS PATIENT: 55 minutes.    Christel Mormon M.D on 10/06/2021 at 9:32 PM  [facility]   From 7 PM-7 AM, contact night-coverage [email or web address]  CC: Primary care physician; Einar Pheasant, MD

## 2021-10-07 ENCOUNTER — Inpatient Hospital Stay (HOSPITAL_COMMUNITY)
Admit: 2021-10-07 | Discharge: 2021-10-07 | Disposition: A | Discharge status: Home / Self Care | Payer: Medicare Other | Attending: Family Medicine | Admitting: Family Medicine

## 2021-10-07 DIAGNOSIS — R55 Syncope and collapse: Secondary | ICD-10-CM

## 2021-10-07 DIAGNOSIS — I471 Supraventricular tachycardia: Secondary | ICD-10-CM | POA: Diagnosis present

## 2021-10-07 LAB — BASIC METABOLIC PANEL
Anion gap: 7 (ref 5–15)
BUN: 19 mg/dL (ref 8–23)
CO2: 21 mmol/L — ABNORMAL LOW (ref 22–32)
Calcium: 8.6 mg/dL — ABNORMAL LOW (ref 8.9–10.3)
Chloride: 109 mmol/L (ref 98–111)
Creatinine, Ser: 0.93 mg/dL (ref 0.44–1.00)
GFR, Estimated: >60 mL/min (ref >60)
Glucose, Bld: 96 mg/dL (ref 70–99)
Potassium: 4.2 mmol/L (ref 3.5–5.1)
Sodium: 137 mmol/L (ref 135–145)

## 2021-10-07 LAB — CBC
HCT: 35.2 % — ABNORMAL LOW (ref 36.0–46.0)
Hemoglobin: 11.6 g/dL — ABNORMAL LOW (ref 12.0–15.0)
MCH: 30.7 pg (ref 26.0–34.0)
MCHC: 33 g/dL (ref 30.0–36.0)
MCV: 93.1 fL (ref 80.0–100.0)
Platelets: 231 10*3/uL (ref 150–400)
RBC: 3.78 MIL/uL — ABNORMAL LOW (ref 3.87–5.11)
RDW: 13 % (ref 11.5–15.5)
WBC: 6 10*3/uL (ref 4.0–10.5)
nRBC: 0 % (ref 0.0–0.2)

## 2021-10-07 LAB — ECHOCARDIOGRAM COMPLETE
AR max vel: 2.03 cm2
AV Peak grad: 6 mmHg
Ao pk vel: 1.22 m/s
Area-P 1/2: 2.67 cm2
S' Lateral: 3.31 cm

## 2021-10-07 MED ORDER — SOTALOL HCL 80 MG PO TABS
80.0000 mg | ORAL_TABLET | Freq: Two times a day (BID) | ORAL | Status: DC
  Administered 2021-10-07 – 2021-10-08 (×2): 80 mg via ORAL
  Filled 2021-10-07 (×3): qty 1

## 2021-10-07 NOTE — Evaluation (Signed)
Physical Therapy Evaluation Patient Details Name: Lori [name] MRN: [id number] DOB: 04-17-1934 Today's Date: 10/07/2021  History of Present Illness  Lori [name] is a 87 y.o. Caucasian female with medical history significant for hypertension, dyslipidemia, peptic ulcer disease, autoimmune hepatitis and hepatitis C, stage III chronic kidney disease and GERD, who presented to the emergency room with onset of presyncope with subsequent couple falls. The patient was seen yesterday in the ER for dizziness and palpitations and fall without injuries or neck pain or headache.   Clinical Impression  Patient received on stretcher, She is agreeable to PT assessment. She performed bed mobility with mod independence, transfers with min guard/supervision and ambulated in room 30 feet with min guard. No AD. No lob or dizziness reported during mobility. Patient does report LE weakness and would like to work on that. She will continue to benefit from skilled PT while here to improve strength and activity tolerance.         Recommendations for follow up therapy are one component of a multi-disciplinary discharge planning process, led by the attending physician.  Recommendations may be updated based on patient status, additional functional criteria and insurance authorization.  Follow Up Recommendations Outpatient PT    Equipment Recommendations  None recommended by PT    Recommendations for Other Services       Precautions / Restrictions Precautions Precaution Comments: low fall Restrictions Weight Bearing Restrictions: No      Mobility  Bed Mobility Overal bed mobility: Modified Independent             General bed mobility comments: increased effort needed    Transfers Overall transfer level: Needs assistance Equipment used: None Transfers: Sit to/from Stand Sit to Stand: Supervision            Ambulation/Gait Ambulation/Gait assistance: Min guard Gait Distance  (Feet): 30 Feet Assistive device: 1 person hand held assist Gait Pattern/deviations: Step-through pattern Gait velocity: decr   General Gait Details: patient generally safe with ambulation in room. Reports LE weakness that has been present for a while  Stairs            Wheelchair Mobility    Modified Rankin (Stroke Patients Only)       Balance Overall balance assessment: Mild deficits observed, not formally tested                                           Pertinent Vitals/Pain Pain Assessment: No/denies pain    Home Living Family/patient expects to be discharged to:: Private residence Living Arrangements: Alone             Home Equipment: None      Prior Function Level of Independence: Independent         Comments: patient reports she was not using AD prior to admission     Hand Dominance        Extremity/Trunk Assessment   Upper Extremity Assessment Upper Extremity Assessment: Defer to OT evaluation    Lower Extremity Assessment Lower Extremity Assessment: Generalized weakness;RLE deficits/detail RLE Deficits / Details: patient reports occasional knee buckling and weakness RLE Coordination: decreased gross motor    Cervical / Trunk Assessment Cervical / Trunk Assessment: Normal  Communication   Communication: No difficulties  Cognition Arousal/Alertness: Awake/alert Behavior During Therapy: WFL for tasks assessed/performed Overall Cognitive Status: Within Functional Limits for tasks assessed  General Comments      Exercises     Assessment/Plan    PT Assessment Patient needs continued PT services  PT Problem List Decreased strength;Decreased mobility;Decreased activity tolerance;Decreased balance       PT Treatment Interventions Therapeutic activities;Gait training;Therapeutic exercise;Functional mobility training;Patient/family education    PT Goals  (Current goals can be found in the Care Plan section)  Acute Rehab PT Goals Patient Stated Goal: to return home PT Goal Formulation: With patient Time For Goal Achievement: 10/14/21 Potential to Achieve Goals: Good    Frequency Min 2X/week   Barriers to discharge Decreased caregiver support      Co-evaluation               AM-PAC PT "6 Clicks" Mobility  Outcome Measure Help needed turning from your back to your side while in a flat bed without using bedrails?: None Help needed moving from lying on your back to sitting on the side of a flat bed without using bedrails?: A Little Help needed moving to and from a bed to a chair (including a wheelchair)?: A Little Help needed standing up from a chair using your arms (e.g., wheelchair or bedside chair)?: A Little Help needed to walk in hospital room?: A Little Help needed climbing 3-5 steps with a railing? : A Little 6 Click Score: 19    End of Session Equipment Utilized During Treatment: Gait belt Activity Tolerance: Patient tolerated treatment well Patient left: in bed;with call bell/phone within reach Nurse Communication: Mobility status PT Visit Diagnosis: Unsteadiness on feet (R26.81);Muscle weakness (generalized) (M62.81);History of falling (Z91.81)    Time: 1100-1116 PT Time Calculation (min) (ACUTE ONLY): 16 min   Charges:   PT Evaluation $PT Eval Low Complexity: 1 Low          Maisen Klingler, PT, GCS 10/07/21,1:23 PM

## 2021-10-07 NOTE — Progress Notes (Signed)
*  PRELIMINARY RESULTS* Echocardiogram 2D Echocardiogram has been performed.  Lori [name] 10/07/2021, 8:24 AM

## 2021-10-07 NOTE — Progress Notes (Signed)
PROGRESS NOTE    Lori [name]  YOV:[id number] DOB: [birth year] DOA: 10/06/2021 PCP: Einar Pheasant, MD    Brief Narrative:  87 y.o. Caucasian female with medical history significant for hypertension, dyslipidemia, peptic ulcer disease, autoimmune hepatitis and hepatitis C, stage III chronic kidney disease and GERD, who presented to the emergency room with onset of presyncope with subsequent couple falls.  The patient was seen yesterday in the ER for dizziness and palpitations and fall without injuries or neck pain or headache.  Seen in consultation by cardiology.  Echocardiogram completed and normal ejection fraction.  Recommend Betapace 80 mg twice daily.   Assessment & Plan:   Active Problems:   PSVT (paroxysmal supraventricular tachycardia) (HCC)  Paroxysmal supraventricular tachycardia Syncopal event, likely secondary to above Initially on Cardizem drip 2D echocardiogram reassuring Potassium and magnesium within normal limits Cardiology consulted Plan: DC diltiazem Start Betapace 80 mg twice daily Telemetry monitoring Therapy evaluations Discharge in 24 hours if heart rate remained stable  Essential Hypertension PTA benazepril  Hypothyroidism PTA Synthroid  GERD PPI  Vitamin B12 deficiency PTA B12   DVT prophylaxis: SQ Lovenox Code Status: Full code Family Communication: Elyn Aquas [phone] on 10/8 Disposition Plan: Status is: Inpatient  Remains inpatient appropriate because:Inpatient level of care appropriate due to severity of illness  Dispo: The patient is from: Home              Anticipated d/c is to: Home              Patient currently is not medically stable to d/c.   Difficult to place patient No       Level of care: Progressive Cardiac  Consultants:  Cardiology  Procedures:  None  Antimicrobials: None   Subjective: Seen and examined.  Heart rate improved.  Patient feels well.  No complaints  Objective: Vitals:    10/07/21 0230 10/07/21 0500 10/07/21 0804 10/07/21 1126  BP: (!) 115/53 136/72 (!) 157/62 91/66  Pulse: 60 (!) 52 63 74  Resp:  17 16 18   Temp:    97.7 F (36.5 C)  TempSrc:    Oral  SpO2: 99% 94% 97% 96%    Intake/Output Summary (Last 24 hours) at 10/07/2021 1316 Last data filed at 10/07/2021 0100 Gross per 24 hour  Intake 192.7 ml  Output --  Net 192.7 ml   There were no vitals filed for this visit.  Examination:  General exam: Appears calm and comfortable  Respiratory system: Clear to auscultation. Respiratory effort normal. Cardiovascular system: S1-S2, RRR, no murmurs, no pedal edema Gastrointestinal system: Soft, NT/ND, normal bowel sounds Central nervous system: Alert and oriented. No focal neurological deficits. Extremities: Symmetric 5 x 5 power. Skin: No rashes, lesions or ulcers Psychiatry: Judgement and insight appear normal. Mood & affect appropriate.     Data Reviewed: I have personally reviewed following labs and imaging studies  CBC: Recent Labs  Lab 10/05/21 1940 10/06/21 1938 10/07/21 0620  WBC 6.8 6.5 6.0  NEUTROABS  --  4.5  --   HGB 14.1 12.7 11.6*  HCT 41.7 37.2 35.2*  MCV 91.0 90.1 93.1  PLT 227 258 720   Basic Metabolic Panel: Recent Labs  Lab 10/05/21 1940 10/06/21 1938 10/07/21 0620  NA 136 135 137  K 4.3 3.6 4.2  CL 102 102 109  CO2 23 19* 21*  GLUCOSE 108* 102* 96  BUN 21 20 19   CREATININE 0.90 1.01* 0.93  CALCIUM 10.4* 9.6 8.6*  MG  --  1.7  --    GFR: Estimated Creatinine Clearance: 37.8 mL/min (by C-G formula based on SCr of 0.93 mg/dL). Liver Function Tests: Recent Labs  Lab 10/06/21 1938  AST 25  ALT 15  ALKPHOS 52  BILITOT 2.1*  PROT 7.9  ALBUMIN 4.0   No results for input(s): LIPASE, AMYLASE in the last 168 hours. No results for input(s): AMMONIA in the last 168 hours. Coagulation Profile: No results for input(s): INR, PROTIME in the last 168 hours. Cardiac Enzymes: No results for input(s): CKTOTAL,  CKMB, CKMBINDEX, TROPONINI in the last 168 hours. BNP (last 3 results) No results for input(s): PROBNP in the last 8760 hours. HbA1C: No results for input(s): HGBA1C in the last 72 hours. CBG: No results for input(s): GLUCAP in the last 168 hours. Lipid Profile: No results for input(s): CHOL, HDL, LDLCALC, TRIG, CHOLHDL, LDLDIRECT in the last 72 hours. Thyroid Function Tests: Recent Labs    10/06/21 1938  TSH 2.464   Anemia Panel: No results for input(s): VITAMINB12, FOLATE, FERRITIN, TIBC, IRON, RETICCTPCT in the last 72 hours. Sepsis Labs: No results for input(s): PROCALCITON, LATICACIDVEN in the last 168 hours.  Recent Results (from the past 240 hour(s))  Resp Panel by RT-PCR (Flu A&B, Covid) Nasopharyngeal Swab     Status: None   Collection Time: 10/06/21  9:17 PM   Specimen: Nasopharyngeal Swab; Nasopharyngeal(NP) swabs in vial transport medium  Result Value Ref Range Status   SARS Coronavirus 2 by RT PCR NEGATIVE NEGATIVE Final    Comment: (NOTE) SARS-CoV-2 target nucleic acids are NOT DETECTED.  The SARS-CoV-2 RNA is generally detectable in upper respiratory specimens during the acute phase of infection. The lowest concentration of SARS-CoV-2 viral copies this assay can detect is 138 copies/mL. A negative result does not preclude SARS-Cov-2 infection and should not be used as the sole basis for treatment or other patient management decisions. A negative result may occur with  improper specimen collection/handling, submission of specimen other than nasopharyngeal swab, presence of viral mutation(s) within the areas targeted by this assay, and inadequate number of viral copies(<138 copies/mL). A negative result must be combined with clinical observations, patient history, and epidemiological information. The expected result is Negative.  Fact Sheet for Patients:  EntrepreneurPulse.com.au  Fact Sheet for Healthcare Providers:   IncredibleEmployment.be  This test is no t yet approved or cleared by the [place] FDA and  has been authorized for detection and/or diagnosis of SARS-CoV-2 by FDA under an Emergency Use Authorization (EUA). This EUA will remain  in effect (meaning this test can be used) for the duration of the COVID-19 declaration under Section 564(b)(1) of the Act, 21 U.S.C.section 360bbb-3(b)(1), unless the authorization is terminated  or revoked sooner.       Influenza A by PCR NEGATIVE NEGATIVE Final   Influenza B by PCR NEGATIVE NEGATIVE Final    Comment: (NOTE) The Xpert Xpress SARS-CoV-2/FLU/RSV plus assay is intended as an aid in the diagnosis of influenza from Nasopharyngeal swab specimens and should not be used as a sole basis for treatment. Nasal washings and aspirates are unacceptable for Xpert Xpress SARS-CoV-2/FLU/RSV testing.  Fact Sheet for Patients: EntrepreneurPulse.com.au  Fact Sheet for Healthcare Providers: IncredibleEmployment.be  This test is not yet approved or cleared by the [place] FDA and has been authorized for detection and/or diagnosis of SARS-CoV-2 by FDA under an Emergency Use Authorization (EUA). This EUA will remain in effect (meaning this test can be used) for the duration of the COVID-19 declaration  under Section 564(b)(1) of the Act, 21 U.S.C. section 360bbb-3(b)(1), unless the authorization is terminated or revoked.  Performed at [facility], [address], [place], [place] [address]          Radiology Studies: DG Chest 2 View  Result Date: 10/05/2021 CLINICAL DATA:  Palpitations EXAM: CHEST - 2 VIEW COMPARISON:  None. FINDINGS: Lungs are well expanded, symmetric, and clear. No pneumothorax or pleural effusion. Cardiac size within normal limits. Pulmonary vascularity is normal. Osseous structures are age-appropriate. No acute bone abnormality. IMPRESSION: No active  cardiopulmonary disease. Electronically Signed   By: Fidela Salisbury M.D.   On: 10/05/2021 20:26   CT HEAD WO CONTRAST (5MM)  Result Date: 10/05/2021 CLINICAL DATA:  Dizziness and history of recent fall, initial encounter EXAM: CT HEAD WITHOUT CONTRAST TECHNIQUE: Contiguous axial images were obtained from the base of the skull through the vertex without intravenous contrast. COMPARISON:  None. FINDINGS: Brain: No evidence of acute infarction, hemorrhage, hydrocephalus, extra-axial collection or mass lesion/mass effect. Mild atrophic changes are noted. Vascular: No hyperdense vessel or unexpected calcification. Skull: Normal. Negative for fracture or focal lesion. Sinuses/Orbits: No acute finding. Other: None. IMPRESSION: Mild atrophic changes without acute intracranial abnormality. Electronically Signed   By: Inez Catalina M.D.   On: 10/05/2021 21:27   MR ANGIO HEAD WO CONTRAST  Result Date: 10/06/2021 CLINICAL DATA:  Initial evaluation for acute ataxia, vertigo. EXAM: MRI HEAD WITHOUT CONTRAST MRA HEAD WITHOUT CONTRAST TECHNIQUE: Multiplanar, multi-echo pulse sequences of the brain and surrounding structures were acquired without intravenous contrast. Angiographic images of the Circle of Willis were acquired using MRA technique without intravenous contrast. COMPARISON:  CT from 10/05/2021. FINDINGS: MRI HEAD FINDINGS Brain: Moderately advanced cerebral atrophy with mild chronic small vessel ischemic disease. No abnormal foci of restricted diffusion to suggest acute or subacute ischemia. Gray-white matter differentiation maintained. No encephalomalacia to suggest chronic cortical infarction. No evidence for acute or chronic intracranial hemorrhage. No mass lesion, midline shift or mass effect. No hydrocephalus or extra-axial fluid collection. Pituitary gland suprasellar region normal. No intrinsic temporal lobe abnormality. Midline structures intact. Vascular: Major intracranial vascular flow voids are  maintained. Skull and upper cervical spine: Craniocervical junction within normal limits. Bone marrow signal intensity normal. Hyperostosis frontalis interna noted. No scalp soft tissue abnormality. Sinuses/Orbits: Prior bilateral ocular lens replacement with axial myopia. Paranasal sinuses are largely clear. No mastoid effusion. Inner ear structures grossly normal. Other: None. MRA HEAD FINDINGS Anterior circulation: Visualized cervical segments of the internal carotid arteries are widely patent with antegrade flow. Petrous, cavernous, and supraclinoid segments widely patent without stenosis. A1 segments patent bilaterally. Normal anterior communicating artery complex. Anterior cerebral arteries patent to their distal aspects without stenosis. No M1 stenosis or occlusion. Normal MCA bifurcations. Distal MCA branches well perfused and symmetric. Posterior circulation: Visualized vertebral arteries patent without stenosis. Left vertebral artery dominant. Neither PICA origin well visualized. Basilar mildly diminutive but patent to its distal aspect. Superior cerebellar arteries are patent bilaterally. Fetal type origin of both PCAs. PCAs remain patent to their distal aspects without stenosis. Anatomic variants: Fetal type origin of the PCAs.  No aneurysm. IMPRESSION: MRI HEAD IMPRESSION: 1. No acute intracranial abnormality. 2. Moderately advanced cerebral atrophy with mild chronic small vessel ischemic disease. MRA HEAD IMPRESSION: Negative intracranial MRA. No large vessel occlusion, hemodynamically significant stenosis, or other acute vascular abnormality. Electronically Signed   By: Jeannine Boga M.D.   On: 10/06/2021 01:41   MR BRAIN WO CONTRAST  Result Date:  10/06/2021 CLINICAL DATA:  Initial evaluation for acute ataxia, vertigo. EXAM: MRI HEAD WITHOUT CONTRAST MRA HEAD WITHOUT CONTRAST TECHNIQUE: Multiplanar, multi-echo pulse sequences of the brain and surrounding structures were acquired without  intravenous contrast. Angiographic images of the Circle of Willis were acquired using MRA technique without intravenous contrast. COMPARISON:  CT from 10/05/2021. FINDINGS: MRI HEAD FINDINGS Brain: Moderately advanced cerebral atrophy with mild chronic small vessel ischemic disease. No abnormal foci of restricted diffusion to suggest acute or subacute ischemia. Gray-white matter differentiation maintained. No encephalomalacia to suggest chronic cortical infarction. No evidence for acute or chronic intracranial hemorrhage. No mass lesion, midline shift or mass effect. No hydrocephalus or extra-axial fluid collection. Pituitary gland suprasellar region normal. No intrinsic temporal lobe abnormality. Midline structures intact. Vascular: Major intracranial vascular flow voids are maintained. Skull and upper cervical spine: Craniocervical junction within normal limits. Bone marrow signal intensity normal. Hyperostosis frontalis interna noted. No scalp soft tissue abnormality. Sinuses/Orbits: Prior bilateral ocular lens replacement with axial myopia. Paranasal sinuses are largely clear. No mastoid effusion. Inner ear structures grossly normal. Other: None. MRA HEAD FINDINGS Anterior circulation: Visualized cervical segments of the internal carotid arteries are widely patent with antegrade flow. Petrous, cavernous, and supraclinoid segments widely patent without stenosis. A1 segments patent bilaterally. Normal anterior communicating artery complex. Anterior cerebral arteries patent to their distal aspects without stenosis. No M1 stenosis or occlusion. Normal MCA bifurcations. Distal MCA branches well perfused and symmetric. Posterior circulation: Visualized vertebral arteries patent without stenosis. Left vertebral artery dominant. Neither PICA origin well visualized. Basilar mildly diminutive but patent to its distal aspect. Superior cerebellar arteries are patent bilaterally. Fetal type origin of both PCAs. PCAs remain  patent to their distal aspects without stenosis. Anatomic variants: Fetal type origin of the PCAs.  No aneurysm. IMPRESSION: MRI HEAD IMPRESSION: 1. No acute intracranial abnormality. 2. Moderately advanced cerebral atrophy with mild chronic small vessel ischemic disease. MRA HEAD IMPRESSION: Negative intracranial MRA. No large vessel occlusion, hemodynamically significant stenosis, or other acute vascular abnormality. Electronically Signed   By: Jeannine Boga M.D.   On: 10/06/2021 01:41   ECHOCARDIOGRAM COMPLETE  Result Date: 10/07/2021    ECHOCARDIOGRAM REPORT   Patient Name:   Lori [name] Date of Exam: 10/07/2021 Medical Rec #:  [id number]             Height:       60.0 in Accession #:    [id number]            Weight:       148.0 lb Date of Birth:  07-03-1934             BSA:          1.642 m Patient Age:    54 years              BP:           136/72 mmHg Patient Gender: F                     HR:           60 bpm. Exam Location:  [facility] Procedure: 2D Echo and Strain Analysis Indications:     Syncope R55  History:         Patient has no prior history of Echocardiogram examinations.  Sonographer:     Kathlen Brunswick RDCS Referring Phys:  [id number] [place] Diagnosing Phys: Neoma Laming  Sonographer Comments: Global longitudinal strain was attempted. IMPRESSIONS  1. Left ventricular ejection fraction, by estimation, is 55 to 60%. The left ventricle has normal function. The left ventricle has no regional wall motion abnormalities. There is mild concentric left ventricular hypertrophy. Left ventricular diastolic parameters are consistent with Grade I diastolic dysfunction (impaired relaxation).  2. Right ventricular systolic function is normal. The right ventricular size is normal.  3. The mitral valve is normal in structure. Mild to moderate mitral valve regurgitation. No evidence of mitral stenosis.  4. The aortic valve is normal in structure. Aortic valve regurgitation is not visualized. No  aortic stenosis is present.  5. The inferior vena cava is normal in size with greater than 50% respiratory variability, suggesting right atrial pressure of 3 mmHg. FINDINGS  Left Ventricle: Left ventricular ejection fraction, by estimation, is 55 to 60%. The left ventricle has normal function. The left ventricle has no regional wall motion abnormalities. The left ventricular internal cavity size was normal in size. There is  mild concentric left ventricular hypertrophy. Left ventricular diastolic parameters are consistent with Grade I diastolic dysfunction (impaired relaxation). Right Ventricle: The right ventricular size is normal. No increase in right ventricular wall thickness. Right ventricular systolic function is normal. Left Atrium: Left atrial size was normal in size. Right Atrium: Right atrial size was normal in size. Pericardium: There is no evidence of pericardial effusion. Mitral Valve: The mitral valve is normal in structure. Mild to moderate mitral valve regurgitation. No evidence of mitral valve stenosis. Tricuspid Valve: The tricuspid valve is normal in structure. Tricuspid valve regurgitation is trivial. No evidence of tricuspid stenosis. Aortic Valve: The aortic valve is normal in structure. Aortic valve regurgitation is not visualized. No aortic stenosis is present. Aortic valve peak gradient measures 6.0 mmHg. Pulmonic Valve: The pulmonic valve was normal in structure. Pulmonic valve regurgitation is not visualized. No evidence of pulmonic stenosis. Aorta: The aortic root is normal in size and structure. Venous: The inferior vena cava is normal in size with greater than 50% respiratory variability, suggesting right atrial pressure of 3 mmHg. IAS/Shunts: No atrial level shunt detected by color flow Doppler.  LEFT VENTRICLE PLAX 2D LVIDd:         4.73 cm   Diastology LVIDs:         3.31 cm   LV e' medial:    5.98 cm/s LV PW:         1.13 cm   LV E/e' medial:  10.7 LV IVS:        1.08 cm   LV e'  lateral:   7.94 cm/s LVOT diam:     1.80 cm   LV E/e' lateral: 8.0 LV SV:         57 LV SV Index:   35 LVOT Area:     2.54 cm  RIGHT VENTRICLE RV Basal diam:  2.82 cm RV S prime:     15.20 cm/s TAPSE (M-mode): 1.6 cm LEFT ATRIUM             Index        RIGHT ATRIUM           Index LA diam:        4.60 cm 2.80 cm/m   RA Area:     13.40 cm LA Vol (A2C):   40.1 ml 24.42 ml/m  RA Volume:   35.90 ml  21.86 ml/m LA Vol (A4C):   38.8 ml 23.62 ml/m LA Biplane Vol: 40.0 ml 24.35 ml/m  AORTIC VALVE  PULMONIC VALVE AV Area (Vmax): 2.03 cm     PV Vmax:       0.58 m/s AV Vmax:        122.00 cm/s  PV Peak grad:  1.4 mmHg AV Peak Grad:   6.0 mmHg LVOT Vmax:      97.30 cm/s LVOT Vmean:     63.200 cm/s LVOT VTI:       0.225 m  AORTA Ao Root diam: 2.90 cm Ao Asc diam:  3.00 cm MITRAL VALVE               TRICUSPID VALVE MV Area (PHT): 2.67 cm    TV Peak grad:   18.8 mmHg MV Decel Time: 284 msec    TV Vmax:        2.17 m/s MV E velocity: 63.80 cm/s MV A velocity: 70.70 cm/s  SHUNTS MV E/A ratio:  0.90        Systemic VTI:  0.22 m                            Systemic Diam: 1.80 cm [employer] Electronically signed by Neoma Laming Signature Date/Time: 10/07/2021/11:24:47 AM    Final         Scheduled Meds:  aspirin EC  81 mg Oral Daily   benazepril  40 mg Oral Daily   calcitonin (salmon)  1 spray Alternating Nares Daily   calcium-vitamin D  1 tablet Oral Daily   cyanocobalamin  1,000 mcg Intramuscular Q30 days   enoxaparin (LOVENOX) injection  40 mg Subcutaneous Q24H   fiber  1 packet Oral Daily   levothyroxine  88 mcg Oral Daily   magnesium oxide  400 mg Oral BID   Mesalamine  400 mg Oral BID   pantoprazole  40 mg Oral BID   sotalol  80 mg Oral Q12H   Continuous Infusions:   LOS: 1 day    Time spent: 35 minutes    Sidney Ace, MD [facility]   If 7PM-7AM, please contact night-coverage  10/07/2021, 1:16 PM

## 2021-10-07 NOTE — Evaluation (Signed)
Occupational Therapy Evaluation Patient Details Name: Lori [name] MRN: [id number] DOB: 03-Mar-1934 Today's Date: 10/07/2021   History of Present Illness Lori [name] is a 87 y.o. Caucasian female with medical history significant for hypertension, dyslipidemia, peptic ulcer disease, autoimmune hepatitis and hepatitis C, stage III chronic kidney disease and GERD, who presented to the emergency room with onset of presyncope with subsequent couple falls. The patient was seen yesterday in the ER for dizziness and palpitations and fall without injuries or neck pain or headache.   Clinical Impression   Pt seen for OT evaluation this date in setting of acute hospitalization d/t dizziness and fall, ultimately r/t arrhythmia. Pt presents this date reporting she feels better and on monitor, she is noted to have controlled HR with medications. Pt reports being INDEP at baseline including driving and running errands. She presents this date with some decreased fxl activity tolerance and has somewhat side-to-side walking pattern for fxl mobility at baseline d/t c/o chronic knee pain, weakness and reports occasional concern for buckling. She does not require assistance for any ADLs assessed this date and is able to perform transfers with no AD with SUPV to MOD I. Pt does not appear to have any acute OT needs at this time, but could potentially benefit from PT f/u outside of acute setting as she endorses difficulty ascending steps 2/2 aforementioned knee issues.     Recommendations for follow up therapy are one component of a multi-disciplinary discharge planning process, led by the attending physician.  Recommendations may be updated based on patient status, additional functional criteria and insurance authorization.   Follow Up Recommendations  No OT follow up    Equipment Recommendations  None recommended by OT    Recommendations for Other Services       Precautions / Restrictions  Precautions Precaution Comments: low fall Restrictions Weight Bearing Restrictions: No      Mobility Bed Mobility Overal bed mobility: Modified Independent             General bed mobility comments: increased effort needed    Transfers Overall transfer level: Needs assistance Equipment used: None Transfers: Sit to/from Stand Sit to Stand: Supervision;Modified independent (Device/Increase time)              Balance Overall balance assessment: Mild deficits observed, not formally tested                                         ADL either performed or assessed with clinical judgement   ADL Overall ADL's : Modified independent;At baseline                                             Vision Patient Visual Report: No change from baseline       Perception     Praxis      Pertinent Vitals/Pain Pain Assessment: No/denies pain     Hand Dominance     Extremity/Trunk Assessment Upper Extremity Assessment Upper Extremity Assessment: Overall WFL for tasks assessed   Lower Extremity Assessment Lower Extremity Assessment: RLE deficits/detail RLE Deficits / Details: h/o R knee weakness at baseline, states she is not able to rely on R LE as much with ascending steps RLE Coordination: decreased gross motor   Cervical / Trunk Assessment  Cervical / Trunk Assessment: Normal   Communication Communication Communication: No difficulties   Cognition Arousal/Alertness: Awake/alert Behavior During Therapy: WFL for tasks assessed/performed Overall Cognitive Status: Within Functional Limits for tasks assessed                                     General Comments       Exercises Other Exercises Other Exercises: OT ed re: role   Shoulder Instructions      Home Living Family/patient expects to be discharged to:: Private residence Living Arrangements: Alone Available Help at Discharge: Family;Available  PRN/intermittently ([facility]) Type of Home: House Home Access: Stairs to enter;Ramped entrance Entrance Stairs-Number of Steps: 2 STE in front, ramped entrance in back   Home Layout: One level     Bathroom Shower/Tub: Astronomer Accessibility: Yes How Accessible: Accessible via wheelchair Home Equipment: Shower seat - built in   Additional Comments: house was modified for her late husband who had a stroke. Pt does not use any AD, she does use ramped entrance, but not out of necessity.      Prior Functioning/Environment Level of Independence: Independent        Comments: Pt was INDEP with all I/ADLs and mobility including no AD for amb and was driving/running all errands. No other recent falls.        OT Problem List: Decreased activity tolerance      OT Treatment/Interventions: Self-care/ADL training    OT Goals(Current goals can be found in the care plan section) Acute Rehab OT Goals Patient Stated Goal: to return home OT Goal Formulation: All assessment and education complete, DC therapy  OT Frequency:     Barriers to D/C:            Co-evaluation              AM-PAC OT "6 Clicks" Daily Activity     Outcome Measure Help from another person eating meals?: None Help from another person taking care of personal grooming?: None Help from another person toileting, which includes using toliet, bedpan, or urinal?: None Help from another person bathing (including washing, rinsing, drying)?: None Help from another person to put on and taking off regular upper body clothing?: None Help from another person to put on and taking off regular lower body clothing?: None 6 Click Score: 24   End of Session Equipment Utilized During Treatment: Gait belt Nurse Communication: Mobility status  Activity Tolerance: Patient tolerated treatment well Patient left: in bed;with call bell/phone within reach  OT Visit Diagnosis: Dizziness and giddiness (R42)                 Time: 4158-3094 OT Time Calculation (min): 24 min Charges:  OT General Charges $OT Visit: 1 Visit OT Evaluation $OT Eval Low Complexity: 1 Low OT Treatments $Self Care/Home Management : 8-22 mins  Gerrianne Scale, MS, OTR/L ascom [phone] 10/07/21, 1:36 PM

## 2021-10-07 NOTE — Consult Note (Signed)
Lori Lori [name] is Lori [name] 87 y.o. female  [id number]  Primary Cardiologist: Neoma Laming Reason for Consultation: Syncope and dizziness  HPI: This is Lori [name] 87 year old white female who presented to the hospital after having episode of dizziness and palpitation and was found to be in SVT.   Review of Systems: Had Lori [name] brief episode of passing out   Past Medical History:  Diagnosis Date   Anemia    iron deficient   Arthritis    Autoimmune hepatitis ([place]) 05/10/2016   Hep C   B12 deficiency    Barrett esophagus 01/2016   Bradycardia    Chronic kidney disease    stage III   Diverticulosis    GERD (gastroesophageal reflux disease)    Hypertension    Hypothyroidism    Ulcer disease     (Not in Lori [name] hospital admission)     aspirin EC  81 mg Oral Daily   benazepril  40 mg Oral Daily   calcitonin (salmon)  1 spray Alternating Nares Daily   calcium-vitamin D  1 tablet Oral Daily   cyanocobalamin  1,000 mcg Intramuscular Q30 days   enoxaparin (LOVENOX) injection  40 mg Subcutaneous Q24H   fiber  1 packet Oral Daily   levothyroxine  88 mcg Oral Daily   magnesium oxide  400 mg Oral BID   Mesalamine  400 mg Oral BID   pantoprazole  40 mg Oral BID    Infusions:  diltiazem (CARDIZEM) infusion Stopped (10/07/21 0042)    Allergies  Allergen Reactions   Penicillins Other (See Comments)    Dizziness and passed out Has patient had Lori [name] PCN reaction causing immediate rash, facial/tongue/throat swelling, SOB or lightheadedness with hypotension: No Has patient had Lori [name] PCN reaction causing severe rash involving mucus membranes or skin necrosis: No Has patient had Lori [name] PCN reaction that required hospitalization: No Has patient had Lori [name] PCN reaction occurring within the last 10 years: No  If all of the above answers are "NO", then may proceed with Cephalosporin use.    Daypro [Oxaprozin] Rash   Lodine [Etodolac] Rash    Social History   Socioeconomic History   Marital status: Widowed     Spouse name: Not on file   Number of children: 0   Years of education: Not on file   Highest education level: Not on file  Occupational History   Not on file  Tobacco Use   Smoking status: Never   Smokeless tobacco: Never  Vaping Use   Vaping Use: Never used  Substance and Sexual Activity   Alcohol use: No    Alcohol/week: 0.0 standard drinks   Drug use: No   Sexual activity: Never  Other Topics Concern   Not on file  Social History Narrative   Not on file   Social Determinants of Health   Financial Resource Strain: Low Risk    Difficulty of Paying Living Expenses: Not hard at all  Food Insecurity: No Food Insecurity   Worried About Charity fundraiser in the Last Year: Never true   [place] in the Last Year: Never true  Transportation Needs: No Transportation Needs   Lack of Transportation (Medical): No   Lack of Transportation (Non-Medical): No  Physical Activity: Insufficiently Active   Days of Exercise per Week: 3 days   Minutes of Exercise per Session: 20 min  Stress: No Stress Concern Present   Feeling of Stress : Not at all  Social Connections: Unknown  Frequency of Communication with Friends and Family: More than three times Lori [name] week   Frequency of Social Gatherings with Friends and Family: More than three times Lori [name] week   Attends Religious Services: Not on Electrical engineer or Organizations: Not on file   Attends Archivist Meetings: Not on file   Marital Status: Not on file  Intimate Partner Violence: Not At Risk   Fear of Current or Ex-Partner: No   Emotionally Abused: No   Physically Abused: No   Sexually Abused: No    Family History  Problem Relation Age of Onset   Stroke Mother    Breast cancer Neg Hx    Colon cancer Neg Hx     PHYSICAL EXAM: Vitals:   10/07/21 0804 10/07/21 1126  BP: (!) 157/62 91/66  Pulse: 63 74  Resp: 16 18  Temp:  97.7 F (36.5 C)  SpO2: 97% 96%     Intake/Output Summary (Last 24 hours)  at 10/07/2021 1214 Last data filed at 10/07/2021 0100 Gross per 24 hour  Intake 192.7 ml  Output --  Net 192.7 ml    General:  Well appearing. No respiratory difficulty HEENT: normal Neck: supple. no JVD. Carotids 2+ bilat; no bruits. No lymphadenopathy or thryomegaly appreciated. Cor: PMI nondisplaced. Regular rate & rhythm. No rubs, gallops or murmurs. Lungs: clear Abdomen: soft, nontender, nondistended. No hepatosplenomegaly. No bruits or masses. Good bowel sounds. Extremities: no cyanosis, clubbing, rash, edema Neuro: alert & oriented x 3, cranial nerves grossly intact. moves all 4 extremities w/o difficulty. Affect pleasant.  ECG: Supraventricular tachycardia about 180 bpm.  Results for orders placed or performed during the hospital encounter of 10/06/21 (from the past 24 hour(s))  Comprehensive metabolic panel     Status: Abnormal   Collection Time: 10/06/21  7:38 PM  Result Value Ref Range   Sodium 135 135 - 145 mmol/L   Potassium 3.6 3.5 - 5.1 mmol/L   Chloride 102 98 - 111 mmol/L   CO2 19 (L) 22 - 32 mmol/L   Glucose, Bld 102 (H) 70 - 99 mg/dL   BUN 20 8 - 23 mg/dL   Creatinine, Ser 1.01 (H) 0.44 - 1.00 mg/dL   Calcium 9.6 8.9 - 10.3 mg/dL   Total Protein 7.9 6.5 - 8.1 g/dL   Albumin 4.0 3.5 - 5.0 g/dL   AST 25 15 - 41 U/L   ALT 15 0 - 44 U/L   Alkaline Phosphatase 52 38 - 126 U/L   Total Bilirubin 2.1 (H) 0.3 - 1.2 mg/dL   GFR, Estimated 55 (L) >60 mL/min   Anion gap 14 5 - 15  Troponin I (High Sensitivity)     Status: None   Collection Time: 10/06/21  7:38 PM  Result Value Ref Range   Troponin I (High Sensitivity) 8 <18 ng/L  CBC with Differential     Status: None   Collection Time: 10/06/21  7:38 PM  Result Value Ref Range   WBC 6.5 4.0 - 10.5 K/uL   RBC 4.13 3.87 - 5.11 MIL/uL   Hemoglobin 12.7 12.0 - 15.0 g/dL   HCT 37.2 36.0 - 46.0 %   MCV 90.1 80.0 - 100.0 fL   MCH 30.8 26.0 - 34.0 pg   MCHC 34.1 30.0 - 36.0 g/dL   RDW 12.5 11.5 - 15.5 %   Platelets  258 150 - 400 K/uL   nRBC 0.0 0.0 - 0.2 %   Neutrophils Relative % 69 %  Neutro Abs 4.5 1.7 - 7.7 K/uL   Lymphocytes Relative 18 %   Lymphs Abs 1.2 0.7 - 4.0 K/uL   Monocytes Relative 11 %   Monocytes Absolute 0.7 0.1 - 1.0 K/uL   Eosinophils Relative 1 %   Eosinophils Absolute 0.1 0.0 - 0.5 K/uL   Basophils Relative 1 %   Basophils Absolute 0.1 0.0 - 0.1 K/uL   Immature Granulocytes 0 %   Abs Immature Granulocytes 0.02 0.00 - 0.07 K/uL  Magnesium     Status: None   Collection Time: 10/06/21  7:38 PM  Result Value Ref Range   Magnesium 1.7 1.7 - 2.4 mg/dL  TSH     Status: None   Collection Time: 10/06/21  7:38 PM  Result Value Ref Range   TSH 2.464 0.350 - 4.500 uIU/mL  Resp Panel by RT-PCR (Flu Lori [name]&B, Covid) Nasopharyngeal Swab     Status: None   Collection Time: 10/06/21  9:17 PM   Specimen: Nasopharyngeal Swab; Nasopharyngeal(NP) swabs in vial transport medium  Result Value Ref Range   SARS Coronavirus 2 by RT PCR NEGATIVE NEGATIVE   Influenza Lori [name] by PCR NEGATIVE NEGATIVE   Influenza B by PCR NEGATIVE NEGATIVE  Troponin I (High Sensitivity)     Status: None   Collection Time: 10/06/21  9:49 PM  Result Value Ref Range   Troponin I (High Sensitivity) 9 <18 ng/L  Basic metabolic panel     Status: Abnormal   Collection Time: 10/07/21  6:20 AM  Result Value Ref Range   Sodium 137 135 - 145 mmol/L   Potassium 4.2 3.5 - 5.1 mmol/L   Chloride 109 98 - 111 mmol/L   CO2 21 (L) 22 - 32 mmol/L   Glucose, Bld 96 70 - 99 mg/dL   BUN 19 8 - 23 mg/dL   Creatinine, Ser 0.93 0.44 - 1.00 mg/dL   Calcium 8.6 (L) 8.9 - 10.3 mg/dL   GFR, Estimated >60 >60 mL/min   Anion gap 7 5 - 15  CBC     Status: Abnormal   Collection Time: 10/07/21  6:20 AM  Result Value Ref Range   WBC 6.0 4.0 - 10.5 K/uL   RBC 3.78 (L) 3.87 - 5.11 MIL/uL   Hemoglobin 11.6 (L) 12.0 - 15.0 g/dL   HCT 35.2 (L) 36.0 - 46.0 %   MCV 93.1 80.0 - 100.0 fL   MCH 30.7 26.0 - 34.0 pg   MCHC 33.0 30.0 - 36.0 g/dL   RDW  13.0 11.5 - 15.5 %   Platelets 231 150 - 400 K/uL   nRBC 0.0 0.0 - 0.2 %   DG Chest 2 View  Result Date: 10/05/2021 CLINICAL DATA:  Palpitations EXAM: CHEST - 2 VIEW COMPARISON:  None. FINDINGS: Lungs are well expanded, symmetric, and clear. No pneumothorax or pleural effusion. Cardiac size within normal limits. Pulmonary vascularity is normal. Osseous structures are age-appropriate. No acute bone abnormality. IMPRESSION: No active cardiopulmonary disease. Electronically Signed   By: Fidela Salisbury M.D.   On: 10/05/2021 20:26   CT HEAD WO CONTRAST (5MM)  Result Date: 10/05/2021 CLINICAL DATA:  Dizziness and history of recent fall, initial encounter EXAM: CT HEAD WITHOUT CONTRAST TECHNIQUE: Contiguous axial images were obtained from the base of the skull through the vertex without intravenous contrast. COMPARISON:  None. FINDINGS: Brain: No evidence of acute infarction, hemorrhage, hydrocephalus, extra-axial collection or mass lesion/mass effect. Mild atrophic changes are noted. Vascular: No hyperdense vessel or unexpected calcification. Skull: Normal. Negative  for fracture or focal lesion. Sinuses/Orbits: No acute finding. Other: None. IMPRESSION: Mild atrophic changes without acute intracranial abnormality. Electronically Signed   By: Inez Catalina M.D.   On: 10/05/2021 21:27   MR ANGIO HEAD WO CONTRAST  Result Date: 10/06/2021 CLINICAL DATA:  Initial evaluation for acute ataxia, vertigo. EXAM: MRI HEAD WITHOUT CONTRAST MRA HEAD WITHOUT CONTRAST TECHNIQUE: Multiplanar, multi-echo pulse sequences of the brain and surrounding structures were acquired without intravenous contrast. Angiographic images of the Circle of Willis were acquired using MRA technique without intravenous contrast. COMPARISON:  CT from 10/05/2021. FINDINGS: MRI HEAD FINDINGS Brain: Moderately advanced cerebral atrophy with mild chronic small vessel ischemic disease. No abnormal foci of restricted diffusion to suggest acute or  subacute ischemia. Gray-white matter differentiation maintained. No encephalomalacia to suggest chronic cortical infarction. No evidence for acute or chronic intracranial hemorrhage. No mass lesion, midline shift or mass effect. No hydrocephalus or extra-axial fluid collection. Pituitary gland suprasellar region normal. No intrinsic temporal lobe abnormality. Midline structures intact. Vascular: Major intracranial vascular flow voids are maintained. Skull and upper cervical spine: Craniocervical junction within normal limits. Bone marrow signal intensity normal. Hyperostosis frontalis interna noted. No scalp soft tissue abnormality. Sinuses/Orbits: Prior bilateral ocular lens replacement with axial myopia. Paranasal sinuses are largely clear. No mastoid effusion. Inner ear structures grossly normal. Other: None. MRA HEAD FINDINGS Anterior circulation: Visualized cervical segments of the internal carotid arteries are widely patent with antegrade flow. Petrous, cavernous, and supraclinoid segments widely patent without stenosis. A1 segments patent bilaterally. Normal anterior communicating artery complex. Anterior cerebral arteries patent to their distal aspects without stenosis. No M1 stenosis or occlusion. Normal MCA bifurcations. Distal MCA branches well perfused and symmetric. Posterior circulation: Visualized vertebral arteries patent without stenosis. Left vertebral artery dominant. Neither PICA origin well visualized. Basilar mildly diminutive but patent to its distal aspect. Superior cerebellar arteries are patent bilaterally. Fetal type origin of both PCAs. PCAs remain patent to their distal aspects without stenosis. Anatomic variants: Fetal type origin of the PCAs.  No aneurysm. IMPRESSION: MRI HEAD IMPRESSION: 1. No acute intracranial abnormality. 2. Moderately advanced cerebral atrophy with mild chronic small vessel ischemic disease. MRA HEAD IMPRESSION: Negative intracranial MRA. No large vessel  occlusion, hemodynamically significant stenosis, or other acute vascular abnormality. Electronically Signed   By: Jeannine Boga M.D.   On: 10/06/2021 01:41   MR BRAIN WO CONTRAST  Result Date: 10/06/2021 CLINICAL DATA:  Initial evaluation for acute ataxia, vertigo. EXAM: MRI HEAD WITHOUT CONTRAST MRA HEAD WITHOUT CONTRAST TECHNIQUE: Multiplanar, multi-echo pulse sequences of the brain and surrounding structures were acquired without intravenous contrast. Angiographic images of the Circle of Willis were acquired using MRA technique without intravenous contrast. COMPARISON:  CT from 10/05/2021. FINDINGS: MRI HEAD FINDINGS Brain: Moderately advanced cerebral atrophy with mild chronic small vessel ischemic disease. No abnormal foci of restricted diffusion to suggest acute or subacute ischemia. Gray-white matter differentiation maintained. No encephalomalacia to suggest chronic cortical infarction. No evidence for acute or chronic intracranial hemorrhage. No mass lesion, midline shift or mass effect. No hydrocephalus or extra-axial fluid collection. Pituitary gland suprasellar region normal. No intrinsic temporal lobe abnormality. Midline structures intact. Vascular: Major intracranial vascular flow voids are maintained. Skull and upper cervical spine: Craniocervical junction within normal limits. Bone marrow signal intensity normal. Hyperostosis frontalis interna noted. No scalp soft tissue abnormality. Sinuses/Orbits: Prior bilateral ocular lens replacement with axial myopia. Paranasal sinuses are largely clear. No mastoid effusion. Inner ear structures grossly normal. Other: None. MRA HEAD FINDINGS  Anterior circulation: Visualized cervical segments of the internal carotid arteries are widely patent with antegrade flow. Petrous, cavernous, and supraclinoid segments widely patent without stenosis. A1 segments patent bilaterally. Normal anterior communicating artery complex. Anterior cerebral arteries patent  to their distal aspects without stenosis. No M1 stenosis or occlusion. Normal MCA bifurcations. Distal MCA branches well perfused and symmetric. Posterior circulation: Visualized vertebral arteries patent without stenosis. Left vertebral artery dominant. Neither PICA origin well visualized. Basilar mildly diminutive but patent to its distal aspect. Superior cerebellar arteries are patent bilaterally. Fetal type origin of both PCAs. PCAs remain patent to their distal aspects without stenosis. Anatomic variants: Fetal type origin of the PCAs.  No aneurysm. IMPRESSION: MRI HEAD IMPRESSION: 1. No acute intracranial abnormality. 2. Moderately advanced cerebral atrophy with mild chronic small vessel ischemic disease. MRA HEAD IMPRESSION: Negative intracranial MRA. No large vessel occlusion, hemodynamically significant stenosis, or other acute vascular abnormality. Electronically Signed   By: Jeannine Boga M.D.   On: 10/06/2021 01:41   ECHOCARDIOGRAM COMPLETE  Result Date: 10/07/2021    ECHOCARDIOGRAM REPORT   Patient Name:   Lori Lori [name] Date of Exam: 10/07/2021 Medical Rec #:  [id number]             Height:       60.0 in Accession #:    [id number]            Weight:       148.0 lb Date of Birth:  Jun 07, 1934             BSA:          1.642 m Patient Age:    61 years              BP:           136/72 mmHg Patient Gender: F                     HR:           60 bpm. Exam Location:  [facility] Procedure: 2D Echo and Strain Analysis Indications:     Syncope R55  History:         Patient has no prior history of Echocardiogram examinations.  Sonographer:     Kathlen Brunswick RDCS Referring Phys:  [id number] [place] Diagnosing Phys: Neoma Laming  Sonographer Comments: Global longitudinal strain was attempted. IMPRESSIONS  1. Left ventricular ejection fraction, by estimation, is 55 to 60%. The left ventricle has normal function. The left ventricle has no regional wall motion abnormalities. There is mild concentric  left ventricular hypertrophy. Left ventricular diastolic parameters are consistent with Grade I diastolic dysfunction (impaired relaxation).  2. Right ventricular systolic function is normal. The right ventricular size is normal.  3. The mitral valve is normal in structure. Mild to moderate mitral valve regurgitation. No evidence of mitral stenosis.  4. The aortic valve is normal in structure. Aortic valve regurgitation is not visualized. No aortic stenosis is present.  5. The inferior vena cava is normal in size with greater than 50% respiratory variability, suggesting right atrial pressure of 3 mmHg. FINDINGS  Left Ventricle: Left ventricular ejection fraction, by estimation, is 55 to 60%. The left ventricle has normal function. The left ventricle has no regional wall motion abnormalities. The left ventricular internal cavity size was normal in size. There is  mild concentric left ventricular hypertrophy. Left ventricular diastolic parameters are consistent with Grade I diastolic dysfunction (impaired relaxation). Right Ventricle: The right  ventricular size is normal. No increase in right ventricular wall thickness. Right ventricular systolic function is normal. Left Atrium: Left atrial size was normal in size. Right Atrium: Right atrial size was normal in size. Pericardium: There is no evidence of pericardial effusion. Mitral Valve: The mitral valve is normal in structure. Mild to moderate mitral valve regurgitation. No evidence of mitral valve stenosis. Tricuspid Valve: The tricuspid valve is normal in structure. Tricuspid valve regurgitation is trivial. No evidence of tricuspid stenosis. Aortic Valve: The aortic valve is normal in structure. Aortic valve regurgitation is not visualized. No aortic stenosis is present. Aortic valve peak gradient measures 6.0 mmHg. Pulmonic Valve: The pulmonic valve was normal in structure. Pulmonic valve regurgitation is not visualized. No evidence of pulmonic stenosis. Aorta:  The aortic root is normal in size and structure. Venous: The inferior vena cava is normal in size with greater than 50% respiratory variability, suggesting right atrial pressure of 3 mmHg. IAS/Shunts: No atrial level shunt detected by color flow Doppler.  LEFT VENTRICLE PLAX 2D LVIDd:         4.73 cm   Diastology LVIDs:         3.31 cm   LV e' medial:    5.98 cm/s LV PW:         1.13 cm   LV E/e' medial:  10.7 LV IVS:        1.08 cm   LV e' lateral:   7.94 cm/s LVOT diam:     1.80 cm   LV E/e' lateral: 8.0 LV SV:         57 LV SV Index:   35 LVOT Area:     2.54 cm  RIGHT VENTRICLE RV Basal diam:  2.82 cm RV S prime:     15.20 cm/s TAPSE (M-mode): 1.6 cm LEFT ATRIUM             Index        RIGHT ATRIUM           Index LA diam:        4.60 cm 2.80 cm/m   RA Area:     13.40 cm LA Vol (A2C):   40.1 ml 24.42 ml/m  RA Volume:   35.90 ml  21.86 ml/m LA Vol (A4C):   38.8 ml 23.62 ml/m LA Biplane Vol: 40.0 ml 24.35 ml/m  AORTIC VALVE                 PULMONIC VALVE AV Area (Vmax): 2.03 cm     PV Vmax:       0.58 m/s AV Vmax:        122.00 cm/s  PV Peak grad:  1.4 mmHg AV Peak Grad:   6.0 mmHg LVOT Vmax:      97.30 cm/s LVOT Vmean:     63.200 cm/s LVOT VTI:       0.225 m  AORTA Ao Root diam: 2.90 cm Ao Asc diam:  3.00 cm MITRAL VALVE               TRICUSPID VALVE MV Area (PHT): 2.67 cm    TV Peak grad:   18.8 mmHg MV Decel Time: 284 msec    TV Vmax:        2.17 m/s MV E velocity: 63.80 cm/s MV Lori [name] velocity: 70.70 cm/s  SHUNTS MV E/Lori [name] ratio:  0.90        Systemic VTI:  0.22 m  Systemic Diam: 1.80 cm Neoma Laming Electronically signed by Neoma Laming Signature Date/Time: 10/07/2021/11:24:47 AM    Final      ASSESSMENT AND PLAN: SVT with normal ejection fraction on echocardiogram.  Advise starting the patient on sotalol 80 once Lori [name] day.  Lori Lori [name]

## 2021-10-08 MED ORDER — SOTALOL HCL 80 MG PO TABS: 80.0000 mg | ORAL_TABLET | Freq: Every day | ORAL | Status: DC

## 2021-10-08 MED ORDER — SOTALOL HCL 80 MG PO TABS: 80.0000 mg | ORAL_TABLET | Freq: Every day | ORAL | 0 refills | Status: DC

## 2021-10-08 NOTE — Discharge Summary (Signed)
Physician Discharge Summary  Lori [name] NID:[id number] DOB: 06-Sep-1934 DOA: 10/06/2021  PCP: Einar Pheasant, MD  Admit date: 10/06/2021 Discharge date: 10/08/2021  Admitted From: Home Disposition: Home  Recommendations for Outpatient Follow-up:  Follow up with PCP in 1-2 weeks Follow-up with cardiology Dr. Chancy Milroy on Tuesday 10/11 at [place]: No Equipment/Devices: None  Discharge Condition: Stable CODE STATUS: Full Diet recommendation: Heart healthy  Brief/Interim Summary:  87 y.o. Caucasian female with medical history significant for hypertension, dyslipidemia, peptic ulcer disease, autoimmune hepatitis and hepatitis C, stage III chronic kidney disease and GERD, who presented to the emergency room with onset of presyncope with subsequent couple falls.  The patient was seen yesterday in the ER for dizziness and palpitations and fall without injuries or neck pain or headache.  Seen in consultation by cardiology.  Echocardiogram completed and normal ejection fraction.  Recommend Betapace 80 mg daily.  Heart rate remained mid 40s to mid 60s.  Discussed with cardiology provider discharge.  Recommend sotalol 80 mg once daily on discharge.  Follow-up in cardiology office Tuesday 10/11 at 9 AM.  Follow-up instructions included in discharge packet.  Referral for outpatient physical therapy made at time of discharge.   Discharge Diagnoses:  Active Problems:   PSVT (paroxysmal supraventricular tachycardia) (HCC)   SVT (supraventricular tachycardia) (HCC)  Paroxysmal supraventricular tachycardia Syncopal event, likely secondary to above Initially on Cardizem drip 2D echocardiogram reassuring Potassium and magnesium within normal limits Cardiology consulted Started on sotalol 80 mg daily Therapy consulted, outpatient therapy recommended Cleared for discharge home Follow-up with cardiology, Dr.Kohn's office 10/11 at 9 AM   Essential Hypertension PTA benazepril    Hypothyroidism PTA Synthroid   GERD PPI   Vitamin B12 deficiency PTA B12  Discharge Instructions  Discharge Instructions     Ambulatory referral to Physical Therapy   Complete by: As directed    Diet - low sodium heart healthy   Complete by: As directed    Increase activity slowly   Complete by: As directed       Allergies as of 10/08/2021       Reactions   Penicillins Other (See Comments)   Dizziness and passed out Has patient had a PCN reaction causing immediate rash, facial/tongue/throat swelling, SOB or lightheadedness with hypotension: No Has patient had a PCN reaction causing severe rash involving mucus membranes or skin necrosis: No Has patient had a PCN reaction that required hospitalization: No Has patient had a PCN reaction occurring within the last 10 years: No  If all of the above answers are "NO", then may proceed with Cephalosporin use.   Daypro [oxaprozin] Rash   Lodine [etodolac] Rash        Medication List     TAKE these medications    aspirin 81 MG tablet Take 81 mg by mouth daily.   B-12 1000 MCG/ML Kit Inject 1,000 mcg as directed every 30 (thirty) days.   benazepril 20 MG tablet Commonly known as: LOTENSIN Take 1 tablet by mouth daily.   Benefiber Drink Mix Pack Take 1 each by mouth daily.   calcitonin (salmon) 200 UNIT/ACT nasal spray Commonly known as: MIACALCIN/FORTICAL One spray alternating nostrils qod   chlorzoxazone 500 MG tablet Commonly known as: PARAFON TAKE 1 TABLET BY MOUTH EVERY DAY AT BEDTIME AS NEEDED.  Do not take with ambien.   CITRACAL +D3 PO Take 1 tablet by mouth daily.   GLUCOSAMINE-CHONDROITIN PO Take 1 tablet by mouth 2 (two) times daily.  levothyroxine 88 MCG tablet Commonly known as: SYNTHROID TAKE 1 TABLET BY MOUTH EVERY DAY   magnesium oxide 400 (240 Mg) MG tablet Commonly known as: MAG-OX Take 1 tablet (400 mg total) by mouth 2 (two) times daily.   Mesalamine 400 MG Cpdr DR  capsule Commonly known as: ASACOL Take 400 mg by mouth 2 (two) times daily.   pantoprazole 40 MG tablet Commonly known as: PROTONIX Take 40 mg by mouth 2 (two) times daily.   sotalol 80 MG tablet Commonly known as: BETAPACE Take 1 tablet (80 mg total) by mouth daily.   zolpidem 6.25 MG CR tablet Commonly known as: AMBIEN CR TAKE 1 TABLET BY MOUTH EVERY DAY AT BEDTIME AS NEEDED        Follow-up Information     Dionisio David, MD Follow up on 10/10/2021.   Specialty: Cardiology Why: Follow up appointment Tuesday 10/11 at [place].  Please call office to confirm Contact information: [place] [address] [phone]                Allergies  Allergen Reactions   Penicillins Other (See Comments)    Dizziness and passed out Has patient had a PCN reaction causing immediate rash, facial/tongue/throat swelling, SOB or lightheadedness with hypotension: No Has patient had a PCN reaction causing severe rash involving mucus membranes or skin necrosis: No Has patient had a PCN reaction that required hospitalization: No Has patient had a PCN reaction occurring within the last 10 years: No  If all of the above answers are "NO", then may proceed with Cephalosporin use.    Daypro [Oxaprozin] Rash   Lodine [Etodolac] Rash    Consultations: Cardiology   Procedures/Studies: DG Chest 2 View  Result Date: 10/05/2021 CLINICAL DATA:  Palpitations EXAM: CHEST - 2 VIEW COMPARISON:  None. FINDINGS: Lungs are well expanded, symmetric, and clear. No pneumothorax or pleural effusion. Cardiac size within normal limits. Pulmonary vascularity is normal. Osseous structures are age-appropriate. No acute bone abnormality. IMPRESSION: No active cardiopulmonary disease. Electronically Signed   By: Fidela Salisbury M.D.   On: 10/05/2021 20:26   CT HEAD WO CONTRAST (5MM)  Result Date: 10/05/2021 CLINICAL DATA:  Dizziness and history of recent fall, initial encounter EXAM: CT HEAD  WITHOUT CONTRAST TECHNIQUE: Contiguous axial images were obtained from the base of the skull through the vertex without intravenous contrast. COMPARISON:  None. FINDINGS: Brain: No evidence of acute infarction, hemorrhage, hydrocephalus, extra-axial collection or mass lesion/mass effect. Mild atrophic changes are noted. Vascular: No hyperdense vessel or unexpected calcification. Skull: Normal. Negative for fracture or focal lesion. Sinuses/Orbits: No acute finding. Other: None. IMPRESSION: Mild atrophic changes without acute intracranial abnormality. Electronically Signed   By: Inez Catalina M.D.   On: 10/05/2021 21:27   MR ANGIO HEAD WO CONTRAST  Result Date: 10/06/2021 CLINICAL DATA:  Initial evaluation for acute ataxia, vertigo. EXAM: MRI HEAD WITHOUT CONTRAST MRA HEAD WITHOUT CONTRAST TECHNIQUE: Multiplanar, multi-echo pulse sequences of the brain and surrounding structures were acquired without intravenous contrast. Angiographic images of the Circle of Willis were acquired using MRA technique without intravenous contrast. COMPARISON:  CT from 10/05/2021. FINDINGS: MRI HEAD FINDINGS Brain: Moderately advanced cerebral atrophy with mild chronic small vessel ischemic disease. No abnormal foci of restricted diffusion to suggest acute or subacute ischemia. Gray-white matter differentiation maintained. No encephalomalacia to suggest chronic cortical infarction. No evidence for acute or chronic intracranial hemorrhage. No mass lesion, midline shift or mass effect. No hydrocephalus or extra-axial fluid collection.  Pituitary gland suprasellar region normal. No intrinsic temporal lobe abnormality. Midline structures intact. Vascular: Major intracranial vascular flow voids are maintained. Skull and upper cervical spine: Craniocervical junction within normal limits. Bone marrow signal intensity normal. Hyperostosis frontalis interna noted. No scalp soft tissue abnormality. Sinuses/Orbits: Prior bilateral ocular lens  replacement with axial myopia. Paranasal sinuses are largely clear. No mastoid effusion. Inner ear structures grossly normal. Other: None. MRA HEAD FINDINGS Anterior circulation: Visualized cervical segments of the internal carotid arteries are widely patent with antegrade flow. Petrous, cavernous, and supraclinoid segments widely patent without stenosis. A1 segments patent bilaterally. Normal anterior communicating artery complex. Anterior cerebral arteries patent to their distal aspects without stenosis. No M1 stenosis or occlusion. Normal MCA bifurcations. Distal MCA branches well perfused and symmetric. Posterior circulation: Visualized vertebral arteries patent without stenosis. Left vertebral artery dominant. Neither PICA origin well visualized. Basilar mildly diminutive but patent to its distal aspect. Superior cerebellar arteries are patent bilaterally. Fetal type origin of both PCAs. PCAs remain patent to their distal aspects without stenosis. Anatomic variants: Fetal type origin of the PCAs.  No aneurysm. IMPRESSION: MRI HEAD IMPRESSION: 1. No acute intracranial abnormality. 2. Moderately advanced cerebral atrophy with mild chronic small vessel ischemic disease. MRA HEAD IMPRESSION: Negative intracranial MRA. No large vessel occlusion, hemodynamically significant stenosis, or other acute vascular abnormality. Electronically Signed   By: Jeannine Boga M.D.   On: 10/06/2021 01:41   MR BRAIN WO CONTRAST  Result Date: 10/06/2021 CLINICAL DATA:  Initial evaluation for acute ataxia, vertigo. EXAM: MRI HEAD WITHOUT CONTRAST MRA HEAD WITHOUT CONTRAST TECHNIQUE: Multiplanar, multi-echo pulse sequences of the brain and surrounding structures were acquired without intravenous contrast. Angiographic images of the Circle of Willis were acquired using MRA technique without intravenous contrast. COMPARISON:  CT from 10/05/2021. FINDINGS: MRI HEAD FINDINGS Brain: Moderately advanced cerebral atrophy with mild  chronic small vessel ischemic disease. No abnormal foci of restricted diffusion to suggest acute or subacute ischemia. Gray-white matter differentiation maintained. No encephalomalacia to suggest chronic cortical infarction. No evidence for acute or chronic intracranial hemorrhage. No mass lesion, midline shift or mass effect. No hydrocephalus or extra-axial fluid collection. Pituitary gland suprasellar region normal. No intrinsic temporal lobe abnormality. Midline structures intact. Vascular: Major intracranial vascular flow voids are maintained. Skull and upper cervical spine: Craniocervical junction within normal limits. Bone marrow signal intensity normal. Hyperostosis frontalis interna noted. No scalp soft tissue abnormality. Sinuses/Orbits: Prior bilateral ocular lens replacement with axial myopia. Paranasal sinuses are largely clear. No mastoid effusion. Inner ear structures grossly normal. Other: None. MRA HEAD FINDINGS Anterior circulation: Visualized cervical segments of the internal carotid arteries are widely patent with antegrade flow. Petrous, cavernous, and supraclinoid segments widely patent without stenosis. A1 segments patent bilaterally. Normal anterior communicating artery complex. Anterior cerebral arteries patent to their distal aspects without stenosis. No M1 stenosis or occlusion. Normal MCA bifurcations. Distal MCA branches well perfused and symmetric. Posterior circulation: Visualized vertebral arteries patent without stenosis. Left vertebral artery dominant. Neither PICA origin well visualized. Basilar mildly diminutive but patent to its distal aspect. Superior cerebellar arteries are patent bilaterally. Fetal type origin of both PCAs. PCAs remain patent to their distal aspects without stenosis. Anatomic variants: Fetal type origin of the PCAs.  No aneurysm. IMPRESSION: MRI HEAD IMPRESSION: 1. No acute intracranial abnormality. 2. Moderately advanced cerebral atrophy with mild chronic  small vessel ischemic disease. MRA HEAD IMPRESSION: Negative intracranial MRA. No large vessel occlusion, hemodynamically significant stenosis, or other acute vascular abnormality. Electronically Signed  By: Jeannine Boga M.D.   On: 10/06/2021 01:41   ECHOCARDIOGRAM COMPLETE  Result Date: 10/07/2021    ECHOCARDIOGRAM REPORT   Patient Name:   Lori [name] Date of Exam: 10/07/2021 Medical Rec #:  [id number]             Height:       60.0 in Accession #:    [id number]            Weight:       148.0 lb Date of Birth:  06/08/1934             BSA:          1.642 m Patient Age:    29 years              BP:           136/72 mmHg Patient Gender: F                     HR:           60 bpm. Exam Location:  [facility] Procedure: 2D Echo and Strain Analysis Indications:     Syncope R55  History:         Patient has no prior history of Echocardiogram examinations.  Sonographer:     Kathlen Brunswick RDCS Referring Phys:  [id number] [place] Diagnosing Phys: Neoma Laming  Sonographer Comments: Global longitudinal strain was attempted. IMPRESSIONS  1. Left ventricular ejection fraction, by estimation, is 55 to 60%. The left ventricle has normal function. The left ventricle has no regional wall motion abnormalities. There is mild concentric left ventricular hypertrophy. Left ventricular diastolic parameters are consistent with Grade I diastolic dysfunction (impaired relaxation).  2. Right ventricular systolic function is normal. The right ventricular size is normal.  3. The mitral valve is normal in structure. Mild to moderate mitral valve regurgitation. No evidence of mitral stenosis.  4. The aortic valve is normal in structure. Aortic valve regurgitation is not visualized. No aortic stenosis is present.  5. The inferior vena cava is normal in size with greater than 50% respiratory variability, suggesting right atrial pressure of 3 mmHg. FINDINGS  Left Ventricle: Left ventricular ejection fraction, by estimation, is 55  to 60%. The left ventricle has normal function. The left ventricle has no regional wall motion abnormalities. The left ventricular internal cavity size was normal in size. There is  mild concentric left ventricular hypertrophy. Left ventricular diastolic parameters are consistent with Grade I diastolic dysfunction (impaired relaxation). Right Ventricle: The right ventricular size is normal. No increase in right ventricular wall thickness. Right ventricular systolic function is normal. Left Atrium: Left atrial size was normal in size. Right Atrium: Right atrial size was normal in size. Pericardium: There is no evidence of pericardial effusion. Mitral Valve: The mitral valve is normal in structure. Mild to moderate mitral valve regurgitation. No evidence of mitral valve stenosis. Tricuspid Valve: The tricuspid valve is normal in structure. Tricuspid valve regurgitation is trivial. No evidence of tricuspid stenosis. Aortic Valve: The aortic valve is normal in structure. Aortic valve regurgitation is not visualized. No aortic stenosis is present. Aortic valve peak gradient measures 6.0 mmHg. Pulmonic Valve: The pulmonic valve was normal in structure. Pulmonic valve regurgitation is not visualized. No evidence of pulmonic stenosis. Aorta: The aortic root is normal in size and structure. Venous: The inferior vena cava is normal in size with greater than 50% respiratory variability, suggesting right atrial pressure of 3  mmHg. IAS/Shunts: No atrial level shunt detected by color flow Doppler.  LEFT VENTRICLE PLAX 2D LVIDd:         4.73 cm   Diastology LVIDs:         3.31 cm   LV e' medial:    5.98 cm/s LV PW:         1.13 cm   LV E/e' medial:  10.7 LV IVS:        1.08 cm   LV e' lateral:   7.94 cm/s LVOT diam:     1.80 cm   LV E/e' lateral: 8.0 LV SV:         57 LV SV Index:   35 LVOT Area:     2.54 cm  RIGHT VENTRICLE RV Basal diam:  2.82 cm RV S prime:     15.20 cm/s TAPSE (M-mode): 1.6 cm LEFT ATRIUM             Index         RIGHT ATRIUM           Index LA diam:        4.60 cm 2.80 cm/m   RA Area:     13.40 cm LA Vol (A2C):   40.1 ml 24.42 ml/m  RA Volume:   35.90 ml  21.86 ml/m LA Vol (A4C):   38.8 ml 23.62 ml/m LA Biplane Vol: 40.0 ml 24.35 ml/m  AORTIC VALVE                 PULMONIC VALVE AV Area (Vmax): 2.03 cm     PV Vmax:       0.58 m/s AV Vmax:        122.00 cm/s  PV Peak grad:  1.4 mmHg AV Peak Grad:   6.0 mmHg LVOT Vmax:      97.30 cm/s LVOT Vmean:     63.200 cm/s LVOT VTI:       0.225 m  AORTA Ao Root diam: 2.90 cm Ao Asc diam:  3.00 cm MITRAL VALVE               TRICUSPID VALVE MV Area (PHT): 2.67 cm    TV Peak grad:   18.8 mmHg MV Decel Time: 284 msec    TV Vmax:        2.17 m/s MV E velocity: 63.80 cm/s MV A velocity: 70.70 cm/s  SHUNTS MV E/A ratio:  0.90        Systemic VTI:  0.22 m                            Systemic Diam: 1.80 cm Neoma Laming Electronically signed by Neoma Laming Signature Date/Time: 10/07/2021/11:24:47 AM    Final       Subjective: Seen and examined the day of discharge.  Stable no distress.  Chest pain-free.  Palpitations resolved.  Stable for discharge home.  Discharge Exam: Vitals:   10/08/21 0801 10/08/21 0828  BP: 138/63 (!) 133/59  Pulse: (!) 57 (!) 56  Resp: 18 18  Temp: 98.1 F (36.7 C) 98.2 F (36.8 C)  SpO2: 97% 97%   Vitals:   10/08/21 0354 10/08/21 0410 10/08/21 0801 10/08/21 0828  BP: (!) 147/54 (!) 132/59 138/63 (!) 133/59  Pulse: 62 (!) 55 (!) 57 (!) 56  Resp: _0 Temp: 98 F (36.7 C)  98.1 F (36.7 C) 98.2 F (36.8 C)  TempSrc: Oral  Oral Oral  SpO2: 97%  97% 97%  Weight:      Height:        General: Pt is alert, awake, not in acute distress Cardiovascular: RRR, S1/S2 +, no rubs, no gallops Respiratory: CTA bilaterally, no wheezing, no rhonchi Abdominal: Soft, NT, ND, bowel sounds + Extremities: no edema, no cyanosis    The results of significant diagnostics from this hospitalization (including imaging, microbiology, ancillary  and laboratory) are listed below for reference.     Microbiology: Recent Results (from the past 240 hour(s))  Resp Panel by RT-PCR (Flu A&B, Covid) Nasopharyngeal Swab     Status: None   Collection Time: 10/06/21  9:17 PM   Specimen: Nasopharyngeal Swab; Nasopharyngeal(NP) swabs in vial transport medium  Result Value Ref Range Status   SARS Coronavirus 2 by RT PCR NEGATIVE NEGATIVE Final    Comment: (NOTE) SARS-CoV-2 target nucleic acids are NOT DETECTED.  The SARS-CoV-2 RNA is generally detectable in upper respiratory specimens during the acute phase of infection. The lowest concentration of SARS-CoV-2 viral copies this assay can detect is 138 copies/mL. A negative result does not preclude SARS-Cov-2 infection and should not be used as the sole basis for treatment or other patient management decisions. A negative result may occur with  improper specimen collection/handling, submission of specimen other than nasopharyngeal swab, presence of viral mutation(s) within the areas targeted by this assay, and inadequate number of viral copies(<138 copies/mL). A negative result must be combined with clinical observations, patient history, and epidemiological information. The expected result is Negative.  Fact Sheet for Patients:  EntrepreneurPulse.com.au  Fact Sheet for Healthcare Providers:  IncredibleEmployment.be  This test is no t yet approved or cleared by the [place] FDA and  has been authorized for detection and/or diagnosis of SARS-CoV-2 by FDA under an Emergency Use Authorization (EUA). This EUA will remain  in effect (meaning this test can be used) for the duration of the COVID-19 declaration under Section 564(b)(1) of the Act, 21 U.S.C.section 360bbb-3(b)(1), unless the authorization is terminated  or revoked sooner.       Influenza A by PCR NEGATIVE NEGATIVE Final   Influenza B by PCR NEGATIVE NEGATIVE Final    Comment:  (NOTE) The Xpert Xpress SARS-CoV-2/FLU/RSV plus assay is intended as an aid in the diagnosis of influenza from Nasopharyngeal swab specimens and should not be used as a sole basis for treatment. Nasal washings and aspirates are unacceptable for Xpert Xpress SARS-CoV-2/FLU/RSV testing.  Fact Sheet for Patients: EntrepreneurPulse.com.au  Fact Sheet for Healthcare Providers: IncredibleEmployment.be  This test is not yet approved or cleared by the [place] FDA and has been authorized for detection and/or diagnosis of SARS-CoV-2 by FDA under an Emergency Use Authorization (EUA). This EUA will remain in effect (meaning this test can be used) for the duration of the COVID-19 declaration under Section 564(b)(1) of the Act, 21 U.S.C. section 360bbb-3(b)(1), unless the authorization is terminated or revoked.  Performed at [facility], [address] [place], [place] [address]      Labs: BNP (last 3 results) No results for input(s): BNP in the last 8760 hours. Basic Metabolic Panel: Recent Labs  Lab 10/05/21 1940 10/06/21 1938 10/07/21 0620  NA 136 135 137  K 4.3 3.6 4.2  CL 102 102 109  CO2 23 19* 21*  GLUCOSE 108* 102* 96  BUN _0 CREATININE 0.90 1.01* 0.93  CALCIUM 10.4* 9.6 8.6*  MG  --  1.7  --  Liver Function Tests: Recent Labs  Lab 10/06/21 1938  AST 25  ALT 15  ALKPHOS 52  BILITOT 2.1*  PROT 7.9  ALBUMIN 4.0   No results for input(s): LIPASE, AMYLASE in the last 168 hours. No results for input(s): AMMONIA in the last 168 hours. CBC: Recent Labs  Lab 10/05/21 1940 10/06/21 1938 10/07/21 0620  WBC 6.8 6.5 6.0  NEUTROABS  --  4.5  --   HGB 14.1 12.7 11.6*  HCT 41.7 37.2 35.2*  MCV 91.0 90.1 93.1  PLT 227 258 231   Cardiac Enzymes: No results for input(s): CKTOTAL, CKMB, CKMBINDEX, TROPONINI in the last 168 hours. BNP: Invalid input(s): POCBNP CBG: No results for input(s): GLUCAP in the  last 168 hours. D-Dimer No results for input(s): DDIMER in the last 72 hours. Hgb A1c No results for input(s): HGBA1C in the last 72 hours. Lipid Profile No results for input(s): CHOL, HDL, LDLCALC, TRIG, CHOLHDL, LDLDIRECT in the last 72 hours. Thyroid function studies Recent Labs    10/06/21 1938  TSH 2.464   Anemia work up No results for input(s): VITAMINB12, FOLATE, FERRITIN, TIBC, IRON, RETICCTPCT in the last 72 hours. Urinalysis    Component Value Date/Time   COLORURINE YELLOW (A) 10/06/2021 0027   APPEARANCEUR CLOUDY (A) 10/06/2021 0027   APPEARANCEUR Hazy 02/07/2012 0915   LABSPEC 1.010 10/06/2021 0027   LABSPEC 1.023 02/07/2012 0915   PHURINE 9.0 (H) 10/06/2021 0027   GLUCOSEU NEGATIVE 10/06/2021 0027   GLUCOSEU Negative 02/07/2012 0915   HGBUR SMALL (A) 10/06/2021 0027   BILIRUBINUR NEGATIVE 10/06/2021 0027   BILIRUBINUR Negative 02/07/2012 0915   KETONESUR 5 (A) 10/06/2021 0027   PROTEINUR 30 (A) 10/06/2021 0027   NITRITE NEGATIVE 10/06/2021 0027   LEUKOCYTESUR NEGATIVE 10/06/2021 0027   LEUKOCYTESUR 2+ 02/07/2012 0915   Sepsis Labs Invalid input(s): PROCALCITONIN,  WBC,  LACTICIDVEN Microbiology Recent Results (from the past 240 hour(s))  Resp Panel by RT-PCR (Flu A&B, Covid) Nasopharyngeal Swab     Status: None   Collection Time: 10/06/21  9:17 PM   Specimen: Nasopharyngeal Swab; Nasopharyngeal(NP) swabs in vial transport medium  Result Value Ref Range Status   SARS Coronavirus 2 by RT PCR NEGATIVE NEGATIVE Final    Comment: (NOTE) SARS-CoV-2 target nucleic acids are NOT DETECTED.  The SARS-CoV-2 RNA is generally detectable in upper respiratory specimens during the acute phase of infection. The lowest concentration of SARS-CoV-2 viral copies this assay can detect is 138 copies/mL. A negative result does not preclude SARS-Cov-2 infection and should not be used as the sole basis for treatment or other patient management decisions. A negative result may  occur with  improper specimen collection/handling, submission of specimen other than nasopharyngeal swab, presence of viral mutation(s) within the areas targeted by this assay, and inadequate number of viral copies(<138 copies/mL). A negative result must be combined with clinical observations, patient history, and epidemiological information. The expected result is Negative.  Fact Sheet for Patients:  EntrepreneurPulse.com.au  Fact Sheet for Healthcare Providers:  IncredibleEmployment.be  This test is no t yet approved or cleared by the [place] FDA and  has been authorized for detection and/or diagnosis of SARS-CoV-2 by FDA under an Emergency Use Authorization (EUA). This EUA will remain  in effect (meaning this test can be used) for the duration of the COVID-19 declaration under Section 564(b)(1) of the Act, 21 U.S.C.section 360bbb-3(b)(1), unless the authorization is terminated  or revoked sooner.       Influenza A by PCR  NEGATIVE NEGATIVE Final   Influenza B by PCR NEGATIVE NEGATIVE Final    Comment: (NOTE) The Xpert Xpress SARS-CoV-2/FLU/RSV plus assay is intended as an aid in the diagnosis of influenza from Nasopharyngeal swab specimens and should not be used as a sole basis for treatment. Nasal washings and aspirates are unacceptable for Xpert Xpress SARS-CoV-2/FLU/RSV testing.  Fact Sheet for Patients: EntrepreneurPulse.com.au  Fact Sheet for Healthcare Providers: IncredibleEmployment.be  This test is not yet approved or cleared by the [place] FDA and has been authorized for detection and/or diagnosis of SARS-CoV-2 by FDA under an Emergency Use Authorization (EUA). This EUA will remain in effect (meaning this test can be used) for the duration of the COVID-19 declaration under Section 564(b)(1) of the Act, 21 U.S.C. section 360bbb-3(b)(1), unless the authorization is terminated  or revoked.  Performed at [facility], [address] [place], [place] [address]      Time coordinating discharge: Over 30 minutes  SIGNED:   Sidney Ace, MD  [facility] 10/08/2021, 9:57 AM Pager   If 7PM-7AM, please contact night-coverage

## 2021-10-08 NOTE — TOC Initial Note (Signed)
Transition of Care [facility]) - Initial/Assessment Note    Patient Details  Name: Lori [name] MRN: [id number] Date of Birth: 12/05/1934  [facility] CM/SW Contact:    Magnus Ivan, LCSW Phone Number: 10/08/2021, 10:07 AM  Clinical Narrative:    Patient to discharge home today. Spoke to patient. PCP is Dr. Nicki Reaper. [facility]acy [facility]. Son will provide transportation home.  Patient agreeable to OPPT, would like to use [facility].  Referral is being faxed. Number provided for patient.                Expected Discharge Plan: OP Rehab Barriers to Discharge: Barriers Resolved   Patient Goals and CMS Choice Patient states their goals for this hospitalization and ongoing recovery are:: agreeable to OP rehab CMS Medicare.gov Compare Post Acute Care list provided to:: Patient Choice offered to / list presented to : Patient  Expected Discharge Plan and Services Expected Discharge Plan: OP Rehab       Living arrangements for the past 2 months: Single Family Home Expected Discharge Date: 10/08/21                                    Prior Living Arrangements/Services Living arrangements for the past 2 months: Single Family Home   Patient language and need for interpreter reviewed:: Yes Do you feel safe going back to the place where you live?: Yes      Need for Family Participation in Patient Care: Yes (Comment) Care giver support system in place?: Yes (comment)   Criminal Activity/Legal Involvement Pertinent to Current Situation/Hospitalization: No - Comment as needed  Activities of Daily Living Home Assistive Devices/Equipment: None ADL Screening (condition at time of admission) Patient's cognitive ability adequate to safely complete daily activities?: Yes Is the patient deaf or have difficulty hearing?: Yes Does the patient have difficulty seeing, even when wearing glasses/contacts?: No Does the patient have difficulty concentrating,  remembering, or making decisions?: No Patient able to express need for assistance with ADLs?: Yes Does the patient have difficulty dressing or bathing?: No Independently performs ADLs?: Yes (appropriate for developmental age) Does the patient have difficulty walking or climbing stairs?: No Weakness of Legs: None Weakness of Arms/Hands: None  Permission Sought/Granted Permission sought to share information with : Facility Art therapist granted to share information with : Yes, Verbal Permission Granted     Permission granted to share info w AGENCY: [place]        Emotional Assessment       Orientation: : Oriented to Self, Oriented to Place, Oriented to  Time, Oriented to Situation Alcohol / Substance Use: Not Applicable Psych Involvement: No (comment)  Admission diagnosis:  SVT (supraventricular tachycardia) (HCC) [I47.1] PSVT (paroxysmal supraventricular tachycardia) (HCC) [I47.1] Near syncope [R55] Patient Active Problem List   Diagnosis Date Noted   SVT (supraventricular tachycardia) ([place]) 10/07/2021   PSVT (paroxysmal supraventricular tachycardia) ([place]) 10/06/2021   Numbness in feet 06/18/2021   CKD (chronic kidney disease) 06/12/2021   Dizziness 04/10/2021   Chest tightness 02/07/2021   Diarrhea 10/09/2020   Abdominal discomfort 09/27/2019   Gall bladder polyp 05/07/2018   Breast tenderness 05/07/2018   Hyperglycemia 09/10/2016   Fullness of breast 09/10/2016   Autoimmune hepatitis ([place]) 05/10/2016   MGUS (monoclonal gammopathy of unknown significance) 06/06/2015   LUQ fullness 06/06/2015   Health care maintenance 06/06/2015   Fatty  tumor 03/07/2015   BMI 32.0-32.9,adult 01/02/2015   Abnormal liver function tests 01/02/2015   Hand cramps 11/15/2014   Leg cramps 06/13/2014   Osteopenia 03/02/2013   Hyperbilirubinemia 03/02/2013   Hypertension 12/22/2012   Gastroparesis 12/22/2012   Barrett's esophagus with esophagitis  12/22/2012   Hypothyroidism 12/22/2012   Anemia 12/22/2012   B12 deficiency 12/22/2012   CKD (chronic kidney disease) stage 3, GFR 30-59 ml/min ([place]) 12/22/2012   PCP:  Einar Pheasant, MD Pharmacy:   [place], [place] - [address] [place] [address] Phone: [phone] Fax: [phone]  [id number] [place] [place] [facility] [address] [place] [address] Phone: [phone] Fax: [phone]     Social Determinants of Health (SDOH) Interventions    Readmission Risk Interventions No flowsheet data found.

## 2021-10-08 NOTE — Plan of Care (Signed)

## 2021-10-08 NOTE — Plan of Care (Signed)
  Problem: Education: Goal: Knowledge of General Education information will improve Description: Including pain rating scale, medication(s)/side effects and non-pharmacologic comfort measures 10/08/2021 1024 by Olena Willy, Debbe Mounts, RN Outcome: Completed/Met 10/08/2021 1023 by Elsie Ra, RN Outcome: Progressing   Problem: Health Behavior/Discharge Planning: Goal: Ability to manage health-related needs will improve 10/08/2021 1024 by Andres Bantz, Debbe Mounts, RN Outcome: Completed/Met 10/08/2021 1023 by Elsie Ra, RN Outcome: Progressing   Problem: Clinical Measurements: Goal: Ability to maintain clinical measurements within normal limits will improve 10/08/2021 1024 by Wacey Zieger, Debbe Mounts, RN Outcome: Completed/Met 10/08/2021 1023 by Elsie Ra, RN Outcome: Progressing Goal: Will remain free from infection 10/08/2021 1024 by Clariece Roesler, Debbe Mounts, RN Outcome: Completed/Met 10/08/2021 1023 by Elsie Ra, RN Outcome: Progressing Goal: Diagnostic test results will improve 10/08/2021 1024 by Shailynn Fong, Debbe Mounts, RN Outcome: Completed/Met 10/08/2021 1023 by Elsie Ra, RN Outcome: Progressing Goal: Respiratory complications will improve 10/08/2021 1024 by Synai Prettyman, Debbe Mounts, RN Outcome: Completed/Met 10/08/2021 1023 by Elsie Ra, RN Outcome: Progressing Goal: Cardiovascular complication will be avoided 10/08/2021 1024 by Elsie Ra, RN Outcome: Completed/Met 10/08/2021 1023 by Elsie Ra, RN Outcome: Progressing   Problem: Activity: Goal: Risk for activity intolerance will decrease 10/08/2021 1024 by Oaklee Esther, Debbe Mounts, RN Outcome: Completed/Met 10/08/2021 1023 by Elsie Ra, RN Outcome: Progressing   Problem: Nutrition: Goal: Adequate nutrition will be maintained 10/08/2021 1024 by Davyon Fisch, Debbe Mounts, RN Outcome: Completed/Met 10/08/2021 1023 by Elsie Ra, RN Outcome: Progressing   Problem: Coping: Goal: Level  of anxiety will decrease 10/08/2021 1024 by Vertie Dibbern, Debbe Mounts, RN Outcome: Completed/Met 10/08/2021 1023 by Elsie Ra, RN Outcome: Progressing   Problem: Elimination: Goal: Will not experience complications related to bowel motility 10/08/2021 1024 by Avanell Banwart, Debbe Mounts, RN Outcome: Completed/Met 10/08/2021 1023 by Elsie Ra, RN Outcome: Progressing Goal: Will not experience complications related to urinary retention 10/08/2021 1024 by Diahn Waidelich, Debbe Mounts, RN Outcome: Completed/Met 10/08/2021 1023 by Elsie Ra, RN Outcome: Progressing   Problem: Pain Managment: Goal: General experience of comfort will improve 10/08/2021 1024 by Dortha Neighbors, Debbe Mounts, RN Outcome: Completed/Met 10/08/2021 1023 by Elsie Ra, RN Outcome: Progressing   Problem: Safety: Goal: Ability to remain free from injury will improve 10/08/2021 1024 by Henreitta Spittler, Debbe Mounts, RN Outcome: Completed/Met 10/08/2021 1023 by Elsie Ra, RN Outcome: Progressing   Problem: Skin Integrity: Goal: Risk for impaired skin integrity will decrease 10/08/2021 1024 by Elsie Ra, RN Outcome: Completed/Met 10/08/2021 1023 by Elsie Ra, RN Outcome: Progressing

## 2021-10-08 NOTE — Discharge Instructions (Signed)
[facility] Outpatient Physical Therapy [phone]

## 2021-10-08 NOTE — Progress Notes (Signed)
SUBJECTIVE: doing well   Vitals:   10/08/21 0354 10/08/21 0410 10/08/21 0801 10/08/21 0828  BP: (!) 147/54 (!) 132/59 138/63 (!) 133/59  Pulse: 62 (!) 55 (!) 57 (!) 56  Resp: 18  18 18   Temp: 98 F (36.7 C)  98.1 F (36.7 C) 98.2 F (36.8 C)  TempSrc: Oral  Oral Oral  SpO2: 97%  97% 97%  Weight:      Height:        Intake/Output Summary (Last 24 hours) at 10/08/2021 1116 Last data filed at 10/08/2021 1000 Gross per 24 hour  Intake 480 ml  Output --  Net 480 ml    LABS: Basic Metabolic Panel: Recent Labs    10/06/21 1938 10/07/21 0620  NA 135 137  K 3.6 4.2  CL 102 109  CO2 19* 21*  GLUCOSE 102* 96  BUN 20 19  CREATININE 1.01* 0.93  CALCIUM 9.6 8.6*  MG 1.7  --    Liver Function Tests: Recent Labs    10/06/21 1938  AST 25  ALT 15  ALKPHOS 52  BILITOT 2.1*  PROT 7.9  ALBUMIN 4.0   No results for input(s): LIPASE, AMYLASE in the last 72 hours. CBC: Recent Labs    10/06/21 1938 10/07/21 0620  WBC 6.5 6.0  NEUTROABS 4.5  --   HGB 12.7 11.6*  HCT 37.2 35.2*  MCV 90.1 93.1  PLT 258 231   Cardiac Enzymes: No results for input(s): CKTOTAL, CKMB, CKMBINDEX, TROPONINI in the last 72 hours. BNP: Invalid input(s): POCBNP D-Dimer: No results for input(s): DDIMER in the last 72 hours. Hemoglobin A1C: No results for input(s): HGBA1C in the last 72 hours. Fasting Lipid Panel: No results for input(s): CHOL, HDL, LDLCALC, TRIG, CHOLHDL, LDLDIRECT in the last 72 hours. Thyroid Function Tests: Recent Labs    10/06/21 1938  TSH 2.464   Anemia Panel: No results for input(s): VITAMINB12, FOLATE, FERRITIN, TIBC, IRON, RETICCTPCT in the last 72 hours.   PHYSICAL EXAM General: Well developed, well nourished, in no acute distress HEENT:  Normocephalic and atramatic Neck:  No JVD.  Lungs: Clear bilaterally to auscultation and percussion. Heart: HRRR . Normal S1 and S2 without gallops or murmurs.  Abdomen: Bowel sounds are positive, abdomen soft and  non-tender  Msk:  Back normal, normal gait. Normal strength and tone for age. Extremities: No clubbing, cyanosis or edema.   Neuro: Alert and oriented X 3. Psych:  Good affect, responds appropriately  TELEMETRY: NSR 60  ASSESSMENT AND PLAN: SVT, doing well on sotolol 80 qd, f/u Tuesday 9 am  Active Problems:   PSVT (paroxysmal supraventricular tachycardia) (HCC)   SVT (supraventricular tachycardia) ([place])    Saira Kramme A, MD, [facility] 10/08/2021 11:16 AM

## 2021-10-08 NOTE — Plan of Care (Signed)
Patient is A/Ox4. Patient did not report any pain, or dizziness during shift. Patients HR stayed in the 45-60s. Vital signs are stable. No further needs at this time. Will continue to monitor.  Problem: Clinical Measurements: Goal: Ability to maintain clinical measurements within normal limits will improve Outcome: Progressing Goal: Will remain free from infection Outcome: Progressing Goal: Diagnostic test results will improve Outcome: Progressing Goal: Respiratory complications will improve Outcome: Progressing Goal: Cardiovascular complication will be avoided Outcome: Progressing   Problem: Pain Managment: Goal: General experience of comfort will improve Outcome: Progressing

## 2021-10-08 NOTE — Progress Notes (Signed)
[place] REFERRAL  Occupational Therapy * Physical Therapy * Speech Therapy         DATE 10/08/21  PATIENT NAME Lori [name]  PATIENT MRN [id number]  DIAGNOSIS/DIAGNOSIS CODE I47.1  DATE OF DISCHARGE 10/08/21  PRIMARY CARE PHYSICIAN PHONE/FAX Dr.Scott - [phone]     Dear Provider Name: [place]  Fax: [phone]  I certify that I have examined this patient and that occupational/physical/speech therapy is necessary on an outpatient basis.    The patient has expressed interest in completing their recommended course of therapy at your location.  Once a formal order from the patient's primary care physician has been obtained, please contact him/her to schedule an appointment for evaluation at your earliest convenience.   [ X ]  Physical Therapy Evaluate and Treat   The patient's primary care physician (listed above) must furnish and be responsible for a formal order such that the recommended services may be furnished while under the primary physician's care, and that the plan of care will be established and reviewed every 30 days (or more often if condition necessitates).

## 2021-10-08 NOTE — Progress Notes (Signed)
D/C paperwork given to the patient. No unanswered questions. IV removed. Tip intact. All belongings with patient. Awaiting son to arrive to transport home via private vehicle.

## 2021-10-09 ENCOUNTER — Telehealth: Payer: Self-pay

## 2021-10-09 NOTE — Telephone Encounter (Signed)
Transition Care Management Unsuccessful Follow-up Telephone Call  Date of discharge and from where: 10/08/2021-[facility]  Attempts:  1st Attempt  Reason for unsuccessful TCM follow-up call:  No answer/busy

## 2021-10-10 ENCOUNTER — Telehealth: Payer: Self-pay | Admitting: Internal Medicine

## 2021-10-10 DIAGNOSIS — I340 Nonrheumatic mitral (valve) insufficiency: Secondary | ICD-10-CM | POA: Diagnosis not present

## 2021-10-10 DIAGNOSIS — I471 Supraventricular tachycardia: Secondary | ICD-10-CM | POA: Diagnosis not present

## 2021-10-10 DIAGNOSIS — I10 Essential (primary) hypertension: Secondary | ICD-10-CM | POA: Diagnosis not present

## 2021-10-10 DIAGNOSIS — E039 Hypothyroidism, unspecified: Secondary | ICD-10-CM | POA: Diagnosis not present

## 2021-10-10 DIAGNOSIS — R002 Palpitations: Secondary | ICD-10-CM | POA: Diagnosis not present

## 2021-10-10 NOTE — Telephone Encounter (Signed)
Patient is calling in to see if she can cancel her lab appointment tomorrow.She stated she was in the hospital this past week and had labs done every day and would like for Dr.Scott to look at those labs instead of coming in tomorrow.Please advise.

## 2021-10-10 NOTE — Telephone Encounter (Signed)
Spoken to patient and instructed she still needed to come in due to them not doing all the same labs PCP wanted to be completed. Patient stated she will comply and come in tomorrow.

## 2021-10-10 NOTE — Telephone Encounter (Signed)
Transition Care Management Unsuccessful Follow-up Telephone Call  Date of discharge and from where:  10/08/21-[facility]  Attempts:  2nd Attempt  Reason for unsuccessful TCM follow-up call:  No answer/busy. Appointment scheduled 10/13/21. Keep all scheduled appointments.

## 2021-10-11 ENCOUNTER — Other Ambulatory Visit (INDEPENDENT_AMBULATORY_CARE_PROVIDER_SITE_OTHER): Payer: Medicare Other

## 2021-10-11 ENCOUNTER — Other Ambulatory Visit: Payer: Self-pay

## 2021-10-11 DIAGNOSIS — E039 Hypothyroidism, unspecified: Secondary | ICD-10-CM

## 2021-10-11 DIAGNOSIS — I10 Essential (primary) hypertension: Secondary | ICD-10-CM

## 2021-10-11 DIAGNOSIS — R739 Hyperglycemia, unspecified: Secondary | ICD-10-CM

## 2021-10-11 LAB — HEPATIC FUNCTION PANEL
ALT: 18 U/L (ref 0–35)
AST: 20 U/L (ref 0–37)
Albumin: 3.8 g/dL (ref 3.5–5.2)
Alkaline Phosphatase: 52 U/L (ref 39–117)
Bilirubin, Direct: 0.2 mg/dL (ref 0.0–0.3)
Total Bilirubin: 1.1 mg/dL (ref 0.2–1.2)
Total Protein: 6.8 g/dL (ref 6.0–8.3)

## 2021-10-11 LAB — BASIC METABOLIC PANEL
BUN: 19 mg/dL (ref 6–23)
CO2: 25 mEq/L (ref 19–32)
Calcium: 9.4 mg/dL (ref 8.4–10.5)
Chloride: 104 mEq/L (ref 96–112)
Creatinine, Ser: 0.95 mg/dL (ref 0.40–1.20)
GFR: 54.82 mL/min — ABNORMAL LOW (ref >60.00)
Glucose, Bld: 102 mg/dL — ABNORMAL HIGH (ref 70–99)
Potassium: 3.9 mEq/L (ref 3.5–5.1)
Sodium: 137 mEq/L (ref 135–145)

## 2021-10-11 LAB — TSH: TSH: 4.39 u[IU]/mL (ref 0.35–5.50)

## 2021-10-11 LAB — LIPID PANEL
Cholesterol: 140 mg/dL (ref 0–200)
HDL: 41.7 mg/dL (ref >39.00)
LDL Cholesterol: 74 mg/dL (ref 0–99)
NonHDL: 98.63
Total CHOL/HDL Ratio: 3
Triglycerides: 122 mg/dL (ref 0.0–149.0)
VLDL: 24.4 mg/dL (ref 0.0–40.0)

## 2021-10-11 LAB — HEMOGLOBIN A1C: Hgb A1c MFr Bld: 5.5 % (ref 4.6–6.5)

## 2021-10-12 NOTE — Telephone Encounter (Signed)
Transition Care Management Unsuccessful Follow-up Telephone Call  Date of discharge and from where:  10/08/21-[facility]  Attempts:  3rd Attempt  Reason for unsuccessful TCM follow-up call:  Left voice message. Appointment scheduled 10/13/21. Keep all scheduled appointments.

## 2021-10-13 ENCOUNTER — Ambulatory Visit (INDEPENDENT_AMBULATORY_CARE_PROVIDER_SITE_OTHER): Payer: Medicare Other | Admitting: Internal Medicine

## 2021-10-13 ENCOUNTER — Ambulatory Visit (INDEPENDENT_AMBULATORY_CARE_PROVIDER_SITE_OTHER): Payer: Medicare Other

## 2021-10-13 ENCOUNTER — Other Ambulatory Visit: Payer: Self-pay

## 2021-10-13 DIAGNOSIS — R739 Hyperglycemia, unspecified: Secondary | ICD-10-CM

## 2021-10-13 DIAGNOSIS — T148XXA Other injury of unspecified body region, initial encounter: Secondary | ICD-10-CM

## 2021-10-13 DIAGNOSIS — K754 Autoimmune hepatitis: Secondary | ICD-10-CM

## 2021-10-13 DIAGNOSIS — I471 Supraventricular tachycardia: Secondary | ICD-10-CM

## 2021-10-13 DIAGNOSIS — E039 Hypothyroidism, unspecified: Secondary | ICD-10-CM

## 2021-10-13 DIAGNOSIS — M25561 Pain in right knee: Secondary | ICD-10-CM

## 2021-10-13 DIAGNOSIS — N1831 Chronic kidney disease, stage 3a: Secondary | ICD-10-CM | POA: Diagnosis not present

## 2021-10-13 DIAGNOSIS — I10 Essential (primary) hypertension: Secondary | ICD-10-CM
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
## 2021-10-18 ENCOUNTER — Other Ambulatory Visit: Payer: Self-pay

## 2021-10-18 ENCOUNTER — Ambulatory Visit (INDEPENDENT_AMBULATORY_CARE_PROVIDER_SITE_OTHER): Payer: Medicare Other | Admitting: Internal Medicine

## 2021-10-18 DIAGNOSIS — I10 Essential (primary) hypertension: Secondary | ICD-10-CM

## 2021-10-18 DIAGNOSIS — T148XXA Other injury of unspecified body region, initial encounter: Secondary | ICD-10-CM | POA: Diagnosis not present

## 2021-10-18 DIAGNOSIS — I471 Supraventricular tachycardia, unspecified: Secondary | ICD-10-CM

## 2021-10-18 DIAGNOSIS — D649 Anemia, unspecified: Secondary | ICD-10-CM | POA: Diagnosis not present

## 2021-10-18 DIAGNOSIS — K754 Autoimmune hepatitis: Secondary | ICD-10-CM

## 2021-10-18 DIAGNOSIS — R7989 Other specified abnormal findings of blood chemistry: Secondary | ICD-10-CM

## 2021-10-18 DIAGNOSIS — R739 Hyperglycemia, unspecified: Secondary | ICD-10-CM

## 2021-10-18 DIAGNOSIS — M25561 Pain in right knee: Secondary | ICD-10-CM

## 2021-10-18 DIAGNOSIS — E039 Hypothyroidism, unspecified: Secondary | ICD-10-CM | POA: Diagnosis not present

## 2021-10-18 DIAGNOSIS — N1831 Chronic kidney disease, stage 3a: Secondary | ICD-10-CM | POA: Diagnosis not present

## 2021-10-18 DIAGNOSIS — E538 Deficiency of other specified B group vitamins: Secondary | ICD-10-CM | POA: Diagnosis not present

## 2021-10-18 NOTE — Progress Notes (Signed)
Patient ID: Lori [name], female   DOB: December 29, 1934, 87 y.o.   MRN: [id number]   Subjective:    Patient ID: Lori [name], female    DOB: 12-21-1934, 87 y.o.   MRN: [id number]  This visit occurred during the SARS-CoV-2 public health emergency.  Safety protocols were in place, including screening questions prior to the visit, additional usage of staff PPE, and extensive cleaning of exam room while observing appropriate contact time as indicated for disinfecting solutions.   Patient here for scheduled follow up  Chief Complaint  Patient presents with   Knee Pain   .   HPI Recently evaluated for hospital follow - SVT.  On sotalol.  Doing well from a cardiac standpoint.  No chest pain or sob reported.  No abdominal pain.  Bowels stable.  No cough or congestion.  Arm is better.  Had IV while in hospital.  Left with large hematoma.  Improved.  No pain.  Continuing with warm compresses. Discussed knee pain.  Discussed referral to ortho.    Past Medical History:  Diagnosis Date   Anemia    iron deficient   Arthritis    Autoimmune hepatitis ([place]) 05/10/2016   Hep C   B12 deficiency    Barrett esophagus 01/2016   Bradycardia    Chronic kidney disease    stage III   Diverticulosis    GERD (gastroesophageal reflux disease)    Hypertension    Hypothyroidism    Ulcer disease    Past Surgical History:  Procedure Laterality Date   CATARACT EXTRACTION W/PHACO Left 08/30/2021   Procedure: CATARACT EXTRACTION PHACO AND INTRAOCULAR LENS PLACEMENT ([place]) LEFT 8.21 01:00.3;  Surgeon: Leandrew Koyanagi, MD;  Location: [place];  Service: Ophthalmology;  Laterality: Left;   CATARACT EXTRACTION W/PHACO Right 09/13/2021   Procedure: CATARACT EXTRACTION PHACO AND INTRAOCULAR LENS PLACEMENT ([place]) RIGHT;  Surgeon: Leandrew Koyanagi, MD;  Location: [place];  Service: Ophthalmology;  Laterality: Right;  8.11 00:59.8   CHOLECYSTECTOMY N/A 06/25/2018   Procedure:  LAPAROSCOPIC CHOLECYSTECTOMY WITH INTRAOPERATIVE CHOLANGIOGRAM;  Surgeon: Robert Bellow, MD;  Location: [facility];  Service: General;  Laterality: N/A;   ESOPHAGOGASTRODUODENOSCOPY (EGD) WITH PROPOFOL N/A 01/30/2016   Procedure: ESOPHAGOGASTRODUODENOSCOPY (EGD) WITH PROPOFOL;  Surgeon: Lollie Sails, MD;  Location[facility] END[facility]COPY;  Service: Endoscopy;  Laterality: N/A;   ESOPHAGOGASTRODUODENOSCOPY (EGD) WITH PROPOFOL N/A 01/14/2018   Procedure: ESOPHAGOGASTRODUODENOSCOPY (EGD) WITH PROPOFOL;  Surgeon: Lollie Sails, MD;  Location: [facility] ENDOSCOPY;  Service: Endoscopy;  Laterality: N/A;   ESOPHAGOGASTRODUODENOSCOPY (EGD) WITH PROPOFOL N/A 05/20/2018   Procedure: ESOPHAGOGASTRODUODENOSCOPY (EGD) WITH PROPOFOL;  Surgeon: Lollie Sails, MD;  Location: [facility] ENDOSCOPY;  Service: Endoscopy;  Laterality: N/A;   ESOPHAGOGASTRODUODENOSCOPY (EGD) WITH PROPOFOL N/A 01/13/2019   Procedure: ESOPHAGOGASTRODUODENOSCOPY (EGD) WITH PROPOFOL;  Surgeon: Lollie Sails, MD;  Location: [facility] ENDOSCOPY;  Service: Endoscopy;  Laterality: N/A;   HERNIA REPAIR     lap hiatal hernia repair  07/08/08   LEFT OOPHORECTOMY  1993   benign ovarian cyst   TUBAL LIGATION  1981   Family History  Problem Relation Age of Onset   Stroke Mother    Breast cancer Neg Hx    Colon cancer Neg Hx    Social History   Socioeconomic History   Marital status: Widowed    Spouse name: Not on file   Number of children: 0   Years of education: Not on file   Highest education level: Not on file  Occupational History  Not on file  Tobacco Use   Smoking status: Never   Smokeless tobacco: Never  Vaping Use   Vaping Use: Never used  Substance and Sexual Activity   Alcohol use: No    Alcohol/week: 0.0 standard drinks   Drug use: No   Sexual activity: Never  Other Topics Concern   Not on file  Social History Narrative   Not on file   Social Determinants of Health   Financial Resource Strain: Low Risk     Difficulty of Paying Living Expenses: Not hard at all  Food Insecurity: No Food Insecurity   Worried About Charity fundraiser in the Last Year: Never true   [place] in the Last Year: Never true  Transportation Needs: No Transportation Needs   Lack of Transportation (Medical): No   Lack of Transportation (Non-Medical): No  Physical Activity: Insufficiently Active   Days of Exercise per Week: 3 days   Minutes of Exercise per Session: 20 min  Stress: No Stress Concern Present   Feeling of Stress : Not at all  Social Connections: Unknown   Frequency of Communication with Friends and Family: More than three times a week   Frequency of Social Gatherings with Friends and Family: More than three times a week   Attends Religious Services: Not on Electrical engineer or Organizations: Not on file   Attends Archivist Meetings: Not on file   Marital Status: Not on file     Review of Systems  Constitutional:  Negative for appetite change and unexpected weight change.  HENT:  Negative for congestion and sinus pressure.   Respiratory:  Negative for cough, chest tightness and shortness of breath.   Cardiovascular:  Negative for chest pain, palpitations and leg swelling.  Gastrointestinal:  Negative for abdominal pain, diarrhea, nausea and vomiting.  Genitourinary:  Negative for difficulty urinating and dysuria.  Musculoskeletal:  Negative for myalgias.       Knee pain as outlined.   Skin:  Negative for color change and rash.  Neurological:  Negative for dizziness, light-headedness and headaches.  Psychiatric/Behavioral:  Negative for agitation and dysphoric mood.       Objective:     BP 130/78   Pulse 66   Temp 97.9 F (36.6 C)   Resp 16   Ht _0  (1.549 m)   Wt 155 lb (70.3 kg)   SpO2 97%   BMI 29.29 kg/m  Wt Readings from Last 3 Encounters:  10/18/21 155 lb (70.3 kg)  10/13/21 153 lb 3.2 oz (69.5 kg)  10/07/21 147 lb 14.9 oz (67.1 kg)    Physical  Exam Vitals reviewed.  Constitutional:      General: She is not in acute distress.    Appearance: Normal appearance.  HENT:     Head: Normocephalic and atraumatic.     Right Ear: External ear normal.     Left Ear: External ear normal.  Eyes:     General: No scleral icterus.       Right eye: No discharge.        Left eye: No discharge.     Conjunctiva/sclera: Conjunctivae normal.  Neck:     Thyroid: No thyromegaly.  Cardiovascular:     Rate and Rhythm: Normal rate and regular rhythm.  Pulmonary:     Effort: No respiratory distress.     Breath sounds: Normal breath sounds. No wheezing.  Abdominal:     General: Bowel sounds  are normal.     Palpations: Abdomen is soft.     Tenderness: There is no abdominal tenderness.  Musculoskeletal:        General: No tenderness.     Cervical back: Neck supple. No tenderness.  Lymphadenopathy:     Cervical: No cervical adenopathy.  Skin:    Findings: No erythema or rash.  Neurological:     Mental Status: She is alert.  Psychiatric:        Mood and Affect: Mood normal.        Behavior: Behavior normal.     Outpatient Encounter Medications as of 10/18/2021  Medication Sig   aspirin 81 MG tablet Take 81 mg by mouth daily.   benazepril (LOTENSIN) 20 MG tablet Take 1 tablet by mouth 2 (two) times daily.   calcitonin, salmon, (MIACALCIN/FORTICAL) 200 UNIT/ACT nasal spray One spray alternating nostrils qod   Calcium-Phosphorus-Vitamin D (CITRACAL +D3 PO) Take 1 tablet by mouth daily.   chlorzoxazone (PARAFON) 500 MG tablet TAKE 1 TABLET BY MOUTH EVERY DAY AT BEDTIME AS NEEDED.  Do not take with ambien.   Cyanocobalamin (B-12) 1000 MCG/ML KIT Inject 1,000 mcg as directed every 30 (thirty) days.    GLUCOSAMINE-CHONDROITIN PO Take 1 tablet by mouth 2 (two) times daily.    levothyroxine (SYNTHROID) 88 MCG tablet TAKE 1 TABLET BY MOUTH EVERY DAY   Magnesium Oxide 400 (240 Mg) MG TABS Take 1 tablet (400 mg total) by mouth 2 (two) times daily.    Mesalamine (ASACOL) 400 MG CPDR DR capsule Take 400 mg by mouth 2 (two) times daily.    pantoprazole (PROTONIX) 40 MG tablet Take 40 mg by mouth 2 (two) times daily.    sotalol (BETAPACE) 80 MG tablet Take 1 tablet (80 mg total) by mouth daily.   Wheat Dextrin (BENEFIBER DRINK MIX) PACK Take 1 each by mouth daily.    zolpidem (AMBIEN CR) 6.25 MG CR tablet TAKE 1 TABLET BY MOUTH EVERY DAY AT BEDTIME AS NEEDED   No facility-administered encounter medications on file as of 10/18/2021.     Lab Results  Component Value Date   WBC 6.0 10/07/2021   HGB 11.6 (L) 10/07/2021   HCT 35.2 (L) 10/07/2021   PLT 231 10/07/2021   GLUCOSE 102 (H) 10/11/2021   CHOL 140 10/11/2021   TRIG 122.0 10/11/2021   HDL 41.70 10/11/2021   LDLCALC 74 10/11/2021   ALT 18 10/11/2021   AST 20 10/11/2021   NA 137 10/11/2021   K 3.9 10/11/2021   CL 104 10/11/2021   CREATININE 0.95 10/11/2021   BUN 19 10/11/2021   CO2 25 10/11/2021   TSH 4.39 10/11/2021   INR 1.0 06/07/2020   HGBA1C 5.5 10/11/2021    ECHOCARDIOGRAM COMPLETE  Result Date: 10/07/2021    ECHOCARDIOGRAM REPORT   Patient Name:   ELOISA CHOKSHI Zaman Date of Exam: 10/07/2021 Medical Rec #:  [id number]             Height:       60.0 in Accession #:    [id number]            Weight:       148.0 lb Date of Birth:  April 26, 1934             BSA:          1.642 m Patient Age:    56 years              BP:  136/72 mmHg Patient Gender: F                     HR:           60 bpm. Exam Location:  [facility] Procedure: 2D Echo and Strain Analysis Indications:     Syncope R55  History:         Patient has no prior history of Echocardiogram examinations.  Sonographer:     Kathlen Brunswick RDCS Referring Phys:  [id number] [place] Diagnosing Phys: Neoma Laming  Sonographer Comments: Global longitudinal strain was attempted. IMPRESSIONS  1. Left ventricular ejection fraction, by estimation, is 55 to 60%. The left ventricle has normal function. The left ventricle has no  regional wall motion abnormalities. There is mild concentric left ventricular hypertrophy. Left ventricular diastolic parameters are consistent with Grade I diastolic dysfunction (impaired relaxation).  2. Right ventricular systolic function is normal. The right ventricular size is normal.  3. The mitral valve is normal in structure. Mild to moderate mitral valve regurgitation. No evidence of mitral stenosis.  4. The aortic valve is normal in structure. Aortic valve regurgitation is not visualized. No aortic stenosis is present.  5. The inferior vena cava is normal in size with greater than 50% respiratory variability, suggesting right atrial pressure of 3 mmHg. FINDINGS  Left Ventricle: Left ventricular ejection fraction, by estimation, is 55 to 60%. The left ventricle has normal function. The left ventricle has no regional wall motion abnormalities. The left ventricular internal cavity size was normal in size. There is  mild concentric left ventricular hypertrophy. Left ventricular diastolic parameters are consistent with Grade I diastolic dysfunction (impaired relaxation). Right Ventricle: The right ventricular size is normal. No increase in right ventricular wall thickness. Right ventricular systolic function is normal. Left Atrium: Left atrial size was normal in size. Right Atrium: Right atrial size was normal in size. Pericardium: There is no evidence of pericardial effusion. Mitral Valve: The mitral valve is normal in structure. Mild to moderate mitral valve regurgitation. No evidence of mitral valve stenosis. Tricuspid Valve: The tricuspid valve is normal in structure. Tricuspid valve regurgitation is trivial. No evidence of tricuspid stenosis. Aortic Valve: The aortic valve is normal in structure. Aortic valve regurgitation is not visualized. No aortic stenosis is present. Aortic valve peak gradient measures 6.0 mmHg. Pulmonic Valve: The pulmonic valve was normal in structure. Pulmonic valve regurgitation  is not visualized. No evidence of pulmonic stenosis. Aorta: The aortic root is normal in size and structure. Venous: The inferior vena cava is normal in size with greater than 50% respiratory variability, suggesting right atrial pressure of 3 mmHg. IAS/Shunts: No atrial level shunt detected by color flow Doppler.  LEFT VENTRICLE PLAX 2D LVIDd:         4.73 cm   Diastology LVIDs:         3.31 cm   LV e' medial:    5.98 cm/s LV PW:         1.13 cm   LV E/e' medial:  10.7 LV IVS:        1.08 cm   LV e' lateral:   7.94 cm/s LVOT diam:     1.80 cm   LV E/e' lateral: 8.0 LV SV:         57 LV SV Index:   35 LVOT Area:     2.54 cm  RIGHT VENTRICLE RV Basal diam:  2.82 cm RV S prime:     15.20 cm/s  TAPSE (M-mode): 1.6 cm LEFT ATRIUM             Index        RIGHT ATRIUM           Index LA diam:        4.60 cm 2.80 cm/m   RA Area:     13.40 cm LA Vol (A2C):   40.1 ml 24.42 ml/m  RA Volume:   35.90 ml  21.86 ml/m LA Vol (A4C):   38.8 ml 23.62 ml/m LA Biplane Vol: 40.0 ml 24.35 ml/m  AORTIC VALVE                 PULMONIC VALVE AV Area (Vmax): 2.03 cm     PV Vmax:       0.58 m/s AV Vmax:        122.00 cm/s  PV Peak grad:  1.4 mmHg AV Peak Grad:   6.0 mmHg LVOT Vmax:      97.30 cm/s LVOT Vmean:     63.200 cm/s LVOT VTI:       0.225 m  AORTA Ao Root diam: 2.90 cm Ao Asc diam:  3.00 cm MITRAL VALVE               TRICUSPID VALVE MV Area (PHT): 2.67 cm    TV Peak grad:   18.8 mmHg MV Decel Time: 284 msec    TV Vmax:        2.17 m/s MV E velocity: 63.80 cm/s MV A velocity: 70.70 cm/s  SHUNTS MV E/A ratio:  0.90        Systemic VTI:  0.22 m                            Systemic Diam: 1.80 cm Neoma Laming Electronically signed by Neoma Laming Signature Date/Time: 10/07/2021/11:24:47 AM    Final        Assessment & Plan:   Problem List Items Addressed This Visit     Abnormal liver function tests    Followed by GI.  Stable.  Follow liver function tests.       Anemia    Follow cbc.       Autoimmune hepatitis ([place])     Followed by GI.  Follow liver function tests.       B12 deficiency    Continue B12 injections.       CKD (chronic kidney disease) stage 3, GFR 30-59 ml/min ([facility])    Avoid antiinflammatories.  Stay hydrated.  Follow metabolic panel. Recent GFR 54 (10/11/21)       Hematoma    Improved.  No pain.  Follow.        Hyperglycemia    Low carb diet and exercise.  Follow met b and a1c.       Hypertension    On benazepril 62m q day.  Recently started sotalol.  Blood pressure doing well.  Continue current medications.  Follow metabolic panel.       Hypothyroidism    On thyroid replacement.  Follow tsh.       Right knee pain    Persistent knee pain.  Xray with degenerative changes and joint effusion.  Refer to ortho for further evaluation.       SVT [place]ricular [place]cardia) [place]    Recently diagnosed.  Continue sotalol.  No increased heart rate or palpitations reported currently.         CEinar Pheasant  MD

## 2021-10-19 DIAGNOSIS — I471 Supraventricular tachycardia: Secondary | ICD-10-CM | POA: Diagnosis not present

## 2021-10-22 ENCOUNTER — Encounter: Payer: Self-pay | Admitting: Internal Medicine

## 2021-10-22 DIAGNOSIS — T148XXA Other injury of unspecified body region, initial encounter: Secondary | ICD-10-CM | POA: Insufficient documentation

## 2021-10-22 NOTE — Assessment & Plan Note (Signed)
Recently diagnosed.  Continue sotalol.  No increased heart rate or palpitations reported currently.  Has f/u planned with cardiology 10/10/21.

## 2021-10-22 NOTE — Assessment & Plan Note (Signed)
Followed by GI.  Follow liver function tests.  

## 2021-10-22 NOTE — Assessment & Plan Note (Signed)
Persistent.  Check xray.  Further w/up pending results.  Recent fall.

## 2021-10-22 NOTE — Assessment & Plan Note (Signed)
Low carb diet and exercise.  Follow met b and a1c.  

## 2021-10-22 NOTE — Assessment & Plan Note (Signed)
Avoid antiinflammatories.  Stay hydrated.  Follow metabolic panel. Recent GFR 54 (10/11/21)

## 2021-10-22 NOTE — Assessment & Plan Note (Signed)
On thyroid replacement.  Follow tsh.  

## 2021-10-22 NOTE — Assessment & Plan Note (Signed)
On benazepril 40mg  q day.  Recently started sotalol.  Blood pressure doing well.  Continue current medications.  Follow metabolic panel.

## 2021-10-22 NOTE — Assessment & Plan Note (Signed)
Hematoma - IV site (arm). No pain.   Warm compresses. Follow closely.  Get her back in next week to reassess.

## 2021-10-23 DIAGNOSIS — R55 Syncope and collapse: Secondary | ICD-10-CM | POA: Diagnosis not present

## 2021-10-23 DIAGNOSIS — I340 Nonrheumatic mitral (valve) insufficiency: Secondary | ICD-10-CM | POA: Diagnosis not present

## 2021-10-23 DIAGNOSIS — I10 Essential (primary) hypertension: Secondary | ICD-10-CM | POA: Diagnosis not present

## 2021-10-23 DIAGNOSIS — R002 Palpitations: Secondary | ICD-10-CM | POA: Diagnosis not present

## 2021-10-23 DIAGNOSIS — R0602 Shortness of breath: Secondary | ICD-10-CM | POA: Diagnosis not present

## 2021-10-24 ENCOUNTER — Other Ambulatory Visit: Payer: Self-pay

## 2021-10-24 ENCOUNTER — Ambulatory Visit (INDEPENDENT_AMBULATORY_CARE_PROVIDER_SITE_OTHER): Payer: Medicare Other

## 2021-10-24 DIAGNOSIS — E538 Deficiency of other specified B group vitamins: Secondary | ICD-10-CM | POA: Diagnosis not present

## 2021-10-24 MED ORDER — CYANOCOBALAMIN 1000 MCG/ML IJ SOLN
1000.0000 ug | Freq: Once | INTRAMUSCULAR | Status: AC
  Administered 2021-10-24: 1000 ug via INTRAMUSCULAR

## 2021-10-24 NOTE — Progress Notes (Signed)
Patient presented for B 12 injection to left deltoid, patient voiced no concerns nor showed any signs of distress during injection. 

## 2021-10-29 ENCOUNTER — Encounter: Payer: Self-pay | Admitting: Internal Medicine

## 2021-10-29 NOTE — Assessment & Plan Note (Signed)
Followed by GI.  Stable.  Follow liver function tests.  

## 2021-10-29 NOTE — Assessment & Plan Note (Signed)
Improved.  No pain.  Follow.

## 2021-10-29 NOTE — Assessment & Plan Note (Signed)
Low carb diet and exercise.  Follow met b and a1c.  

## 2021-10-29 NOTE — Assessment & Plan Note (Signed)
Recently diagnosed.  Continue sotalol.  No increased heart rate or palpitations reported currently.

## 2021-10-29 NOTE — Assessment & Plan Note (Signed)
Continue B12 injections.   

## 2021-10-29 NOTE — Assessment & Plan Note (Signed)
Persistent knee pain.  Xray with degenerative changes and joint effusion.  Refer to ortho for further evaluation.

## 2021-10-29 NOTE — Assessment & Plan Note (Signed)
Followed by GI.  Follow liver function tests.  

## 2021-10-29 NOTE — Assessment & Plan Note (Signed)
Avoid antiinflammatories.  Stay hydrated.  Follow metabolic panel. Recent GFR 54 (10/11/21)

## 2021-10-29 NOTE — Assessment & Plan Note (Signed)
On thyroid replacement.  Follow tsh.  

## 2021-10-29 NOTE — Assessment & Plan Note (Signed)
On benazepril 40mg  q day.  Recently started sotalol.  Blood pressure doing well.  Continue current medications.  Follow metabolic panel.

## 2021-10-29 NOTE — Assessment & Plan Note (Signed)
Follow cbc.  

## 2021-11-02 ENCOUNTER — Telehealth: Payer: Self-pay | Admitting: Internal Medicine

## 2021-11-02 DIAGNOSIS — M25561 Pain in right knee: Secondary | ICD-10-CM

## 2021-11-02 NOTE — Telephone Encounter (Signed)
Pt called in wondering if Dr. Nicki Reaper place a referral order for orthopedic. Pt would like call back.

## 2021-11-02 NOTE — Telephone Encounter (Signed)
Left message to call office

## 2021-11-03 NOTE — Telephone Encounter (Signed)
Patient say she saw you on the 71 th and PCP advised you would place a referral and she was to call  back if not she had not heard anything, she say it is the right knee is using ace bandage and tylenol but tries not to take to much tylenol because of her stomach she rates the pain currently at a 5  but was a 10 before using the ace bandage.

## 2021-11-03 NOTE — Telephone Encounter (Signed)
Patient returned office phone call from [place].

## 2021-11-04 NOTE — Telephone Encounter (Signed)
The order has been placed for referral to ortho.  Someone should be contacting her with an appt date and time.  Sorry for the delay.

## 2021-11-04 NOTE — Telephone Encounter (Signed)
Order placed for referral to orho.

## 2021-11-06 NOTE — Telephone Encounter (Signed)
Patient is aware 

## 2021-11-14 DIAGNOSIS — G8929 Other chronic pain: Secondary | ICD-10-CM | POA: Diagnosis not present

## 2021-11-14 DIAGNOSIS — M25561 Pain in right knee: Secondary | ICD-10-CM | POA: Diagnosis not present

## 2021-11-14 DIAGNOSIS — M1711 Unilateral primary osteoarthritis, right knee: Secondary | ICD-10-CM | POA: Diagnosis not present

## 2021-11-24 ENCOUNTER — Other Ambulatory Visit: Payer: Self-pay | Admitting: Internal Medicine

## 2021-11-24 NOTE — Telephone Encounter (Signed)
LOV: 10/18/21 NOV: 01/24/22

## 2021-11-28 ENCOUNTER — Ambulatory Visit (INDEPENDENT_AMBULATORY_CARE_PROVIDER_SITE_OTHER): Payer: Medicare Other

## 2021-11-28 ENCOUNTER — Other Ambulatory Visit: Payer: Self-pay

## 2021-11-28 DIAGNOSIS — E538 Deficiency of other specified B group vitamins: Secondary | ICD-10-CM

## 2021-11-28 MED ORDER — CYANOCOBALAMIN 1000 MCG/ML IJ SOLN
1000.0000 ug | Freq: Once | INTRAMUSCULAR | Status: AC
  Administered 2021-11-28: 1000 ug via INTRAMUSCULAR

## 2021-11-28 NOTE — Progress Notes (Signed)
Patient presented for B 12 injection to left deltoid, patient voiced no concerns nor showed any signs of distress during injection. 

## 2021-12-11 ENCOUNTER — Other Ambulatory Visit: Payer: Self-pay | Admitting: Internal Medicine

## 2021-12-11 DIAGNOSIS — R252 Cramp and spasm: Secondary | ICD-10-CM

## 2021-12-27 DIAGNOSIS — R001 Bradycardia, unspecified: Secondary | ICD-10-CM | POA: Diagnosis not present

## 2021-12-27 DIAGNOSIS — I491 Atrial premature depolarization: Secondary | ICD-10-CM | POA: Diagnosis not present

## 2021-12-27 DIAGNOSIS — I10 Essential (primary) hypertension: Secondary | ICD-10-CM | POA: Diagnosis not present

## 2021-12-27 DIAGNOSIS — I471 Supraventricular tachycardia: Secondary | ICD-10-CM | POA: Diagnosis not present

## 2021-12-29 ENCOUNTER — Other Ambulatory Visit: Payer: Self-pay

## 2021-12-29 ENCOUNTER — Ambulatory Visit (INDEPENDENT_AMBULATORY_CARE_PROVIDER_SITE_OTHER): Payer: Medicare Other

## 2021-12-29 DIAGNOSIS — E538 Deficiency of other specified B group vitamins: Secondary | ICD-10-CM

## 2021-12-29 MED ORDER — CYANOCOBALAMIN 1000 MCG/ML IJ SOLN
1000.0000 ug | Freq: Once | INTRAMUSCULAR | Status: AC
Start: 2021-12-29 — End: 2021-12-29
  Administered 2021-12-29: 11:00:00 1000 ug via INTRAMUSCULAR

## 2021-12-29 NOTE — Progress Notes (Signed)
Lori [name] presents today for injection per MD orders. B12 injection administered IM in right Upper Arm. Administration without incident. Patient tolerated well.  Toye Rouillard,cma

## 2022-01-24 ENCOUNTER — Encounter: Payer: Self-pay | Admitting: Internal Medicine

## 2022-01-24 ENCOUNTER — Ambulatory Visit (INDEPENDENT_AMBULATORY_CARE_PROVIDER_SITE_OTHER): Payer: Medicare Other | Admitting: Internal Medicine

## 2022-01-24 ENCOUNTER — Other Ambulatory Visit: Payer: Self-pay

## 2022-01-24 VITALS — BP 130/72 | HR 60 | Temp 98.0°F | Resp 16 | Ht 61.0 in | Wt 143.0 lb

## 2022-01-24 DIAGNOSIS — K754 Autoimmune hepatitis: Secondary | ICD-10-CM | POA: Diagnosis not present

## 2022-01-24 DIAGNOSIS — I10 Essential (primary) hypertension: Secondary | ICD-10-CM

## 2022-01-24 DIAGNOSIS — R739 Hyperglycemia, unspecified: Secondary | ICD-10-CM

## 2022-01-24 DIAGNOSIS — D649 Anemia, unspecified: Secondary | ICD-10-CM | POA: Diagnosis not present

## 2022-01-24 DIAGNOSIS — E039 Hypothyroidism, unspecified: Secondary | ICD-10-CM

## 2022-01-24 DIAGNOSIS — E538 Deficiency of other specified B group vitamins: Secondary | ICD-10-CM | POA: Diagnosis not present

## 2022-01-24 DIAGNOSIS — I471 Supraventricular tachycardia: Secondary | ICD-10-CM

## 2022-01-24 DIAGNOSIS — I491 Atrial premature depolarization: Secondary | ICD-10-CM | POA: Diagnosis not present

## 2022-01-24 DIAGNOSIS — Z13220 Encounter for screening for lipoid disorders: Secondary | ICD-10-CM

## 2022-01-24 DIAGNOSIS — N1831 Chronic kidney disease, stage 3a: Secondary | ICD-10-CM

## 2022-01-24 DIAGNOSIS — R001 Bradycardia, unspecified: Secondary | ICD-10-CM | POA: Diagnosis not present

## 2022-01-24 LAB — LIPID PANEL
Cholesterol: 167 mg/dL (ref 0–200)
HDL: 45.5 mg/dL (ref >39.00)
LDL Cholesterol: 98 mg/dL (ref 0–99)
NonHDL: 121.16
Total CHOL/HDL Ratio: 4
Triglycerides: 117 mg/dL (ref 0.0–149.0)
VLDL: 23.4 mg/dL (ref 0.0–40.0)

## 2022-01-24 LAB — HEPATIC FUNCTION PANEL
ALT: 11 U/L (ref 0–35)
AST: 16 U/L (ref 0–37)
Albumin: 4.3 g/dL (ref 3.5–5.2)
Alkaline Phosphatase: 50 U/L (ref 39–117)
Bilirubin, Direct: 0.2 mg/dL (ref 0.0–0.3)
Total Bilirubin: 1 mg/dL (ref 0.2–1.2)
Total Protein: 7.6 g/dL (ref 6.0–8.3)

## 2022-01-24 LAB — CBC WITH DIFFERENTIAL/PLATELET
Basophils Absolute: 0.1 10*3/uL (ref 0.0–0.1)
Basophils Relative: 2 % (ref 0.0–3.0)
Eosinophils Absolute: 0.1 10*3/uL (ref 0.0–0.7)
Eosinophils Relative: 2.9 % (ref 0.0–5.0)
HCT: 38.6 % (ref 36.0–46.0)
Hemoglobin: 12.7 g/dL (ref 12.0–15.0)
Lymphocytes Relative: 21.8 % (ref 12.0–46.0)
Lymphs Abs: 1.1 10*3/uL (ref 0.7–4.0)
MCHC: 32.9 g/dL (ref 30.0–36.0)
MCV: 88.9 fl (ref 78.0–100.0)
Monocytes Absolute: 0.5 10*3/uL (ref 0.1–1.0)
Monocytes Relative: 10.1 % (ref 3.0–12.0)
Neutro Abs: 3.1 10*3/uL (ref 1.4–7.7)
Neutrophils Relative %: 63.2 % (ref 43.0–77.0)
Platelets: 236 10*3/uL (ref 150.0–400.0)
RBC: 4.35 Mil/uL (ref 3.87–5.11)
RDW: 14.1 % (ref 11.5–15.5)
WBC: 4.9 10*3/uL (ref 4.0–10.5)

## 2022-01-24 LAB — BASIC METABOLIC PANEL
BUN: 29 mg/dL — ABNORMAL HIGH (ref 6–23)
CO2: 30 mEq/L (ref 19–32)
Calcium: 10.4 mg/dL (ref 8.4–10.5)
Chloride: 104 mEq/L (ref 96–112)
Creatinine, Ser: 1.07 mg/dL (ref 0.40–1.20)
GFR: 47.43 mL/min — ABNORMAL LOW (ref >60.00)
Glucose, Bld: 98 mg/dL (ref 70–99)
Potassium: 4.5 mEq/L (ref 3.5–5.1)
Sodium: 140 mEq/L (ref 135–145)

## 2022-01-24 LAB — VITAMIN B12: Vitamin B-12: 600 pg/mL (ref 211–911)

## 2022-01-24 LAB — FERRITIN: Ferritin: 50.7 ng/mL (ref 10.0–291.0)

## 2022-01-24 LAB — HEMOGLOBIN A1C: Hgb A1c MFr Bld: 6 % (ref 4.6–6.5)

## 2022-01-24 NOTE — Progress Notes (Signed)
Patient ID: Lori [name], female   DOB: 1934-05-29, 88 y.o.   MRN: [id number]   Subjective:    Patient ID: Lori [name], female    DOB: 24-Jan-1934, 88 y.o.   MRN: [id number]  This visit occurred during the SARS-CoV-2 public health emergency.  Safety protocols were in place, including screening questions prior to the visit, additional usage of staff PPE, and extensive cleaning of exam room while observing appropriate contact time as indicated for disinfecting solutions.   Patient here for a scheduled follow up.    Chief Complaint  Patient presents with   Hypertension   .   HPI Recently evaluated by cardiology 12/27/21.  Sotalol changed to 40 bid.  Hctz added.  Remains on these medications.  No increased heart rate or palpitations reported. Has f/u with cardiology this pm.  Some decreased energy.  Overall stable.  No increased cough or congestion.  Weight is down.  Had increased stomach cramping previously.  Resolved. Is eating.  No vomiting.  Bowels stable.      Past Medical History:  Diagnosis Date   Anemia    iron deficient   Arthritis    Autoimmune hepatitis ([place]) 05/10/2016   Hep C   B12 deficiency    Barrett esophagus 01/2016   Bradycardia    Chronic kidney disease    stage III   Diverticulosis    GERD (gastroesophageal reflux disease)    Hypertension    Hypothyroidism    Ulcer disease    Past Surgical History:  Procedure Laterality Date   CATARACT EXTRACTION W/PHACO Left 08/30/2021   Procedure: CATARACT EXTRACTION PHACO AND INTRAOCULAR LENS PLACEMENT ([place]) LEFT 8.21 01:00.3;  Surgeon: Leandrew Koyanagi, MD;  Location: [place];  Service: Ophthalmology;  Laterality: Left;   CATARACT EXTRACTION W/PHACO Right 09/13/2021   Procedure: CATARACT EXTRACTION PHACO AND INTRAOCULAR LENS PLACEMENT ([place]) RIGHT;  Surgeon: Leandrew Koyanagi, MD;  Location: [place];  Service: Ophthalmology;  Laterality: Right;  8.11 00:59.8   CHOLECYSTECTOMY  N/A 06/25/2018   Procedure: LAPAROSCOPIC CHOLECYSTECTOMY WITH INTRAOPERATIVE CHOLANGIOGRAM;  Surgeon: Robert Bellow, MD;  Location: [facility];  Service: General;  Laterality: N/A;   ESOPHAGOGASTRODUODENOSCOPY (EGD) WITH PROPOFOL N/A 01/30/2016   Procedure: ESOPHAGOGASTRODUODENOSCOPY (EGD) WITH PROPOFOL;  Surgeon: Lollie Sails, MD;  Location[facility] ENDOSCOPY;  Service: Endoscopy;  Laterality: N/A;   ESOPHAGOGASTRODUODENOSCOPY (EGD) WITH PROPOFOL N/A 01/14/2018   Procedure: ESOPHAGOGASTRODUODENOSCOPY (EGD) WITH PROPOFOL;  Surgeon: Lollie Sails, MD;  Location: [facility] ENDOSCOPY;  Service: Endoscopy;  Laterality: N/A;   ESOPHAGOGASTRODUODENOSCOPY (EGD) WITH PROPOFOL N/A 05/20/2018   Procedure: ESOPHAGOGASTRODUODENOSCOPY (EGD) WITH PROPOFOL;  Surgeon: Lollie Sails, MD;  Location: [facility] ENDOSCOPY;  Service: Endoscopy;  Laterality: N/A;   ESOPHAGOGASTRODUODENOSCOPY (EGD) WITH PROPOFOL N/A 01/13/2019   Procedure: ESOPHAGOGASTRODUODENOSCOPY (EGD) WITH PROPOFOL;  Surgeon: Lollie Sails, MD;  Location: [facility] ENDOSCOPY;  Service: Endoscopy;  Laterality: N/A;   HERNIA REPAIR     lap hiatal hernia repair  07/08/08   LEFT OOPHORECTOMY  1993   benign ovarian cyst   TUBAL LIGATION  1981   Family History  Problem Relation Age of Onset   Stroke Mother    Breast cancer Neg Hx    Colon cancer Neg Hx    Social History   Socioeconomic History   Marital status: Widowed    Spouse name: Not on file   Number of children: 0   Years of education: Not on file   Highest education level: Not on file  Occupational  History   Not on file  Tobacco Use   Smoking status: Never   Smokeless tobacco: Never  Vaping Use   Vaping Use: Never used  Substance and Sexual Activity   Alcohol use: No    Alcohol/week: 0.0 standard drinks   Drug use: No   Sexual activity: Never  Other Topics Concern   Not on file  Social History Narrative   Not on file   Social Determinants of Health   Financial Resource  Strain: Low Risk    Difficulty of Paying Living Expenses: Not hard at all  Food Insecurity: No Food Insecurity   Worried About Charity fundraiser in the Last Year: Never true   [place] in the Last Year: Never true  Transportation Needs: No Transportation Needs   Lack of Transportation (Medical): No   Lack of Transportation (Non-Medical): No  Physical Activity: Insufficiently Active   Days of Exercise per Week: 3 days   Minutes of Exercise per Session: 20 min  Stress: No Stress Concern Present   Feeling of Stress : Not at all  Social Connections: Unknown   Frequency of Communication with Friends and Family: More than three times a week   Frequency of Social Gatherings with Friends and Family: More than three times a week   Attends Religious Services: Not on Electrical engineer or Organizations: Not on file   Attends Archivist Meetings: Not on file   Marital Status: Not on file     Review of Systems  Constitutional:  Positive for fatigue.       Weight loss as outlined.   HENT:  Negative for congestion and sinus pressure.   Respiratory:  Negative for cough, chest tightness and shortness of breath.   Cardiovascular:  Negative for chest pain, palpitations and leg swelling.  Gastrointestinal:  Negative for nausea and vomiting.       Previous abdominal cramping as outlined.   Genitourinary:  Negative for difficulty urinating and dysuria.  Musculoskeletal:  Negative for joint swelling and myalgias.  Skin:  Negative for color change and rash.  Neurological:  Negative for dizziness, light-headedness and headaches.  Psychiatric/Behavioral:  Negative for agitation and dysphoric mood.       Objective:     BP 130/72    Pulse 60    Temp 98 F (36.7 C)    Resp 16    Ht '5\' 1"'  (1.549 m)    Wt 143 lb (64.9 kg)    SpO2 98%    BMI 27.02 kg/m  Wt Readings from Last 3 Encounters:  01/24/22 143 lb (64.9 kg)  10/18/21 155 lb (70.3 kg)  10/13/21 153 lb 3.2 oz (69.5  kg)    Physical Exam Vitals reviewed.  Constitutional:      General: She is not in acute distress.    Appearance: Normal appearance.  HENT:     Head: Normocephalic and atraumatic.     Right Ear: External ear normal.     Left Ear: External ear normal.  Eyes:     General: No scleral icterus.       Right eye: No discharge.        Left eye: No discharge.     Conjunctiva/sclera: Conjunctivae normal.  Neck:     Thyroid: No thyromegaly.  Cardiovascular:     Rate and Rhythm: Normal rate and regular rhythm.  Pulmonary:     Effort: No respiratory distress.     Breath  sounds: Normal breath sounds. No wheezing.  Abdominal:     General: Bowel sounds are normal.     Palpations: Abdomen is soft.     Tenderness: There is no abdominal tenderness.  Musculoskeletal:        General: No swelling or tenderness.     Cervical back: Neck supple. No tenderness.  Lymphadenopathy:     Cervical: No cervical adenopathy.  Skin:    Findings: No erythema or rash.  Neurological:     Mental Status: She is alert.  Psychiatric:        Mood and Affect: Mood normal.        Behavior: Behavior normal.     Outpatient Encounter Medications as of 01/24/2022  Medication Sig   sotalol (BETAPACE) 80 MG tablet Take 0.5 mg by mouth 2 (two) times daily.   aspirin 81 MG tablet Take 81 mg by mouth daily.   benazepril (LOTENSIN) 20 MG tablet Take 1 tablet by mouth 2 (two) times daily.   calcitonin, salmon, (MIACALCIN/FORTICAL) 200 UNIT/ACT nasal spray One spray alternating nostrils qod   Calcium-Phosphorus-Vitamin D (CITRACAL +D3 PO) Take 1 tablet by mouth daily.   chlorzoxazone (PARAFON) 500 MG tablet TAKE 1 TABLET BY MOUTH EVERY DAY AT BEDTIME AS NEEDED. DO NOT TAKE WITH AMBIEN.   Cyanocobalamin (B-12) 1000 MCG/ML KIT Inject 1,000 mcg as directed every 30 (thirty) days.    GLUCOSAMINE-CHONDROITIN PO Take 1 tablet by mouth 2 (two) times daily.    levothyroxine (SYNTHROID) 88 MCG tablet TAKE 1 TABLET BY MOUTH EVERY  DAY   Magnesium Oxide 400 (240 Mg) MG TABS Take 1 tablet (400 mg total) by mouth 2 (two) times daily.   Mesalamine (ASACOL) 400 MG CPDR DR capsule Take 400 mg by mouth 2 (two) times daily.    pantoprazole (PROTONIX) 40 MG tablet Take 40 mg by mouth 2 (two) times daily.    Wheat Dextrin (BENEFIBER DRINK MIX) PACK Take 1 each by mouth daily.    zolpidem (AMBIEN CR) 6.25 MG CR tablet TAKE 1 TABLET BY MOUTH EVERY DAY AT BEDTIME AS NEEDED   [DISCONTINUED] sotalol (BETAPACE) 80 MG tablet Take 1 tablet (80 mg total) by mouth daily.   No facility-administered encounter medications on file as of 01/24/2022.     Lab Results  Component Value Date   WBC 4.9 01/24/2022   HGB 12.7 01/24/2022   HCT 38.6 01/24/2022   PLT 236.0 01/24/2022   GLUCOSE 98 01/24/2022   CHOL 167 01/24/2022   TRIG 117.0 01/24/2022   HDL 45.50 01/24/2022   LDLCALC 98 01/24/2022   ALT 11 01/24/2022   AST 16 01/24/2022   NA 140 01/24/2022   K 4.5 01/24/2022   CL 104 01/24/2022   CREATININE 1.07 01/24/2022   BUN 29 (H) 01/24/2022   CO2 30 01/24/2022   TSH 4.39 10/11/2021   INR 1.0 06/07/2020   HGBA1C 6.0 01/24/2022    ECHOCARDIOGRAM COMPLETE  Result Date: 10/07/2021    ECHOCARDIOGRAM REPORT   Patient Name:   ELISABETH STROM Albee Date of Exam: 10/07/2021 Medical Rec #:  [id number]             Height:       60.0 in Accession #:    [id number]            Weight:       148.0 lb Date of Birth:  1934/10/15             BSA:  1.642 m Patient Age:    63 years              BP:           136/72 mmHg Patient Gender: F                     HR:           60 bpm. Exam Location:  [facility] Procedure: 2D Echo and Strain Analysis Indications:     Syncope R55  History:         Patient has no prior history of Echocardiogram examinations.  Sonographer:     Kathlen Brunswick RDCS Referring Phys:  [id number] [place] Diagnosing Phys: Neoma Laming  Sonographer Comments: Global longitudinal strain was attempted. IMPRESSIONS  1. Left ventricular  ejection fraction, by estimation, is 55 to 60%. The left ventricle has normal function. The left ventricle has no regional wall motion abnormalities. There is mild concentric left ventricular hypertrophy. Left ventricular diastolic parameters are consistent with Grade I diastolic dysfunction (impaired relaxation).  2. Right ventricular systolic function is normal. The right ventricular size is normal.  3. The mitral valve is normal in structure. Mild to moderate mitral valve regurgitation. No evidence of mitral stenosis.  4. The aortic valve is normal in structure. Aortic valve regurgitation is not visualized. No aortic stenosis is present.  5. The inferior vena cava is normal in size with greater than 50% respiratory variability, suggesting right atrial pressure of 3 mmHg. FINDINGS  Left Ventricle: Left ventricular ejection fraction, by estimation, is 55 to 60%. The left ventricle has normal function. The left ventricle has no regional wall motion abnormalities. The left ventricular internal cavity size was normal in size. There is  mild concentric left ventricular hypertrophy. Left ventricular diastolic parameters are consistent with Grade I diastolic dysfunction (impaired relaxation). Right Ventricle: The right ventricular size is normal. No increase in right ventricular wall thickness. Right ventricular systolic function is normal. Left Atrium: Left atrial size was normal in size. Right Atrium: Right atrial size was normal in size. Pericardium: There is no evidence of pericardial effusion. Mitral Valve: The mitral valve is normal in structure. Mild to moderate mitral valve regurgitation. No evidence of mitral valve stenosis. Tricuspid Valve: The tricuspid valve is normal in structure. Tricuspid valve regurgitation is trivial. No evidence of tricuspid stenosis. Aortic Valve: The aortic valve is normal in structure. Aortic valve regurgitation is not visualized. No aortic stenosis is present. Aortic valve peak  gradient measures 6.0 mmHg. Pulmonic Valve: The pulmonic valve was normal in structure. Pulmonic valve regurgitation is not visualized. No evidence of pulmonic stenosis. Aorta: The aortic root is normal in size and structure. Venous: The inferior vena cava is normal in size with greater than 50% respiratory variability, suggesting right atrial pressure of 3 mmHg. IAS/Shunts: No atrial level shunt detected by color flow Doppler.  LEFT VENTRICLE PLAX 2D LVIDd:         4.73 cm   Diastology LVIDs:         3.31 cm   LV e' medial:    5.98 cm/s LV PW:         1.13 cm   LV E/e' medial:  10.7 LV IVS:        1.08 cm   LV e' lateral:   7.94 cm/s LVOT diam:     1.80 cm   LV E/e' lateral: 8.0 LV SV:  57 LV SV Index:   35 LVOT Area:     2.54 cm  RIGHT VENTRICLE RV Basal diam:  2.82 cm RV S prime:     15.20 cm/s TAPSE (M-mode): 1.6 cm LEFT ATRIUM             Index        RIGHT ATRIUM           Index LA diam:        4.60 cm 2.80 cm/m   RA Area:     13.40 cm LA Vol (A2C):   40.1 ml 24.42 ml/m  RA Volume:   35.90 ml  21.86 ml/m LA Vol (A4C):   38.8 ml 23.62 ml/m LA Biplane Vol: 40.0 ml 24.35 ml/m  AORTIC VALVE                 PULMONIC VALVE AV Area (Vmax): 2.03 cm     PV Vmax:       0.58 m/s AV Vmax:        122.00 cm/s  PV Peak grad:  1.4 mmHg AV Peak Grad:   6.0 mmHg LVOT Vmax:      97.30 cm/s LVOT Vmean:     63.200 cm/s LVOT VTI:       0.225 m  AORTA Ao Root diam: 2.90 cm Ao Asc diam:  3.00 cm MITRAL VALVE               TRICUSPID VALVE MV Area (PHT): 2.67 cm    TV Peak grad:   18.8 mmHg MV Decel Time: 284 msec    TV Vmax:        2.17 m/s MV E velocity: 63.80 cm/s MV A velocity: 70.70 cm/s  SHUNTS MV E/A ratio:  0.90        Systemic VTI:  0.22 m                            Systemic Diam: 1.80 cm Neoma Laming Electronically signed by Neoma Laming Signature Date/Time: 10/07/2021/11:24:47 AM    Final        Assessment & Plan:   Problem List Items Addressed This Visit     Anemia    Follow cbc.       Relevant  Orders   CBC with Differential/Platelet (Completed)   Ferritin (Completed)   Autoimmune hepatitis ([place])    Followed by GI.  Follow liver function tests.       B12 deficiency   Relevant Orders   Vitamin B12 (Completed)   CKD (chronic kidney disease) stage 3, GFR 30-59 ml/min (HCC)    Avoid antiinflammatories.  Stay hydrated.  Follow metabolic panel. Recent GFR 54 (10/11/21).  Recheck today - especially given started hctz.        Hyperglycemia    Low carb diet and exercise.  Follow met b and a1c.       Relevant Orders   Hemoglobin A1c (Completed)   Hypertension - Primary    On benazepril 19m q day.  On sotalol 413mbid.  Also cardiology started her on hctz. Blood pressure doing well.  Continue current medications.  Follow metabolic panel.       Relevant Medications   sotalol (BETAPACE) 80 MG tablet   Other Relevant Orders   Basic metabolic panel (Completed)   Hypothyroidism    On thyroid replacement.  Follow tsh.       Relevant Medications   sotalol (BETAPACE)  80 MG tablet   SVT (supraventricular tachycardia) (HCC)    Recently diagnosed.  Continue sotalol.  No increased heart rate or palpitations reported currently.       Relevant Medications   sotalol (BETAPACE) 80 MG tablet   Other Visit Diagnoses     Screening cholesterol level       Relevant Orders   Lipid panel (Completed)   Hepatic function panel (Completed)        Einar Pheasant, MD

## 2022-01-25 ENCOUNTER — Telehealth: Payer: Self-pay

## 2022-01-25 ENCOUNTER — Other Ambulatory Visit: Payer: Self-pay

## 2022-01-25 DIAGNOSIS — I10 Essential (primary) hypertension: Secondary | ICD-10-CM

## 2022-01-25 NOTE — Telephone Encounter (Signed)
-----  Message from Einar Pheasant, MD sent at 01/25/2022  5:17 AM EST ----- Notify Ms Dullea that her kidney function decreased some when compared to the previous check.  Continue to stay hydrated.  We will follow.  Would like to recheck met b in 2-3 weeks.  Cholesterol ok.  Overall sugar control increased when compared to the last check.  Continue low carb diet.  We will follow.  B12 level, iron level, hgb and liver function tests are wnl.

## 2022-01-25 NOTE — Telephone Encounter (Signed)
LMTCB regarding lab results.  

## 2022-01-30 ENCOUNTER — Other Ambulatory Visit: Payer: Self-pay

## 2022-01-30 ENCOUNTER — Ambulatory Visit (INDEPENDENT_AMBULATORY_CARE_PROVIDER_SITE_OTHER): Payer: Medicare Other

## 2022-01-30 DIAGNOSIS — E538 Deficiency of other specified B group vitamins: Secondary | ICD-10-CM | POA: Diagnosis not present

## 2022-01-30 MED ORDER — CYANOCOBALAMIN 1000 MCG/ML IJ SOLN
1000.0000 ug | Freq: Once | INTRAMUSCULAR | Status: AC
  Administered 2022-01-30: 1000 ug via INTRAMUSCULAR

## 2022-01-30 NOTE — Progress Notes (Signed)
Pt presented today for b12 injection. Left deltoid, IM. Pt voiced no concerns nor showed any signs or distress during injection.

## 2022-02-04 ENCOUNTER — Encounter: Payer: Self-pay | Admitting: Internal Medicine

## 2022-02-04 NOTE — Assessment & Plan Note (Signed)
Follow cbc.  

## 2022-02-04 NOTE — Assessment & Plan Note (Signed)
Recently diagnosed.  Continue sotalol.  No increased heart rate or palpitations reported currently.

## 2022-02-04 NOTE — Assessment & Plan Note (Signed)
Followed by GI.  Follow liver function tests.  

## 2022-02-04 NOTE — Assessment & Plan Note (Signed)
On benazepril 40mg  q day.  On sotalol 40mg  bid.  Also cardiology started her on hctz. Blood pressure doing well.  Continue current medications.  Follow metabolic panel.

## 2022-02-04 NOTE — Assessment & Plan Note (Signed)
On thyroid replacement.  Follow tsh.  

## 2022-02-04 NOTE — Assessment & Plan Note (Signed)
Low carb diet and exercise.  Follow met b and a1c.

## 2022-02-04 NOTE — Assessment & Plan Note (Signed)
Avoid antiinflammatories.  Stay hydrated.  Follow metabolic panel. Recent GFR 54 (10/11/21).  Recheck today - especially given started hctz.

## 2022-02-13 ENCOUNTER — Other Ambulatory Visit: Payer: Medicare Other

## 2022-02-15 DIAGNOSIS — M1711 Unilateral primary osteoarthritis, right knee: Secondary | ICD-10-CM | POA: Diagnosis not present

## 2022-02-15 DIAGNOSIS — S82034A Nondisplaced transverse fracture of right patella, initial encounter for closed fracture: Secondary | ICD-10-CM | POA: Diagnosis not present

## 2022-02-19 ENCOUNTER — Other Ambulatory Visit: Payer: Self-pay

## 2022-02-19 ENCOUNTER — Other Ambulatory Visit (INDEPENDENT_AMBULATORY_CARE_PROVIDER_SITE_OTHER): Payer: Medicare Other

## 2022-02-19 DIAGNOSIS — I10 Essential (primary) hypertension: Secondary | ICD-10-CM | POA: Diagnosis not present

## 2022-02-19 LAB — BASIC METABOLIC PANEL
BUN: 19 mg/dL (ref 6–23)
CO2: 31 mEq/L (ref 19–32)
Calcium: 9.7 mg/dL (ref 8.4–10.5)
Chloride: 102 mEq/L (ref 96–112)
Creatinine, Ser: 0.93 mg/dL (ref 0.40–1.20)
GFR: 56.09 mL/min — ABNORMAL LOW (ref >60.00)
Glucose, Bld: 97 mg/dL (ref 70–99)
Potassium: 4.4 mEq/L (ref 3.5–5.1)
Sodium: 138 mEq/L (ref 135–145)

## 2022-02-22 ENCOUNTER — Other Ambulatory Visit: Payer: Self-pay | Admitting: Internal Medicine

## 2022-02-23 DIAGNOSIS — M1711 Unilateral primary osteoarthritis, right knee: Secondary | ICD-10-CM | POA: Diagnosis not present

## 2022-02-23 DIAGNOSIS — S82034D Nondisplaced transverse fracture of right patella, subsequent encounter for closed fracture with routine healing: Secondary | ICD-10-CM | POA: Diagnosis not present

## 2022-02-28 ENCOUNTER — Other Ambulatory Visit: Payer: Self-pay

## 2022-02-28 ENCOUNTER — Ambulatory Visit (INDEPENDENT_AMBULATORY_CARE_PROVIDER_SITE_OTHER): Payer: Medicare Other | Admitting: *Deleted

## 2022-02-28 DIAGNOSIS — E538 Deficiency of other specified B group vitamins: Secondary | ICD-10-CM | POA: Diagnosis not present

## 2022-02-28 MED ORDER — CYANOCOBALAMIN 1000 MCG/ML IJ SOLN
1000.0000 ug | Freq: Once | INTRAMUSCULAR | Status: AC
  Administered 2022-02-28: 1000 ug via INTRAMUSCULAR

## 2022-02-28 NOTE — Progress Notes (Signed)
Pt arrived for B12 injection, given in R deltoid. Pt tolerated injection well, showed no signs of distress nor voiced any concerns.  

## 2022-03-07 DIAGNOSIS — S82034D Nondisplaced transverse fracture of right patella, subsequent encounter for closed fracture with routine healing: Secondary | ICD-10-CM | POA: Diagnosis not present

## 2022-03-07 DIAGNOSIS — M1711 Unilateral primary osteoarthritis, right knee: Secondary | ICD-10-CM | POA: Diagnosis not present

## 2022-03-09 ENCOUNTER — Other Ambulatory Visit: Payer: Self-pay | Admitting: Gastroenterology

## 2022-03-09 DIAGNOSIS — K2270 Barrett's esophagus without dysplasia: Secondary | ICD-10-CM | POA: Diagnosis not present

## 2022-03-09 DIAGNOSIS — K52831 Collagenous colitis: Secondary | ICD-10-CM | POA: Diagnosis not present

## 2022-03-09 DIAGNOSIS — K754 Autoimmune hepatitis: Secondary | ICD-10-CM | POA: Diagnosis not present

## 2022-03-09 DIAGNOSIS — K2100 Gastro-esophageal reflux disease with esophagitis, without bleeding: Secondary | ICD-10-CM | POA: Diagnosis not present

## 2022-03-12 DIAGNOSIS — K52831 Collagenous colitis: Secondary | ICD-10-CM | POA: Diagnosis not present

## 2022-03-14 ENCOUNTER — Other Ambulatory Visit: Payer: Self-pay

## 2022-03-14 ENCOUNTER — Ambulatory Visit
Admission: RE | Admit: 2022-03-14 | Discharge: 2022-03-14 | Disposition: A | Discharge status: Home / Self Care | Payer: Medicare Other | Source: Ambulatory Visit | Attending: Gastroenterology | Admitting: Gastroenterology

## 2022-03-14 DIAGNOSIS — K754 Autoimmune hepatitis: Secondary | ICD-10-CM | POA: Insufficient documentation

## 2022-03-14 DIAGNOSIS — K7689 Other specified diseases of liver: Secondary | ICD-10-CM | POA: Diagnosis not present

## 2022-04-03 ENCOUNTER — Ambulatory Visit (INDEPENDENT_AMBULATORY_CARE_PROVIDER_SITE_OTHER): Payer: Medicare Other

## 2022-04-03 DIAGNOSIS — E538 Deficiency of other specified B group vitamins: Secondary | ICD-10-CM | POA: Diagnosis not present

## 2022-04-03 MED ORDER — CYANOCOBALAMIN 1000 MCG/ML IJ SOLN
1000.0000 ug | Freq: Once | INTRAMUSCULAR | Status: AC
  Administered 2022-04-03: 1000 ug via INTRAMUSCULAR

## 2022-04-03 NOTE — Progress Notes (Signed)
Patient presented for B 12 injection to left deltoid, patient voiced no concerns nor showed any signs of distress during injection. 

## 2022-04-04 DIAGNOSIS — Z20822 Contact with and (suspected) exposure to covid-19: Secondary | ICD-10-CM | POA: Diagnosis not present

## 2022-04-09 DIAGNOSIS — Z23 Encounter for immunization: Secondary | ICD-10-CM | POA: Diagnosis not present

## 2022-04-12 DIAGNOSIS — H35372 Puckering of macula, left eye: Secondary | ICD-10-CM | POA: Diagnosis not present

## 2022-04-24 DIAGNOSIS — I10 Essential (primary) hypertension: Secondary | ICD-10-CM | POA: Diagnosis not present

## 2022-04-24 DIAGNOSIS — R001 Bradycardia, unspecified: Secondary | ICD-10-CM | POA: Diagnosis not present

## 2022-04-24 DIAGNOSIS — I491 Atrial premature depolarization: Secondary | ICD-10-CM | POA: Diagnosis not present

## 2022-04-26 ENCOUNTER — Ambulatory Visit (INDEPENDENT_AMBULATORY_CARE_PROVIDER_SITE_OTHER): Payer: Medicare Other | Admitting: Internal Medicine

## 2022-04-26 ENCOUNTER — Encounter: Payer: Self-pay | Admitting: Internal Medicine

## 2022-04-26 VITALS — BP 120/74 | HR 59 | Temp 97.3°F | Resp 15 | Ht 61.0 in | Wt 142.0 lb

## 2022-04-26 DIAGNOSIS — D649 Anemia, unspecified: Secondary | ICD-10-CM | POA: Diagnosis not present

## 2022-04-26 DIAGNOSIS — I471 Supraventricular tachycardia, unspecified: Secondary | ICD-10-CM

## 2022-04-26 DIAGNOSIS — R7989 Other specified abnormal findings of blood chemistry: Secondary | ICD-10-CM | POA: Diagnosis not present

## 2022-04-26 DIAGNOSIS — E538 Deficiency of other specified B group vitamins: Secondary | ICD-10-CM

## 2022-04-26 DIAGNOSIS — N8110 Cystocele, unspecified: Secondary | ICD-10-CM

## 2022-04-26 DIAGNOSIS — K754 Autoimmune hepatitis: Secondary | ICD-10-CM

## 2022-04-26 DIAGNOSIS — N1831 Chronic kidney disease, stage 3a: Secondary | ICD-10-CM

## 2022-04-26 DIAGNOSIS — E039 Hypothyroidism, unspecified: Secondary | ICD-10-CM | POA: Diagnosis not present

## 2022-04-26 DIAGNOSIS — Z0000 Encounter for general adult medical examination without abnormal findings: Secondary | ICD-10-CM | POA: Diagnosis not present

## 2022-04-26 DIAGNOSIS — R739 Hyperglycemia, unspecified: Secondary | ICD-10-CM | POA: Diagnosis not present

## 2022-04-26 DIAGNOSIS — K2270 Barrett's esophagus without dysplasia: Secondary | ICD-10-CM | POA: Diagnosis not present

## 2022-04-26 DIAGNOSIS — Z1231 Encounter for screening mammogram for malignant neoplasm of breast: Secondary | ICD-10-CM

## 2022-04-26 DIAGNOSIS — I10 Essential (primary) hypertension: Secondary | ICD-10-CM | POA: Diagnosis not present

## 2022-04-26 DIAGNOSIS — K209 Barrett's esophagus without dysplasia: Secondary | ICD-10-CM

## 2022-04-26 LAB — BASIC METABOLIC PANEL
BUN: 27 mg/dL — ABNORMAL HIGH (ref 6–23)
CO2: 27 mEq/L (ref 19–32)
Calcium: 9.6 mg/dL (ref 8.4–10.5)
Chloride: 102 mEq/L (ref 96–112)
Creatinine, Ser: 1.05 mg/dL (ref 0.40–1.20)
GFR: 48.43 mL/min — ABNORMAL LOW (ref >60.00)
Glucose, Bld: 96 mg/dL (ref 70–99)
Potassium: 3.8 mEq/L (ref 3.5–5.1)
Sodium: 137 mEq/L (ref 135–145)

## 2022-04-26 LAB — LIPID PANEL
Cholesterol: 163 mg/dL (ref 0–200)
HDL: 50.6 mg/dL (ref >39.00)
LDL Cholesterol: 88 mg/dL (ref 0–99)
NonHDL: 112.44
Total CHOL/HDL Ratio: 3
Triglycerides: 124 mg/dL (ref 0.0–149.0)
VLDL: 24.8 mg/dL (ref 0.0–40.0)

## 2022-04-26 LAB — HEPATIC FUNCTION PANEL
ALT: 8 U/L (ref 0–35)
AST: 15 U/L (ref 0–37)
Albumin: 4.3 g/dL (ref 3.5–5.2)
Alkaline Phosphatase: 57 U/L (ref 39–117)
Bilirubin, Direct: 0.2 mg/dL (ref 0.0–0.3)
Total Bilirubin: 1 mg/dL (ref 0.2–1.2)
Total Protein: 7.4 g/dL (ref 6.0–8.3)

## 2022-04-26 LAB — HEMOGLOBIN A1C: Hgb A1c MFr Bld: 5.6 % (ref 4.6–6.5)

## 2022-04-26 MED ORDER — ZOLPIDEM TARTRATE ER 6.25 MG PO TBCR: 6.2500 mg | EXTENDED_RELEASE_TABLET | Freq: Every evening | ORAL | 1 refills | Status: DC | PRN

## 2022-04-26 NOTE — Progress Notes (Signed)
Patient ID: Lori [name], female   DOB: [birth year], 88 y.o.   MRN: [id number] ? ? ?Subjective:  ? ? Patient ID: Lori [name], female    DOB: 1934/03/01, 88 y.o.   MRN: [id number] ? ?This visit occurred during the SARS-CoV-2 public health emergency.  Safety protocols were in place, including screening questions prior to the visit, additional usage of staff PPE, and extensive cleaning of exam room while observing appropriate contact time as indicated for disinfecting solutions.  ? ? ?Chief Complaint  ?Patient presents with  ? Follow-up  ?  CPE  ? Hypertension  ? Hypothyroidism  ? .  ? ?HPI ?With history of hypertension, hypercholesterolemia and hypothyroidism.  She comes in today to follow up on these issues as well as for a complete physical exam.  Recent fall - 01/2022.  Fractured her knee.  Has been seeing ortho.  Has been released.  No chest pain or sob reported.  No abdominal pain.  Bowels moving.  Does report a bulge - vagina. Noticed when wipes.  No pain.  No irritation.   ? ? ?Past Medical History:  ?Diagnosis Date  ? Anemia   ? iron deficient  ? Arthritis   ? Autoimmune hepatitis ([place]) 05/10/2016  ? Hep C  ? B12 deficiency   ? Barrett esophagus 01/2016  ? Bradycardia   ? Chronic kidney disease   ? stage III  ? Diverticulosis   ? GERD (gastroesophageal reflux disease)   ? Hypertension   ? Hypothyroidism   ? Ulcer disease   ? ?Past Surgical History:  ?Procedure Laterality Date  ? CATARACT EXTRACTION W/PHACO Left 08/30/2021  ? Procedure: CATARACT EXTRACTION PHACO AND INTRAOCULAR LENS PLACEMENT ([place]) LEFT 8.21 01:00.3;  Surgeon: Leandrew Koyanagi, MD;  Location: [facility];  Service: Ophthalmology;  Laterality: Left;  ? CATARACT EXTRACTION W/PHACO Right 09/13/2021  ? Procedure: CATARACT EXTRACTION PHACO AND INTRAOCULAR LENS PLACEMENT ([place]) RIGHT;  Surgeon: Leandrew Koyanagi, MD;  Location: [facility];  Service: Ophthalmology;  Laterality: Right;  8.11 ?00:59.8  ? CHOLECYSTECTOMY  N/A 06/25/2018  ? Procedure: LAPAROSCOPIC CHOLECYSTECTOMY WITH INTRAOPERATIVE CHOLANGIOGRAM;  Surgeon: Robert Bellow, MD;  Location: [facility];  Service: General;  Laterality: N/A;  ? ESOPHAGOGASTRODUODENOSCOPY (EGD) WITH PROPOFOL N/A 01/30/2016  ? Procedure: ESOPHAGOGASTRODUODENOSCOPY (EGD) WITH PROPOFOL;  Surgeon: Lollie Sails, MD;  Location: [facility] ENDOSCOPY;  Service: Endoscopy;  Laterality: N/A;  ? ESOPHAGOGASTRODUODENOSCOPY (EGD) WITH PROPOFOL N/A 01/14/2018  ? Procedure: ESOPHAGOGASTRODUODENOSCOPY (EGD) WITH PROPOFOL;  Surgeon: Lollie Sails, MD;  Location: [facility] ENDOSCOPY;  Service: Endoscopy;  Laterality: N/A;  ? ESOPHAGOGASTRODUODENOSCOPY (EGD) WITH PROPOFOL N/A 05/20/2018  ? Procedure: ESOPHAGOGASTRODUODENOSCOPY (EGD) WITH PROPOFOL;  Surgeon: Lollie Sails, MD;  Location: [facility] ENDOSCOPY;  Service: Endoscopy;  Laterality: N/A;  ? ESOPHAGOGASTRODUODENOSCOPY (EGD) WITH PROPOFOL N/A 01/13/2019  ? Procedure: ESOPHAGOGASTRODUODENOSCOPY (EGD) WITH PROPOFOL;  Surgeon: Lollie Sails, MD;  Location: [facility] ENDOSCOPY;  Service: Endoscopy;  Laterality: N/A;  ? HERNIA REPAIR    ? lap hiatal hernia repair  07/08/08  ? LEFT OOPHORECTOMY  1993  ? benign ovarian cyst  ? TUBAL LIGATION  1981  ? ?Family History  ?Problem Relation Age of Onset  ? Stroke Mother   ? Breast cancer Neg Hx   ? Colon cancer Neg Hx   ? ?Social History  ? ?Socioeconomic History  ? Marital status: Widowed  ?  Spouse name: Not on file  ? Number of children: 0  ? Years of education: Not on file  ?  Highest education level: Not on file  ?Occupational History  ? Not on file  ?Tobacco Use  ? Smoking status: Never  ? Smokeless tobacco: Never  ?Vaping Use  ? Vaping Use: Never used  ?Substance and Sexual Activity  ? Alcohol use: No  ?  Alcohol/week: 0.0 standard drinks  ? Drug use: No  ? Sexual activity: Never  ?Other Topics Concern  ? Not on file  ?Social History Narrative  ? Not on file  ? ?Social Determinants of Health  ? ?Financial Resource  Strain: Low Risk   ? Difficulty of Paying Living Expenses: Not hard at all  ?Food Insecurity: No Food Insecurity  ? Worried About Charity fundraiser in the Last Year: Never true  ? Ran Out of Food in the Last Year: Never true  ?Transportation Needs: No Transportation Needs  ? Lack of Transportation (Medical): No  ? Lack of Transportation (Non-Medical): No  ?Physical Activity: Insufficiently Active  ? Days of Exercise per Week: 3 days  ? Minutes of Exercise per Session: 20 min  ?Stress: No Stress Concern Present  ? Feeling of Stress : Not at all  ?Social Connections: Unknown  ? Frequency of Communication with Friends and Family: More than three times a week  ? Frequency of Social Gatherings with Friends and Family: More than three times a week  ? Attends Religious Services: Not on file  ? Active Member of Clubs or Organizations: Not on file  ? Attends Archivist Meetings: Not on file  ? Marital Status: Not on file  ? ? ? ?Review of Systems  ?Constitutional:  Negative for appetite change and unexpected weight change.  ?HENT:  Negative for congestion, sinus pressure and sore throat.   ?Eyes:  Negative for pain and visual disturbance.  ?Respiratory:  Negative for cough, chest tightness and shortness of breath.   ?Cardiovascular:  Negative for chest pain, palpitations and leg swelling.  ?Gastrointestinal:  Negative for abdominal pain, nausea and vomiting.  ?     Bowels stable.   ?Genitourinary:  Negative for difficulty urinating and dysuria.  ?Musculoskeletal:  Negative for myalgias.  ?     Recent fall.  Fracture - knee.    ?Skin:  Negative for color change and rash.  ?Neurological:  Negative for dizziness, light-headedness and headaches.  ?Hematological:  Negative for adenopathy. Does not bruise/bleed easily.  ?Psychiatric/Behavioral:  Negative for agitation and dysphoric mood.   ? ?   ?Objective:  ?  ? ?BP 120/74 (BP Location: Left Arm, Patient Position: Sitting, Cuff Size: Small)   Pulse (!) 59   Temp (!)  97.3 ?F (36.3 ?C) (Temporal)   Resp 15   Ht '5\' 1"'$  (1.549 m)   Wt 142 lb (64.4 kg)   SpO2 98%   BMI 26.83 kg/m?  ?Wt Readings from Last 3 Encounters:  ?04/26/22 142 lb (64.4 kg)  ?01/24/22 143 lb (64.9 kg)  ?10/18/21 155 lb (70.3 kg)  ? ? ?Physical Exam ?Vitals reviewed.  ?Constitutional:   ?   General: She is not in acute distress. ?   Appearance: Normal appearance. She is well-developed.  ?HENT:  ?   Head: Normocephalic and atraumatic.  ?   Right Ear: External ear normal.  ?   Left Ear: External ear normal.  ?Eyes:  ?   General: No scleral icterus.    ?   Right eye: No discharge.     ?   Left eye: No discharge.  ?   Conjunctiva/sclera:  Conjunctivae normal.  ?Neck:  ?   Thyroid: No thyromegaly.  ?Cardiovascular:  ?   Rate and Rhythm: Normal rate and regular rhythm.  ?Pulmonary:  ?   Effort: No tachypnea, accessory muscle usage or respiratory distress.  ?   Breath sounds: Normal breath sounds. No decreased breath sounds or wheezing.  ?Chest:  ?Breasts: ?   Right: No inverted nipple, mass, nipple discharge or tenderness (no axillary adenopathy).  ?   Left: No inverted nipple, mass, nipple discharge or tenderness (no axilarry adenopathy).  ?Abdominal:  ?   General: Bowel sounds are normal.  ?   Palpations: Abdomen is soft.  ?   Tenderness: There is no abdominal tenderness.  ?Genitourinary: ?   Comments: Normal external genitalia.  Vaginal vault without lesions.  Vaginal/bladder prolapse.  Could not appreciate any adnexal masses or tenderness.   ?Musculoskeletal:     ?   General: No swelling or tenderness.  ?   Cervical back: Neck supple.  ?Lymphadenopathy:  ?   Cervical: No cervical adenopathy.  ?Skin: ?   Findings: No erythema or rash.  ?Neurological:  ?   Mental Status: She is alert and oriented to person, place, and time.  ?Psychiatric:     ?   Mood and Affect: Mood normal.     ?   Behavior: Behavior normal.  ? ? ? ?Outpatient Encounter Medications as of 04/26/2022  ?Medication Sig  ? aspirin 81 MG tablet Take  81 mg by mouth daily.  ? benazepril (LOTENSIN) 20 MG tablet Take 1 tablet by mouth 2 (two) times daily.  ? calcitonin, salmon, (MIACALCIN/FORTICAL) 200 UNIT/ACT nasal spray One spray alternating nostrils qod

## 2022-04-26 NOTE — Assessment & Plan Note (Addendum)
Physical today 04/26/22.  Mammogram 07/20/21 - birads I.  Followed by GI.  ?

## 2022-05-02 ENCOUNTER — Telehealth: Payer: Self-pay

## 2022-05-02 NOTE — Telephone Encounter (Signed)
Patient received message to call Dr. Alisa Graff nurse back.  Please call. ?

## 2022-05-02 NOTE — Telephone Encounter (Signed)
Lm for pt to cb re :  ? ? ? ? ?Notify Ms Zaun that her kidney function is slightly decreased from last check, but relatively stable from check prior.  Need to stay hydrated.  Continue to avoid antiinflammatories.  We will follow.  Recheck met b and urinalysis in 3-4 weeks.  (Dx ckd 3a).  Cholesterol levels are ok. Overall sugar control wnl.  Liver function tests wnl.  ?

## 2022-05-03 NOTE — Telephone Encounter (Signed)
S/w pt - advised of results. ?See lab note ?

## 2022-05-04 ENCOUNTER — Ambulatory Visit (INDEPENDENT_AMBULATORY_CARE_PROVIDER_SITE_OTHER): Payer: Medicare Other

## 2022-05-04 DIAGNOSIS — E538 Deficiency of other specified B group vitamins: Secondary | ICD-10-CM

## 2022-05-04 MED ORDER — CYANOCOBALAMIN 1000 MCG/ML IJ SOLN
1000.0000 ug | Freq: Once | INTRAMUSCULAR | Status: AC
  Administered 2022-05-04: 1000 ug via INTRAMUSCULAR

## 2022-05-04 NOTE — Progress Notes (Signed)
Patient presented for B 12 injection to right deltoid, patient voiced no concerns nor showed any signs of distress during injection. 

## 2022-05-06 ENCOUNTER — Encounter: Payer: Self-pay | Admitting: Internal Medicine

## 2022-05-06 DIAGNOSIS — N8110 Cystocele, unspecified: Secondary | ICD-10-CM | POA: Insufficient documentation

## 2022-05-06 NOTE — Assessment & Plan Note (Signed)
Followed by GI.  Follow liver function tests.  

## 2022-05-06 NOTE — Assessment & Plan Note (Signed)
Upper symptoms controlled.  Continues protonix.  EGD 12/2018.  Recommended f/u EGD in 2 years.  Saw GI.  See note.  Follow.  ?

## 2022-05-06 NOTE — Assessment & Plan Note (Signed)
Followed by GI.  Stable.  Follow liver function tests.  

## 2022-05-06 NOTE — Assessment & Plan Note (Signed)
Low carb diet and exercise.  Follow met b and a1c.  ?

## 2022-05-06 NOTE — Assessment & Plan Note (Signed)
Follow cbc.  

## 2022-05-06 NOTE — Assessment & Plan Note (Signed)
Continue sotalol.  No increased heart rate or palpitations reported currently.  

## 2022-05-06 NOTE — Assessment & Plan Note (Signed)
Continue B12 injections.   

## 2022-05-06 NOTE — Assessment & Plan Note (Addendum)
Avoid antiinflammatories.  Stay hydrated.  Follow metabolic panel. Recent GFR 56(01/2022).  Check metabolic panel.   ?

## 2022-05-06 NOTE — Assessment & Plan Note (Signed)
Describes noticing vaginal bulge.  Exam appears to be c/w bladder prolapse.  Discussed further evaluation/treatment. Discussed gyn evaluation, pessary, etc.  Wants to monitor.   ?

## 2022-05-06 NOTE — Assessment & Plan Note (Signed)
On thyroid replacement.  Follow tsh.  

## 2022-05-06 NOTE — Assessment & Plan Note (Signed)
On benazepril '40mg'$  q day.  On sotalol '40mg'$  bid.  Also cardiology started her on hctz. Blood pressure doing well.  Continue current medications.  Follow metabolic panel.  ?

## 2022-05-13 ENCOUNTER — Other Ambulatory Visit: Payer: Self-pay | Admitting: Internal Medicine

## 2022-06-01 ENCOUNTER — Telehealth: Payer: Self-pay | Admitting: Internal Medicine

## 2022-06-01 DIAGNOSIS — N1831 Chronic kidney disease, stage 3a: Secondary | ICD-10-CM

## 2022-06-01 NOTE — Telephone Encounter (Signed)
Patient has a lab appt 06/05/2022, there are no orders in.

## 2022-06-02 NOTE — Telephone Encounter (Signed)
Orders placed for f/u labs.  

## 2022-06-02 NOTE — Addendum Note (Signed)
Addended by: Alisa Graff on: 06/02/2022 02:24 PM   Modules accepted: Orders

## 2022-06-05 ENCOUNTER — Other Ambulatory Visit (INDEPENDENT_AMBULATORY_CARE_PROVIDER_SITE_OTHER): Payer: Medicare Other

## 2022-06-05 ENCOUNTER — Ambulatory Visit (INDEPENDENT_AMBULATORY_CARE_PROVIDER_SITE_OTHER): Payer: Medicare Other | Admitting: *Deleted

## 2022-06-05 DIAGNOSIS — N1831 Chronic kidney disease, stage 3a: Secondary | ICD-10-CM | POA: Diagnosis not present

## 2022-06-05 DIAGNOSIS — E538 Deficiency of other specified B group vitamins: Secondary | ICD-10-CM

## 2022-06-05 LAB — BASIC METABOLIC PANEL
BUN: 26 mg/dL — ABNORMAL HIGH (ref 6–23)
CO2: 27 mEq/L (ref 19–32)
Calcium: 10.1 mg/dL (ref 8.4–10.5)
Chloride: 102 mEq/L (ref 96–112)
Creatinine, Ser: 0.95 mg/dL (ref 0.40–1.20)
GFR: 54.57 mL/min — ABNORMAL LOW (ref >60.00)
Glucose, Bld: 96 mg/dL (ref 70–99)
Potassium: 4.7 mEq/L (ref 3.5–5.1)
Sodium: 137 mEq/L (ref 135–145)

## 2022-06-05 LAB — URINALYSIS, ROUTINE W REFLEX MICROSCOPIC
Bilirubin Urine: NEGATIVE
Ketones, ur: NEGATIVE
Leukocytes,Ua: NEGATIVE
Nitrite: NEGATIVE
Specific Gravity, Urine: <=1.005 — AB (ref 1.000–1.030)
Total Protein, Urine: NEGATIVE
Urine Glucose: NEGATIVE
Urobilinogen, UA: 0.2 (ref 0.0–1.0)
pH: 6 (ref 5.0–8.0)

## 2022-06-05 MED ORDER — CYANOCOBALAMIN 1000 MCG/ML IJ SOLN
1000.0000 ug | Freq: Once | INTRAMUSCULAR | Status: AC
  Administered 2022-06-05: 1000 ug via INTRAMUSCULAR

## 2022-06-05 NOTE — Progress Notes (Signed)
Pt came in for labs & mentioned that she was scheduled for a B12 injection tomorrow. Today made 31 days since last injection. So B12 was given today instead.  Pt received B12 injection in Left Deltoid. Tolerated it well. Next appt already scheduled for 07/10/22.

## 2022-06-05 NOTE — Progress Notes (Signed)
Reviewed.    Dr Jordany Russett 

## 2022-06-06 ENCOUNTER — Ambulatory Visit: Payer: Medicare Other

## 2022-06-18 ENCOUNTER — Telehealth: Payer: Self-pay | Admitting: Internal Medicine

## 2022-06-18 NOTE — Telephone Encounter (Signed)
Copied from [place] [id number]. Topic: Medicare AWV >> Jun 18, 2022 10:18 AM Devoria Glassing wrote: Reason for CRM: Left message for patient to schedule Annual Wellness Visit.  Please schedule with Nurse Health Advisor Denisa O'Brien-Blaney, LPN at [facility]  Please call [phone] ask for [facility]

## 2022-06-19 ENCOUNTER — Ambulatory Visit (INDEPENDENT_AMBULATORY_CARE_PROVIDER_SITE_OTHER): Payer: Medicare Other

## 2022-06-19 VITALS — BP 137/73 | HR 66 | Ht 61.0 in | Wt 142.0 lb

## 2022-06-19 DIAGNOSIS — Z0000 Encounter for general adult medical examination without abnormal findings: Secondary | ICD-10-CM | POA: Diagnosis not present

## 2022-06-19 NOTE — Patient Instructions (Addendum)
  Lori [name] , Thank you for taking time to come for your Medicare Wellness Visit. I appreciate your ongoing commitment to your health goals. Please review the following plan we discussed and let me know if I can assist you in the future.   These are the goals we discussed:  Goals      Maintain Healthy Lifestyle     Walk for exercise. Stay hydrated. Healthy foods.        This is a list of the screening recommended for you and due dates:  Health Maintenance  Topic Date Due   Tetanus Vaccine  08/31/2022*   Mammogram  07/19/2022   Flu Shot  07/31/2022   Pneumonia Vaccine  Completed   DEXA scan (bone density measurement)  Completed   COVID-19 Vaccine  Completed   Zoster (Shingles) Vaccine  Completed   HPV Vaccine  Aged Out  *Topic was postponed. The date shown is not the original due date.

## 2022-06-19 NOTE — Progress Notes (Signed)
Subjective:   Lori [name] is a 88 y.o. female who presents for Medicare Annual (Subsequent) preventive examination.  Review of Systems    No ROS.  Medicare Wellness Virtual Visit.  Visual/audio telehealth visit, UTA vital signs.   See social history for additional risk factors.   Cardiac Risk Factors include: advanced age (>26mn, >>110women);hypertension     Objective:    Today's Vitals   06/19/22 1107  BP: 137/73  Pulse: 66  Weight: 142 lb (64.4 kg)  Height: '5\' 1"'  (1.549 m)   Body mass index is 26.83 kg/m.     06/19/2022   11:14 AM 10/07/2021    6:20 PM 10/05/2021    7:47 PM 09/13/2021    7:14 AM 06/16/2021   10:11 AM 06/15/2020    9:43 AM 06/01/2019   10:42 AM  Advanced Directives  Does Patient Have a Medical Advance Directive? No No No Yes Yes No No  Type of ATheatre stage managerof [place] will [place]   Does patient want to make changes to medical advance directive?    No - Guardian declined No - Patient declined No - Patient declined   Copy of [place] Chart?    No - copy requested No - copy requested    Would patient like information on creating a medical advance directive? No - Patient declined No - Patient declined No - Patient declined    Yes (MAU/Ambulatory/Procedural Areas - Information given)    Current Medications (verified) Outpatient Encounter Medications as of 06/19/2022  Medication Sig   aspirin 81 MG tablet Take 81 mg by mouth daily.   benazepril (LOTENSIN) 20 MG tablet Take 1 tablet by mouth 2 (two) times daily.   calcitonin, salmon, (MIACALCIN/FORTICAL) 200 UNIT/ACT nasal spray ONE SPRAY ALTERNATING NOSTRILS EVERY OTHER DAY   Calcium-Phosphorus-Vitamin D (CITRACAL +D3 PO) Take 1 tablet by mouth daily.   chlorzoxazone (PARAFON) 500 MG tablet TAKE 1 TABLET BY MOUTH EVERY DAY AT BEDTIME AS NEEDED. DO NOT TAKE WITH AMBIEN.   Cyanocobalamin (B-12) 1000 MCG/ML KIT Inject 1,000 mcg  as directed every 30 (thirty) days.    GLUCOSAMINE-CHONDROITIN PO Take 1 tablet by mouth 2 (two) times daily.    levothyroxine (SYNTHROID) 88 MCG tablet TAKE 1 TABLET BY MOUTH EVERY DAY   Magnesium Oxide 400 (240 Mg) MG TABS Take 1 tablet (400 mg total) by mouth 2 (two) times daily.   Mesalamine (ASACOL) 400 MG CPDR DR capsule Take 400 mg by mouth 2 (two) times daily.    pantoprazole (PROTONIX) 40 MG tablet Take 40 mg by mouth daily.   sotalol (BETAPACE) 80 MG tablet Take 0.5 mg by mouth daily.   Wheat Dextrin (BENEFIBER DRINK MIX) PACK Take 1 each by mouth daily.    zolpidem (AMBIEN CR) 6.25 MG CR tablet Take 1 tablet (6.25 mg total) by mouth at bedtime as needed for sleep. o   No facility-administered encounter medications on file as of 06/19/2022.    Allergies (verified) Penicillins, Daypro [oxaprozin], and Lodine [etodolac]   History: Past Medical History:  Diagnosis Date   Anemia    iron deficient   Arthritis    Autoimmune hepatitis (HStreeter 05/10/2016   Hep C   B12 deficiency    Barrett esophagus 01/2016   Bradycardia    Chronic kidney disease    stage III   Diverticulosis    GERD (gastroesophageal reflux disease)    Hypertension  Hypothyroidism    Ulcer disease    Past Surgical History:  Procedure Laterality Date   CATARACT EXTRACTION W/PHACO Left 08/30/2021   Procedure: CATARACT EXTRACTION PHACO AND INTRAOCULAR LENS PLACEMENT ([place]) LEFT 8.21 01:00.3;  Surgeon: Leandrew Koyanagi, MD;  Location: [place];  Service: Ophthalmology;  Laterality: Left;   CATARACT EXTRACTION W/PHACO Right 09/13/2021   Procedure: CATARACT EXTRACTION PHACO AND INTRAOCULAR LENS PLACEMENT ([place]) RIGHT;  Surgeon: Leandrew Koyanagi, MD;  Location: [facility];  Service: Ophthalmology;  Laterality: Right;  8.11 00:59.8   CHOLECYSTECTOMY N/A 06/25/2018   Procedure: LAPAROSCOPIC CHOLECYSTECTOMY WITH INTRAOPERATIVE CHOLANGIOGRAM;  Surgeon: Robert Bellow, MD;  Location: [facility]  ORS;  Service: General;  Laterality: N/A;   ESOPHAGOGASTRODUODENOSCOPY (EGD) WITH PROPOFOL N/A 01/30/2016   Procedure: ESOPHAGOGASTRODUODENOSCOPY (EGD) WITH PROPOFOL;  Surgeon: Lollie Sails, MD;  Location: [facility] ENDOSCOPY;  Service: Endoscopy;  Laterality: N/A;   ESOPHAGOGASTRODUODENOSCOPY (EGD) WITH PROPOFOL N/A 01/14/2018   Procedure: ESOPHAGOGASTRODUODENOSCOPY (EGD) WITH PROPOFOL;  Surgeon: Lollie Sails, MD;  Location: [facility] ENDOSCOPY;  Service: Endoscopy;  Laterality: N/A;   ESOPHAGOGASTRODUODENOSCOPY (EGD) WITH PROPOFOL N/A 05/20/2018   Procedure: ESOPHAGOGASTRODUODENOSCOPY (EGD) WITH PROPOFOL;  Surgeon: Lollie Sails, MD;  Location[facility] ENDOSCOPY;  Service: Endoscopy;  Laterality: N/A;   ESOPHAGOGASTRODUODENOSCOPY (EGD) WITH PROPOFOL N/A 01/13/2019   Procedure: ESOPHAGOGASTRODUODENOSCOPY (EGD) WITH PROPOFOL;  Surgeon: Lollie Sails, MD;  Location: [facility] ENDOSCOPY;  Service: Endoscopy;  Laterality: N/A;   HERNIA REPAIR     lap hiatal hernia repair  07/08/08   LEFT OOPHORECTOMY  1993   benign ovarian cyst   TUBAL LIGATION  1981   Family History  Problem Relation Age of Onset   Stroke Mother    Breast cancer Neg Hx    Colon cancer Neg Hx    Social History   Socioeconomic History   Marital status: Widowed    Spouse name: Not on file   Number of children: 0   Years of education: Not on file   Highest education level: Not on file  Occupational History   Not on file  Tobacco Use   Smoking status: Never   Smokeless tobacco: Never  Vaping Use   Vaping Use: Never used  Substance and Sexual Activity   Alcohol use: No    Alcohol/week: 0.0 standard drinks of alcohol   Drug use: No   Sexual activity: Never  Other Topics Concern   Not on file  Social History Narrative   Not on file   Social Determinants of Health   Financial Resource Strain: Low Risk  (06/19/2022)   Overall Financial Resource Strain (CARDIA)    Difficulty of Paying Living Expenses: Not hard at all   Food Insecurity: No Food Insecurity (06/19/2022)   Hunger Vital Sign    Worried About Running Out of Food in the Last Year: Never true    [place] in the Last Year: Never true  Transportation Needs: No Transportation Needs (06/19/2022)   PRAPARE - Hydrologist (Medical): No    Lack of Transportation (Non-Medical): No  Physical Activity: Insufficiently Active (06/16/2021)   Exercise Vital Sign    Days of Exercise per Week: 3 days    Minutes of Exercise per Session: 20 min  Stress: No Stress Concern Present (06/16/2021)   [place]    Feeling of Stress : Not at all  Social Connections: Unknown (06/19/2022)   Social Connection and Isolation Panel [NHANES]  Frequency of Communication with Friends and Family: More than three times a week    Frequency of Social Gatherings with Friends and Family: More than three times a week    Attends Religious Services: Not on Advertising copywriter or Organizations: Not on file    Attends Archivist Meetings: Not on file    Marital Status: Not on file    Tobacco Counseling Counseling given: Not Answered   Clinical Intake:  Pre-visit preparation completed: Yes        Diabetes: No  How often do you need to have someone help you when you read instructions, pamphlets, or other written materials from your doctor or pharmacy?: 1 - Never  Interpreter Needed?: No      Activities of Daily Living    06/19/2022   11:16 AM 10/07/2021    6:20 PM  In your present state of health, do you have any difficulty performing the following activities:  Hearing? 1 1  Comment Hearing aids   Vision? 0 0  Difficulty concentrating or making decisions? 0 0  Walking or climbing stairs? 1 0  Comment Cane walker currently in use   Dressing or bathing? 0 0  Doing errands, shopping? 0 0  Preparing Food and eating ? N   Using the Toilet? N   In  the past six months, have you accidently leaked urine? N   Do you have problems with loss of bowel control? N   Managing your Medications? N   Managing your Finances? N   Housekeeping or managing your Housekeeping? N     Patient Care Team: Einar Pheasant, MD as PCP - General (Internal Medicine)  Indicate any recent Medical Services you may have received from other than [facility] providers in the past year (date may be approximate).     Assessment:   This is a routine wellness examination for [place].  Virtual Visit via Telephone Note  I connected with  Lori [name] on 06/19/22 at 11:00 AM EDT by telephone and verified that I am speaking with the correct person using two identifiers.  Persons participating in the virtual visit: patient/Nurse Health Advisor   I discussed the limitations of performing an evaluation and management service by telehealth. We continued and completed visit with audio only. Some vital signs may be absent or patient reported.   Hearing/Vision screen Hearing Screening - Comments:: Followed by [facility] Hearing aid, bilateral  Vision Screening - Comments:: Followed by [facility]  Wears corrective lenses They have regular follow up with the ophthalmologist  Dietary issues and exercise activities discussed: Current Exercise Habits: Home exercise routine, Type of exercise: walking, Intensity: Mild Low cholesterol diet Avoids raw/fresh fruits and uncooked vegetables Fair water intake Pedialyte daily    Goals Addressed             This Visit's Progress    Maintain Healthy Lifestyle       Walk for exercise. Stay hydrated. Healthy foods.       Depression Screen    06/19/2022   11:11 AM 04/26/2022    9:13 AM 06/16/2021   10:10 AM 04/06/2021    2:40 PM 06/15/2020    9:45 AM 06/07/2020    9:50 AM 05/22/2019   10:21 AM  PHQ 2/9 Scores  PHQ - 2 Score 0 0 0 0 0 0 0  PHQ- 9 Score       0    Fall Risk  06/19/2022   11:15 AM  04/26/2022    9:13 AM 06/16/2021   10:13 AM 04/06/2021    2:40 PM 06/15/2020    9:44 AM  Fall Risk   Falls in the past year?  1 0 0 0  Number falls in past yr:  0 0  0  Injury with Fall?  1 0    Risk for fall due to : Impaired balance/gait History of fall(s);Impaired balance/gait;Impaired mobility     Risk for fall due to: Comment Cane walker in use      Follow up Falls evaluation completed Falls evaluation completed Falls evaluation completed  Falls evaluation completed    [place]: Home free of loose throw rugs in walkways, pet beds, electrical cords, etc? Yes  Adequate lighting in your home to reduce risk of falls? Yes   ASSISTIVE DEVICES UTILIZED TO PREVENT FALLS: Life alert? No  Use of a cane, walker or w/c? Yes , cane/walker Grab bars in the bathroom? Yes  Shower chair or bench in shower? Yes  Elevated toilet seat or a handicapped toilet? Yes   TIMED UP AND GO: Was the test performed? No .   Cognitive Function: Patient is alert and oriented x3.     03/26/2017   10:49 AM 03/26/2016    1:25 PM  MMSE - Mini Mental State Exam  Orientation to time 5 5  Orientation to Place 5 5  Registration 3 3  Attention/ Calculation 5 5  Recall 3 3  Language- name 2 objects 2 2  Language- repeat 1 1  Language- follow 3 step command 3 3  Language- read & follow direction 1 1  Write a sentence 1 1  Copy design 1 1  Total score 30 30        06/16/2021   10:30 AM 06/15/2020    9:50 AM 06/01/2019   10:45 AM 03/27/2018   10:59 AM  6CIT Screen  What Year? 0 points 0 points 0 points 0 points  What month? 0 points 0 points 0 points 0 points  What time? 0 points 0 points 0 points 0 points  Count back from 20  0 points 0 points 0 points  Months in reverse 0 points 0 points 0 points 0 points  Repeat phrase  2 points 0 points 0 points  Total Score  2 points 0 points 0 points    Immunizations Immunization History  Administered Date(s) Administered    Fluad Quad(high Dose 65+) 09/08/2019, 09/20/2020, 09/22/2021   Influenza, High Dose Seasonal PF 10/01/2017, 09/12/2018   Influenza,inj,Quad PF,6+ Mos 08/31/2014, 09/07/2015, 08/21/2016   Influenza-Unspecified 09/11/2012, 10/14/2013, 08/31/2014, 09/07/2015, 08/21/2016   PFIZER(Purple Top)SARS-COV-2 Vaccination 01/25/2020, 02/15/2020, 12/27/2020   Pfizer Covid-19 Vaccine Bivalent Booster 5y-11y 04/09/2022   Pneumococcal Conjugate-13 03/08/2014, 03/02/2015   Pneumococcal Polysaccharide-23 07/22/2008   Zoster Recombinat (Shingrix) 05/14/2018, 07/15/2018   Zoster, Live 12/31/2008   TDAP status: Due, Education has been provided regarding the importance of this vaccine. Advised may receive this vaccine at local pharmacy or Health Dept. Aware to provide a copy of the vaccination record if obtained from local pharmacy or Health Dept. Verbalized acceptance and understanding.  Screening Tests Health Maintenance  Topic Date Due   TETANUS/TDAP  08/31/2022 (Originally 10/01/1953)   MAMMOGRAM  07/19/2022   INFLUENZA VACCINE  07/31/2022   Pneumonia Vaccine 61+ Years old  Completed   DEXA SCAN  Completed   COVID-19 Vaccine  Completed   Zoster Vaccines- Shingrix  Completed   HPV VACCINES  Aged Out   Health Maintenance There are no preventive care reminders to display for this patient.  Mammogram- number provided for scheduling at [facility] [phone]. Ordered on 04/26/22. Due 07/19/22.   Lung Cancer Screening: (Low Dose CT Chest recommended if Age 61-80 years, 30 pack-year currently smoking OR have quit w/in 15years.) does not qualify.   Vision Screening: Recommended annual ophthalmology exams for early detection of glaucoma and other disorders of the eye.  Dental Screening: Recommended annual dental exams for proper oral hygiene.  Community Resource Referral / Chronic Care Management: CRR required this visit?  No   CCM required this visit?  No      Plan:   Keep all routine  maintenance appointments.   I have personally reviewed and noted the following in the patient's chart:   Medical and social history Use of alcohol, tobacco or illicit drugs  Current medications and supplements including opioid prescriptions.  Functional ability and status Nutritional status Physical activity Advanced directives List of other physicians Hospitalizations, surgeries, and ER visits in previous 12 months Vitals Screenings to include cognitive, depression, and falls Referrals and appointments  In addition, I have reviewed and discussed with patient certain preventive protocols, quality metrics, and best practice recommendations. A written personalized care plan for preventive services as well as general preventive health recommendations were provided to patient.     Varney Biles, LPN   01/10/5519

## 2022-07-01 ENCOUNTER — Other Ambulatory Visit: Payer: Self-pay | Admitting: Internal Medicine

## 2022-07-01 DIAGNOSIS — R252 Cramp and spasm: Secondary | ICD-10-CM

## 2022-07-10 ENCOUNTER — Ambulatory Visit (INDEPENDENT_AMBULATORY_CARE_PROVIDER_SITE_OTHER): Payer: Medicare Other | Admitting: *Deleted

## 2022-07-10 DIAGNOSIS — E538 Deficiency of other specified B group vitamins: Secondary | ICD-10-CM

## 2022-07-10 MED ORDER — CYANOCOBALAMIN 1000 MCG/ML IJ SOLN
1000.0000 ug | Freq: Once | INTRAMUSCULAR | Status: AC
  Administered 2022-07-10: 1000 ug via INTRAMUSCULAR

## 2022-07-10 NOTE — Progress Notes (Signed)
Pt received B12 injection in right deltoid. Tolerated it well with no complaints or concerns.

## 2022-07-30 ENCOUNTER — Ambulatory Visit
Admission: RE | Admit: 2022-07-30 | Discharge: 2022-07-30 | Disposition: A | Discharge status: Home / Self Care | Payer: Medicare Other | Source: Ambulatory Visit | Attending: Internal Medicine | Admitting: Internal Medicine

## 2022-07-30 DIAGNOSIS — Z1231 Encounter for screening mammogram for malignant neoplasm of breast: Secondary | ICD-10-CM | POA: Diagnosis not present

## 2022-08-01 ENCOUNTER — Other Ambulatory Visit: Payer: Self-pay | Admitting: Internal Medicine

## 2022-08-10 ENCOUNTER — Ambulatory Visit: Payer: Medicare Other

## 2022-08-13 ENCOUNTER — Ambulatory Visit (INDEPENDENT_AMBULATORY_CARE_PROVIDER_SITE_OTHER): Payer: Medicare Other

## 2022-08-13 DIAGNOSIS — E538 Deficiency of other specified B group vitamins: Secondary | ICD-10-CM

## 2022-08-13 MED ORDER — CYANOCOBALAMIN 1000 MCG/ML IJ SOLN
1000.0000 ug | Freq: Once | INTRAMUSCULAR | Status: AC
  Administered 2022-08-13: 1000 ug via INTRAMUSCULAR

## 2022-08-13 NOTE — Progress Notes (Signed)
Patient presented for B 12 injection to right deltoid, patient voiced no concerns nor showed any signs of distress during injection. 

## 2022-08-15 ENCOUNTER — Other Ambulatory Visit: Payer: Self-pay | Admitting: Internal Medicine

## 2022-08-20 ENCOUNTER — Other Ambulatory Visit: Payer: Self-pay | Admitting: Internal Medicine

## 2022-08-28 ENCOUNTER — Ambulatory Visit (INDEPENDENT_AMBULATORY_CARE_PROVIDER_SITE_OTHER): Payer: Medicare Other | Admitting: Internal Medicine

## 2022-08-28 ENCOUNTER — Encounter: Payer: Self-pay | Admitting: Internal Medicine

## 2022-08-28 VITALS — BP 124/78 | HR 56 | Temp 97.8°F | Ht 61.0 in | Wt 142.0 lb

## 2022-08-28 DIAGNOSIS — I471 Supraventricular tachycardia, unspecified: Secondary | ICD-10-CM

## 2022-08-28 DIAGNOSIS — I10 Essential (primary) hypertension: Secondary | ICD-10-CM

## 2022-08-28 DIAGNOSIS — M25561 Pain in right knee: Secondary | ICD-10-CM

## 2022-08-28 DIAGNOSIS — K2270 Barrett's esophagus without dysplasia: Secondary | ICD-10-CM | POA: Diagnosis not present

## 2022-08-28 DIAGNOSIS — D649 Anemia, unspecified: Secondary | ICD-10-CM

## 2022-08-28 DIAGNOSIS — R739 Hyperglycemia, unspecified: Secondary | ICD-10-CM | POA: Diagnosis not present

## 2022-08-28 DIAGNOSIS — E538 Deficiency of other specified B group vitamins: Secondary | ICD-10-CM

## 2022-08-28 DIAGNOSIS — N1831 Chronic kidney disease, stage 3a: Secondary | ICD-10-CM

## 2022-08-28 DIAGNOSIS — I491 Atrial premature depolarization: Secondary | ICD-10-CM | POA: Diagnosis not present

## 2022-08-28 DIAGNOSIS — K754 Autoimmune hepatitis: Secondary | ICD-10-CM | POA: Diagnosis not present

## 2022-08-28 DIAGNOSIS — R252 Cramp and spasm: Secondary | ICD-10-CM | POA: Diagnosis not present

## 2022-08-28 DIAGNOSIS — K209 Esophagitis, unspecified without bleeding: Secondary | ICD-10-CM | POA: Diagnosis not present

## 2022-08-28 DIAGNOSIS — E039 Hypothyroidism, unspecified: Secondary | ICD-10-CM

## 2022-08-28 DIAGNOSIS — R001 Bradycardia, unspecified: Secondary | ICD-10-CM | POA: Diagnosis not present

## 2022-08-28 NOTE — Progress Notes (Signed)
Patient ID: Lori [name], female   DOB: Nov 15, 1934, 88 y.o.   MRN: [id number]   Subjective:    Patient ID: Lori [name], female    DOB: 1934-06-18, 88 y.o.   MRN: [id number]   Patient here for  Chief Complaint  Patient presents with   Follow-up   .   HPI Here to follow up regarding her blood pressure and bradycardia/SVT.  Marland Kitchen The patient was previously admitted to [facility] for "spells" with associated palpitations and syncope, noted to be in paroxysmal SVT, discharged on sotalol without recurrence. Holter monitor revealed predominant sinus bradycardia with a mean heart rate of 56 bpm. Sotalol was reduced to 40 mg once daily due to bradycardia,Saw cardiology 08/28/22.  Felt stable.  States she is doing relatively well.  She is having issues with her right knee.  Previous xray 09-2021 - degenerative changes and joint effusion.  Saw ortho - treated for right patella transverse nondisplaced fracture - s/p fall 02/02/22. Continues to have pain and discussed referral to PT.  Blood pressure doing well  119-120/60.  Some occasional leg cramps - increased water intake helps.  Some diarrhea.  Overall bowels are stable.     Past Medical History:  Diagnosis Date   Anemia    iron deficient   Arthritis    Autoimmune hepatitis ([place]) 05/10/2016   Hep C   B12 deficiency    Barrett esophagus 01/2016   Bradycardia    Chronic kidney disease    stage III   Diverticulosis    GERD (gastroesophageal reflux disease)    Hypertension    Hypothyroidism    Ulcer disease    Past Surgical History:  Procedure Laterality Date   CATARACT EXTRACTION W/PHACO Left 08/30/2021   Procedure: CATARACT EXTRACTION PHACO AND INTRAOCULAR LENS PLACEMENT ([place]) LEFT 8.21 01:00.3;  Surgeon: Leandrew Koyanagi, MD;  Location: [place];  Service: Ophthalmology;  Laterality: Left;   CATARACT EXTRACTION W/PHACO Right 09/13/2021   Procedure: CATARACT EXTRACTION PHACO AND INTRAOCULAR LENS PLACEMENT ([place]) RIGHT;   Surgeon: Leandrew Koyanagi, MD;  Location: [place];  Service: Ophthalmology;  Laterality: Right;  8.11 00:59.8   CHOLECYSTECTOMY N/A 06/25/2018   Procedure: LAPAROSCOPIC CHOLECYSTECTOMY WITH INTRAOPERATIVE CHOLANGIOGRAM;  Surgeon: Robert Bellow, MD;  Location: [facility] ORS;  Service: General;  Laterality: N/A;   ESOPHAGOGASTRODUODENOSCOPY (EGD) WITH PROPOFOL N/A 01/30/2016   Procedure: ESOPHAGOGASTRODUODENOSCOPY (EGD) WITH PROPOFOL;  Surgeon: Lollie Sails, MD;  Location: [facility] ENDOSCOPY;  Service: Endoscopy;  Laterality: N/A;   ESOPHAGOGASTRODUODENOSCOPY (EGD) WITH PROPOFOL N/A 01/14/2018   Procedure: ESOPHAGOGASTRODUODENOSCOPY (EGD) WITH PROPOFOL;  Surgeon: Lollie Sails, MD;  Location: [facility] ENDOSCOPY;  Service: Endoscopy;  Laterality: N/A;   ESOPHAGOGASTRODUODENOSCOPY (EGD) WITH PROPOFOL N/A 05/20/2018   Procedure: ESOPHAGOGASTRODUODENOSCOPY (EGD) WITH PROPOFOL;  Surgeon: Lollie Sails, MD;  Location: [facility] ENDOSCOPY;  Service: Endoscopy;  Laterality: N/A;   ESOPHAGOGASTRODUODENOSCOPY (EGD) WITH PROPOFOL N/A 01/13/2019   Procedure: ESOPHAGOGASTRODUODENOSCOPY (EGD) WITH PROPOFOL;  Surgeon: Lollie Sails, MD;  Location: [facility] ENDOSCOPY;  Service: Endoscopy;  Laterality: N/A;   HERNIA REPAIR     lap hiatal hernia repair  07/08/08   LEFT OOPHORECTOMY  1993   benign ovarian cyst   TUBAL LIGATION  1981   Family History  Problem Relation Age of Onset   Stroke Mother    Breast cancer Neg Hx    Colon cancer Neg Hx    Social History   Socioeconomic History   Marital status: Widowed    Spouse name: Not on  file   Number of children: 0   Years of education: Not on file   Highest education level: Not on file  Occupational History   Not on file  Tobacco Use   Smoking status: Never   Smokeless tobacco: Never  Vaping Use   Vaping Use: Never used  Substance and Sexual Activity   Alcohol use: No    Alcohol/week: 0.0 standard drinks of alcohol   Drug use: No    Sexual activity: Never  Other Topics Concern   Not on file  Social History Narrative   Not on file   Social Determinants of Health   Financial Resource Strain: Low Risk  (06/19/2022)   Overall Financial Resource Strain (CARDIA)    Difficulty of Paying Living Expenses: Not hard at all  Food Insecurity: No Food Insecurity (06/19/2022)   Hunger Vital Sign    Worried About Running Out of Food in the Last Year: Never true    Ran Out of Food in the Last Year: Never true  Transportation Needs: No Transportation Needs (06/19/2022)   PRAPARE - Hydrologist (Medical): No    Lack of Transportation (Non-Medical): No  Physical Activity: Insufficiently Active (06/16/2021)   Exercise Vital Sign    Days of Exercise per Week: 3 days    Minutes of Exercise per Session: 20 min  Stress: No Stress Concern Present (06/16/2021)   [place]    Feeling of Stress : Not at all  Social Connections: Unknown (06/19/2022)   Social Connection and Isolation Panel [NHANES]    Frequency of Communication with Friends and Family: More than three times a week    Frequency of Social Gatherings with Friends and Family: More than three times a week    Attends Religious Services: Not on Advertising copywriter or Organizations: Not on file    Attends Archivist Meetings: Not on file    Marital Status: Not on file     Review of Systems  Constitutional:  Negative for appetite change and unexpected weight change.  HENT:  Negative for congestion and sinus pressure.   Respiratory:  Negative for cough, chest tightness and shortness of breath.   Cardiovascular:  Negative for chest pain, palpitations and leg swelling.  Gastrointestinal:  Negative for abdominal pain, diarrhea, nausea and vomiting.  Genitourinary:  Negative for difficulty urinating and dysuria.  Musculoskeletal:  Negative for myalgias.       Right knee  pain as outlined.   Skin:  Negative for color change and rash.  Neurological:  Negative for dizziness, light-headedness and headaches.  Psychiatric/Behavioral:  Negative for agitation and dysphoric mood.        Objective:     BP 124/78 (BP Location: Left Arm, Patient Position: Sitting, Cuff Size: Normal)   Pulse (!) 56   Temp 97.8 F (36.6 C) (Oral)   Ht '5\' 1"'  (1.549 m)   Wt 142 lb (64.4 kg)   SpO2 96%   BMI 26.83 kg/m  Wt Readings from Last 3 Encounters:  08/28/22 142 lb (64.4 kg)  06/19/22 142 lb (64.4 kg)  04/26/22 142 lb (64.4 kg)    Physical Exam Vitals reviewed.  Constitutional:      General: She is not in acute distress.    Appearance: Normal appearance.  HENT:     Head: Normocephalic and atraumatic.     Right Ear: External ear normal.  Left Ear: External ear normal.  Eyes:     General: No scleral icterus.       Right eye: No discharge.        Left eye: No discharge.     Conjunctiva/sclera: Conjunctivae normal.  Neck:     Thyroid: No thyromegaly.  Cardiovascular:     Rate and Rhythm: Normal rate and regular rhythm.  Pulmonary:     Effort: No respiratory distress.     Breath sounds: Normal breath sounds. No wheezing.  Abdominal:     General: Bowel sounds are normal.     Palpations: Abdomen is soft.     Tenderness: There is no abdominal tenderness.  Musculoskeletal:        General: No swelling or tenderness.     Cervical back: Neck supple. No tenderness.  Lymphadenopathy:     Cervical: No cervical adenopathy.  Skin:    Findings: No erythema or rash.  Neurological:     Mental Status: She is alert.  Psychiatric:        Mood and Affect: Mood normal.        Behavior: Behavior normal.      Outpatient Encounter Medications as of 08/28/2022  Medication Sig   aspirin 81 MG tablet Take 81 mg by mouth daily.   benazepril (LOTENSIN) 20 MG tablet Take 1 tablet by mouth 2 (two) times daily.   calcitonin, salmon, (MIACALCIN/FORTICAL) 200 UNIT/ACT nasal  spray ONE SPRAY ALTERNATING NOSTRILS EVERY OTHER DAY   Calcium-Phosphorus-Vitamin D (CITRACAL +D3 PO) Take 1 tablet by mouth daily.   chlorzoxazone (PARAFON) 500 MG tablet TAKE 1 TABLET BY MOUTH EVERY DAY AT BEDTIME AS NEEDED. DO NOT TAKE WITH AMBIEN.   Cyanocobalamin (B-12) 1000 MCG/ML KIT Inject 1,000 mcg as directed every 30 (thirty) days.    GLUCOSAMINE-CHONDROITIN PO Take 1 tablet by mouth 2 (two) times daily.    levothyroxine (SYNTHROID) 88 MCG tablet TAKE 1 TABLET BY MOUTH EVERY DAY   Magnesium Oxide 400 (240 Mg) MG TABS Take 1 tablet (400 mg total) by mouth 2 (two) times daily.   Mesalamine (ASACOL) 400 MG CPDR DR capsule Take 400 mg by mouth 2 (two) times daily.    pantoprazole (PROTONIX) 40 MG tablet Take 40 mg by mouth daily.   sotalol (BETAPACE) 80 MG tablet Take 0.5 mg by mouth daily.   Wheat Dextrin (BENEFIBER DRINK MIX) PACK Take 1 each by mouth daily.    zolpidem (AMBIEN CR) 6.25 MG CR tablet TAKE 1 TABLET (6.25 MG TOTAL) BY MOUTH AT BEDTIME AS NEEDED FOR SLEEP. O   No facility-administered encounter medications on file as of 08/28/2022.     Lab Results  Component Value Date   WBC 4.9 01/24/2022   HGB 12.7 01/24/2022   HCT 38.6 01/24/2022   PLT 236.0 01/24/2022   GLUCOSE 92 08/28/2022   CHOL 176 08/28/2022   TRIG 118.0 08/28/2022   HDL 47.60 08/28/2022   LDLCALC 105 (H) 08/28/2022   ALT 9 08/28/2022   AST 17 08/28/2022   NA 139 08/28/2022   K 4.1 08/28/2022   CL 104 08/28/2022   CREATININE 1.08 08/28/2022   BUN 35 (H) 08/28/2022   CO2 25 08/28/2022   TSH 1.05 08/28/2022   INR 1.0 06/07/2020   HGBA1C 6.0 08/28/2022    MM 3D SCREEN BREAST BILATERAL  Result Date: 07/31/2022 CLINICAL DATA:  Screening. EXAM: DIGITAL SCREENING BILATERAL MAMMOGRAM WITH TOMOSYNTHESIS AND CAD TECHNIQUE: Bilateral screening digital craniocaudal and mediolateral oblique mammograms were obtained. Bilateral  screening digital breast tomosynthesis was performed. The images were evaluated with  computer-aided detection. COMPARISON:  Previous exam(s). ACR Breast Density Category b: There are scattered areas of fibroglandular density. FINDINGS: There are no findings suspicious for malignancy. IMPRESSION: No mammographic evidence of malignancy. A result letter of this screening mammogram will be mailed directly to the patient. RECOMMENDATION: Screening mammogram in one year. (Code:SM-B-01Y) BI-RADS CATEGORY  1: Negative. Electronically Signed   By: Nolon Nations M.D.   On: 07/31/2022 14:07       Assessment & Plan:   Problem List Items Addressed This Visit     Anemia    Follow cbc.       Autoimmune hepatitis ([place])    Followed by GI.  Follow liver function tests.       B12 deficiency    Continue b12 injections.       Barrett's esophagus with esophagitis    Upper symptoms controlled.  Continues protonix.  EGD 12/2018.  Recommended f/u EGD in 2 years.  Saw GI.  Last seen 02/2022 - recommended hold f/u EGD.        CKD (chronic kidney disease) stage 3, GFR 30-59 ml/min (HCC)    Avoid antiinflammatories.  Stay hydrated.  Follow metabolic panel.  Check metabolic panel.        Hyperglycemia    Low carb diet and exercise.  Follow met b and a1c.       Relevant Orders   Hemoglobin A1c (Completed)   Hypertension - Primary    On benazepril 83m bid.  On sotalol 416mq day.   Also cardiology started her on hctz. Blood pressure doing well.  Continue current medications.  Follow metabolic panel.       Relevant Orders   Lipid panel (Completed)   Hepatic function panel (Completed)   Basic metabolic panel (Completed)   Hypothyroidism    On thyroid replacement.  Follow tsh.       Relevant Orders   TSH (Completed)   Leg cramps    Continue to stay hydrated.  Stretches.  Follow.        PSVT (paroxysmal supraventricular tachycardia) (HCC)    Continues on sotalol.  Stable.        Right knee pain    S/p patella fracture as outlined.  Has seen ortho previously. Persistent pain.  Refer to PT.       Relevant Orders   Ambulatory referral to Physical Therapy     ChEinar PheasantMD

## 2022-08-29 LAB — BASIC METABOLIC PANEL
BUN: 35 mg/dL — ABNORMAL HIGH (ref 6–23)
CO2: 25 mEq/L (ref 19–32)
Calcium: 10 mg/dL (ref 8.4–10.5)
Chloride: 104 mEq/L (ref 96–112)
Creatinine, Ser: 1.08 mg/dL (ref 0.40–1.20)
GFR: 46.71 mL/min — ABNORMAL LOW (ref >60.00)
Glucose, Bld: 92 mg/dL (ref 70–99)
Potassium: 4.1 mEq/L (ref 3.5–5.1)
Sodium: 139 mEq/L (ref 135–145)

## 2022-08-29 LAB — HEPATIC FUNCTION PANEL
ALT: 9 U/L (ref 0–35)
AST: 17 U/L (ref 0–37)
Albumin: 4.3 g/dL (ref 3.5–5.2)
Alkaline Phosphatase: 50 U/L (ref 39–117)
Bilirubin, Direct: 0.1 mg/dL (ref 0.0–0.3)
Total Bilirubin: 0.8 mg/dL (ref 0.2–1.2)
Total Protein: 7.5 g/dL (ref 6.0–8.3)

## 2022-08-29 LAB — LIPID PANEL
Cholesterol: 176 mg/dL (ref 0–200)
HDL: 47.6 mg/dL (ref >39.00)
LDL Cholesterol: 105 mg/dL — ABNORMAL HIGH (ref 0–99)
NonHDL: 128.1
Total CHOL/HDL Ratio: 4
Triglycerides: 118 mg/dL (ref 0.0–149.0)
VLDL: 23.6 mg/dL (ref 0.0–40.0)

## 2022-08-29 LAB — TSH: TSH: 1.05 u[IU]/mL (ref 0.35–5.50)

## 2022-08-29 LAB — HEMOGLOBIN A1C: Hgb A1c MFr Bld: 6 % (ref 4.6–6.5)

## 2022-08-31 ENCOUNTER — Other Ambulatory Visit: Payer: Self-pay

## 2022-08-31 DIAGNOSIS — N1831 Chronic kidney disease, stage 3a: Secondary | ICD-10-CM

## 2022-09-02 ENCOUNTER — Encounter: Payer: Self-pay | Admitting: Internal Medicine

## 2022-09-02 NOTE — Assessment & Plan Note (Signed)
Continues on sotalol.  Stable.   

## 2022-09-02 NOTE — Assessment & Plan Note (Signed)
On benazepril 20mg bid.  On sotalol 40mg q day.   Also cardiology started her on hctz. Blood pressure doing well.  Continue current medications.  Follow metabolic panel.  

## 2022-09-02 NOTE — Assessment & Plan Note (Signed)
Avoid antiinflammatories.  Stay hydrated.  Follow metabolic panel.  Check metabolic panel.   

## 2022-09-02 NOTE — Assessment & Plan Note (Signed)
Low carb diet and exercise.  Follow met b and a1c.

## 2022-09-02 NOTE — Assessment & Plan Note (Addendum)
Upper symptoms controlled.  Continues protonix.  EGD 12/2018.  Recommended f/u EGD in 2 years.  Saw GI.  Last seen 02/2022 - recommended hold f/u EGD.   

## 2022-09-02 NOTE — Assessment & Plan Note (Signed)
S/p patella fracture as outlined.  Has seen ortho previously. Persistent pain. Refer to PT.

## 2022-09-02 NOTE — Assessment & Plan Note (Signed)
Followed by GI.  Follow liver function tests.  

## 2022-09-02 NOTE — Assessment & Plan Note (Signed)
Continue b12 injections.  

## 2022-09-02 NOTE — Assessment & Plan Note (Signed)
On thyroid replacement.  Follow tsh.  

## 2022-09-02 NOTE — Assessment & Plan Note (Signed)
Follow cbc.  

## 2022-09-02 NOTE — Assessment & Plan Note (Signed)
Continue to stay hydrated.  Stretches.  Follow.

## 2022-09-07 ENCOUNTER — Encounter: Payer: Self-pay | Admitting: Internal Medicine

## 2022-09-12 ENCOUNTER — Ambulatory Visit (INDEPENDENT_AMBULATORY_CARE_PROVIDER_SITE_OTHER): Payer: Medicare Other

## 2022-09-12 DIAGNOSIS — E538 Deficiency of other specified B group vitamins: Secondary | ICD-10-CM | POA: Diagnosis not present

## 2022-09-12 MED ORDER — CYANOCOBALAMIN 1000 MCG/ML IJ SOLN
1000.0000 ug | Freq: Once | INTRAMUSCULAR | Status: AC
  Administered 2022-09-12: 1000 ug via INTRAMUSCULAR

## 2022-09-12 NOTE — Progress Notes (Signed)
Pt arrived for B12 injection, given in L deltoid. Pt tolerated injection well, showed no signs of distress nor voiced any concerns.  ?

## 2022-10-01 ENCOUNTER — Other Ambulatory Visit (INDEPENDENT_AMBULATORY_CARE_PROVIDER_SITE_OTHER): Payer: Medicare Other

## 2022-10-01 DIAGNOSIS — N1831 Chronic kidney disease, stage 3a: Secondary | ICD-10-CM

## 2022-10-01 LAB — BASIC METABOLIC PANEL
BUN: 21 mg/dL (ref 6–23)
CO2: 24 mEq/L (ref 19–32)
Calcium: 10.4 mg/dL (ref 8.4–10.5)
Chloride: 100 mEq/L (ref 96–112)
Creatinine, Ser: 0.98 mg/dL (ref 0.40–1.20)
GFR: 52.45 mL/min — ABNORMAL LOW (ref >60.00)
Glucose, Bld: 103 mg/dL — ABNORMAL HIGH (ref 70–99)
Potassium: 4.4 mEq/L (ref 3.5–5.1)
Sodium: 134 mEq/L — ABNORMAL LOW (ref 135–145)

## 2022-10-04 ENCOUNTER — Ambulatory Visit: Payer: Medicare Other | Attending: Internal Medicine | Admitting: Physical Therapy

## 2022-10-04 ENCOUNTER — Encounter: Payer: Self-pay | Admitting: Physical Therapy

## 2022-10-04 ENCOUNTER — Other Ambulatory Visit: Payer: Self-pay

## 2022-10-04 DIAGNOSIS — M25561 Pain in right knee: Secondary | ICD-10-CM | POA: Insufficient documentation

## 2022-10-04 DIAGNOSIS — M6281 Muscle weakness (generalized): Secondary | ICD-10-CM | POA: Diagnosis not present

## 2022-10-04 DIAGNOSIS — R262 Difficulty in walking, not elsewhere classified: Secondary | ICD-10-CM | POA: Insufficient documentation

## 2022-10-04 NOTE — Therapy (Signed)
OUTPATIENT PHYSICAL THERAPY LOWER EXTREMITY EVALUATION   Patient Name: Lori [name] MRN: [id number] DOB:1934-01-13, 88 y.o., female Today's Date: 10/04/2022   PT End of Session - 10/04/22 1751     Visit Number 1    Number of Visits 20    Date for PT Re-Evaluation 12/13/22    Authorization Type Medicare    Authorization - Visit Number 1    Authorization - Number of Visits 20    Progress Note Due on Visit 10    PT Start Time 1630    PT Stop Time 5643    PT Time Calculation (min) 45 min    Activity Tolerance Patient tolerated treatment well    Behavior During Therapy [facility] for tasks assessed/performed             Past Medical History:  Diagnosis Date   Anemia    iron deficient   Arthritis    Autoimmune hepatitis ([place]) 05/10/2016   Hep C   B12 deficiency    Barrett esophagus 01/2016   Bradycardia    Chronic kidney disease    stage III   Diverticulosis    GERD (gastroesophageal reflux disease)    Hypertension    Hypothyroidism    Ulcer disease    Past Surgical History:  Procedure Laterality Date   CATARACT EXTRACTION W/PHACO Left 08/30/2021   Procedure: CATARACT EXTRACTION PHACO AND INTRAOCULAR LENS PLACEMENT ([place]) LEFT 8.21 01:00.3;  Surgeon: Leandrew Koyanagi, MD;  Location: [place];  Service: Ophthalmology;  Laterality: Left;   CATARACT EXTRACTION W/PHACO Right 09/13/2021   Procedure: CATARACT EXTRACTION PHACO AND INTRAOCULAR LENS PLACEMENT ([place]) RIGHT;  Surgeon: Leandrew Koyanagi, MD;  Location: [place];  Service: Ophthalmology;  Laterality: Right;  8.11 00:59.8   CHOLECYSTECTOMY N/A 06/25/2018   Procedure: LAPAROSCOPIC CHOLECYSTECTOMY WITH INTRAOPERATIVE CHOLANGIOGRAM;  Surgeon: Robert Bellow, MD;  Location: [facility] ORS;  Service: General;  Laterality: N/A;   ESOPHAGOGASTRODUODENOSCOPY (EGD) WITH PROPOFOL N/A 01/30/2016   Procedure: ESOPHAGOGASTRODUODENOSCOPY (EGD) WITH PROPOFOL;  Surgeon: Lollie Sails, MD;  Location:  [facility] ENDOSCOPY;  Service: Endoscopy;  Laterality: N/A;   ESOPHAGOGASTRODUODENOSCOPY (EGD) WITH PROPOFOL N/A 01/14/2018   Procedure: ESOPHAGOGASTRODUODENOSCOPY (EGD) WITH PROPOFOL;  Surgeon: Lollie Sails, MD;  Location: [facility] ENDOSCOPY;  Service: Endoscopy;  Laterality: N/A;   ESOPHAGOGASTRODUODENOSCOPY (EGD) WITH PROPOFOL N/A 05/20/2018   Procedure: ESOPHAGOGASTRODUODENOSCOPY (EGD) WITH PROPOFOL;  Surgeon: Lollie Sails, MD;  Location: [facility] ENDOSCOPY;  Service: Endoscopy;  Laterality: N/A;   ESOPHAGOGASTRODUODENOSCOPY (EGD) WITH PROPOFOL N/A 01/13/2019   Procedure: ESOPHAGOGASTRODUODENOSCOPY (EGD) WITH PROPOFOL;  Surgeon: Lollie Sails, MD;  Location: [facility] ENDOSCOPY;  Service: Endoscopy;  Laterality: N/A;   HERNIA REPAIR     lap hiatal hernia repair  07/08/08   LEFT OOPHORECTOMY  1993   benign ovarian cyst   TUBAL LIGATION  1981   Patient Active Problem List   Diagnosis Date Noted   Bladder prolapse, female, acquired 05/06/2022   Hematoma 10/22/2021   Right knee pain 10/13/2021   SVT (supraventricular tachycardia) 10/07/2021   PSVT (paroxysmal supraventricular tachycardia) 10/06/2021   Numbness in feet 06/18/2021   CKD (chronic kidney disease) 06/12/2021   Dizziness 04/10/2021   Chest tightness 02/07/2021   Diarrhea 10/09/2020   Abdominal discomfort 09/27/2019   Gall bladder polyp 05/07/2018   Breast tenderness 05/07/2018   Hyperglycemia 09/10/2016   Fullness of breast 09/10/2016   Autoimmune hepatitis ([place]) 05/10/2016   MGUS (monoclonal gammopathy of unknown significance) 06/06/2015   LUQ fullness 06/06/2015   Health  care maintenance 06/06/2015   Fatty tumor 03/07/2015   BMI 32.0-32.9,adult 01/02/2015   Abnormal liver function tests 01/02/2015   Hand cramps 11/15/2014   Leg cramps 06/13/2014   Osteopenia 03/02/2013   Hyperbilirubinemia 03/02/2013   Hypertension 12/22/2012   Gastroparesis 12/22/2012   Barrett's esophagus with esophagitis 12/22/2012    Hypothyroidism 12/22/2012   Anemia 12/22/2012   B12 deficiency 12/22/2012   CKD (chronic kidney disease) stage 3, GFR 30-59 ml/min ([place]) 12/22/2012    PCP: Dr. Einar Pheasant   REFERRING PROVIDER: Dr. Einar Pheasant   REFERRING DIAG: Imbalance   THERAPY DIAG:  Muscle weakness (generalized)  Difficulty in walking, not elsewhere classified  Rationale for Evaluation and Treatment Rehabilitation  ONSET DATE: 01/2022   SUBJECTIVE:   SUBJECTIVE STATEMENT: See pertinent   PERTINENT HISTORY: Pt was initially sent her by PCP for right knee pain from a patella fracture that she sustained back in February.  She occasionally feels pain in her left knee, but she does not identify this as a problem. Pt describes feeling like she is walking stiff where she is having to lift the right leg upstairs.  She feels afraid of falling and she walks slower as a result. She does not normally use a SPC with the exception of shopping in a grocery store.   PAIN:  Are you having pain? No  PRECAUTIONS: None  WEIGHT BEARING RESTRICTIONS No  FALLS:  Has patient fallen in last 6 months? No  LIVING ENVIRONMENT: Lives with: lives alone Lives in: House/apartment Stairs: No Has following equipment at home: Ramped entry  OCCUPATION: Retired   PLOF: Independent  PATIENT GOALS Wants to feel like she is walking smoother and feel like she is less likely to fall    OBJECTIVE:                BP 180/54 HR 58 SpO2 100     DIAGNOSTIC FINDINGS: CLINICAL DATA:  Right knee pain, fall   EXAM: RIGHT KNEE - 1-2 VIEW   COMPARISON:  None.   FINDINGS: Advanced tricompartment degenerative changes with joint space narrowing and spurring. Moderate joint effusion. No acute bony abnormality. Specifically, no fracture, subluxation, or dislocation.   IMPRESSION: No acute bony abnormality.  Degenerative changes and joint effusion.     Electronically Signed   By: Rolm Baptise M.D.   On: 10/16/2021  20:51  PATIENT SURVEYS:  FOTO 45/100 with target of 52   COGNITION:  Overall cognitive status: Within functional limits for tasks assessed     SENSATION: WFL    MUSCLE LENGTH: Hamstrings: Right 60 deg; Left 60 deg   POSTURE: rounded shoulders and forward head  PALPATION: TTP of anterior surface of bilateral knees   LOWER EXTREMITY ROM:   Active /PROM Right 10/04/2022 Left 10/04/2022  Hip flexion 120 120  Hip extension 30 30  Hip abduction 45 45  Hip adduction 30 30  Hip internal rotation 45 45  Hip external rotation 45 45  Knee flexion 110/120 110/120  Knee extension 0 0  Ankle dorsiflexion    Ankle plantarflexion    Ankle inversion    Ankle eversion     (Blank rows = not tested)     LOWER EXTREMITY MMT:  MMT Right eval Left eval  Hip flexion 4 4  Hip extension 4- 4-  Hip abduction 4- 4-  Hip adduction 3+ 3+  Hip internal rotation    Hip external rotation    Knee flexion 4 4  Knee extension  4 4  Ankle dorsiflexion    Ankle plantarflexion    Ankle inversion    Ankle eversion     (Blank rows = not tested)  LOWER EXTREMITY SPECIAL TESTS:  Hip special tests: Marcello Moores test: negative and Ely's test: positive   FUNCTIONAL TESTS:  5 times sit to stand: 19 sec  30 seconds chair stand test: 9 reps  Dynamic Gait Index: NT   GAIT: Distance walked: 10 meters  Assistive device utilized: None Level of assistance: Complete Independence Comments: Decreased step length on RLE     TODAY'S TREATMENT: Prone Quad Stretch 3 x 30 sec     PATIENT EDUCATION:  Education details: form and education for appropriate exercise and explanation of tests and measures  Person educated: Patient Education method: Consulting civil engineer, Media planner, Verbal cues, and Handouts Education comprehension: verbalized understanding, returned demonstration, and verbal cues required   HOME EXERCISE PROGRAM: Access Code: [email or web address] URL: [email or web address] Date:  10/04/2022 Prepared by: Bradly Chris  Exercises - Sit to Stand  - 1 x daily - 3 x weekly - 3 sets - 10 reps - Prone Quadriceps Stretch with Strap  - 1 x daily - 3 reps - 30-60 sec  hold  ASSESSMENT:  CLINICAL IMPRESSION: Patient is a 88 y.o. white female who was seen today for physical therapy evaluation and treatment for walking with unsteadiness and fear of falling.  She exhibits decreased LE strength and weakness that places her at an increased risk for falls. She also shows decreased hip and knee strength that likely is contributing to her unsteady gait. Further testing to occur next session due to time limitations. She will benefit from skilled PT to increase her LE strength and endurance and improve her dynamic balance to decrease risk of falls to continue to remain an independent community dwelling adult.    OBJECTIVE IMPAIRMENTS Abnormal gait, decreased balance, decreased ROM, and decreased strength.   ACTIVITY LIMITATIONS squatting, stairs, and locomotion level  PARTICIPATION LIMITATIONS: shopping, community activity, and yard work  [place] Age and 3+ comorbidities: chronic kidney disease, HTN, and h/o right knee patella fracture   are also affecting patient's functional outcome.   REHAB POTENTIAL: Good  CLINICAL DECISION MAKING: Stable/uncomplicated  EVALUATION COMPLEXITY: Low   GOALS: Goals reviewed with patient? No  SHORT TERM GOALS: Target date: 10/18/2022  Pt will be independent with HEP in order to improve strength and balance in order to decrease fall risk and improve function at home and work. Baseline: NT  Goal status: INITIAL  2.  Pt will decrease 5TSTS by at least 3 seconds in order to demonstrate clinically significant improvement in LE strength.  Baseline: 9 sec  Goal status: INITIAL  3.  Pt will increase 10MWT by at least 0.13 m/s in order to demonstrate clinically significant improvement in community ambulation. Baseline: NT  Goal status:  INITIAL    LONG TERM GOALS: Target date: 12/13/2022   Patient will have improved function and activity level as evidenced by an increase in FOTO score by 10 points or more. Baseline: 43/100 with target of 52  Goal status: INITIAL  2.  Patient will improve hip and knee strength by 1/3 MMT (I.e 4- to 4) for improved hip stability to improve steadiness of gait to decrease risk of falling.  Baseline: Hip Adduction R/L 3+/3+, Hip Abduction R/L 4-/4-, Hip Ext R/L  4-/-, Knee Flex R/L 4/4, Knee Ext R/L4/4 Goal status: INITIAL  3.  Patient will demonstrate reduced falls risk as evidenced by Dynamic  Gait Index (DGI) >19/24. Baseline: NT  Goal status: INITIAL  4.  Patient will improve 5 x STS to 11 sec to demonstrate improved LE strength that places her at a decreased risk for falls. Baseline: 19 sec  Goal status: INITIAL  5.  Patient will demonstrate a gait speed < 1.0 m/sec to show that she capable of a gait speed that places her at a decreased risk for falls.  Baseline: NT  Goal status: INITIAL  6.  Patient will perform 8>= sit to stands within 30 sec to show LE endurance that places her at a decreased risk for falls.  Baseline: 9 reps  Goal status: INITIAL   PLAN: PT FREQUENCY: 1-2x/week  PT DURATION: 10 weeks  PLANNED INTERVENTIONS: Therapeutic exercises, Neuromuscular re-education, Balance training, Gait training, Joint mobilization, Joint manipulation, Stair training, DME instructions, Aquatic Therapy, Dry Needling, Spinal manipulation, Spinal mobilization, Cryotherapy, Moist heat, Manual therapy, and Re-evaluation  PLAN FOR NEXT SESSION: DGI, 32mT  and start LE strengthening and ROM exercises    DBradly ChrisPT, DPT  10/04/2022, 5:54 PM

## 2022-10-08 ENCOUNTER — Ambulatory Visit: Payer: Medicare Other | Admitting: Physical Therapy

## 2022-10-09 ENCOUNTER — Encounter: Payer: Self-pay | Admitting: Physical Therapy

## 2022-10-09 ENCOUNTER — Ambulatory Visit: Payer: Medicare Other | Admitting: Physical Therapy

## 2022-10-09 DIAGNOSIS — M6281 Muscle weakness (generalized): Secondary | ICD-10-CM

## 2022-10-09 DIAGNOSIS — R262 Difficulty in walking, not elsewhere classified: Secondary | ICD-10-CM | POA: Diagnosis not present

## 2022-10-09 DIAGNOSIS — M25561 Pain in right knee: Secondary | ICD-10-CM | POA: Diagnosis not present

## 2022-10-09 NOTE — Therapy (Signed)
OUTPATIENT PHYSICAL THERAPY TREATMENT NOTE   Patient Name: Lori [name] MRN: [id number] DOB:[birth year], 88 y.o., female Today's Date: 10/09/2022  PCP: Dr. Einar Pheasant  REFERRING PROVIDER: Dr. Einar Pheasant   END OF SESSION:   PT End of Session - 10/09/22 1021     Visit Number 2    Number of Visits 20    Date for PT Re-Evaluation 12/13/22    Authorization Type Medicare    Authorization - Visit Number 2    Authorization - Number of Visits 20    Progress Note Due on Visit 10    PT Start Time 4193    PT Stop Time 1100    PT Time Calculation (min) 45 min    Activity Tolerance Patient tolerated treatment well    Behavior During Therapy [facility] for tasks assessed/performed             Past Medical History:  Diagnosis Date   Anemia    iron deficient   Arthritis    Autoimmune hepatitis ([place]) 05/10/2016   Hep C   B12 deficiency    Barrett esophagus 01/2016   Bradycardia    Chronic kidney disease    stage III   Diverticulosis    GERD (gastroesophageal reflux disease)    Hypertension    Hypothyroidism    Ulcer disease    Past Surgical History:  Procedure Laterality Date   CATARACT EXTRACTION W/PHACO Left 08/30/2021   Procedure: CATARACT EXTRACTION PHACO AND INTRAOCULAR LENS PLACEMENT ([place]) LEFT 8.21 01:00.3;  Surgeon: Leandrew Koyanagi, MD;  Location: [facility];  Service: Ophthalmology;  Laterality: Left;   CATARACT EXTRACTION W/PHACO Right 09/13/2021   Procedure: CATARACT EXTRACTION PHACO AND INTRAOCULAR LENS PLACEMENT ([place]) RIGHT;  Surgeon: Leandrew Koyanagi, MD;  Location: [place];  Service: Ophthalmology;  Laterality: Right;  8.11 00:59.8   CHOLECYSTECTOMY N/A 06/25/2018   Procedure: LAPAROSCOPIC CHOLECYSTECTOMY WITH INTRAOPERATIVE CHOLANGIOGRAM;  Surgeon: Robert Bellow, MD;  Location: [facility];  Service: General;  Laterality: N/A;   ESOPHAGOGASTRODUODENOSCOPY (EGD) WITH PROPOFOL N/A 01/30/2016   Procedure:  ESOPHAGOGASTRODUODENOSCOPY (EGD) WITH PROPOFOL;  Surgeon: Lollie Sails, MD;  Location: [facility] ENDOSCOPY;  Service: Endoscopy;  Laterality: N/A;   ESOPHAGOGASTRODUODENOSCOPY (EGD) WITH PROPOFOL N/A 01/14/2018   Procedure: ESOPHAGOGASTRODUODENOSCOPY (EGD) WITH PROPOFOL;  Surgeon: Lollie Sails, MD;  Location: [facility] ENDOSCOPY;  Service: Endoscopy;  Laterality: N/A;   ESOPHAGOGASTRODUODENOSCOPY (EGD) WITH PROPOFOL N/A 05/20/2018   Procedure: ESOPHAGOGASTRODUODENOSCOPY (EGD) WITH PROPOFOL;  Surgeon: Lollie Sails, MD;  Location[facility]COPY;  Service: Endoscopy;  Laterality: N/A;   ESOPHAGOGASTRODUODENOSCOPY (EGD) WITH PROPOFOL N/A 01/13/2019   Procedure: ESOPHAGOGASTRODUODENOSCOPY (EGD) WITH PROPOFOL;  Surgeon: Lollie Sails, MD;  Location: [facility] ENDOSCOPY;  Service: Endoscopy;  Laterality: N/A;   HERNIA REPAIR     lap hiatal hernia repair  07/08/08   LEFT OOPHORECTOMY  1993   benign ovarian cyst   TUBAL LIGATION  1981   Patient Active Problem List   Diagnosis Date Noted   Bladder prolapse, female, acquired 05/06/2022   Hematoma 10/22/2021   Right knee pain 10/13/2021   SVT (supraventricular tachycardia) 10/07/2021   PSVT (paroxysmal supraventricular tachycardia) 10/06/2021   Numbness in feet 06/18/2021   CKD (chronic kidney disease) 06/12/2021   Dizziness 04/10/2021   Chest tightness 02/07/2021   Diarrhea 10/09/2020   Abdominal discomfort 09/27/2019   Gall bladder polyp 05/07/2018   Breast tenderness 05/07/2018   Hyperglycemia 09/10/2016   Fullness of breast 09/10/2016   Autoimmune hepatitis ([place]) 05/10/2016  MGUS (monoclonal gammopathy of unknown significance) 06/06/2015   LUQ fullness 06/06/2015   Health care maintenance 06/06/2015   Fatty tumor 03/07/2015   BMI 32.0-32.9,adult 01/02/2015   Abnormal liver function tests 01/02/2015   Hand cramps 11/15/2014   Leg cramps 06/13/2014   Osteopenia 03/02/2013   Hyperbilirubinemia 03/02/2013   Hypertension 12/22/2012    Gastroparesis 12/22/2012   Barrett's esophagus with esophagitis 12/22/2012   Hypothyroidism 12/22/2012   Anemia 12/22/2012   B12 deficiency 12/22/2012   CKD (chronic kidney disease) stage 3, GFR 30-59 ml/min (HCC) 12/22/2012    REFERRING DIAG: M25.561 (ICD-10-CM) - Right knee pain, unspecified chronicity  THERAPY DIAG:  Muscle weakness (generalized)  Difficulty in walking, not elsewhere classified  Rationale for Evaluation and Treatment Rehabilitation  PERTINENT HISTORY: Pt was initially sent her by PCP for right knee pain from a patella fracture that she sustained back in February.  She occasionally feels pain in her left knee, but she does not identify this as a problem. Pt describes feeling like she is walking stiff where she is having to lift the right leg upstairs.  She feels afraid of falling and she walks slower as a result. She does not normally use a SPC with the exception of shopping in a grocery store.   PRECAUTIONS: Falls   SUBJECTIVE: Patient reports that she feels like the exercises are making her walk better. She has not had any falls since last visit.   PAIN:  Are you having pain? No   OBJECTIVE: (objective measures completed at initial evaluation unless otherwise dated)  OBJECTIVE:                 BP 180/54 HR 58 SpO2 100      DIAGNOSTIC FINDINGS: CLINICAL DATA:  Right knee pain, fall   EXAM: RIGHT KNEE - 1-2 VIEW   COMPARISON:  None.   FINDINGS: Advanced tricompartment degenerative changes with joint space narrowing and spurring. Moderate joint effusion. No acute bony abnormality. Specifically, no fracture, subluxation, or dislocation.   IMPRESSION: No acute bony abnormality.  Degenerative changes and joint effusion.     Electronically Signed   By: Rolm Baptise M.D.   On: 10/16/2021 20:51   PATIENT SURVEYS:  FOTO 45/100 with target of 52    COGNITION:           Overall cognitive status: Within functional limits for tasks assessed                           SENSATION: WFL       MUSCLE LENGTH: Hamstrings: Right 60 deg; Left 60 deg     POSTURE: rounded shoulders and forward head   PALPATION: TTP of anterior surface of bilateral knees    LOWER EXTREMITY ROM:     Active /PROM Right 10/04/2022 Left 10/04/2022  Hip flexion 120 120  Hip extension 30 30  Hip abduction 45 45  Hip adduction 30 30  Hip internal rotation 45 45  Hip external rotation 45 45  Knee flexion 110/120 110/120  Knee extension 0 0  Ankle dorsiflexion      Ankle plantarflexion      Ankle inversion      Ankle eversion       (Blank rows = not tested)        LOWER EXTREMITY MMT:   MMT Right eval Left eval  Hip flexion 4 4  Hip extension 4- 4-  Hip abduction 4- 4-  Hip  adduction 3+ 3+  Hip internal rotation      Hip external rotation      Knee flexion 4 4  Knee extension 4 4  Ankle dorsiflexion      Ankle plantarflexion      Ankle inversion      Ankle eversion       (Blank rows = not tested)   LOWER EXTREMITY SPECIAL TESTS:  Hip special tests: Marcello Moores test: negative and Ely's test: positive    FUNCTIONAL TESTS:  5 times sit to stand: 19 sec  30 seconds chair stand test: 9 reps  Dynamic Gait Index: NT    GAIT: Distance walked: 10 meters  Assistive device utilized: None Level of assistance: Complete Independence Comments: Decreased step length on RLE        TODAY'S TREATMENT:  10/09/22:              Nu-Step Level 6 for arms and feet and resistance of 2 for 5 min             10 Meter Walk Test   Gait Speed:   Avg=  20 sec - Normal Gait speed Avg= 0.50   m/sec   Fast Gait Speed: Avg=  16 sec -Fast Gait speed time= 0.63 m/sec   -> 1 m/sec [employer] and cross street safely and normal WS <1 m/sec Need Intervention to reduce falls risk  0.12 m/sec improvement represents a statistically significant improvement in gait speed     - <0.6 m/sec associated with severe impairment; likely that pt is capable only of  limited community or household ambulation; increased likelihood of dependence with ADLs.                           DGI: 15/24                 Mild Impairment: Gait on level surface, Change in gait speed, horizontal and vertical head turns, gait and pivot turns                Moderate Impairment: Obstacles, Stairs               Sit to Stand with 8 lb jug of water from 20 inch mat height 3 x 8 reps              Standing Hip Abduction    Initial  Prone Quad Stretch 3 x 30 sec        PATIENT EDUCATION:  Education details: form and education for appropriate exercise and explanation of tests and measures  Person educated: Patient Education method: Consulting civil engineer, Demonstration, Verbal cues, and Handouts Education comprehension: verbalized understanding, returned demonstration, and verbal cues required     HOME EXERCISE PROGRAM: Access Code: [email or web address] URL: [email or web address] Date: 10/09/2022 Prepared by: Bradly Chris  Exercises - Sit to Stand  - 1 x daily - 3 x weekly - 3 sets - 10 reps - Prone Quadriceps Stretch with Strap  - 1 x daily - 3 reps - 30-60 sec  hold - Standing Hip Abduction with Counter Support  - 3 x weekly - 3 sets - 10 reps   ASSESSMENT:   CLINICAL IMPRESSION:  Pt presents for f/u for right knee pain and imbalance. She exhibits an improvement in LE strength with ability to complete sit to stand with increased resistance. Pt's performance on DGI indicates that she is at risk of falling with mild impairment with change in  gait speed, head turns, and stop and turn. She showed moderate impairments with stairs and stepping over obstacles. She will benefit from skilled PT to increase her LE strength and endurance and improve her dynamic balance to decrease risk of falls to continue to remain an independent community dwelling adult.     OBJECTIVE IMPAIRMENTS Abnormal gait, decreased balance, decreased ROM, and decreased strength.    ACTIVITY LIMITATIONS  squatting, stairs, and locomotion level   PARTICIPATION LIMITATIONS: shopping, community activity, and yard work   [place] Age and 3+ comorbidities: chronic kidney disease, HTN, and h/o right knee patella fracture   are also affecting patient's functional outcome.    REHAB POTENTIAL: Good   CLINICAL DECISION MAKING: Stable/uncomplicated   EVALUATION COMPLEXITY: Low     GOALS: Goals reviewed with patient? No   SHORT TERM GOALS: Target date: 10/18/2022  Pt will be independent with HEP in order to improve strength and balance in order to decrease fall risk and improve function at home and work. Baseline: NT  Goal status: Ongoing    2.  Pt will decrease 5TSTS by at least 3 seconds in order to demonstrate clinically significant improvement in LE strength.  Baseline: 9 sec  Goal status: Ongoing    3.  Pt will increase 10MWT by at least 0.13 m/s in order to demonstrate clinically significant improvement in community ambulation. Baseline: 0.50 m/sec  Goal status: Ongoing        LONG TERM GOALS: Target date: 12/13/2022    Patient will have improved function and activity level as evidenced by an increase in FOTO score by 10 points or more. Baseline: 43/100 with target of 52  Goal status: Ongoing    2.  Patient will improve hip and knee strength by 1/3 MMT (I.e 4- to 4) for improved hip stability to improve steadiness of gait to decrease risk of falling.  Baseline: Hip Adduction R/L 3+/3+, Hip Abduction R/L 4-/4-, Hip Ext R/L  4-/-, Knee Flex R/L 4/4, Knee Ext R/L4/4 Goal status: Ongoing    3.  Patient will demonstrate reduced falls risk as evidenced by Dynamic Gait Index (DGI) >19/24. Baseline:15/24 Goal status: Ongoing    4.  Patient will improve 5 x STS to 11 sec to demonstrate improved LE strength that places her at a decreased risk for falls. Baseline: 19 sec  Goal status: Ongoing    5.  Patient will demonstrate a gait speed < 1.0 m/sec to show that she capable of  a gait speed that places her at a decreased risk for falls.  Baseline: 0.50 m/sec  Goal status: Ongoing    6.  Patient will perform 8>= sit to stands within 30 sec to show LE endurance that places her at a decreased risk for falls.  Baseline: 9 reps  Goal status: Ongoing      PLAN: PT FREQUENCY: 1-2x/week   PT DURATION: 10 weeks   PLANNED INTERVENTIONS: Therapeutic exercises, Neuromuscular re-education, Balance training, Gait training, Joint mobilization, Joint manipulation, Stair training, DME instructions, Aquatic Therapy, Dry Needling, Spinal manipulation, Spinal mobilization, Cryotherapy, Moist heat, Manual therapy, and Re-evaluation   PLAN FOR NEXT SESSION: Continue to progress LE strengthening exercises with OTAGO exercises and begin dynamic balance exercises      Bradly Chris PT, DPT  10/09/2022, 10:21 AM

## 2022-10-10 ENCOUNTER — Ambulatory Visit: Payer: Medicare Other | Admitting: Physical Therapy

## 2022-10-11 ENCOUNTER — Encounter: Payer: Self-pay | Admitting: Physical Therapy

## 2022-10-11 ENCOUNTER — Ambulatory Visit: Payer: Medicare Other | Admitting: Physical Therapy

## 2022-10-11 DIAGNOSIS — R262 Difficulty in walking, not elsewhere classified: Secondary | ICD-10-CM

## 2022-10-11 DIAGNOSIS — M25561 Pain in right knee: Secondary | ICD-10-CM | POA: Diagnosis not present

## 2022-10-11 DIAGNOSIS — M6281 Muscle weakness (generalized): Secondary | ICD-10-CM | POA: Diagnosis not present

## 2022-10-11 NOTE — Therapy (Signed)
OUTPATIENT PHYSICAL THERAPY TREATMENT NOTE   Patient Name: Lori [name] MRN: [id number] DOB:06/29/1934, 88 y.o., female Today's Date: 10/11/2022  PCP: Dr. Einar Pheasant  REFERRING PROVIDER: Dr. Einar Pheasant   END OF SESSION:   PT End of Session - 10/11/22 1157     Visit Number 3    Number of Visits 20    Date for PT Re-Evaluation 12/13/22    Authorization Type Medicare    Authorization - Visit Number 3    Authorization - Number of Visits 20    Progress Note Due on Visit 10    PT Start Time 1150    PT Stop Time 1230    PT Time Calculation (min) 40 min    Activity Tolerance Patient tolerated treatment well    Behavior During Therapy [facility] for tasks assessed/performed             Past Medical History:  Diagnosis Date   Anemia    iron deficient   Arthritis    Autoimmune hepatitis ([place]) 05/10/2016   Hep C   B12 deficiency    Barrett esophagus 01/2016   Bradycardia    Chronic kidney disease    stage III   Diverticulosis    GERD (gastroesophageal reflux disease)    Hypertension    Hypothyroidism    Ulcer disease    Past Surgical History:  Procedure Laterality Date   CATARACT EXTRACTION W/PHACO Left 08/30/2021   Procedure: CATARACT EXTRACTION PHACO AND INTRAOCULAR LENS PLACEMENT ([place]) LEFT 8.21 01:00.3;  Surgeon: Leandrew Koyanagi, MD;  Location: [place];  Service: Ophthalmology;  Laterality: Left;   CATARACT EXTRACTION W/PHACO Right 09/13/2021   Procedure: CATARACT EXTRACTION PHACO AND INTRAOCULAR LENS PLACEMENT ([place]) RIGHT;  Surgeon: Leandrew Koyanagi, MD;  Location: [place];  Service: Ophthalmology;  Laterality: Right;  8.11 00:59.8   CHOLECYSTECTOMY N/A 06/25/2018   Procedure: LAPAROSCOPIC CHOLECYSTECTOMY WITH INTRAOPERATIVE CHOLANGIOGRAM;  Surgeon: Robert Bellow, MD;  Location: [facility];  Service: General;  Laterality: N/A;   ESOPHAGOGASTRODUODENOSCOPY (EGD) WITH PROPOFOL N/A 01/30/2016   Procedure:  ESOPHAGOGASTRODUODENOSCOPY (EGD) WITH PROPOFOL;  Surgeon: Lollie Sails, MD;  Location: [facility] ENDOSCOPY;  Service: Endoscopy;  Laterality: N/A;   ESOPHAGOGASTRODUODENOSCOPY (EGD) WITH PROPOFOL N/A 01/14/2018   Procedure: ESOPHAGOGASTRODUODENOSCOPY (EGD) WITH PROPOFOL;  Surgeon: Lollie Sails, MD;  Location: [facility] ENDOSCOPY;  Service: Endoscopy;  Laterality: N/A;   ESOPHAGOGASTRODUODENOSCOPY (EGD) WITH PROPOFOL N/A 05/20/2018   Procedure: ESOPHAGOGASTRODUODENOSCOPY (EGD) WITH PROPOFOL;  Surgeon: Lollie Sails, MD;  Location: [facility] ENDOSCOPY;  Service: Endoscopy;  Laterality: N/A;   ESOPHAGOGASTRODUODENOSCOPY (EGD) WITH PROPOFOL N/A 01/13/2019   Procedure: ESOPHAGOGASTRODUODENOSCOPY (EGD) WITH PROPOFOL;  Surgeon: Lollie Sails, MD;  Location: [facility] ENDOSCOPY;  Service: Endoscopy;  Laterality: N/A;   HERNIA REPAIR     lap hiatal hernia repair  07/08/08   LEFT OOPHORECTOMY  1993   benign ovarian cyst   TUBAL LIGATION  1981   Patient Active Problem List   Diagnosis Date Noted   Bladder prolapse, female, acquired 05/06/2022   Hematoma 10/22/2021   Right knee pain 10/13/2021   SVT (supraventricular tachycardia) 10/07/2021   PSVT (paroxysmal supraventricular tachycardia) 10/06/2021   Numbness in feet 06/18/2021   CKD (chronic kidney disease) 06/12/2021   Dizziness 04/10/2021   Chest tightness 02/07/2021   Diarrhea 10/09/2020   Abdominal discomfort 09/27/2019   Gall bladder polyp 05/07/2018   Breast tenderness 05/07/2018   Hyperglycemia 09/10/2016   Fullness of breast 09/10/2016   Autoimmune hepatitis ([place]) 05/10/2016  MGUS (monoclonal gammopathy of unknown significance) 06/06/2015   LUQ fullness 06/06/2015   Health care maintenance 06/06/2015   Fatty tumor 03/07/2015   BMI 32.0-32.9,adult 01/02/2015   Abnormal liver function tests 01/02/2015   Hand cramps 11/15/2014   Leg cramps 06/13/2014   Osteopenia 03/02/2013   Hyperbilirubinemia 03/02/2013   Hypertension 12/22/2012    Gastroparesis 12/22/2012   Barrett's esophagus with esophagitis 12/22/2012   Hypothyroidism 12/22/2012   Anemia 12/22/2012   B12 deficiency 12/22/2012   CKD (chronic kidney disease) stage 3, GFR 30-59 ml/min (HCC) 12/22/2012    REFERRING DIAG: M25.561 (ICD-10-CM) - Right knee pain, unspecified chronicity  THERAPY DIAG:  Muscle weakness (generalized)  Difficulty in walking, not elsewhere classified  Rationale for Evaluation and Treatment Rehabilitation  PERTINENT HISTORY: Pt was initially sent her by PCP for right knee pain from a patella fracture that she sustained back in February.  She occasionally feels pain in her left knee, but she does not identify this as a problem. Pt describes feeling like she is walking stiff where she is having to lift the right leg upstairs.  She feels afraid of falling and she walks slower as a result. She does not normally use a SPC with the exception of shopping in a grocery store.   PRECAUTIONS: Falls   SUBJECTIVE: Patient reports feeling increased pain in her right inner groin after last session and she feels this mostly when she is performing exercises.   PAIN:  Are you having pain? No   OBJECTIVE: (objective measures completed at initial evaluation unless otherwise dated)  OBJECTIVE:                 BP 180/54 HR 58 SpO2 100      DIAGNOSTIC FINDINGS: CLINICAL DATA:  Right knee pain, fall   EXAM: RIGHT KNEE - 1-2 VIEW   COMPARISON:  None.   FINDINGS: Advanced tricompartment degenerative changes with joint space narrowing and spurring. Moderate joint effusion. No acute bony abnormality. Specifically, no fracture, subluxation, or dislocation.   IMPRESSION: No acute bony abnormality.  Degenerative changes and joint effusion.     Electronically Signed   By: Rolm Baptise M.D.   On: 10/16/2021 20:51   PATIENT SURVEYS:  FOTO 45/100 with target of 52    COGNITION:           Overall cognitive status: Within functional limits for tasks  assessed                          SENSATION: WFL       MUSCLE LENGTH: Hamstrings: Right 60 deg; Left 60 deg     POSTURE: rounded shoulders and forward head   PALPATION: TTP of anterior surface of bilateral knees    LOWER EXTREMITY ROM:     Active /PROM Right 10/04/2022 Left 10/04/2022  Hip flexion 120 120  Hip extension 30 30  Hip abduction 45 45  Hip adduction 30 30  Hip internal rotation 45 45  Hip external rotation 45 45  Knee flexion 110/120 110/120  Knee extension 0 0  Ankle dorsiflexion      Ankle plantarflexion      Ankle inversion      Ankle eversion       (Blank rows = not tested)        LOWER EXTREMITY MMT:   MMT Right eval Left eval  Hip flexion 4 4  Hip extension 4- 4-  Hip abduction 4- 4-  Hip adduction 3+ 3+  Hip internal rotation      Hip external rotation      Knee flexion 4 4  Knee extension 4 4  Ankle dorsiflexion      Ankle plantarflexion      Ankle inversion      Ankle eversion       (Blank rows = not tested)   LOWER EXTREMITY SPECIAL TESTS:  Hip special tests: Marcello Moores test: negative and Ely's test: positive    FUNCTIONAL TESTS:  5 times sit to stand: 19 sec  30 seconds chair stand test: 9 reps  Dynamic Gait Index: NT    GAIT: Distance walked: 10 meters  Assistive device utilized: None Level of assistance: Complete Independence Comments: Decreased step length on RLE        TODAY'S TREATMENT:  10/11/22:              TM with BUE support for 5 min at 1.0 mph              Standing Right groin stretch 3 x 30 sec               -mod VC to sequence exercise               Standing Heel Raises with BUE support 1 x 10               Standing Heel Raises with 1 UE support 1 x 10               Standing Heel Raises off of 1 foot step 2 x 10                              Single leg heel raises with BUE support 2 x 10                 Sit to stand with jug of water from 20 inch mat height 1 x 10                Sit to stand with two  #8 DBs 1 x 10                                Standing Marches with BUE support 2 x 10                 Standing Marches with 1 UE support 2 x 10                 Standing Marches with no UE support 2 x 10                10/09/22:              Nu-Step Level 6 for arms and feet and resistance of 2 for 5 min             10 Meter Walk Test   Gait Speed:   Avg=  20 sec - Normal Gait speed Avg= 0.50   m/sec   Fast Gait Speed: Avg=  16 sec -Fast Gait speed time= 0.63 m/sec   -> 1 m/sec [employer] and cross street safely and normal WS <1 m/sec Need Intervention to reduce falls risk  0.12 m/sec improvement represents a statistically significant improvement in gait speed     - <0.6 m/sec associated with severe  impairment; likely that pt is capable only of limited community or household ambulation; increased likelihood of dependence with ADLs.                           DGI: 15/24                 Mild Impairment: Gait on level surface, Change in gait speed, horizontal and vertical head turns, gait and pivot turns                Moderate Impairment: Obstacles, Stairs               Sit to Stand with 8 lb jug of water from 20 inch mat height 3 x 8 reps              Standing Hip Abduction    Initial  Prone Quad Stretch 3 x 30 sec        PATIENT EDUCATION:  Education details: form and education for appropriate exercise and explanation of tests and measures  Person educated: Patient Education method: Consulting civil engineer, Demonstration, Verbal cues, and Handouts Education comprehension: verbalized understanding, returned demonstration, and verbal cues required     HOME EXERCISE PROGRAM: Access Code: [email or web address] URL: [email or web address] Date: 10/11/2022 Prepared by: Bradly Chris  Exercises - Sit to Stand  - 1 x daily - 3 x weekly - 3 sets - 10 reps - Prone Quadriceps Stretch with Strap  - 1 x daily - 3 reps - 30-60 sec  hold - Standing Hip Abduction with Counter Support   - 3 x weekly - 3 sets - 10 reps - Standing March  - 3 x weekly - 3 sets - 10 reps - Single Leg Heel Raise with Counter Support  - 3 x weekly - 3 sets - 10 reps - Side Lunge Adductor Stretch  - 1 x daily - 3 reps - 30 hold  ASSESSMENT:   CLINICAL IMPRESSION:  Pt shows improvement with LE strength with ability to perform sit to stand with increased resistance using an additional water jug. She has also started to perform marches to improve hip flexion on RLE for improve stair negotiation and gait mechanics and she was able to perform exercise without compensation and using UE support. She will continue to benefit from skilled PT to increase her LE strength and endurance and improve her dynamic balance to decrease risk of falls to continue to remain an independent community dwelling adult.    OBJECTIVE IMPAIRMENTS Abnormal gait, decreased balance, decreased ROM, and decreased strength.    ACTIVITY LIMITATIONS squatting, stairs, and locomotion level   PARTICIPATION LIMITATIONS: shopping, community activity, and yard work   [name] Age and 3+ comorbidities: chronic kidney disease, HTN, and h/o right knee patella fracture   are also affecting patient's functional outcome.    REHAB POTENTIAL: Good   CLINICAL DECISION MAKING: Stable/uncomplicated   EVALUATION COMPLEXITY: Low     GOALS: Goals reviewed with patient? No   SHORT TERM GOALS: Target date: 10/18/2022  Pt will be independent with HEP in order to improve strength and balance in order to decrease fall risk and improve function at home and work. Baseline: NT  Goal status: Ongoing    2.  Pt will decrease 5TSTS by at least 3 seconds in order to demonstrate clinically significant improvement in LE strength.  Baseline: 9 sec  Goal status: Ongoing    3.  Pt will increase 10MWT by at least  0.13 m/s in order to demonstrate clinically significant improvement in community ambulation. Baseline: 0.50 m/sec  Goal status: Ongoing         LONG TERM GOALS: Target date: 12/13/2022    Patient will have improved function and activity level as evidenced by an increase in FOTO score by 10 points or more. Baseline: 43/100 with target of 52  Goal status: Ongoing    2.  Patient will improve hip and knee strength by 1/3 MMT (I.e 4- to 4) for improved hip stability to improve steadiness of gait to decrease risk of falling.  Baseline: Hip Adduction R/L 3+/3+, Hip Abduction R/L 4-/4-, Hip Ext R/L  4-/-, Knee Flex R/L 4/4, Knee Ext R/L4/4 Goal status: Ongoing    3.  Patient will demonstrate reduced falls risk as evidenced by Dynamic Gait Index (DGI) >19/24. Baseline:15/24 Goal status: Ongoing    4.  Patient will improve 5 x STS to 11 sec to demonstrate improved LE strength that places her at a decreased risk for falls. Baseline: 19 sec  Goal status: Ongoing    5.  Patient will demonstrate a gait speed < 1.0 m/sec to show that she capable of a gait speed that places her at a decreased risk for falls.  Baseline: 0.50 m/sec  Goal status: Ongoing    6.  Patient will perform 8>= sit to stands within 30 sec to show LE endurance that places her at a decreased risk for falls.  Baseline: 9 reps  Goal status: Ongoing      PLAN: PT FREQUENCY: 1-2x/week   PT DURATION: 10 weeks   PLANNED INTERVENTIONS: Therapeutic exercises, Neuromuscular re-education, Balance training, Gait training, Joint mobilization, Joint manipulation, Stair training, DME instructions, Aquatic Therapy, Dry Needling, Spinal manipulation, Spinal mobilization, Cryotherapy, Moist heat, Manual therapy, and Re-evaluation   PLAN FOR NEXT SESSION: Continue to progress LE strengthening exercises with OTAGO exercises and begin dynamic balance exercises      Bradly Chris PT, DPT  10/11/2022, 11:57 AM

## 2022-10-12 ENCOUNTER — Ambulatory Visit (INDEPENDENT_AMBULATORY_CARE_PROVIDER_SITE_OTHER): Payer: Medicare Other

## 2022-10-12 DIAGNOSIS — E538 Deficiency of other specified B group vitamins: Secondary | ICD-10-CM

## 2022-10-12 DIAGNOSIS — Z23 Encounter for immunization: Secondary | ICD-10-CM

## 2022-10-12 MED ORDER — CYANOCOBALAMIN 1000 MCG/ML IJ SOLN: 1000.0000 ug | Freq: Once | INTRAMUSCULAR | 0 refills | Status: DC

## 2022-10-12 MED ORDER — CYANOCOBALAMIN 1000 MCG/ML IJ SOLN
1000.0000 ug | Freq: Once | INTRAMUSCULAR | Status: AC
  Administered 2022-10-12: 1000 ug via INTRAMUSCULAR

## 2022-10-12 NOTE — Progress Notes (Signed)
Patient arrived for B12 injection. Given B12 injection in left arm. Patient tolerated B12 injection well. Patient did not show any signs of distress or voiced any concerns.Patient wanted a Flu vaccine. High dose flu vaccine in right arm. Patient tolerated well. Patient did not show any signs of distress or voice any concerns.

## 2022-10-16 ENCOUNTER — Ambulatory Visit: Payer: Medicare Other | Admitting: Physical Therapy

## 2022-10-16 ENCOUNTER — Encounter: Payer: Self-pay | Admitting: Physical Therapy

## 2022-10-16 DIAGNOSIS — R262 Difficulty in walking, not elsewhere classified: Secondary | ICD-10-CM | POA: Diagnosis not present

## 2022-10-16 DIAGNOSIS — M6281 Muscle weakness (generalized): Secondary | ICD-10-CM

## 2022-10-16 DIAGNOSIS — M25561 Pain in right knee: Secondary | ICD-10-CM | POA: Diagnosis not present

## 2022-10-16 NOTE — Therapy (Signed)
OUTPATIENT PHYSICAL THERAPY TREATMENT NOTE   Patient Name: Lori [name] MRN: [id number] DOB:[birth year], 88 y.o., female Today's Date: 10/16/2022  PCP: Dr. Einar Pheasant  REFERRING PROVIDER: Dr. Einar Pheasant   END OF SESSION:   PT End of Session - 10/16/22 1110     Visit Number 4    Number of Visits 20    Date for PT Re-Evaluation 12/13/22    Authorization Type Medicare    Authorization Time Period 10/04/22-12/13/22    Authorization - Visit Number 4    Authorization - Number of Visits 20    Progress Note Due on Visit 10    PT Start Time 1105    PT Stop Time 3235    PT Time Calculation (min) 40 min    Activity Tolerance Patient tolerated treatment well    Behavior During Therapy [facility] for tasks assessed/performed             Past Medical History:  Diagnosis Date   Anemia    iron deficient   Arthritis    Autoimmune hepatitis ([place]) 05/10/2016   Hep C   B12 deficiency    Barrett esophagus 01/2016   Bradycardia    Chronic kidney disease    stage III   Diverticulosis    GERD (gastroesophageal reflux disease)    Hypertension    Hypothyroidism    Ulcer disease    Past Surgical History:  Procedure Laterality Date   CATARACT EXTRACTION W/PHACO Left 08/30/2021   Procedure: CATARACT EXTRACTION PHACO AND INTRAOCULAR LENS PLACEMENT ([place]) LEFT 8.21 01:00.3;  Surgeon: Leandrew Koyanagi, MD;  Location: [place];  Service: Ophthalmology;  Laterality: Left;   CATARACT EXTRACTION W/PHACO Right 09/13/2021   Procedure: CATARACT EXTRACTION PHACO AND INTRAOCULAR LENS PLACEMENT ([place]) RIGHT;  Surgeon: Leandrew Koyanagi, MD;  Location: [place];  Service: Ophthalmology;  Laterality: Right;  8.11 00:59.8   CHOLECYSTECTOMY N/A 06/25/2018   Procedure: LAPAROSCOPIC CHOLECYSTECTOMY WITH INTRAOPERATIVE CHOLANGIOGRAM;  Surgeon: Robert Bellow, MD;  Location: [facility];  Service: General;  Laterality: N/A;   ESOPHAGOGASTRODUODENOSCOPY (EGD) WITH PROPOFOL  N/A 01/30/2016   Procedure: ESOPHAGOGASTRODUODENOSCOPY (EGD) WITH PROPOFOL;  Surgeon: Lollie Sails, MD;  Location: [facility];  Service: Endoscopy;  Laterality: N/A;   ESOPHAGOGASTRODUODENOSCOPY (EGD) WITH PROPOFOL N/A 01/14/2018   Procedure: ESOPHAGOGASTRODUODENOSCOPY (EGD) WITH PROPOFOL;  Surgeon: Lollie Sails, MD;  Location: [facility]COPY;  Service: Endoscopy;  Laterality: N/A;   ESOPHAGOGASTRODUODENOSCOPY (EGD) WITH PROPOFOL N/A 05/20/2018   Procedure: ESOPHAGOGASTRODUODENOSCOPY (EGD) WITH PROPOFOL;  Surgeon: Lollie Sails, MD;  Location: [facility] ENDOSCOPY;  Service: Endoscopy;  Laterality: N/A;   ESOPHAGOGASTRODUODENOSCOPY (EGD) WITH PROPOFOL N/A 01/13/2019   Procedure: ESOPHAGOGASTRODUODENOSCOPY (EGD) WITH PROPOFOL;  Surgeon: Lollie Sails, MD;  Location: [facility] ENDOSCOPY;  Service: Endoscopy;  Laterality: N/A;   HERNIA REPAIR     lap hiatal hernia repair  07/08/08   LEFT OOPHORECTOMY  1993   benign ovarian cyst   TUBAL LIGATION  1981   Patient Active Problem List   Diagnosis Date Noted   Bladder prolapse, female, acquired 05/06/2022   Hematoma 10/22/2021   Right knee pain 10/13/2021   SVT (supraventricular tachycardia) 10/07/2021   PSVT (paroxysmal supraventricular tachycardia) 10/06/2021   Numbness in feet 06/18/2021   CKD (chronic kidney disease) 06/12/2021   Dizziness 04/10/2021   Chest tightness 02/07/2021   Diarrhea 10/09/2020   Abdominal discomfort 09/27/2019   Gall bladder polyp 05/07/2018   Breast tenderness 05/07/2018   Hyperglycemia 09/10/2016   Fullness of breast 09/10/2016  Autoimmune hepatitis ([place]) 05/10/2016   MGUS (monoclonal gammopathy of unknown significance) 06/06/2015   LUQ fullness 06/06/2015   Health care maintenance 06/06/2015   Fatty tumor 03/07/2015   BMI 32.0-32.9,adult 01/02/2015   Abnormal liver function tests 01/02/2015   Hand cramps 11/15/2014   Leg cramps 06/13/2014   Osteopenia 03/02/2013   Hyperbilirubinemia 03/02/2013    Hypertension 12/22/2012   Gastroparesis 12/22/2012   Barrett's esophagus with esophagitis 12/22/2012   Hypothyroidism 12/22/2012   Anemia 12/22/2012   B12 deficiency 12/22/2012   CKD (chronic kidney disease) stage 3, GFR 30-59 ml/min (HCC) 12/22/2012    REFERRING DIAG: M25.561 (ICD-10-CM) - Right knee pain, unspecified chronicity  THERAPY DIAG:  Muscle weakness (generalized)  Difficulty in walking, not elsewhere classified  Rationale for Evaluation and Treatment Rehabilitation  PERTINENT HISTORY: Pt was initially sent her by PCP for right knee pain from a patella fracture that she sustained back in February.  She occasionally feels pain in her left knee, but she does not identify this as a problem. Pt describes feeling like she is walking stiff where she is having to lift the right leg upstairs.  She feels afraid of falling and she walks slower as a result. She does not normally use a SPC with the exception of shopping in a grocery store.   PRECAUTIONS: Falls   SUBJECTIVE: Pt states that exercises have been going well but she is continuing to have difficulty performing dorsiflexion with standing balance exercises.   PAIN:  Are you having pain? No   OBJECTIVE:                 BP 180/54 HR 58 SpO2 100      DIAGNOSTIC FINDINGS: CLINICAL DATA:  Right knee pain, fall   EXAM: RIGHT KNEE - 1-2 VIEW   COMPARISON:  None.   FINDINGS: Advanced tricompartment degenerative changes with joint space narrowing and spurring. Moderate joint effusion. No acute bony abnormality. Specifically, no fracture, subluxation, or dislocation.   IMPRESSION: No acute bony abnormality.  Degenerative changes and joint effusion.     Electronically Signed   By: Rolm Baptise M.D.   On: 10/16/2021 20:51   PATIENT SURVEYS:  FOTO 45/100 with target of 52    COGNITION:           Overall cognitive status: Within functional limits for tasks assessed                          SENSATION: WFL        MUSCLE LENGTH: Hamstrings: Right 60 deg; Left 60 deg     POSTURE: rounded shoulders and forward head   PALPATION: TTP of anterior surface of bilateral knees    LOWER EXTREMITY ROM:     Active /PROM Right 10/04/2022 Left 10/04/2022  Hip flexion 120 120  Hip extension 30 30  Hip abduction 45 45  Hip adduction 30 30  Hip internal rotation 45 45  Hip external rotation 45 45  Knee flexion 110/120 110/120  Knee extension 0 0  Ankle dorsiflexion      Ankle plantarflexion      Ankle inversion      Ankle eversion       (Blank rows = not tested)        LOWER EXTREMITY MMT:   MMT Right eval Left eval  Hip flexion 4 4  Hip extension 4- 4-  Hip abduction 4- 4-  Hip adduction 3+ 3+  Hip internal  rotation      Hip external rotation      Knee flexion 4 4  Knee extension 4 4  Ankle dorsiflexion      Ankle plantarflexion      Ankle inversion      Ankle eversion       (Blank rows = not tested)   LOWER EXTREMITY SPECIAL TESTS:  Hip special tests: Marcello Moores test: negative and Ely's test: positive    FUNCTIONAL TESTS:  5 times sit to stand: 19 sec  30 seconds chair stand test: 9 reps  Dynamic Gait Index: NT    GAIT: Distance walked: 10 meters  Assistive device utilized: None Level of assistance: Complete Independence Comments: Decreased step length on RLE        TODAY'S TREATMENT:  10/16/22:  THEREX             TM with BUE support for 5 min at 1.3 mph            Standing heel and toe raises 1 x 10             -Pt struggles to dorsiflex left foot while in standing              Standing heel raises with #8 DB 3 x 10             Standing dorsiflexion with 1 UE support 3 x 10  NMR             25 ft walk with horizontal head turns x 6              25 ft walk with vertical head turns x 6              25 ft walk with vertical and horizontal head turns x 6               25 ft walk step over 6 inc obstacles x 4                -Decreased hip flexion on  RLE  Semi-tandem with horizontal head turns 2 x 10             Semi-tandem with vertical head turns 2 x 10             Modified single leg stance on cone 2 x 30 sec              -moderate sway with hip strategy   10/11/22:              TM with BUE support for 5 min at 1.0 mph              Standing Right groin stretch 3 x 30 sec               -mod VC to sequence exercise               Standing Heel Raises with BUE support 1 x 10               Standing Heel Raises with 1 UE support 1 x 10               Standing Heel Raises off of 1 foot step 2 x 10                              Single leg heel raises with BUE support  2 x 10                 Sit to stand with jug of water from 20 inch mat height 1 x 10                Sit to stand with two #8 DBs 1 x 10                                Standing Marches with BUE support 2 x 10                 Standing Marches with 1 UE support 2 x 10                 Standing Marches with no UE support 2 x 10                10/09/22:              Nu-Step Level 6 for arms and feet and resistance of 2 for 5 min             10 Meter Walk Test   Gait Speed:   Avg=  20 sec - Normal Gait speed Avg= 0.50   m/sec   Fast Gait Speed: Avg=  16 sec -Fast Gait speed time= 0.63 m/sec   -> 1 m/sec [employer] and cross street safely and normal WS <1 m/sec Need Intervention to reduce falls risk  0.12 m/sec improvement represents a statistically significant improvement in gait speed     - <0.6 m/sec associated with severe impairment; likely that pt is capable only of limited community or household ambulation; increased likelihood of dependence with ADLs.                           DGI: 15/24                 Mild Impairment: Gait on level surface, Change in gait speed, horizontal and vertical head turns, gait and pivot turns                Moderate Impairment: Obstacles, Stairs               Sit to Stand with 8 lb jug of water from 20 inch mat height 3 x 8  reps              Standing Hip Abduction    Initial  Prone Quad Stretch 3 x 30 sec        PATIENT EDUCATION:  Education details: form and education for appropriate exercise and explanation of tests and measures  Person educated: Patient Education method: Consulting civil engineer, Demonstration, Verbal cues, and Handouts Education comprehension: verbalized understanding, returned demonstration, and verbal cues required     HOME EXERCISE PROGRAM: Access Code: [email or web address] URL: [email or web address] Date: 10/16/2022 Prepared by: Bradly Chris  Exercises - Sit to Stand  - 1 x daily - 3 x weekly - 3 sets - 10 reps - Prone Quadriceps Stretch with Strap  - 1 x daily - 3 reps - 30-60 sec  hold - Standing Hip Abduction with Counter Support  - 3 x weekly - 3 sets - 10 reps - Standing March  - 3 x weekly - 3 sets - 10 reps - Single Leg Heel Raise with Counter Support  - 3 x weekly - 3 sets -  10 reps - Side Lunge Adductor Stretch  - 1 x daily - 3 reps - 30 hold - Toe Raises with Counter Support  - 3 x weekly - 3 sets - 10 reps - Half Tandem Stance Balance with Eyes Closed  - 1 x daily - 5 reps - 30 sec  hold  ASSESSMENT:   CLINICAL IMPRESSION:  Pt shows improved dynamic balance with ability to perform head turns and step over obstacles while walking. She continues to show decreased hip flexion on right hip along with ER of right foot for foot clearance and decreased dorsiflexion on left foot with standing toe raise exercise. HEP modified to include exercises to strengthen hip flexors and dorsiflexors to improve right foot clearance to avoid tripping especially when stepping over obstacles and to increase step length. Pt also shows difficulty maintain single leg stance especially on RLE with increased sway with modified SLS exercise. She will continue to benefit from skilled PT to increase her LE strength and endurance and improve her dynamic balance to decrease risk of falls to continue to  remain an independent community dwelling adult.    OBJECTIVE IMPAIRMENTS Abnormal gait, decreased balance, decreased ROM, and decreased strength.    ACTIVITY LIMITATIONS squatting, stairs, and locomotion level   PARTICIPATION LIMITATIONS: shopping, community activity, and yard work   [place] Age and 3+ comorbidities: chronic kidney disease, HTN, and h/o right knee patella fracture   are also affecting patient's functional outcome.    REHAB POTENTIAL: Good   CLINICAL DECISION MAKING: Stable/uncomplicated   EVALUATION COMPLEXITY: Low     GOALS: Goals reviewed with patient? No   SHORT TERM GOALS: Target date: 10/18/2022  Pt will be independent with HEP in order to improve strength and balance in order to decrease fall risk and improve function at home and work. Baseline: NT  Goal status: Ongoing    2.  Pt will decrease 5TSTS by at least 3 seconds in order to demonstrate clinically significant improvement in LE strength.  Baseline: 9 sec  Goal status: Ongoing    3.  Pt will increase 10MWT by at least 0.13 m/s in order to demonstrate clinically significant improvement in community ambulation. Baseline: 0.50 m/sec  Goal status: Ongoing        LONG TERM GOALS: Target date: 12/13/2022    Patient will have improved function and activity level as evidenced by an increase in FOTO score by 10 points or more. Baseline: 43/100 with target of 52  Goal status: Ongoing    2.  Patient will improve hip and knee strength by 1/3 MMT (I.e 4- to 4) for improved hip stability to improve steadiness of gait to decrease risk of falling.  Baseline: Hip Adduction R/L 3+/3+, Hip Abduction R/L 4-/4-, Hip Ext R/L  4-/-, Knee Flex R/L 4/4, Knee Ext R/L4/4 Goal status: Ongoing    3.  Patient will demonstrate reduced falls risk as evidenced by Dynamic Gait Index (DGI) >19/24. Baseline:15/24 Goal status: Ongoing    4.  Patient will improve 5 x STS to 11 sec to demonstrate improved LE strength  that places her at a decreased risk for falls. Baseline: 19 sec  Goal status: Ongoing    5.  Patient will demonstrate a gait speed < 1.0 m/sec to show that she capable of a gait speed that places her at a decreased risk for falls.  Baseline: 0.50 m/sec  Goal status: Ongoing    6.  Patient will perform 8>= sit to stands within  30 sec to show LE endurance that places her at a decreased risk for falls.  Baseline: 9 reps  Goal status: Ongoing      PLAN: PT FREQUENCY: 1-2x/week   PT DURATION: 10 weeks   PLANNED INTERVENTIONS: Therapeutic exercises, Neuromuscular re-education, Balance training, Gait training, Joint mobilization, Joint manipulation, Stair training, DME instructions, Aquatic Therapy, Dry Needling, Spinal manipulation, Spinal mobilization, Cryotherapy, Moist heat, Manual therapy, and Re-evaluation   PLAN FOR NEXT SESSION: Continue to progress LE strengthening exercises with OTAGO exercises and begin dynamic balance exercises      Bradly Chris PT, DPT  10/16/2022, 11:11 AM

## 2022-10-18 ENCOUNTER — Encounter: Payer: Self-pay | Admitting: Physical Therapy

## 2022-10-18 ENCOUNTER — Ambulatory Visit: Payer: Medicare Other

## 2022-10-18 DIAGNOSIS — R262 Difficulty in walking, not elsewhere classified: Secondary | ICD-10-CM | POA: Diagnosis not present

## 2022-10-18 DIAGNOSIS — M6281 Muscle weakness (generalized): Secondary | ICD-10-CM

## 2022-10-18 DIAGNOSIS — M25561 Pain in right knee: Secondary | ICD-10-CM | POA: Diagnosis not present

## 2022-10-18 NOTE — Therapy (Signed)
OUTPATIENT PHYSICAL THERAPY TREATMENT NOTE   Patient Name: Lori [name] MRN: [id number] DOB:[birth year], 88 y.o., female Today's Date: 10/18/2022  PCP: Dr. Einar Pheasant  REFERRING PROVIDER: Dr. Einar Pheasant   END OF SESSION:   PT End of Session - 10/18/22 1537     Visit Number 5    Number of Visits 20    Date for PT Re-Evaluation 12/13/22    Authorization Type Medicare    Authorization Time Period 10/04/22-12/13/22    Authorization - Visit Number 4    Authorization - Number of Visits 20    Progress Note Due on Visit 10    PT Start Time 1540    PT Stop Time 1626    PT Time Calculation (min) 46 min    Activity Tolerance Patient tolerated treatment well    Behavior During Therapy [facility] for tasks assessed/performed             Past Medical History:  Diagnosis Date   Anemia    iron deficient   Arthritis    Autoimmune hepatitis ([place]) 05/10/2016   Hep C   B12 deficiency    Barrett esophagus 01/2016   Bradycardia    Chronic kidney disease    stage III   Diverticulosis    GERD (gastroesophageal reflux disease)    Hypertension    Hypothyroidism    Ulcer disease    Past Surgical History:  Procedure Laterality Date   CATARACT EXTRACTION W/PHACO Left 08/30/2021   Procedure: CATARACT EXTRACTION PHACO AND INTRAOCULAR LENS PLACEMENT ([place]) LEFT 8.21 01:00.3;  Surgeon: Leandrew Koyanagi, MD;  Location: [place];  Service: Ophthalmology;  Laterality: Left;   CATARACT EXTRACTION W/PHACO Right 09/13/2021   Procedure: CATARACT EXTRACTION PHACO AND INTRAOCULAR LENS PLACEMENT ([place]) RIGHT;  Surgeon: Leandrew Koyanagi, MD;  Location: [place];  Service: Ophthalmology;  Laterality: Right;  8.11 00:59.8   CHOLECYSTECTOMY N/A 06/25/2018   Procedure: LAPAROSCOPIC CHOLECYSTECTOMY WITH INTRAOPERATIVE CHOLANGIOGRAM;  Surgeon: Robert Bellow, MD;  Location: [facility];  Service: General;  Laterality: N/A;   ESOPHAGOGASTRODUODENOSCOPY (EGD) WITH PROPOFOL  N/A 01/30/2016   Procedure: ESOPHAGOGASTRODUODENOSCOPY (EGD) WITH PROPOFOL;  Surgeon: Lollie Sails, MD;  Location: [facility] ENDOSCOPY;  Service: Endoscopy;  Laterality: N/A;   ESOPHAGOGASTRODUODENOSCOPY (EGD) WITH PROPOFOL N/A 01/14/2018   Procedure: ESOPHAGOGASTRODUODENOSCOPY (EGD) WITH PROPOFOL;  Surgeon: Lollie Sails, MD;  Location: [facility]COPY;  Service: Endoscopy;  Laterality: N/A;   ESOPHAGOGASTRODUODENOSCOPY (EGD) WITH PROPOFOL N/A 05/20/2018   Procedure: ESOPHAGOGASTRODUODENOSCOPY (EGD) WITH PROPOFOL;  Surgeon: Lollie Sails, MD;  Location: [facility] ENDOSCOPY;  Service: Endoscopy;  Laterality: N/A;   ESOPHAGOGASTRODUODENOSCOPY (EGD) WITH PROPOFOL N/A 01/13/2019   Procedure: ESOPHAGOGASTRODUODENOSCOPY (EGD) WITH PROPOFOL;  Surgeon: Lollie Sails, MD;  Location: [facility] ENDOSCOPY;  Service: Endoscopy;  Laterality: N/A;   HERNIA REPAIR     lap hiatal hernia repair  07/08/08   LEFT OOPHORECTOMY  1993   benign ovarian cyst   TUBAL LIGATION  1981   Patient Active Problem List   Diagnosis Date Noted   Bladder prolapse, female, acquired 05/06/2022   Hematoma 10/22/2021   Right knee pain 10/13/2021   SVT (supraventricular tachycardia) 10/07/2021   PSVT (paroxysmal supraventricular tachycardia) 10/06/2021   Numbness in feet 06/18/2021   CKD (chronic kidney disease) 06/12/2021   Dizziness 04/10/2021   Chest tightness 02/07/2021   Diarrhea 10/09/2020   Abdominal discomfort 09/27/2019   Gall bladder polyp 05/07/2018   Breast tenderness 05/07/2018   Hyperglycemia 09/10/2016   Fullness of breast 09/10/2016  Autoimmune hepatitis ([place]) 05/10/2016   MGUS (monoclonal gammopathy of unknown significance) 06/06/2015   LUQ fullness 06/06/2015   Health care maintenance 06/06/2015   Fatty tumor 03/07/2015   BMI 32.0-32.9,adult 01/02/2015   Abnormal liver function tests 01/02/2015   Hand cramps 11/15/2014   Leg cramps 06/13/2014   Osteopenia 03/02/2013   Hyperbilirubinemia 03/02/2013    Hypertension 12/22/2012   Gastroparesis 12/22/2012   Barrett's esophagus with esophagitis 12/22/2012   Hypothyroidism 12/22/2012   Anemia 12/22/2012   B12 deficiency 12/22/2012   CKD (chronic kidney disease) stage 3, GFR 30-59 ml/min (HCC) 12/22/2012    REFERRING DIAG: M25.561 (ICD-10-CM) - Right knee pain, unspecified chronicity  THERAPY DIAG:  Muscle weakness (generalized)  Difficulty in walking, not elsewhere classified  Rationale for Evaluation and Treatment Rehabilitation  PERTINENT HISTORY: Pt was initially sent her by PCP for right knee pain from a patella fracture that she sustained back in February.  She occasionally feels pain in her left knee, but she does not identify this as a problem. Pt describes feeling like she is walking stiff where she is having to lift the right leg upstairs.  She feels afraid of falling and she walks slower as a result. She does not normally use a SPC with the exception of shopping in a grocery store.   PRECAUTIONS: Falls   SUBJECTIVE: Pt denies pain or falls. Still having difficulty with her balance. Has been compliant with her HEP.   PAIN:  Are you having pain? No   OBJECTIVE:                 BP 180/54 HR 58 SpO2 100      DIAGNOSTIC FINDINGS: CLINICAL DATA:  Right knee pain, fall   EXAM: RIGHT KNEE - 1-2 VIEW   COMPARISON:  None.   FINDINGS: Advanced tricompartment degenerative changes with joint space narrowing and spurring. Moderate joint effusion. No acute bony abnormality. Specifically, no fracture, subluxation, or dislocation.   IMPRESSION: No acute bony abnormality.  Degenerative changes and joint effusion.     Electronically Signed   By: Rolm Baptise M.D.   On: 10/16/2021 20:51   PATIENT SURVEYS:  FOTO 45/100 with target of 52    COGNITION:           Overall cognitive status: Within functional limits for tasks assessed                          SENSATION: WFL       MUSCLE LENGTH: Hamstrings: Right 60  deg; Left 60 deg     POSTURE: rounded shoulders and forward head   PALPATION: TTP of anterior surface of bilateral knees    LOWER EXTREMITY ROM:     Active /PROM Right 10/04/2022 Left 10/04/2022  Hip flexion 120 120  Hip extension 30 30  Hip abduction 45 45  Hip adduction 30 30  Hip internal rotation 45 45  Hip external rotation 45 45  Knee flexion 110/120 110/120  Knee extension 0 0  Ankle dorsiflexion      Ankle plantarflexion      Ankle inversion      Ankle eversion       (Blank rows = not tested)        LOWER EXTREMITY MMT:   MMT Right eval Left eval  Hip flexion 4 4  Hip extension 4- 4-  Hip abduction 4- 4-  Hip adduction 3+ 3+  Hip internal rotation  Hip external rotation      Knee flexion 4 4  Knee extension 4 4  Ankle dorsiflexion      Ankle plantarflexion      Ankle inversion      Ankle eversion       (Blank rows = not tested)   LOWER EXTREMITY SPECIAL TESTS:  Hip special tests: Marcello Moores test: negative and Ely's test: positive    FUNCTIONAL TESTS:  5 times sit to stand: 19 sec  30 seconds chair stand test: 9 reps  Dynamic Gait Index: NT    GAIT: Distance walked: 10 meters  Assistive device utilized: None Level of assistance: Complete Independence Comments: Decreased step length on RLE        TODAY'S TREATMENT:  There.ex: TM for warm up with BUE support. 5 min at 1.3 MPH. Discussion of HEP, balance deficits during warm up.  Standing Heel to toe raises: 3x12  SUE support standing ankle DF/LE: 3x12    Neuro Re-ed: CGA and gait belt utilized Gait along 25' pathway: X6 laps   Figure 8's for R/L turns: x6 laps   Obstacle navigation -->  3x 6" hurdles -->  ambulating over unstable surface: x6 laps. Appears difficulty with stance phase on RLE leading to decreased LLE foot clearance. Min ot mod VC's for hip flexion instead of compensatory circumduction.   SLS with contralateral LE resting on cone for support: 2x30 sec, two finger  support   Ambulating 100' circles in gym: 3 laps working on changes in gait speed and horizontal and vertical head turns. Given in random order to work on reaction time, dynamic balance.  Additional 3 laps adding cognitive dual task along with tasks above (naming kitchen utensils and jewelry stones). CGA. Some variable step lengths and slowed gait speed noted but no significant LOB.   Tandem stance no UE support: 2x30 sec/LE under BOS   Normal stance on foam: 1 minutes   Narrow stance on foam: 2x30 sec    PATIENT EDUCATION:  Education details: form and education for appropriate exercise and explanation of tests and measures  Person educated: Patient Education method: Consulting civil engineer, Demonstration, Verbal cues, and Handouts Education comprehension: verbalized understanding, returned demonstration, and verbal cues required     HOME EXERCISE PROGRAM: Access Code: [email or web address] URL: [email or web address] Date: 10/16/2022 Prepared by: Bradly Chris  Exercises - Sit to Stand  - 1 x daily - 3 x weekly - 3 sets - 10 reps - Prone Quadriceps Stretch with Strap  - 1 x daily - 3 reps - 30-60 sec  hold - Standing Hip Abduction with Counter Support  - 3 x weekly - 3 sets - 10 reps - Standing March  - 3 x weekly - 3 sets - 10 reps - Single Leg Heel Raise with Counter Support  - 3 x weekly - 3 sets - 10 reps - Side Lunge Adductor Stretch  - 1 x daily - 3 reps - 30 hold - Toe Raises with Counter Support  - 3 x weekly - 3 sets - 10 reps - Half Tandem Stance Balance with Eyes Closed  - 1 x daily - 5 reps - 30 sec  hold  ASSESSMENT:   CLINICAL IMPRESSION: Continuing PT POC with focus on dynamic balance. Implementation of foam surfaces and cognitive dual tasking today to challenge dynamic balance and vestibular input. With cognitive tasks pt demonstrates variable step lengths and slowed gait speed but no LOB but does have hard time with cognitive dual task with slowed processing.  With foam pt  demonstrates increased ankle strategy similar to tandem stance to maintain balance. Pt will continue to benefit from skilled PT services to progress balance deficits to address remaining deficits.     OBJECTIVE IMPAIRMENTS Abnormal gait, decreased balance, decreased ROM, and decreased strength.    ACTIVITY LIMITATIONS squatting, stairs, and locomotion level   PARTICIPATION LIMITATIONS: shopping, community activity, and yard work   [place] Age and 3+ comorbidities: chronic kidney disease, HTN, and h/o right knee patella fracture   are also affecting patient's functional outcome.    REHAB POTENTIAL: Good   CLINICAL DECISION MAKING: Stable/uncomplicated   EVALUATION COMPLEXITY: Low     GOALS: Goals reviewed with patient? No   SHORT TERM GOALS: Target date: 10/18/2022  Pt will be independent with HEP in order to improve strength and balance in order to decrease fall risk and improve function at home and work. Baseline: NT  Goal status: Ongoing    2.  Pt will decrease 5TSTS by at least 3 seconds in order to demonstrate clinically significant improvement in LE strength.  Baseline: 9 sec  Goal status: Ongoing    3.  Pt will increase 10MWT by at least 0.13 m/s in order to demonstrate clinically significant improvement in community ambulation. Baseline: 0.50 m/sec  Goal status: Ongoing        LONG TERM GOALS: Target date: 12/13/2022    Patient will have improved function and activity level as evidenced by an increase in FOTO score by 10 points or more. Baseline: 43/100 with target of 52  Goal status: Ongoing    2.  Patient will improve hip and knee strength by 1/3 MMT (I.e 4- to 4) for improved hip stability to improve steadiness of gait to decrease risk of falling.  Baseline: Hip Adduction R/L 3+/3+, Hip Abduction R/L 4-/4-, Hip Ext R/L  4-/-, Knee Flex R/L 4/4, Knee Ext R/L4/4 Goal status: Ongoing    3.  Patient will demonstrate reduced falls risk as evidenced by  Dynamic Gait Index (DGI) >19/24. Baseline:15/24 Goal status: Ongoing    4.  Patient will improve 5 x STS to 11 sec to demonstrate improved LE strength that places her at a decreased risk for falls. Baseline: 19 sec  Goal status: Ongoing    5.  Patient will demonstrate a gait speed < 1.0 m/sec to show that she capable of a gait speed that places her at a decreased risk for falls.  Baseline: 0.50 m/sec  Goal status: Ongoing    6.  Patient will perform 8>= sit to stands within 30 sec to show LE endurance that places her at a decreased risk for falls.  Baseline: 9 reps  Goal status: Ongoing      PLAN: PT FREQUENCY: 1-2x/week   PT DURATION: 10 weeks   PLANNED INTERVENTIONS: Therapeutic exercises, Neuromuscular re-education, Balance training, Gait training, Joint mobilization, Joint manipulation, Stair training, DME instructions, Aquatic Therapy, Dry Needling, Spinal manipulation, Spinal mobilization, Cryotherapy, Moist heat, Manual therapy, and Re-evaluation   PLAN FOR NEXT SESSION: Continue to progress LE strengthening exercises with OTAGO exercises and begin dynamic balance exercises     Salem Caster. Fairly IV, PT, DPT Physical Therapist- [facility] 10/18/2022, 5:21 PM

## 2022-10-23 ENCOUNTER — Ambulatory Visit: Payer: Medicare Other | Admitting: Physical Therapy

## 2022-10-23 ENCOUNTER — Encounter: Payer: Self-pay | Admitting: Physical Therapy

## 2022-10-23 DIAGNOSIS — R262 Difficulty in walking, not elsewhere classified: Secondary | ICD-10-CM

## 2022-10-23 DIAGNOSIS — M6281 Muscle weakness (generalized): Secondary | ICD-10-CM

## 2022-10-23 DIAGNOSIS — M25561 Pain in right knee: Secondary | ICD-10-CM | POA: Diagnosis not present

## 2022-10-23 NOTE — Therapy (Addendum)
OUTPATIENT PHYSICAL THERAPY TREATMENT NOTE   Patient Name: Lori [name] MRN: [id number] DOB:December 01, 1934, 88 y.o., female Today's Date: 10/23/2022  PCP: Dr. Dale Gillespie  REFERRING PROVIDER: Dr. Dale [place]   END OF SESSION:   PT End of Session - 10/23/22 1222     Visit Number 6    Number of Visits 20    Date for PT Re-Evaluation 12/13/22    Authorization Type Medicare    Authorization Time Period 10/04/22-12/13/22    Authorization - Visit Number 6    Authorization - Number of Visits 20    Progress Note Due on Visit 10    PT Start Time 1145    PT Stop Time 1230    PT Time Calculation (min) 45 min    Activity Tolerance Patient tolerated treatment well    Behavior During Therapy [facility] for tasks assessed/performed              Past Medical History:  Diagnosis Date   Anemia    iron deficient   Arthritis    Autoimmune hepatitis (HCC) 05/10/2016   Hep C   B12 deficiency    Barrett esophagus 01/2016   Bradycardia    Chronic kidney disease    stage III   Diverticulosis    GERD (gastroesophageal reflux disease)    Hypertension    Hypothyroidism    Ulcer disease    Past Surgical History:  Procedure Laterality Date   CATARACT EXTRACTION W/PHACO Left 08/30/2021   Procedure: CATARACT EXTRACTION PHACO AND INTRAOCULAR LENS PLACEMENT (IOC) LEFT 8.21 01:00.3;  Surgeon: Lockie Mola, MD;  Location: [facility];  Service: Ophthalmology;  Laterality: Left;   CATARACT EXTRACTION W/PHACO Right 09/13/2021   Procedure: CATARACT EXTRACTION PHACO AND INTRAOCULAR LENS PLACEMENT (IOC) RIGHT;  Surgeon: Lockie Mola, MD;  Location: [facility];  Service: Ophthalmology;  Laterality: Right;  8.11 00:59.8   CHOLECYSTECTOMY N/A 06/25/2018   Procedure: LAPAROSCOPIC CHOLECYSTECTOMY WITH INTRAOPERATIVE CHOLANGIOGRAM;  Surgeon: Earline Mayotte, MD;  Location: [facility];  Service: General;  Laterality: N/A;   ESOPHAGOGASTRODUODENOSCOPY (EGD) WITH  PROPOFOL N/A 01/30/2016   Procedure: ESOPHAGOGASTRODUODENOSCOPY (EGD) WITH PROPOFOL;  Surgeon: Christena Deem, MD;  Location: [facility];  Service: Endoscopy;  Laterality: N/A;   ESOPHAGOGASTRODUODENOSCOPY (EGD) WITH PROPOFOL N/A 01/14/2018   Procedure: ESOPHAGOGASTRODUODENOSCOPY (EGD) WITH PROPOFOL;  Surgeon: Christena Deem, MD;  Location: [facility] END[facility];  Service: Endoscopy;  Laterality: N/A;   ESOPHAGOGASTRODUODENOSCOPY (EGD) WITH PROPOFOL N/A 05/20/2018   Procedure: ESOPHAGOGASTRODUODENOSCOPY (EGD) WITH PROPOFOL;  Surgeon: Christena Deem, MD;  Location: [facility];  Service: Endoscopy;  Laterality: N/A;   ESOPHAGOGASTRODUODENOSCOPY (EGD) WITH PROPOFOL N/A 01/13/2019   Procedure: ESOPHAGOGASTRODUODENOSCOPY (EGD) WITH PROPOFOL;  Surgeon: Christena Deem, MD;  Location: [facility] END[facility];  Service: Endoscopy;  Laterality: N/A;   HERNIA REPAIR     lap hiatal hernia repair  07/08/08   LEFT OOPHORECTOMY  1993   benign ovarian cyst   TUBAL LIGATION  1981   Patient Active Problem List   Diagnosis Date Noted   Bladder prolapse, female, acquired 05/06/2022   Hematoma 10/22/2021   Right knee pain 10/13/2021   SVT (supraventricular tachycardia) 10/07/2021   PSVT (paroxysmal supraventricular tachycardia) 10/06/2021   Numbness in feet 06/18/2021   CKD (chronic kidney disease) 06/12/2021   Dizziness 04/10/2021   Chest tightness 02/07/2021   Diarrhea 10/09/2020   Abdominal discomfort 09/27/2019   Gall bladder polyp 05/07/2018   Breast tenderness 05/07/2018   Hyperglycemia 09/10/2016   Fullness of breast 09/10/2016  Autoimmune hepatitis (HCC) 05/10/2016   MGUS (monoclonal gammopathy of unknown significance) 06/06/2015   LUQ fullness 06/06/2015   Health care maintenance 06/06/2015   Fatty tumor 03/07/2015   BMI 32.0-32.9,adult 01/02/2015   Abnormal liver function tests 01/02/2015   Hand cramps 11/15/2014   Leg cramps 06/13/2014   Osteopenia 03/02/2013   Hyperbilirubinemia  03/02/2013   Hypertension 12/22/2012   Gastroparesis 12/22/2012   Barrett's esophagus with esophagitis 12/22/2012   Hypothyroidism 12/22/2012   Anemia 12/22/2012   B12 deficiency 12/22/2012   CKD (chronic kidney disease) stage 3, GFR 30-59 ml/min (HCC) 12/22/2012    REFERRING DIAG: M25.561 (ICD-10-CM) - Right knee pain, unspecified chronicity  THERAPY DIAG:  Muscle weakness (generalized)  Difficulty in walking, not elsewhere classified  Rationale for Evaluation and Treatment Rehabilitation  PERTINENT HISTORY: Pt was initially sent her by PCP for right knee pain from a patella fracture that she sustained back in February.  She occasionally feels pain in her left knee, but she does not identify this as a problem. Pt describes feeling like she is walking stiff where she is having to lift the right leg upstairs.  She feels afraid of falling and she walks slower as a result. She does not normally use a SPC with the exception of shopping in a grocery store.   PRECAUTIONS: Falls   SUBJECTIVE: Pt reports increased stiffness in the back of her left knee especially when going up and down stairs.   PAIN:  Are you having pain? No   OBJECTIVE:                 BP 180/54 HR 58 SpO2 100      DIAGNOSTIC FINDINGS: CLINICAL DATA:  Right knee pain, fall   EXAM: RIGHT KNEE - 1-2 VIEW   COMPARISON:  None.   FINDINGS: Advanced tricompartment degenerative changes with joint space narrowing and spurring. Moderate joint effusion. No acute bony abnormality. Specifically, no fracture, subluxation, or dislocation.   IMPRESSION: No acute bony abnormality.  Degenerative changes and joint effusion.     Electronically Signed   By: Charlett Nose M.D.   On: 10/16/2021 20:51   PATIENT SURVEYS:  FOTO 45/100 with target of 52    COGNITION:           Overall cognitive status: Within functional limits for tasks assessed                          SENSATION: WFL       MUSCLE LENGTH: Hamstrings:  Right 60 deg; Left 60 deg     POSTURE: rounded shoulders and forward head   PALPATION: TTP of anterior surface of bilateral knees    LOWER EXTREMITY ROM:     Active /PROM Right 10/04/2022 Left 10/04/2022  Hip flexion 120 120  Hip extension 30 30  Hip abduction 45 45  Hip adduction 30 30  Hip internal rotation 45 45  Hip external rotation 45 45  Knee flexion 110/120 110/120  Knee extension 0 0  Ankle dorsiflexion      Ankle plantarflexion      Ankle inversion      Ankle eversion       (Blank rows = not tested)        LOWER EXTREMITY MMT:   MMT Right eval Left eval  Hip flexion 4 4  Hip extension 4- 4-  Hip abduction 4- 4-  Hip adduction 3+ 3+  Hip internal rotation  Hip external rotation      Knee flexion 4 4  Knee extension 4 4  Ankle dorsiflexion      Ankle plantarflexion      Ankle inversion      Ankle eversion       (Blank rows = not tested)   LOWER EXTREMITY SPECIAL TESTS:  Hip special tests: Maisie Fus test: negative and Ely's test: positive    FUNCTIONAL TESTS:  5 times sit to stand: 19 sec  30 seconds chair stand test: 9 reps  Dynamic Gait Index: NT    GAIT: Distance walked: 10 meters  Assistive device utilized: None Level of assistance: Complete Independence Comments: Decreased step length on RLE        TODAY'S TREATMENT:  10/23/22  NMR  Modified SLS on yoga block 4 x 30 sec   THEREX   Seated HS Stretch 2 x 30 sec  FOTO: 49/100 with target of 52 Sit to Stand from 18 inch mat height - 1 x 10  Sit to Stand from 18 inch mat height with #8 DB- 1 x 5  Sit to Stand from 18 inch mat height with #5 DB- 1 x 5            Standing HS Curls 3 x 10    10/18/22   There.ex: TM for warm up with BUE support. 5 min at 1.3 MPH. Discussion of HEP, balance deficits during warm up.  Standing Heel to toe raises: 3x12  SUE support standing ankle DF/LE: 3x12  Neuro Re-ed: CGA and gait belt utilized Gait along 25' pathway: X6 laps   Figure 8's  for R/L turns: x6 laps   Obstacle navigation -->  3x 6" hurdles -->  ambulating over unstable surface: x6 laps. Appears difficulty with stance phase on RLE leading to decreased LLE foot clearance. Min ot mod VC's for hip flexion instead of compensatory circumduction.   SLS with contralateral LE resting on cone for support: 2x30 sec, two finger support   Ambulating 100' circles in gym: 3 laps working on changes in gait speed and horizontal and vertical head turns. Given in random order to work on reaction time, dynamic balance.  Additional 3 laps adding cognitive dual task along with tasks above (naming kitchen utensils and jewelry stones). CGA. Some variable step lengths and slowed gait speed noted but no significant LOB.   Tandem stance no UE support: 2x30 sec/LE under BOS   Normal stance on foam: 1 minutes   Narrow stance on foam: 2x30 sec    PATIENT EDUCATION:  Education details: form and education for appropriate exercise and explanation of tests and measures  Person educated: Patient Education method: Programmer, multimedia, Demonstration, Verbal cues, and Handouts Education comprehension: verbalized understanding, returned demonstration, and verbal cues required     HOME EXERCISE PROGRAM: Access Code: [email or web address] URL: [email or web address] Date: 10/16/2022 Prepared by: Ellin Goodie  Exercises - Sit to Stand  - 1 x daily - 3 x weekly - 3 sets - 10 reps - Prone Quadriceps Stretch with Strap  - 1 x daily - 3 reps - 30-60 sec  hold - Standing Hip Abduction with Counter Support  - 3 x weekly - 3 sets - 10 reps - Standing March  - 3 x weekly - 3 sets - 10 reps - Single Leg Heel Raise with Counter Support  - 3 x weekly - 3 sets - 10 reps - Side Lunge Adductor Stretch  - 1 x daily - 3 reps - 30 hold -  Toe Raises with Counter Support  - 3 x weekly - 3 sets - 10 reps - Half Tandem Stance Balance with Eyes Closed  - 1 x daily - 5 reps - 30 sec  hold  ASSESSMENT:   CLINICAL  IMPRESSION:  Pt shows improvement in LE strength and self-perception of balance with ability to perform sit to stand from a decreased height and increase in FOTO score. Her stiffness in her left posterior knee resolved with HS stretch likely indicating HS stiffness. She was better able to perform modified SLS with increased base with yoga block with decreased UE support and minimal ankle and hip strategy. Pt will continue to benefit from skilled PT services to progress balance deficits to address remaining deficits.     OBJECTIVE IMPAIRMENTS Abnormal gait, decreased balance, decreased ROM, and decreased strength.    ACTIVITY LIMITATIONS squatting, stairs, and locomotion level   PARTICIPATION LIMITATIONS: shopping, community activity, and yard work   PERSONAL FACTORS Age and 3+ comorbidities: chronic kidney disease, HTN, and h/o right knee patella fracture   are also affecting patient's functional outcome.    REHAB POTENTIAL: Good   CLINICAL DECISION MAKING: Stable/uncomplicated   EVALUATION COMPLEXITY: Low     GOALS: Goals reviewed with patient? No   SHORT TERM GOALS: Target date: 10/18/2022  Pt will be independent with HEP in order to improve strength and balance in order to decrease fall risk and improve function at home and work. Baseline: NT  Goal status: Ongoing    2.  Pt will decrease 5TSTS by at least 3 seconds in order to demonstrate clinically significant improvement in LE strength.  Baseline: 9 sec  Goal status: Ongoing    3.  Pt will increase by at least 0.13 m/s in order to demonstrate clinically significant improvement in community ambulation. Baseline: 0.50 m/sec  Goal status: Ongoing        LONG TERM GOALS: Target date: 12/13/2022    Patient will have improved function and activity level as evidenced by an increase in FOTO score by 10 points or more. Baseline: 43/100 with target of 52  Goal status: Ongoing    2.  Patient will improve hip and knee  strength by 1/3 MMT (I.e 4- to 4) for improved hip stability to improve steadiness of gait to decrease risk of falling.  Baseline: Hip Adduction R/L 3+/3+, Hip Abduction R/L 4-/4-, Hip Ext R/L  4-/-, Knee Flex R/L 4/4, Knee Ext R/L4/4 Goal status: Ongoing    3.  Patient will demonstrate reduced falls risk as evidenced by Dynamic Gait Index (DGI) >19/24. Baseline:15/24 Goal status: Ongoing    4.  Patient will improve 5 x STS to 11 sec to demonstrate improved LE strength that places her at a decreased risk for falls. Baseline: 19 sec  Goal status: Ongoing    5.  Patient will demonstrate a gait speed < 1.0 m/sec to show that she capable of a gait speed that places her at a decreased risk for falls.  Baseline: 0.50 m/sec  Goal status: Ongoing    6.  Patient will perform 8>= sit to stands within 30 sec to show LE endurance that places her at a decreased risk for falls.  Baseline: 9 reps  Goal status: Ongoing      PLAN: PT FREQUENCY: 1-2x/week   PT DURATION: 10 weeks   PLANNED INTERVENTIONS: Therapeutic exercises, Neuromuscular re-education, Balance training, Gait training, Joint mobilization, Joint manipulation, Stair training, DME instructions, Aquatic Therapy, Dry Needling, Spinal manipulation,  Spinal mobilization, Cryotherapy, Moist heat, Manual therapy, and Re-evaluation   PLAN FOR NEXT SESSION: Continue to progress LE strengthening exercises with OTAGO exercises and begin dynamic balance exercises     Ellin Goodie PT, DPT  Physical Therapist- [facility] 10/23/2022, 12:23 PM

## 2022-10-25 ENCOUNTER — Ambulatory Visit: Payer: Medicare Other | Admitting: Physical Therapy

## 2022-10-25 ENCOUNTER — Encounter: Payer: Self-pay | Admitting: Physical Therapy

## 2022-10-25 ENCOUNTER — Encounter: Payer: Medicare Other | Admitting: Physical Therapy

## 2022-10-25 DIAGNOSIS — M25561 Pain in right knee: Secondary | ICD-10-CM | POA: Diagnosis not present

## 2022-10-25 DIAGNOSIS — R262 Difficulty in walking, not elsewhere classified: Secondary | ICD-10-CM | POA: Diagnosis not present

## 2022-10-25 DIAGNOSIS — M6281 Muscle weakness (generalized): Secondary | ICD-10-CM | POA: Diagnosis not present

## 2022-10-25 NOTE — Therapy (Signed)
OUTPATIENT PHYSICAL THERAPY TREATMENT NOTE   Patient Name: Lori [name] MRN: [id number] DOB:1934-04-27, 88 y.o., female Today's Date: 10/25/2022  PCP: Dr. Einar Pheasant  REFERRING PROVIDER: Dr. Einar Pheasant   END OF SESSION:   PT End of Session - 10/25/22 1107     Visit Number 7    Number of Visits 20    Date for PT Re-Evaluation 12/13/22    Authorization Type Medicare    Authorization Time Period 10/04/22-12/13/22    Authorization - Visit Number 7    Authorization - Number of Visits 20    Progress Note Due on Visit 10    PT Start Time 1105    PT Stop Time 0865    PT Time Calculation (min) 40 min    Equipment Utilized During Treatment Gait belt    Activity Tolerance Patient tolerated treatment well    Behavior During Therapy WFL for tasks assessed/performed              Past Medical History:  Diagnosis Date   Anemia    iron deficient   Arthritis    Autoimmune hepatitis ([place]) 05/10/2016   Hep C   B12 deficiency    Barrett esophagus 01/2016   Bradycardia    Chronic kidney disease    stage III   Diverticulosis    GERD (gastroesophageal reflux disease)    Hypertension    Hypothyroidism    Ulcer disease    Past Surgical History:  Procedure Laterality Date   CATARACT EXTRACTION W/PHACO Left 08/30/2021   Procedure: CATARACT EXTRACTION PHACO AND INTRAOCULAR LENS PLACEMENT ([place]) LEFT 8.21 01:00.3;  Surgeon: Leandrew Koyanagi, MD;  Location: [place];  Service: Ophthalmology;  Laterality: Left;   CATARACT EXTRACTION W/PHACO Right 09/13/2021   Procedure: CATARACT EXTRACTION PHACO AND INTRAOCULAR LENS PLACEMENT ([place]) RIGHT;  Surgeon: Leandrew Koyanagi, MD;  Location: [place];  Service: Ophthalmology;  Laterality: Right;  8.11 00:59.8   CHOLECYSTECTOMY N/A 06/25/2018   Procedure: LAPAROSCOPIC CHOLECYSTECTOMY WITH INTRAOPERATIVE CHOLANGIOGRAM;  Surgeon: Robert Bellow, MD;  Location: [facility];  Service: General;  Laterality:  N/A;   ESOPHAGOGASTRODUODENOSCOPY (EGD) WITH PROPOFOL N/A 01/30/2016   Procedure: ESOPHAGOGASTRODUODENOSCOPY (EGD) WITH PROPOFOL;  Surgeon: Lollie Sails, MD;  Location[facility] ENDOSCOPY;  Service: Endoscopy;  Laterality: N/A;   ESOPHAGOGASTRODUODENOSCOPY (EGD) WITH PROPOFOL N/A 01/14/2018   Procedure: ESOPHAGOGASTRODUODENOSCOPY (EGD) WITH PROPOFOL;  Surgeon: Lollie Sails, MD;  Location: [facility]COPY;  Service: Endoscopy;  Laterality: N/A;   ESOPHAGOGASTRODUODENOSCOPY (EGD) WITH PROPOFOL N/A 05/20/2018   Procedure: ESOPHAGOGASTRODUODENOSCOPY (EGD) WITH PROPOFOL;  Surgeon: Lollie Sails, MD;  Location: [facility] ENDOSCOPY;  Service: Endoscopy;  Laterality: N/A;   ESOPHAGOGASTRODUODENOSCOPY (EGD) WITH PROPOFOL N/A 01/13/2019   Procedure: ESOPHAGOGASTRODUODENOSCOPY (EGD) WITH PROPOFOL;  Surgeon: Lollie Sails, MD;  Location: [facility] ENDOSCOPY;  Service: Endoscopy;  Laterality: N/A;   HERNIA REPAIR     lap hiatal hernia repair  07/08/08   LEFT OOPHORECTOMY  1993   benign ovarian cyst   TUBAL LIGATION  1981   Patient Active Problem List   Diagnosis Date Noted   Bladder prolapse, female, acquired 05/06/2022   Hematoma 10/22/2021   Right knee pain 10/13/2021   SVT (supraventricular tachycardia) 10/07/2021   PSVT (paroxysmal supraventricular tachycardia) 10/06/2021   Numbness in feet 06/18/2021   CKD (chronic kidney disease) 06/12/2021   Dizziness 04/10/2021   Chest tightness 02/07/2021   Diarrhea 10/09/2020   Abdominal discomfort 09/27/2019   Gall bladder polyp 05/07/2018   Breast tenderness 05/07/2018  Hyperglycemia 09/10/2016   Fullness of breast 09/10/2016   Autoimmune hepatitis ([place]) 05/10/2016   MGUS (monoclonal gammopathy of unknown significance) 06/06/2015   LUQ fullness 06/06/2015   Health care maintenance 06/06/2015   Fatty tumor 03/07/2015   BMI 32.0-32.9,adult 01/02/2015   Abnormal liver function tests 01/02/2015   Hand cramps 11/15/2014   Leg cramps 06/13/2014    Osteopenia 03/02/2013   Hyperbilirubinemia 03/02/2013   Hypertension 12/22/2012   Gastroparesis 12/22/2012   Barrett's esophagus with esophagitis 12/22/2012   Hypothyroidism 12/22/2012   Anemia 12/22/2012   B12 deficiency 12/22/2012   CKD (chronic kidney disease) stage 3, GFR 30-59 ml/min (HCC) 12/22/2012    REFERRING DIAG: M25.561 (ICD-10-CM) - Right knee pain, unspecified chronicity  THERAPY DIAG:  Muscle weakness (generalized)  Difficulty in walking, not elsewhere classified  Rationale for Evaluation and Treatment Rehabilitation  PERTINENT HISTORY: Pt was initially sent her by PCP for right knee pain from a patella fracture that she sustained back in February.  She occasionally feels pain in her left knee, but she does not identify this as a problem. Pt describes feeling like she is walking stiff where she is having to lift the right leg upstairs.  She feels afraid of falling and she walks slower as a result. She does not normally use a SPC with the exception of shopping in a grocery store.   PRECAUTIONS: Falls   SUBJECTIVE: Pt reports that she was able to achieve 8 reps with sit to stand with added weight. She continues to feel soreness but nothing that is stopping her from doing exercise.   PAIN:  Are you having pain? No   OBJECTIVE:                 BP 180/54 HR 58 SpO2 100      DIAGNOSTIC FINDINGS: CLINICAL DATA:  Right knee pain, fall   EXAM: RIGHT KNEE - 1-2 VIEW   COMPARISON:  None.   FINDINGS: Advanced tricompartment degenerative changes with joint space narrowing and spurring. Moderate joint effusion. No acute bony abnormality. Specifically, no fracture, subluxation, or dislocation.   IMPRESSION: No acute bony abnormality.  Degenerative changes and joint effusion.     Electronically Signed   By: Rolm Baptise M.D.   On: 10/16/2021 20:51   PATIENT SURVEYS:  FOTO 45/100 with target of 52    COGNITION:           Overall cognitive status: Within  functional limits for tasks assessed                          SENSATION: WFL       MUSCLE LENGTH: Hamstrings: Right 60 deg; Left 60 deg     POSTURE: rounded shoulders and forward head   PALPATION: TTP of anterior surface of bilateral knees    LOWER EXTREMITY ROM:     Active /PROM Right 10/04/2022 Left 10/04/2022  Hip flexion 120 120  Hip extension 30 30  Hip abduction 45 45  Hip adduction 30 30  Hip internal rotation 45 45  Hip external rotation 45 45  Knee flexion 110/120 110/120  Knee extension 0 0  Ankle dorsiflexion      Ankle plantarflexion      Ankle inversion      Ankle eversion       (Blank rows = not tested)        LOWER EXTREMITY MMT:   MMT Right eval Left eval  Hip flexion  4 4  Hip extension 4- 4-  Hip abduction 4- 4-  Hip adduction 3+ 3+  Hip internal rotation      Hip external rotation      Knee flexion 4 4  Knee extension 4 4  Ankle dorsiflexion      Ankle plantarflexion      Ankle inversion      Ankle eversion       (Blank rows = not tested)   LOWER EXTREMITY SPECIAL TESTS:  Hip special tests: Marcello Moores test: negative and Ely's test: positive    FUNCTIONAL TESTS:  5 times sit to stand: 19 sec  30 seconds chair stand test: 9 reps  Dynamic Gait Index: NT    GAIT: Distance walked: 10 meters  Assistive device utilized: None Level of assistance: Complete Independence Comments: Decreased step length on RLE        TODAY'S TREATMENT:  10/25/22   TM at 1.3 mph for 5 min  Sit to stand with #5 DB 1 x 10  Sit to stand with #8 gallon water jug 2 x 10  Side step up onto 4 inch step with BUE support  2 x 10  Side step up onto 6 inch steps on stairs with BUE support 2 x 10  Long Arch Quad 5 lb AW on RLE 1 X 10  Mini-Squats with BUE support with butt taps on mat at 22 inches 2 x 10  -mod VC to shift weight posteriorly onto heels   10/23/22  NMR  Modified SLS on yoga block 4 x 30 sec   THEREX   Seated HS Stretch 2 x 30 sec  FOTO:  49/100 with target of 52 Sit to Stand from 18 inch mat height - 1 x 10  Sit to Stand from 18 inch mat height with #8 DB- 1 x 5  Sit to Stand from 18 inch mat height with #5 DB- 1 x 5            Standing HS Curls 3 x 10    10/18/22   There.ex: TM for warm up with BUE support. 5 min at 1.3 MPH. Discussion of HEP, balance deficits during warm up.  Standing Heel to toe raises: 3x12  SUE support standing ankle DF/LE: 3x12  Neuro Re-ed: CGA and gait belt utilized Gait along 25' pathway: X6 laps   Figure 8's for R/L turns: x6 laps   Obstacle navigation -->  3x 6" hurdles -->  ambulating over unstable surface: x6 laps. Appears difficulty with stance phase on RLE leading to decreased LLE foot clearance. Min ot mod VC's for hip flexion instead of compensatory circumduction.   SLS with contralateral LE resting on cone for support: 2x30 sec, two finger support   Ambulating 100' circles in gym: 3 laps working on changes in gait speed and horizontal and vertical head turns. Given in random order to work on reaction time, dynamic balance.  Additional 3 laps adding cognitive dual task along with tasks above (naming kitchen utensils and jewelry stones). CGA. Some variable step lengths and slowed gait speed noted but no significant LOB.   Tandem stance no UE support: 2x30 sec/LE under BOS   Normal stance on foam: 1 minutes   Narrow stance on foam: 2x30 sec    PATIENT EDUCATION:  Education details: form and education for appropriate exercise and explanation of tests and measures  Person educated: Patient Education method: Explanation, Demonstration, Verbal cues, and Handouts Education comprehension: verbalized understanding, returned demonstration, and  verbal cues required     HOME EXERCISE PROGRAM: Access Code: [email or web address] URL: [email or web address] Date: 10/25/2022 Prepared by: Bradly Chris  Exercises - Sit to Stand  - 3 x weekly - 3 sets - 10 reps - Prone Quadriceps  Stretch with Strap  - 1 x daily - 3 reps - 30-60 sec  hold - Standing March  - 3 x weekly - 3 sets - 10 reps - Single Leg Heel Raise with Counter Support  - 3 x weekly - 3 sets - 10 reps - Side Lunge Adductor Stretch  - 1 x daily - 3 reps - 30 hold - Toe Raises with Counter Support  - 3 x weekly - 3 sets - 10 reps - Half Tandem Stance Balance with Eyes Closed  - 1 x daily - 5 reps - 30 sec  hold - Seated Table Hamstring Stretch  - 1 x daily - 3 reps - 30-60 sec hold - Standing Knee Flexion AROM with Chair Support  - 3 x weekly - 3 sets - 10 reps - Lateral Step Up with Counter Support  - 3 x weekly - 3 sets - 10 reps - Mini Squat  - 3 x weekly - 3 sets - 10 reps  ASSESSMENT:   CLINICAL IMPRESSION:  Pt continues to show improvement with LE strength with ability to perform sit to stand with increased resistance and side step up and downs. She will continues to show weakness in RLE compared to LLE with increased foot drag during TM walk and increased difficulty with side step compared to LLE. Pt will continue to benefit from skilled PT services to progress balance deficits to address remaining deficits.     OBJECTIVE IMPAIRMENTS Abnormal gait, decreased balance, decreased ROM, and decreased strength.    ACTIVITY LIMITATIONS squatting, stairs, and locomotion level   PARTICIPATION LIMITATIONS: shopping, community activity, and yard work   [place] Age and 3+ comorbidities: chronic kidney disease, HTN, and h/o right knee patella fracture   are also affecting patient's functional outcome.    REHAB POTENTIAL: Good   CLINICAL DECISION MAKING: Stable/uncomplicated   EVALUATION COMPLEXITY: Low     GOALS: Goals reviewed with patient? No   SHORT TERM GOALS: Target date: 10/18/2022  Pt will be independent with HEP in order to improve strength and balance in order to decrease fall risk and improve function at home and work. Baseline: Performing independently  Goal status: Ongoing     2.  Pt will decrease 5TSTS by at least 3 seconds in order to demonstrate clinically significant improvement in LE strength.  Baseline: 9 sec  Goal status: Ongoing    3.  Pt will increase 10MWT by at least 0.13 m/s in order to demonstrate clinically significant improvement in community ambulation. Baseline: 0.50 m/sec  Goal status: Ongoing        LONG TERM GOALS: Target date: 12/13/2022    Patient will have improved function and activity level as evidenced by an increase in FOTO score by 10 points or more. Baseline: 43/100 with target of 52  Goal status: Ongoing    2.  Patient will improve hip and knee strength by 1/3 MMT (I.e 4- to 4) for improved hip stability to improve steadiness of gait to decrease risk of falling.  Baseline: Hip Adduction R/L 3+/3+, Hip Abduction R/L 4-/4-, Hip Ext R/L  4-/-, Knee Flex R/L 4/4, Knee Ext R/L4/4 Goal status: Ongoing    3.  Patient will demonstrate reduced falls risk as evidenced  by Dynamic Gait Index (DGI) >19/24. Baseline:15/24 Goal status: Ongoing    4.  Patient will improve 5 x STS to 11 sec to demonstrate improved LE strength that places her at a decreased risk for falls. Baseline: 19 sec  Goal status: Ongoing    5.  Patient will demonstrate a gait speed < 1.0 m/sec to show that she capable of a gait speed that places her at a decreased risk for falls.  Baseline: 0.50 m/sec  Goal status: Ongoing    6.  Patient will perform 8>= sit to stands within 30 sec to show LE endurance that places her at a decreased risk for falls.  Baseline: 9 reps  Goal status: Ongoing      PLAN: PT FREQUENCY: 1-2x/week   PT DURATION: 10 weeks   PLANNED INTERVENTIONS: Therapeutic exercises, Neuromuscular re-education, Balance training, Gait training, Joint mobilization, Joint manipulation, Stair training, DME instructions, Aquatic Therapy, Dry Needling, Spinal manipulation, Spinal mobilization, Cryotherapy, Moist heat, Manual therapy, and Re-evaluation    PLAN FOR NEXT SESSION: Continue to progress LE strengthening exercises with OTAGO exercises and begin dynamic balance exercises   Bradly Chris PT, DPT  Physical Therapist- [facility] 10/25/2022, 11:08 AM

## 2022-10-29 ENCOUNTER — Encounter: Payer: Medicare Other | Admitting: Physical Therapy

## 2022-10-30 ENCOUNTER — Encounter: Payer: Self-pay | Admitting: Physical Therapy

## 2022-10-30 ENCOUNTER — Ambulatory Visit: Payer: Medicare Other | Admitting: Physical Therapy

## 2022-10-30 DIAGNOSIS — M6281 Muscle weakness (generalized): Secondary | ICD-10-CM

## 2022-10-30 DIAGNOSIS — R262 Difficulty in walking, not elsewhere classified: Secondary | ICD-10-CM

## 2022-10-30 DIAGNOSIS — M25561 Pain in right knee: Secondary | ICD-10-CM | POA: Diagnosis not present

## 2022-10-30 NOTE — Therapy (Signed)
OUTPATIENT PHYSICAL THERAPY TREATMENT NOTE   Patient Name: Lori [name] MRN: [id number] DOB:25-Jun-1934, 88 y.o., female Today's Date: 10/30/2022  PCP: Dr. Einar Pheasant  REFERRING PROVIDER: Dr. Einar Pheasant   END OF SESSION:   PT End of Session - 10/30/22 1151     Visit Number 8    Number of Visits 20    Date for PT Re-Evaluation 12/13/22    Authorization Type Medicare    Authorization Time Period 10/04/22-12/13/22    Authorization - Visit Number 8    Authorization - Number of Visits 20    Progress Note Due on Visit 10    PT Start Time 4481    PT Stop Time 1230    PT Time Calculation (min) 45 min    Equipment Utilized During Treatment Gait belt    Activity Tolerance Patient tolerated treatment well    Behavior During Therapy WFL for tasks assessed/performed              Past Medical History:  Diagnosis Date   Anemia    iron deficient   Arthritis    Autoimmune hepatitis ([place]) 05/10/2016   Hep C   B12 deficiency    Barrett esophagus 01/2016   Bradycardia    Chronic kidney disease    stage III   Diverticulosis    GERD (gastroesophageal reflux disease)    Hypertension    Hypothyroidism    Ulcer disease    Past Surgical History:  Procedure Laterality Date   CATARACT EXTRACTION W/PHACO Left 08/30/2021   Procedure: CATARACT EXTRACTION PHACO AND INTRAOCULAR LENS PLACEMENT ([place]) LEFT 8.21 01:00.3;  Surgeon: Leandrew Koyanagi, MD;  Location: [place];  Service: Ophthalmology;  Laterality: Left;   CATARACT EXTRACTION W/PHACO Right 09/13/2021   Procedure: CATARACT EXTRACTION PHACO AND INTRAOCULAR LENS PLACEMENT ([place]) RIGHT;  Surgeon: Leandrew Koyanagi, MD;  Location: [place];  Service: Ophthalmology;  Laterality: Right;  8.11 00:59.8   CHOLECYSTECTOMY N/A 06/25/2018   Procedure: LAPAROSCOPIC CHOLECYSTECTOMY WITH INTRAOPERATIVE CHOLANGIOGRAM;  Surgeon: Robert Bellow, MD;  Location: [facility];  Service: General;  Laterality:  N/A;   ESOPHAGOGASTRODUODENOSCOPY (EGD) WITH PROPOFOL N/A 01/30/2016   Procedure: ESOPHAGOGASTRODUODENOSCOPY (EGD) WITH PROPOFOL;  Surgeon: Lollie Sails, MD;  Location: [facility] ENDOSCOPY;  Service: Endoscopy;  Laterality: N/A;   ESOPHAGOGASTRODUODENOSCOPY (EGD) WITH PROPOFOL N/A 01/14/2018   Procedure: ESOPHAGOGASTRODUODENOSCOPY (EGD) WITH PROPOFOL;  Surgeon: Lollie Sails, MD;  Location: [facility] ENDOSCOPY;  Service: Endoscopy;  Laterality: N/A;   ESOPHAGOGASTRODUODENOSCOPY (EGD) WITH PROPOFOL N/A 05/20/2018   Procedure: ESOPHAGOGASTRODUODENOSCOPY (EGD) WITH PROPOFOL;  Surgeon: Lollie Sails, MD;  Location: [facility] ENDOSCOPY;  Service: Endoscopy;  Laterality: N/A;   ESOPHAGOGASTRODUODENOSCOPY (EGD) WITH PROPOFOL N/A 01/13/2019   Procedure: ESOPHAGOGASTRODUODENOSCOPY (EGD) WITH PROPOFOL;  Surgeon: Lollie Sails, MD;  Location: [facility] ENDOSCOPY;  Service: Endoscopy;  Laterality: N/A;   HERNIA REPAIR     lap hiatal hernia repair  07/08/08   LEFT OOPHORECTOMY  1993   benign ovarian cyst   TUBAL LIGATION  1981   Patient Active Problem List   Diagnosis Date Noted   Bladder prolapse, female, acquired 05/06/2022   Hematoma 10/22/2021   Right knee pain 10/13/2021   SVT (supraventricular tachycardia) 10/07/2021   PSVT (paroxysmal supraventricular tachycardia) 10/06/2021   Numbness in feet 06/18/2021   CKD (chronic kidney disease) 06/12/2021   Dizziness 04/10/2021   Chest tightness 02/07/2021   Diarrhea 10/09/2020   Abdominal discomfort 09/27/2019   Gall bladder polyp 05/07/2018   Breast tenderness 05/07/2018  Hyperglycemia 09/10/2016   Fullness of breast 09/10/2016   Autoimmune hepatitis ([place]) 05/10/2016   MGUS (monoclonal gammopathy of unknown significance) 06/06/2015   LUQ fullness 06/06/2015   Health care maintenance 06/06/2015   Fatty tumor 03/07/2015   BMI 32.0-32.9,adult 01/02/2015   Abnormal liver function tests 01/02/2015   Hand cramps 11/15/2014   Leg cramps 06/13/2014    Osteopenia 03/02/2013   Hyperbilirubinemia 03/02/2013   Hypertension 12/22/2012   Gastroparesis 12/22/2012   Barrett's esophagus with esophagitis 12/22/2012   Hypothyroidism 12/22/2012   Anemia 12/22/2012   B12 deficiency 12/22/2012   CKD (chronic kidney disease) stage 3, GFR 30-59 ml/min (HCC) 12/22/2012    REFERRING DIAG: M25.561 (ICD-10-CM) - Right knee pain, unspecified chronicity  THERAPY DIAG:  Muscle weakness (generalized)  Difficulty in walking, not elsewhere classified  Rationale for Evaluation and Treatment Rehabilitation  PERTINENT HISTORY: Pt was initially sent her by PCP for right knee pain from a patella fracture that she sustained back in February.  She occasionally feels pain in her left knee, but she does not identify this as a problem. Pt describes feeling like she is walking stiff where she is having to lift the right leg upstairs.  She feels afraid of falling and she walks slower as a result. She does not normally use a SPC with the exception of shopping in a grocery store.   PRECAUTIONS: Falls   SUBJECTIVE: Pt reports increased stiffness while performing exercises especially mini-squats and she thinks it might have something to do with the weather. She feel stiffness in the back of her legs.   PAIN:  Are you having pain? No   OBJECTIVE:                 BP 180/54 HR 58 SpO2 100      DIAGNOSTIC FINDINGS: CLINICAL DATA:  Right knee pain, fall   EXAM: RIGHT KNEE - 1-2 VIEW   COMPARISON:  None.   FINDINGS: Advanced tricompartment degenerative changes with joint space narrowing and spurring. Moderate joint effusion. No acute bony abnormality. Specifically, no fracture, subluxation, or dislocation.   IMPRESSION: No acute bony abnormality.  Degenerative changes and joint effusion.     Electronically Signed   By: Rolm Baptise M.D.   On: 10/16/2021 20:51   PATIENT SURVEYS:  FOTO 45/100 with target of 52    COGNITION:           Overall cognitive  status: Within functional limits for tasks assessed                          SENSATION: WFL       MUSCLE LENGTH: Hamstrings: Right 60 deg; Left 60 deg     POSTURE: rounded shoulders and forward head   PALPATION: TTP of anterior surface of bilateral knees    LOWER EXTREMITY ROM:     Active /PROM Right 10/04/2022 Left 10/04/2022  Hip flexion 120 120  Hip extension 30 30  Hip abduction 45 45  Hip adduction 30 30  Hip internal rotation 45 45  Hip external rotation 45 45  Knee flexion 110/120 110/120  Knee extension 0 0  Ankle dorsiflexion      Ankle plantarflexion      Ankle inversion      Ankle eversion       (Blank rows = not tested)        LOWER EXTREMITY MMT:   MMT Right eval Left eval  Hip flexion 4  4  Hip extension 4- 4-  Hip abduction 4- 4-  Hip adduction 3+ 3+  Hip internal rotation      Hip external rotation      Knee flexion 4 4  Knee extension 4 4  Ankle dorsiflexion      Ankle plantarflexion      Ankle inversion      Ankle eversion       (Blank rows = not tested)   LOWER EXTREMITY SPECIAL TESTS:  Hip special tests: Marcello Moores test: negative and Ely's test: positive    FUNCTIONAL TESTS:  5 times sit to stand: 19 sec  30 seconds chair stand test: 9 reps  Dynamic Gait Index: NT    GAIT: Distance walked: 10 meters  Assistive device utilized: None Level of assistance: Complete Independence Comments: Decreased step length on RLE        TODAY'S TREATMENT:  10/30/22:            TM at 1.3 mph for 5 min             Seated HS Stretch with calf stretch 3 x 60 sec             5xSTS: >30 sec             30 sec: 2 reps             Sit to Stand from 20 inch mat height 2 x 8             Sit to Stand from 19 inch mat height 1 x 8              -min VC to train for power with fast stand and low sit             Step Up and Step Down using 1 UE  Step Up and Step Down using BUE support 3 x 10 on 4 inch step   10/25/22   TM at 1.3 mph for 5 min  Sit  to stand with #5 DB 1 x 10  Sit to stand with #8 gallon water jug 2 x 10  Side step up onto 4 inch step with BUE support  2 x 10  Side step up onto 6 inch steps on stairs with BUE support 2 x 10  Long Arch Quad 5 lb AW on RLE 1 X 10  Mini-Squats with BUE support with butt taps on mat at 22 inches 2 x 10  -mod VC to shift weight posteriorly onto heels   10/23/22  NMR  Modified SLS on yoga block 4 x 30 sec   THEREX   Seated HS Stretch 2 x 30 sec  FOTO: 49/100 with target of 52 Sit to Stand from 18 inch mat height - 1 x 10  Sit to Stand from 18 inch mat height with #8 DB- 1 x 5  Sit to Stand from 18 inch mat height with #5 DB- 1 x 5            Standing HS Curls 3 x 10    10/18/22   There.ex: TM for warm up with BUE support. 5 min at 1.3 MPH. Discussion of HEP, balance deficits during warm up.  Standing Heel to toe raises: 3x12  SUE support standing ankle DF/LE: 3x12  Neuro Re-ed: CGA and gait belt utilized Gait along 25' pathway: X6 laps   Figure 8's for R/L turns: x6 laps   Obstacle navigation -->  3x  6" hurdles -->  ambulating over unstable surface: x6 laps. Appears difficulty with stance phase on RLE leading to decreased LLE foot clearance. Min ot mod VC's for hip flexion instead of compensatory circumduction.   SLS with contralateral LE resting on cone for support: 2x30 sec, two finger support   Ambulating 100' circles in gym: 3 laps working on changes in gait speed and horizontal and vertical head turns. Given in random order to work on reaction time, dynamic balance.  Additional 3 laps adding cognitive dual task along with tasks above (naming kitchen utensils and jewelry stones). CGA. Some variable step lengths and slowed gait speed noted but no significant LOB.   Tandem stance no UE support: 2x30 sec/LE under BOS   Normal stance on foam: 1 minutes   Narrow stance on foam: 2x30 sec    PATIENT EDUCATION:  Education details: form and education for appropriate  exercise and explanation of tests and measures  Person educated: Patient Education method: Consulting civil engineer, Demonstration, Verbal cues, and Handouts Education comprehension: verbalized understanding, returned demonstration, and verbal cues required     HOME EXERCISE PROGRAM: Access Code: [email or web address] URL: [email or web address] Date: 10/30/2022 Prepared by: Bradly Chris  Exercises - Sit to Stand  - 3 x weekly - 3 sets - 10 reps - Prone Quadriceps Stretch with Strap  - 1 x daily - 3 reps - 30-60 sec  hold - Standing March  - 3 x weekly - 3 sets - 10 reps - Single Leg Heel Raise with Counter Support  - 3 x weekly - 3 sets - 10 reps - Side Lunge Adductor Stretch  - 1 x daily - 3 reps - 30 hold - Toe Raises with Counter Support  - 3 x weekly - 3 sets - 10 reps - Half Tandem Stance Balance with Eyes Closed  - 1 x daily - 5 reps - 30 sec  hold - Seated Table Hamstring Stretch  - 1 x daily - 3 reps - 30-60 sec hold - Standing Knee Flexion AROM with Chair Support  - 3 x weekly - 3 sets - 10 reps - Lateral Step Up with Counter Support  - 3 x weekly - 3 sets - 10 reps - Mini Squat  - 3 x weekly - 3 sets - 10 reps - Step Up  - 3 x weekly - 3 sets - 10 reps ASSESSMENT:   CLINICAL IMPRESSION:  Pt shows a regression in LE strength with inability to perform LE strengthening exercises of similar difficulty as last session. Unclear about why given pt did not change diet or sleep patterns or exhibit SOB or display cardiovascular symptoms. Given inability to perform exercises to 8 reps, exercises regressed to include decreased weight. She still displays LE strength and endurance that places her at an increased risk for falling. Pt will continue to benefit from skilled PT services to progress balance deficits to address remaining deficits.   OBJECTIVE IMPAIRMENTS Abnormal gait, decreased balance, decreased ROM, and decreased strength.    ACTIVITY LIMITATIONS squatting, stairs, and locomotion  level   PARTICIPATION LIMITATIONS: shopping, community activity, and yard work   [place] Age and 3+ comorbidities: chronic kidney disease, HTN, and h/o right knee patella fracture   are also affecting patient's functional outcome.    REHAB POTENTIAL: Good   CLINICAL DECISION MAKING: Stable/uncomplicated   EVALUATION COMPLEXITY: Low     GOALS: Goals reviewed with patient? No   SHORT TERM GOALS: Target date: 10/18/2022  Pt will be independent with  HEP in order to improve strength and balance in order to decrease fall risk and improve function at home and work. Baseline: Performing independently  Goal status: Ongoing    2.  Pt will decrease 5TSTS by at least 3 seconds in order to demonstrate clinically significant improvement in LE strength.  Baseline: 9 sec  Goal status: Ongoing    3.  Pt will increase 10MWT by at least 0.13 m/s in order to demonstrate clinically significant improvement in community ambulation. Baseline: 0.50 m/sec  Goal status: Ongoing        LONG TERM GOALS: Target date: 12/13/2022    Patient will have improved function and activity level as evidenced by an increase in FOTO score by 10 points or more. Baseline: 43/100 with target of 52  Goal status: Ongoing    2.  Patient will improve hip and knee strength by 1/3 MMT (I.e 4- to 4) for improved hip stability to improve steadiness of gait to decrease risk of falling.  Baseline: Hip Adduction R/L 3+/3+, Hip Abduction R/L 4-/4-, Hip Ext R/L  4-/-, Knee Flex R/L 4/4, Knee Ext R/L4/4 Goal status: Ongoing    3.  Patient will demonstrate reduced falls risk as evidenced by Dynamic Gait Index (DGI) >19/24. Baseline:15/24 Goal status: Ongoing    4.  Patient will improve 5 x STS to 11 sec to demonstrate improved LE strength that places her at a decreased risk for falls. Baseline: 19 sec  Goal status: Ongoing    5.  Patient will demonstrate a gait speed < 1.0 m/sec to show that she capable of a gait speed  that places her at a decreased risk for falls.  Baseline: 0.50 m/sec  Goal status: Ongoing    6.  Patient will perform 8>= sit to stands within 30 sec to show LE endurance that places her at a decreased risk for falls.  Baseline: 9 reps  Goal status: Ongoing      PLAN: PT FREQUENCY: 1-2x/week   PT DURATION: 10 weeks   PLANNED INTERVENTIONS: Therapeutic exercises, Neuromuscular re-education, Balance training, Gait training, Joint mobilization, Joint manipulation, Stair training, DME instructions, Aquatic Therapy, Dry Needling, Spinal manipulation, Spinal mobilization, Cryotherapy, Moist heat, Manual therapy, and Re-evaluation   PLAN FOR NEXT SESSION: Take FOTO! Progress LE strengthening exercises to include increased step height and decreased mat height. Do dynamic balance exercises: tight rope walk, walking backwards, etc.    Bradly Chris PT, DPT  Physical Therapist- [facility] 10/30/2022, 11:52 AM

## 2022-11-01 ENCOUNTER — Ambulatory Visit: Payer: Medicare Other | Attending: Internal Medicine | Admitting: Physical Therapy

## 2022-11-01 ENCOUNTER — Encounter: Payer: Self-pay | Admitting: Physical Therapy

## 2022-11-01 DIAGNOSIS — R262 Difficulty in walking, not elsewhere classified: Secondary | ICD-10-CM

## 2022-11-01 DIAGNOSIS — M6281 Muscle weakness (generalized): Secondary | ICD-10-CM | POA: Diagnosis not present

## 2022-11-01 NOTE — Therapy (Signed)
OUTPATIENT PHYSICAL THERAPY TREATMENT NOTE   Patient Name: Lori [name] MRN: [id number] DOB:[birth year], 88 y.o., female Today's Date: 11/01/2022  PCP: Dr. Einar Pheasant  REFERRING PROVIDER: Dr. Einar Pheasant   END OF SESSION:   PT End of Session - 11/01/22 0928     Visit Number 9    Number of Visits 20    Date for PT Re-Evaluation 12/13/22    Authorization Type Medicare    Authorization Time Period 10/04/22-12/13/22    Authorization - Visit Number 9    Authorization - Number of Visits 20    Progress Note Due on Visit 10    PT Start Time 0930    PT Stop Time 1015    PT Time Calculation (min) 45 min    Equipment Utilized During Treatment Gait belt    Activity Tolerance Patient tolerated treatment well    Behavior During Therapy WFL for tasks assessed/performed              Past Medical History:  Diagnosis Date   Anemia    iron deficient   Arthritis    Autoimmune hepatitis ([place]) 05/10/2016   Hep C   B12 deficiency    Barrett esophagus 01/2016   Bradycardia    Chronic kidney disease    stage III   Diverticulosis    GERD (gastroesophageal reflux disease)    Hypertension    Hypothyroidism    Ulcer disease    Past Surgical History:  Procedure Laterality Date   CATARACT EXTRACTION W/PHACO Left 08/30/2021   Procedure: CATARACT EXTRACTION PHACO AND INTRAOCULAR LENS PLACEMENT ([place]) LEFT 8.21 01:00.3;  Surgeon: Leandrew Koyanagi, MD;  Location: [facility];  Service: Ophthalmology;  Laterality: Left;   CATARACT EXTRACTION W/PHACO Right 09/13/2021   Procedure: CATARACT EXTRACTION PHACO AND INTRAOCULAR LENS PLACEMENT ([place]) RIGHT;  Surgeon: Leandrew Koyanagi, MD;  Location: [facility];  Service: Ophthalmology;  Laterality: Right;  8.11 00:59.8   CHOLECYSTECTOMY N/A 06/25/2018   Procedure: LAPAROSCOPIC CHOLECYSTECTOMY WITH INTRAOPERATIVE CHOLANGIOGRAM;  Surgeon: Robert Bellow, MD;  Location: [facility];  Service: General;  Laterality: N/A;    ESOPHAGOGASTRODUODENOSCOPY (EGD) WITH PROPOFOL N/A 01/30/2016   Procedure: ESOPHAGOGASTRODUODENOSCOPY (EGD) WITH PROPOFOL;  Surgeon: Lollie Sails, MD;  Location: [facility] ENDOSCOPY;  Service: Endoscopy;  Laterality: N/A;   ESOPHAGOGASTRODUODENOSCOPY (EGD) WITH PROPOFOL N/A 01/14/2018   Procedure: ESOPHAGOGASTRODUODENOSCOPY (EGD) WITH PROPOFOL;  Surgeon: Lollie Sails, MD;  Location: [facility] ENDOSCOPY;  Service: Endoscopy;  Laterality: N/A;   ESOPHAGOGASTRODUODENOSCOPY (EGD) WITH PROPOFOL N/A 05/20/2018   Procedure: ESOPHAGOGASTRODUODENOSCOPY (EGD) WITH PROPOFOL;  Surgeon: Lollie Sails, MD;  Location: [facility] ENDOSCOPY;  Service: Endoscopy;  Laterality: N/A;   ESOPHAGOGASTRODUODENOSCOPY (EGD) WITH PROPOFOL N/A 01/13/2019   Procedure: ESOPHAGOGASTRODUODENOSCOPY (EGD) WITH PROPOFOL;  Surgeon: Lollie Sails, MD;  Location: [facility] ENDOSCOPY;  Service: Endoscopy;  Laterality: N/A;   HERNIA REPAIR     lap hiatal hernia repair  07/08/08   LEFT OOPHORECTOMY  1993   benign ovarian cyst   TUBAL LIGATION  1981   Patient Active Problem List   Diagnosis Date Noted   Bladder prolapse, female, acquired 05/06/2022   Hematoma 10/22/2021   Right knee pain 10/13/2021   SVT (supraventricular tachycardia) 10/07/2021   PSVT (paroxysmal supraventricular tachycardia) 10/06/2021   Numbness in feet 06/18/2021   CKD (chronic kidney disease) 06/12/2021   Dizziness 04/10/2021   Chest tightness 02/07/2021   Diarrhea 10/09/2020   Abdominal discomfort 09/27/2019   Gall bladder polyp 05/07/2018   Breast tenderness 05/07/2018  Hyperglycemia 09/10/2016   Fullness of breast 09/10/2016   Autoimmune hepatitis ([place]) 05/10/2016   MGUS (monoclonal gammopathy of unknown significance) 06/06/2015   LUQ fullness 06/06/2015   Health care maintenance 06/06/2015   Fatty tumor 03/07/2015   BMI 32.0-32.9,adult 01/02/2015   Abnormal liver function tests 01/02/2015   Hand cramps 11/15/2014   Leg cramps 06/13/2014    Osteopenia 03/02/2013   Hyperbilirubinemia 03/02/2013   Hypertension 12/22/2012   Gastroparesis 12/22/2012   Barrett's esophagus with esophagitis 12/22/2012   Hypothyroidism 12/22/2012   Anemia 12/22/2012   B12 deficiency 12/22/2012   CKD (chronic kidney disease) stage 3, GFR 30-59 ml/min ([facility]) 12/22/2012    REFERRING DIAG: M25.561 (ICD-10-CM) - Right knee pain, unspecified chronicity  THERAPY DIAG:  Muscle weakness (generalized)  Difficulty in walking, not elsewhere classified  Rationale for Evaluation and Treatment Rehabilitation  PERTINENT HISTORY: Pt was initially sent her by PCP for right knee pain from a patella fracture that she sustained back in February.  She occasionally feels pain in her left knee, but she does not identify this as a problem. Pt describes feeling like she is walking stiff where she is having to lift the right leg upstairs.  She feels afraid of falling and she walks slower as a result. She does not normally use a SPC with the exception of shopping in a grocery store.   PRECAUTIONS: Falls   SUBJECTIVE: Pt reports feeling better compared to last session and that her  PAIN:  Are you having pain? No   OBJECTIVE:                 BP 180/54 HR 58 SpO2 100      DIAGNOSTIC FINDINGS: CLINICAL DATA:  Right knee pain, fall   EXAM: RIGHT KNEE - 1-2 VIEW   COMPARISON:  None.   FINDINGS: Advanced tricompartment degenerative changes with joint space narrowing and spurring. Moderate joint effusion. No acute bony abnormality. Specifically, no fracture, subluxation, or dislocation.   IMPRESSION: No acute bony abnormality.  Degenerative changes and joint effusion.     Electronically Signed   By: Rolm Baptise M.D.   On: 10/16/2021 20:51   PATIENT SURVEYS:  FOTO 45/100 with target of 52    COGNITION:           Overall cognitive status: Within functional limits for tasks assessed                          SENSATION: WFL       MUSCLE  LENGTH: Hamstrings: Right 60 deg; Left 60 deg     POSTURE: rounded shoulders and forward head   PALPATION: TTP of anterior surface of bilateral knees    LOWER EXTREMITY ROM:     Active /PROM Right 10/04/2022 Left 10/04/2022  Hip flexion 120 120  Hip extension 30 30  Hip abduction 45 45  Hip adduction 30 30  Hip internal rotation 45 45  Hip external rotation 45 45  Knee flexion 110/120 110/120  Knee extension 0 0  Ankle dorsiflexion      Ankle plantarflexion      Ankle inversion      Ankle eversion       (Blank rows = not tested)        LOWER EXTREMITY MMT:   MMT Right eval Left eval  Hip flexion 4 4  Hip extension 4- 4-  Hip abduction 4- 4-  Hip adduction 3+ 3+  Hip internal rotation  Hip external rotation      Knee flexion 4 4  Knee extension 4 4  Ankle dorsiflexion      Ankle plantarflexion      Ankle inversion      Ankle eversion       (Blank rows = not tested)   LOWER EXTREMITY SPECIAL TESTS:  Hip special tests: Marcello Moores test: negative and Ely's test: positive    FUNCTIONAL TESTS:  5 times sit to stand: 19 sec  30 seconds chair stand test: 9 reps  Dynamic Gait Index: NT    GAIT: Distance walked: 10 meters  Assistive device utilized: None Level of assistance: Complete Independence Comments: Decreased step length on RLE        TODAY'S TREATMENT:  11/01/22: FOTO: 51/100 with target of 52  Sit to stand from 18 inch mat height 3 x 10  -min VC for fast concentric and slow eccentric  Step Up onto 12 inch stair with BUE support 1 x 5 -Pt unable to perform with assistance from PT  Step Up onto 12 inch stair with 4 inch step with BUE support 1 x 10 on RLE  Step Up onto 6 inch step with BUE support 1 x 10 on RLE   NMR  10 m x 4 with three 1 ft obstacles  -Needs to slow down to step over obstacle  10 m x 6 with 4 canes  10 m x 6 with verbal cues to change directions  10 m x 6 with verbal cues to change directions or before horizontal head  turns  10 m x 6 with verbal cues to change directions or before horizontal head turns and vertical head turns   10/30/22:            TM at 1.3 mph for 5 min             Seated HS Stretch with calf stretch 3 x 60 sec             5xSTS: >30 sec             30 sec: 2 reps             Sit to Stand from 20 inch mat height 2 x 8             Sit to Stand from 19 inch mat height 1 x 8              -min VC to train for power with fast stand and low sit             Step Up and Step Down using 1 UE  Step Up and Step Down using BUE support 3 x 10 on 4 inch step   10/25/22   TM at 1.3 mph for 5 min  Sit to stand with #5 DB 1 x 10  Sit to stand with #8 gallon water jug 2 x 10  Side step up onto 4 inch step with BUE support  2 x 10  Side step up onto 6 inch steps on stairs with BUE support 2 x 10  Long Arch Quad 5 lb AW on RLE 1 X 10  Mini-Squats with BUE support with butt taps on mat at 22 inches 2 x 10  -mod VC to shift weight posteriorly onto heels   10/23/22  NMR  Modified SLS on yoga block 4 x 30 sec   THEREX   Seated HS Stretch 2 x 30 sec  FOTO: 49/100 with target of 52 Sit to Stand from 18 inch mat height - 1 x 10  Sit to Stand from 18 inch mat height with #8 DB- 1 x 5  Sit to Stand from 18 inch mat height with #5 DB- 1 x 5            Standing HS Curls 3 x 10    10/18/22   There.ex: TM for warm up with BUE support. 5 min at 1.3 MPH. Discussion of HEP, balance deficits during warm up.  Standing Heel to toe raises: 3x12  SUE support standing ankle DF/LE: 3x12  Neuro Re-ed: CGA and gait belt utilized Gait along 25' pathway: X6 laps   Figure 8's for R/L turns: x6 laps   Obstacle navigation -->  3x 6" hurdles -->  ambulating over unstable surface: x6 laps. Appears difficulty with stance phase on RLE leading to decreased LLE foot clearance. Min ot mod VC's for hip flexion instead of compensatory circumduction.   SLS with contralateral LE resting on cone for support: 2x30 sec,  two finger support   Ambulating 100' circles in gym: 3 laps working on changes in gait speed and horizontal and vertical head turns. Given in random order to work on reaction time, dynamic balance.  Additional 3 laps adding cognitive dual task along with tasks above (naming kitchen utensils and jewelry stones). CGA. Some variable step lengths and slowed gait speed noted but no significant LOB.   Tandem stance no UE support: 2x30 sec/LE under BOS   Normal stance on foam: 1 minutes   Narrow stance on foam: 2x30 sec    PATIENT EDUCATION:  Education details: form and education for appropriate exercise and explanation of tests and measures  Person educated: Patient Education method: Consulting civil engineer, Demonstration, Verbal cues, and Handouts Education comprehension: verbalized understanding, returned demonstration, and verbal cues required     HOME EXERCISE PROGRAM: Access Code: [email or web address] URL: [email or web address] Date: 11/01/2022 Prepared by: Bradly Chris  Exercises - Sit to Stand  - 3 x weekly - 3 sets - 10 reps - Prone Quadriceps Stretch with Strap  - 1 x daily - 3 reps - 30-60 sec  hold - Standing March  - 3 x weekly - 3 sets - 10 reps - Single Leg Heel Raise with Counter Support  - 3 x weekly - 3 sets - 10 reps - Side Lunge Adductor Stretch  - 1 x daily - 3 reps - 30 hold - Half Tandem Stance Balance with Eyes Closed  - 1 x daily - 5 reps - 30 sec  hold - Seated Table Hamstring Stretch  - 1 x daily - 3 reps - 30-60 sec hold - Standing Knee Flexion AROM with Chair Support  - 3 x weekly - 3 sets - 10 reps - Lateral Step Up with Counter Support  - 3 x weekly - 3 sets - 10 reps - Step Up  - 3 x weekly - 3 sets - 10 reps - Sidelying Hip Adduction  - 3 x weekly - 3 sets - 10 reps ASSESSMENT:   CLINICAL IMPRESSION:  Pt shows improvement in dynamic balance with ability to perform obstacle step over with some decrease in gait speed but improvement in gait speed over course of  activity. She continues to show difficulty with RLE strength with need for BUE support and decreased step height to perform step up on RLE. She does show improvement in LE strength with ability to perform sit to stand at  18 inch mat height without BUE support and with ability to achieve 10 reps for each set. Pt will continue to benefit from skilled PT services to progress balance deficits to address remaining deficits.   OBJECTIVE IMPAIRMENTS Abnormal gait, decreased balance, decreased ROM, and decreased strength.    ACTIVITY LIMITATIONS squatting, stairs, and locomotion level   PARTICIPATION LIMITATIONS: shopping, community activity, and yard work   [place] Age and 3+ comorbidities: chronic kidney disease, HTN, and h/o right knee patella fracture   are also affecting patient's functional outcome.    REHAB POTENTIAL: Good   CLINICAL DECISION MAKING: Stable/uncomplicated   EVALUATION COMPLEXITY: Low     GOALS: Goals reviewed with patient? No   SHORT TERM GOALS: Target date: 10/18/2022  Pt will be independent with HEP in order to improve strength and balance in order to decrease fall risk and improve function at home and work. Baseline: Performing independently  Goal status: Ongoing    2.  Pt will decrease 5TSTS by at least 3 seconds in order to demonstrate clinically significant improvement in LE strength.  Baseline: 9 sec  Goal status: Ongoing    3.  Pt will increase 10MWT by at least 0.13 m/s in order to demonstrate clinically significant improvement in community ambulation. Baseline: 0.50 m/sec  Goal status: Ongoing        LONG TERM GOALS: Target date: 12/13/2022    Patient will have improved function and activity level as evidenced by an increase in FOTO score by 10 points or more. Baseline: 43/100 with target of 52  Goal status: Ongoing    2.  Patient will improve hip and knee strength by 1/3 MMT (I.e 4- to 4) for improved hip stability to improve steadiness of  gait to decrease risk of falling.  Baseline: Hip Adduction R/L 3+/3+, Hip Abduction R/L 4-/4-, Hip Ext R/L  4-/-, Knee Flex R/L 4/4, Knee Ext R/L4/4 Goal status: Ongoing    3.  Patient will demonstrate reduced falls risk as evidenced by Dynamic Gait Index (DGI) >19/24. Baseline:15/24 Goal status: Ongoing    4.  Patient will improve 5 x STS to 11 sec to demonstrate improved LE strength that places her at a decreased risk for falls. Baseline: 19 sec  Goal status: Ongoing    5.  Patient will demonstrate a gait speed < 1.0 m/sec to show that she capable of a gait speed that places her at a decreased risk for falls.  Baseline: 0.50 m/sec  Goal status: Ongoing    6.  Patient will perform 8>= sit to stands within 30 sec to show LE endurance that places her at a decreased risk for falls.  Baseline: 9 reps  Goal status: Ongoing      PLAN: PT FREQUENCY: 1-2x/week   PT DURATION: 10 weeks   PLANNED INTERVENTIONS: Therapeutic exercises, Neuromuscular re-education, Balance training, Gait training, Joint mobilization, Joint manipulation, Stair training, DME instructions, Aquatic Therapy, Dry Needling, Spinal manipulation, Spinal mobilization, Cryotherapy, Moist heat, Manual therapy, and Re-evaluation   PLAN FOR NEXT SESSION: Reassess goals.  Progress LE strengthening exercises to include increased step height and decreased mat height. Do dynamic balance exercises: tight rope walk, walking backwards, etc.    Bradly Chris PT, DPT  Physical Therapist- [facility] 11/01/2022, 9:29 AM

## 2022-11-02 ENCOUNTER — Other Ambulatory Visit: Payer: Self-pay | Admitting: Internal Medicine

## 2022-11-02 DIAGNOSIS — R252 Cramp and spasm: Secondary | ICD-10-CM

## 2022-11-02 NOTE — Telephone Encounter (Signed)
Rx sent and pt aware 

## 2022-11-06 ENCOUNTER — Encounter: Payer: Self-pay | Admitting: Physical Therapy

## 2022-11-06 ENCOUNTER — Ambulatory Visit: Payer: Medicare Other | Admitting: Physical Therapy

## 2022-11-06 DIAGNOSIS — R262 Difficulty in walking, not elsewhere classified: Secondary | ICD-10-CM | POA: Diagnosis not present

## 2022-11-06 DIAGNOSIS — M6281 Muscle weakness (generalized): Secondary | ICD-10-CM | POA: Diagnosis not present

## 2022-11-06 NOTE — Therapy (Signed)
OUTPATIENT PHYSICAL THERAPY PROGRESS NOTE   Patient Name: Lori [name] MRN: [id number] DOB:April 11, 1934, 88 y.o., female Today's Date: 11/06/2022  PCP: Dr. Einar Pheasant  REFERRING PROVIDER: Dr. Einar Pheasant   END OF SESSION:   PT End of Session - 11/06/22 1153     Visit Number 10    Number of Visits 20    Date for PT Re-Evaluation 12/13/22    Authorization Type Medicare    Authorization Time Period 10/04/22-12/13/22    Authorization - Visit Number 10    Authorization - Number of Visits 20    Progress Note Due on Visit 10    PT Start Time 6144    PT Stop Time 1230    PT Time Calculation (min) 45 min    Equipment Utilized During Treatment Gait belt    Activity Tolerance Patient tolerated treatment well    Behavior During Therapy WFL for tasks assessed/performed              Past Medical History:  Diagnosis Date   Anemia    iron deficient   Arthritis    Autoimmune hepatitis ([place]) 05/10/2016   Hep C   B12 deficiency    Barrett esophagus 01/2016   Bradycardia    Chronic kidney disease    stage III   Diverticulosis    GERD (gastroesophageal reflux disease)    Hypertension    Hypothyroidism    Ulcer disease    Past Surgical History:  Procedure Laterality Date   CATARACT EXTRACTION W/PHACO Left 08/30/2021   Procedure: CATARACT EXTRACTION PHACO AND INTRAOCULAR LENS PLACEMENT ([place]) LEFT 8.21 01:00.3;  Surgeon: Leandrew Koyanagi, MD;  Location: [place];  Service: Ophthalmology;  Laterality: Left;   CATARACT EXTRACTION W/PHACO Right 09/13/2021   Procedure: CATARACT EXTRACTION PHACO AND INTRAOCULAR LENS PLACEMENT ([place]) RIGHT;  Surgeon: Leandrew Koyanagi, MD;  Location: [place] [place];  Service: Ophthalmology;  Laterality: Right;  8.11 00:59.8   CHOLECYSTECTOMY N/A 06/25/2018   Procedure: LAPAROSCOPIC CHOLECYSTECTOMY WITH INTRAOPERATIVE CHOLANGIOGRAM;  Surgeon: Robert Bellow, MD;  Location: [facility];  Service: General;  Laterality:  N/A;   ESOPHAGOGASTRODUODENOSCOPY (EGD) WITH PROPOFOL N/A 01/30/2016   Procedure: ESOPHAGOGASTRODUODENOSCOPY (EGD) WITH PROPOFOL;  Surgeon: Lollie Sails, MD;  Location: [facility] ENDOSCOPY;  Service: Endoscopy;  Laterality: N/A;   ESOPHAGOGASTRODUODENOSCOPY (EGD) WITH PROPOFOL N/A 01/14/2018   Procedure: ESOPHAGOGASTRODUODENOSCOPY (EGD) WITH PROPOFOL;  Surgeon: Lollie Sails, MD;  Location: [facility] ENDOSCOPY;  Service: Endoscopy;  Laterality: N/A;   ESOPHAGOGASTRODUODENOSCOPY (EGD) WITH PROPOFOL N/A 05/20/2018   Procedure: ESOPHAGOGASTRODUODENOSCOPY (EGD) WITH PROPOFOL;  Surgeon: Lollie Sails, MD;  Location: [facility] ENDOSCOPY;  Service: Endoscopy;  Laterality: N/A;   ESOPHAGOGASTRODUODENOSCOPY (EGD) WITH PROPOFOL N/A 01/13/2019   Procedure: ESOPHAGOGASTRODUODENOSCOPY (EGD) WITH PROPOFOL;  Surgeon: Lollie Sails, MD;  Location: [facility] ENDOSCOPY;  Service: Endoscopy;  Laterality: N/A;   HERNIA REPAIR     lap hiatal hernia repair  07/08/08   LEFT OOPHORECTOMY  1993   benign ovarian cyst   TUBAL LIGATION  1981   Patient Active Problem List   Diagnosis Date Noted   Bladder prolapse, female, acquired 05/06/2022   Hematoma 10/22/2021   Right knee pain 10/13/2021   SVT (supraventricular tachycardia) 10/07/2021   PSVT (paroxysmal supraventricular tachycardia) 10/06/2021   Numbness in feet 06/18/2021   CKD (chronic kidney disease) 06/12/2021   Dizziness 04/10/2021   Chest tightness 02/07/2021   Diarrhea 10/09/2020   Abdominal discomfort 09/27/2019   Gall bladder polyp 05/07/2018   Breast tenderness 05/07/2018  Hyperglycemia 09/10/2016   Fullness of breast 09/10/2016   Autoimmune hepatitis ([place]) 05/10/2016   MGUS (monoclonal gammopathy of unknown significance) 06/06/2015   LUQ fullness 06/06/2015   Health care maintenance 06/06/2015   Fatty tumor 03/07/2015   BMI 32.0-32.9,adult 01/02/2015   Abnormal liver function tests 01/02/2015   Hand cramps 11/15/2014   Leg cramps 06/13/2014    Osteopenia 03/02/2013   Hyperbilirubinemia 03/02/2013   Hypertension 12/22/2012   Gastroparesis 12/22/2012   Barrett's esophagus with esophagitis 12/22/2012   Hypothyroidism 12/22/2012   Anemia 12/22/2012   B12 deficiency 12/22/2012   CKD (chronic kidney disease) stage 3, GFR 30-59 ml/min ([facility]) 12/22/2012    REFERRING DIAG: M25.561 (ICD-10-CM) - Right knee pain, unspecified chronicity  THERAPY DIAG:  Muscle weakness (generalized)  Difficulty in walking, not elsewhere classified  Rationale for Evaluation and Treatment Rehabilitation  PERTINENT HISTORY: Pt was initially sent her by PCP for right knee pain from a patella fracture that she sustained back in February.  She occasionally feels pain in her left knee, but she does not identify this as a problem. Pt describes feeling like she is walking stiff where she is having to lift the right leg upstairs.  She feels afraid of falling and she walks slower as a result. She does not normally use a SPC with the exception of shopping in a grocery store.   PRECAUTIONS: Falls   SUBJECTIVE: Pt reports that she feels that her balance is improving but she still needs to hold onto shopping cart when shopping in grocery store especially in crowds.  PAIN:  Are you having pain? No   OBJECTIVE:                 BP 180/54 HR 58 SpO2 100      DIAGNOSTIC FINDINGS: CLINICAL DATA:  Right knee pain, fall   EXAM: RIGHT KNEE - 1-2 VIEW   COMPARISON:  None.   FINDINGS: Advanced tricompartment degenerative changes with joint space narrowing and spurring. Moderate joint effusion. No acute bony abnormality. Specifically, no fracture, subluxation, or dislocation.   IMPRESSION: No acute bony abnormality.  Degenerative changes and joint effusion.     Electronically Signed   By: Rolm Baptise M.D.   On: 10/16/2021 20:51   PATIENT SURVEYS:  FOTO 45/100 with target of 52    COGNITION:           Overall cognitive status: Within functional limits for  tasks assessed                          SENSATION: WFL       MUSCLE LENGTH: Hamstrings: Right 60 deg; Left 60 deg     POSTURE: rounded shoulders and forward head   PALPATION: TTP of anterior surface of bilateral knees    LOWER EXTREMITY ROM:     Active /PROM Right 10/04/2022 Left 10/04/2022  Hip flexion 120 120  Hip extension 30 30  Hip abduction 45 45  Hip adduction 30 30  Hip internal rotation 45 45  Hip external rotation 45 45  Knee flexion 110/120 110/120  Knee extension 0 0  Ankle dorsiflexion      Ankle plantarflexion      Ankle inversion      Ankle eversion       (Blank rows = not tested)        LOWER EXTREMITY MMT:   MMT Right eval Left eval  Hip flexion 4 4  Hip extension  4- 4-  Hip abduction 4- 4-  Hip adduction 3+ 3+  Hip internal rotation      Hip external rotation      Knee flexion 4 4  Knee extension 4 4  Ankle dorsiflexion      Ankle plantarflexion      Ankle inversion      Ankle eversion       (Blank rows = not tested)   LOWER EXTREMITY SPECIAL TESTS:  Hip special tests: Marcello Moores test: negative and Ely's test: positive    FUNCTIONAL TESTS:  5 times sit to stand: 19 sec  30 seconds chair stand test: 9 reps  Dynamic Gait Index: NT    GAIT: Distance walked: 10 meters  Assistive device utilized: None Level of assistance: Complete Independence Comments: Decreased step length on RLE        TODAY'S TREATMENT:  11/06/22: TM at 1.5 mph for 5 min and with BUE support  7mT: 10 m/ 15.35 sec =0.65 m/sec  DGI: 19/24  -mild impairment with obstacles, turn and stop, and fast and slow  -moderate impairment with stairs   MMT: Knee Ext R/L4/4 11/06/22: Hip Adduction R/L 4-/4-, Hip Abduction R/L 4/4, Hip Ext R/L  4/4, Knee Flex R/L 5/5, Knee Ext R/L 5/5  5xSTS: 26 sec  30 sec chair stands: 6 reps  Standing Marches 2 x 10 with 1 finger support    11/01/22: FOTO: 51/100 with target of 52  Sit to stand from 18 inch mat height 3 x 10   -min VC for fast concentric and slow eccentric  Step Up onto 12 inch stair with BUE support 1 x 5 -Pt unable to perform with assistance from PT  Step Up onto 12 inch stair with 4 inch step with BUE support 1 x 10 on RLE  Step Up onto 6 inch step with BUE support 1 x 10 on RLE   NMR  10 m x 4 with three 1 ft obstacles  -Needs to slow down to step over obstacle  10 m x 6 with 4 canes  10 m x 6 with verbal cues to change directions  10 m x 6 with verbal cues to change directions or before horizontal head turns  10 m x 6 with verbal cues to change directions or before horizontal head turns and vertical head turns   10/30/22:            TM at 1.3 mph for 5 min             Seated HS Stretch with calf stretch 3 x 60 sec             5xSTS: >30 sec             30 sec: 2 reps             Sit to Stand from 20 inch mat height 2 x 8             Sit to Stand from 19 inch mat height 1 x 8              -min VC to train for power with fast stand and low sit             Step Up and Step Down using 1 UE  Step Up and Step Down using BUE support 3 x 10 on 4 inch step   10/25/22   TM at 1.3 mph for 5 min  Sit to stand  with #5 DB 1 x 10  Sit to stand with #8 gallon water jug 2 x 10  Side step up onto 4 inch step with BUE support  2 x 10  Side step up onto 6 inch steps on stairs with BUE support 2 x 10  Long Arch Quad 5 lb AW on RLE 1 X 10  Mini-Squats with BUE support with butt taps on mat at 22 inches 2 x 10  -mod VC to shift weight posteriorly onto heels   10/23/22  NMR  Modified SLS on yoga block 4 x 30 sec   THEREX   Seated HS Stretch 2 x 30 sec  FOTO: 49/100 with target of 52 Sit to Stand from 18 inch mat height - 1 x 10  Sit to Stand from 18 inch mat height with #8 DB- 1 x 5  Sit to Stand from 18 inch mat height with #5 DB- 1 x 5            Standing HS Curls 3 x 10     PATIENT EDUCATION:  Education details: form and education for appropriate exercise and explanation of tests and  measures  Person educated: Patient Education method: Consulting civil engineer, Demonstration, Verbal cues, and Handouts Education comprehension: verbalized understanding, returned demonstration, and verbal cues required     HOME EXERCISE PROGRAM: Access Code: [email or web address] URL: [email or web address] Date: 11/01/2022 Prepared by: Bradly Chris  Exercises - Sit to Stand  - 3 x weekly - 3 sets - 10 reps - Prone Quadriceps Stretch with Strap  - 1 x daily - 3 reps - 30-60 sec  hold - Standing March  - 3 x weekly - 3 sets - 10 reps - Single Leg Heel Raise with Counter Support  - 3 x weekly - 3 sets - 10 reps - Side Lunge Adductor Stretch  - 1 x daily - 3 reps - 30 hold - Half Tandem Stance Balance with Eyes Closed  - 1 x daily - 5 reps - 30 sec  hold - Seated Table Hamstring Stretch  - 1 x daily - 3 reps - 30-60 sec hold - Standing Knee Flexion AROM with Chair Support  - 3 x weekly - 3 sets - 10 reps - Lateral Step Up with Counter Support  - 3 x weekly - 3 sets - 10 reps - Step Up  - 3 x weekly - 3 sets - 10 reps - Sidelying Hip Adduction  - 3 x weekly - 3 sets - 10 reps ASSESSMENT:   CLINICAL IMPRESSION:  Pt presents to 10th apt for progress note. She shows an improvement in dynamic balance and self-perception of balance. However, she continues to show LE strength and endurance deficits that have worsened from her baseline. She does show an improvement in LE strength from a MMT perspective with improvement in joint specific strength.  It is unclear why her LE strength is not improving given that she states she is completing her HEP and she has not had any recent change in medical status, nutrition, or amount of sleep. PT recommends 5 visit addition to patient's plan of care to continue to work on strengthening LE to improve balance and mobility to reduce falls and have pt continue to maintain independence. Pt is in agreement with this plan.   OBJECTIVE IMPAIRMENTS Abnormal gait, decreased  balance, decreased ROM, and decreased strength.    ACTIVITY LIMITATIONS squatting, stairs, and locomotion level   PARTICIPATION LIMITATIONS: shopping, community activity, and yard work  PERSONAL FACTORS Age and 3+ comorbidities: chronic kidney disease, HTN, and h/o right knee patella fracture   are also affecting patient's functional outcome.    REHAB POTENTIAL: Good   CLINICAL DECISION MAKING: Stable/uncomplicated   EVALUATION COMPLEXITY: Low     GOALS: Goals reviewed with patient? No   SHORT TERM GOALS: Target date: 10/18/2022  Pt will be independent with HEP in order to improve strength and balance in order to decrease fall risk and improve function at home and work. Baseline: Performing independently  Goal status: Ongoing    2.  Pt will decrease 5TSTS by at least 3 seconds in order to demonstrate clinically significant improvement in LE strength.  Baseline: 9 sec  Goal status: Ongoing    3.  Pt will increase 10MWT by at least 0.13 m/s in order to demonstrate clinically significant improvement in community ambulation. Baseline: 0.50 m/sec  11/06/22: 0.65 m/sec  Goal status: Achieved        LONG TERM GOALS: Target date: 12/13/2022    Patient will have improved function and activity level as evidenced by an increase in FOTO score by 10 points or more. Baseline: 43/100 with target of 52  Goal status: Ongoing    2.  Patient will improve hip and knee strength by 1/3 MMT (I.e 4- to 4) for improved hip stability to improve steadiness of gait to decrease risk of falling.  Baseline: Hip Adduction R/L 3+/3+, Hip Abduction R/L 4-/4-, Hip Ext R/L  4-/-, Knee Flex R/L 4/4, Knee Ext R/L4/4 11/06/22: Hip Adduction R/L 4-/4-, Hip Abduction R/L 4/4, Hip Ext R/L  4/4, Knee Flex R/L 5/5, Knee Ext R/L 5/5 Goal status: Achieved    3.  Patient will demonstrate reduced falls risk as evidenced by Dynamic Gait Index (DGI) >19/24. Baseline:15/24  11/06/22: 19/24 Goal status: Partially Met    4.   Patient will improve 5 x STS to 11 sec to demonstrate improved LE strength that places her at a decreased risk for falls. Baseline: 19 sec   11/06/22: 24 sec  Goal status: Ongoing    5.  Patient will demonstrate a gait speed > 1.0 m/sec to show that she capable of a gait speed that places her at a decreased risk for falls.  Baseline: 0.50 m/sec 11/06/22: 0.65 m/sec  Goal status: Partially met    6.  Patient will perform 8>= sit to stands within 30 sec to show LE endurance that places her at a decreased risk for falls.  Baseline: 9 reps   11/06/22: 6 reps  Goal status: Partially met      PLAN: PT FREQUENCY: 1-2x/week   PT DURATION: 10 weeks   PLANNED INTERVENTIONS: Therapeutic exercises, Neuromuscular re-education, Balance training, Gait training, Joint mobilization, Joint manipulation, Stair training, DME instructions, Aquatic Therapy, Dry Needling, Spinal manipulation, Spinal mobilization, Cryotherapy, Moist heat, Manual therapy, and Re-evaluation   PLAN FOR NEXT SESSION: Reassess goals.  Progress LE strengthening exercises to include increased step height and decreased mat height. Do dynamic balance exercises: tight rope walk, walking backwards, etc.    Bradly Chris PT, DPT  Physical Therapist- [facility] 11/06/2022, 11:54 AM

## 2022-11-08 ENCOUNTER — Encounter: Payer: Self-pay | Admitting: Physical Therapy

## 2022-11-08 ENCOUNTER — Ambulatory Visit: Payer: Medicare Other | Admitting: Physical Therapy

## 2022-11-08 DIAGNOSIS — R262 Difficulty in walking, not elsewhere classified: Secondary | ICD-10-CM

## 2022-11-08 DIAGNOSIS — M6281 Muscle weakness (generalized): Secondary | ICD-10-CM

## 2022-11-08 NOTE — Therapy (Addendum)
OUTPATIENT PHYSICAL THERAPY TREATMENT NOTE   Patient Name: Lori [name] MRN: [id number] DOB:[birth year], 88 y.o., female Today's Date: 11/20/2022  PCP: Dr. Einar Pheasant  REFERRING PROVIDER: Dr. Einar Pheasant   END OF SESSION:      Past Medical History:  Diagnosis Date   Anemia    iron deficient   Arthritis    Autoimmune hepatitis ([place]) 05/10/2016   Hep C   B12 deficiency    Barrett esophagus 01/2016   Bradycardia    Chronic kidney disease    stage III   Diverticulosis    GERD (gastroesophageal reflux disease)    Hypertension    Hypothyroidism    Ulcer disease    Past Surgical History:  Procedure Laterality Date   CATARACT EXTRACTION W/PHACO Left 08/30/2021   Procedure: CATARACT EXTRACTION PHACO AND INTRAOCULAR LENS PLACEMENT ([place]) LEFT 8.21 01:00.3;  Surgeon: Leandrew Koyanagi, MD;  Location: [place];  Service: Ophthalmology;  Laterality: Left;   CATARACT EXTRACTION W/PHACO Right 09/13/2021   Procedure: CATARACT EXTRACTION PHACO AND INTRAOCULAR LENS PLACEMENT ([place]) RIGHT;  Surgeon: Leandrew Koyanagi, MD;  Location: [place];  Service: Ophthalmology;  Laterality: Right;  8.11 00:59.8   CHOLECYSTECTOMY N/A 06/25/2018   Procedure: LAPAROSCOPIC CHOLECYSTECTOMY WITH INTRAOPERATIVE CHOLANGIOGRAM;  Surgeon: Robert Bellow, MD;  Location: [facility];  Service: General;  Laterality: N/A;   ESOPHAGOGASTRODUODENOSCOPY (EGD) WITH PROPOFOL N/A 01/30/2016   Procedure: ESOPHAGOGASTRODUODENOSCOPY (EGD) WITH PROPOFOL;  Surgeon: Lollie Sails, MD;  Location: [facility];  Service: Endoscopy;  Laterality: N/A;   ESOPHAGOGASTRODUODENOSCOPY (EGD) WITH PROPOFOL N/A 01/14/2018   Procedure: ESOPHAGOGASTRODUODENOSCOPY (EGD) WITH PROPOFOL;  Surgeon: Lollie Sails, MD;  Location: [facility];  Service: Endoscopy;  Laterality: N/A;   ESOPHAGOGASTRODUODENOSCOPY (EGD) WITH PROPOFOL N/A 05/20/2018   Procedure: ESOPHAGOGASTRODUODENOSCOPY (EGD) WITH  PROPOFOL;  Surgeon: Lollie Sails, MD;  Location: [facility] ENDOSCOPY;  Service: Endoscopy;  Laterality: N/A;   ESOPHAGOGASTRODUODENOSCOPY (EGD) WITH PROPOFOL N/A 01/13/2019   Procedure: ESOPHAGOGASTRODUODENOSCOPY (EGD) WITH PROPOFOL;  Surgeon: Lollie Sails, MD;  Location: [facility] ENDOSCOPY;  Service: Endoscopy;  Laterality: N/A;   HERNIA REPAIR     lap hiatal hernia repair  07/08/08   LEFT OOPHORECTOMY  1993   benign ovarian cyst   TUBAL LIGATION  1981   Patient Active Problem List   Diagnosis Date Noted   Bladder prolapse, female, acquired 05/06/2022   Hematoma 10/22/2021   Right knee pain 10/13/2021   SVT (supraventricular tachycardia) 10/07/2021   PSVT (paroxysmal supraventricular tachycardia) 10/06/2021   Numbness in feet 06/18/2021   CKD (chronic kidney disease) 06/12/2021   Dizziness 04/10/2021   Chest tightness 02/07/2021   Diarrhea 10/09/2020   Abdominal discomfort 09/27/2019   Gall bladder polyp 05/07/2018   Breast tenderness 05/07/2018   Hyperglycemia 09/10/2016   Fullness of breast 09/10/2016   Autoimmune hepatitis ([place]) 05/10/2016   MGUS (monoclonal gammopathy of unknown significance) 06/06/2015   LUQ fullness 06/06/2015   Health care maintenance 06/06/2015   Fatty tumor 03/07/2015   BMI 32.0-32.9,adult 01/02/2015   Abnormal liver function tests 01/02/2015   Hand cramps 11/15/2014   Leg cramps 06/13/2014   Osteopenia 03/02/2013   Hyperbilirubinemia 03/02/2013   Hypertension 12/22/2012   Gastroparesis 12/22/2012   Barrett's esophagus with esophagitis 12/22/2012   Hypothyroidism 12/22/2012   Anemia 12/22/2012   B12 deficiency 12/22/2012   CKD (chronic kidney disease) stage 3, GFR 30-59 ml/min (HCC) 12/22/2012    REFERRING DIAG: M25.561 (ICD-10-CM) - Right knee pain, unspecified chronicity  THERAPY DIAG:  Muscle  weakness (generalized)  Difficulty in walking, not elsewhere classified  Rationale for Evaluation and Treatment Rehabilitation  PERTINENT  HISTORY: Pt was initially sent her by PCP for right knee pain from a patella fracture that she sustained back in February.  She occasionally feels pain in her left knee, but she does not identify this as a problem. Pt describes feeling like she is walking stiff where she is having to lift the right leg upstairs.  She feels afraid of falling and she walks slower as a result. She does not normally use a SPC with the exception of shopping in a grocery store.   PRECAUTIONS: Falls   SUBJECTIVE: Pt reports that exercises are going well and she is not experiencing any pain from exercises.   PAIN:  Are you having pain? No   OBJECTIVE:                 BP 180/54 HR 58 SpO2 100      DIAGNOSTIC FINDINGS: CLINICAL DATA:  Right knee pain, fall   EXAM: RIGHT KNEE - 1-2 VIEW   COMPARISON:  None.   FINDINGS: Advanced tricompartment degenerative changes with joint space narrowing and spurring. Moderate joint effusion. No acute bony abnormality. Specifically, no fracture, subluxation, or dislocation.   IMPRESSION: No acute bony abnormality.  Degenerative changes and joint effusion.     Electronically Signed   By: Rolm Baptise M.D.   On: 10/16/2021 20:51   PATIENT SURVEYS:  FOTO 45/100 with target of 52    COGNITION:           Overall cognitive status: Within functional limits for tasks assessed                          SENSATION: WFL       MUSCLE LENGTH: Hamstrings: Right 60 deg; Left 60 deg     POSTURE: rounded shoulders and forward head   PALPATION: TTP of anterior surface of bilateral knees    LOWER EXTREMITY ROM:     Active /PROM Right 10/04/2022 Left 10/04/2022  Hip flexion 120 120  Hip extension 30 30  Hip abduction 45 45  Hip adduction 30 30  Hip internal rotation 45 45  Hip external rotation 45 45  Knee flexion 110/120 110/120  Knee extension 0 0  Ankle dorsiflexion      Ankle plantarflexion      Ankle inversion      Ankle eversion       (Blank rows = not  tested)        LOWER EXTREMITY MMT:   MMT Right eval Left eval  Hip flexion 4 4  Hip extension 4- 4-  Hip abduction 4- 4-  Hip adduction 3+ 3+  Hip internal rotation      Hip external rotation      Knee flexion 4 4  Knee extension 4 4  Ankle dorsiflexion      Ankle plantarflexion      Ankle inversion      Ankle eversion       (Blank rows = not tested)   LOWER EXTREMITY SPECIAL TESTS:  Hip special tests: Marcello Moores test: negative and Ely's test: positive    FUNCTIONAL TESTS:  5 times sit to stand: 19 sec  30 seconds chair stand test: 9 reps  Dynamic Gait Index: NT    GAIT: Distance walked: 10 meters  Assistive device utilized: None Level of assistance: Complete Independence Comments: Decreased step length  on RLE        TODAY'S TREATMENT:  11/08/22: TM at 1.3 mph for 5 min with [place] Leg Press #25 1 x 10   OMEGA Leg Press #35 1 x 10  OMEGA Leg Press #45 1 x 10   OMEGA  HS Curls #15 3 x 10  OMEGA HS Curls #20 1 x 5  -Pt unable to perform without compensation    OMEGA Knee Ext #10 3 x 10  OMEGA Knee Ext #15 1 x 5  -Pt unable to perform without compensation   [place] Quad #5 AW 2 x 10  [place] Quad #7.5 AW 2 x 10  [place] Quad #10 AW 2 x 10   Seated [employer] with Black TB on RLE 1 x 10  -Pt able to perform independently  11/06/22: TM at 1.5 mph for 5 min and with BUE support  67mT: 10 m/ 15.35 sec =0.65 m/sec  DGI: 19/24  -mild impairment with obstacles, turn and stop, and fast and slow  -moderate impairment with stairs   MMT: Knee Ext R/L4/4 11/06/22: Hip Adduction R/L 4-/4-, Hip Abduction R/L 4/4, Hip Ext R/L  4/4, Knee Flex R/L 5/5, Knee Ext R/L 5/5  5xSTS: 26 sec  30 sec chair stands: 6 reps  Standing Marches 2 x 10 with 1 finger support    11/01/22: FOTO: 51/100 with target of 52  Sit to stand from 18 inch mat height 3 x 10  -min VC for fast concentric and slow eccentric  Step Up onto 12 inch stair with BUE support 1 x  5 -Pt unable to perform with assistance from PT  Step Up onto 12 inch stair with 4 inch step with BUE support 1 x 10 on RLE  Step Up onto 6 inch step with BUE support 1 x 10 on RLE   NMR  10 m x 4 with three 1 ft obstacles  -Needs to slow down to step over obstacle  10 m x 6 with 4 canes  10 m x 6 with verbal cues to change directions  10 m x 6 with verbal cues to change directions or before horizontal head turns  10 m x 6 with verbal cues to change directions or before horizontal head turns and vertical head turns   10/30/22:            TM at 1.3 mph for 5 min             Seated HS Stretch with calf stretch 3 x 60 sec             5xSTS: >30 sec             30 sec: 2 reps             Sit to Stand from 20 inch mat height 2 x 8             Sit to Stand from 19 inch mat height 1 x 8              -min VC to train for power with fast stand and low sit             Step Up and Step Down using 1 UE  Step Up and Step Down using BUE support 3 x 10 on 4 inch step       PATIENT EDUCATION:  Education details: form and education for appropriate exercise and explanation  of tests and measures  Person educated: Patient Education method: Explanation, Demonstration, Verbal cues, and Handouts Education comprehension: verbalized understanding, returned demonstration, and verbal cues required     HOME EXERCISE PROGRAM: Access Code: [email or web address] URL: [email or web address] Date: 11/08/2022 Prepared by: Bradly Chris  Exercises - Sit to Stand  - 3 x weekly - 3 sets - 10 reps - Prone Quadriceps Stretch with Strap  - 1 x daily - 3 reps - 30-60 sec  hold - Standing March  - 3 x weekly - 3 sets - 10 reps - Single Leg Heel Raise with Counter Support  - 3 x weekly - 3 sets - 10 reps - Side Lunge Adductor Stretch  - 1 x daily - 3 reps - 30 hold - Seated Table Hamstring Stretch  - 1 x daily - 3 reps - 30-60 sec hold - Lateral Step Up with Counter Support  - 3 x weekly - 3 sets - 10 reps -  Step Up  - 3 x weekly - 3 sets - 10 reps - Sidelying Hip Adduction  - 3 x weekly - 3 sets - 10 reps - Sitting Knee Extension with Resistance  - 3 x weekly - 3 sets - 10 reps     ASSESSMENT:   CLINICAL IMPRESSION:  Pt continues to show deficts with RLE step negotiation. Based on today's session, this is likely due to weakness in RLE quad and bilateral glutes and hamstrings. Session focused primarily on strengthening quad with exercise that pt could perform at home. She does not possess ankle weights so used black TB matched to highest level of ankle weight she was able to perform without compensation, which was 7.5 lbs. Next session will continue to focus on LE extensors to achieve step up on RLE. She will continue to benefit from skilled PT to improve LE strength and dynamic balance to walk without unsteadiness and to improve mobility.     OBJECTIVE IMPAIRMENTS Abnormal gait, decreased balance, decreased ROM, and decreased strength.    ACTIVITY LIMITATIONS squatting, stairs, and locomotion level   PARTICIPATION LIMITATIONS: shopping, community activity, and yard work   [place] Age and 3+ comorbidities: chronic kidney disease, HTN, and h/o right knee patella fracture   are also affecting patient's functional outcome.    REHAB POTENTIAL: Good   CLINICAL DECISION MAKING: Stable/uncomplicated   EVALUATION COMPLEXITY: Low     GOALS: Goals reviewed with patient? No   SHORT TERM GOALS: Target date: 10/18/2022  Pt will be independent with HEP in order to improve strength and balance in order to decrease fall risk and improve function at home and work. Baseline: Performing independently  Goal status: Ongoing    2.  Pt will decrease 5TSTS by at least 3 seconds in order to demonstrate clinically significant improvement in LE strength.  Baseline: 9 sec  Goal status: Ongoing    3.  Pt will increase 10MWT by at least 0.13 m/s in order to demonstrate clinically significant  improvement in community ambulation. Baseline: 0.50 m/sec  11/06/22: 0.65 m/sec  Goal status: Achieved        LONG TERM GOALS: Target date: 12/13/2022    Patient will have improved function and activity level as evidenced by an increase in FOTO score by 10 points or more. Baseline: 43/100 with target of 52  Goal status: Ongoing    2.  Patient will improve hip and knee strength by 1/3 MMT (I.e 4- to 4) for improved hip stability to improve steadiness of  gait to decrease risk of falling.  Baseline: Hip Adduction R/L 3+/3+, Hip Abduction R/L 4-/4-, Hip Ext R/L  4-/-, Knee Flex R/L 4/4, Knee Ext R/L4/4 11/06/22: Hip Adduction R/L 4-/4-, Hip Abduction R/L 4/4, Hip Ext R/L  4/4, Knee Flex R/L 5/5, Knee Ext R/L 5/5 Goal status: Achieved    3.  Patient will demonstrate reduced falls risk as evidenced by Dynamic Gait Index (DGI) >19/24. Baseline:15/24  11/06/22: 19/24 Goal status: Partially Met     4.  Patient will improve 5 x STS to 11 sec to demonstrate improved LE strength that places her at a decreased risk for falls. Baseline: 19 sec   11/06/22: 24 sec  Goal status: Ongoing    5.  Patient will demonstrate a gait speed > 1.0 m/sec to show that she capable of a gait speed that places her at a decreased risk for falls.  Baseline: 0.50 m/sec 11/06/22: 0.65 m/sec  Goal status: Partially met    6.  Patient will perform 8>= sit to stands within 30 sec to show LE endurance that places her at a decreased risk for falls.  Baseline: 9 reps   11/06/22: 6 reps  Goal status: Partially met      PLAN: PT FREQUENCY: 1-2x/week   PT DURATION: 10 weeks   PLANNED INTERVENTIONS: Therapeutic exercises, Neuromuscular re-education, Balance training, Gait training, Joint mobilization, Joint manipulation, Stair training, DME instructions, Aquatic Therapy, Dry Needling, Spinal manipulation, Spinal mobilization, Cryotherapy, Moist heat, Manual therapy, and Re-evaluation   PLAN FOR NEXT SESSION:  Progress LE  strengthening exercises to include increased step height and decreased mat height. Do dynamic balance exercises: tight rope walk, walking backwards, etc.    Bradly Chris PT, DPT  Physical Therapist- [facility] 11/20/2022, 1:34 PM

## 2022-11-12 ENCOUNTER — Ambulatory Visit (INDEPENDENT_AMBULATORY_CARE_PROVIDER_SITE_OTHER): Payer: Medicare Other

## 2022-11-12 DIAGNOSIS — E538 Deficiency of other specified B group vitamins: Secondary | ICD-10-CM

## 2022-11-12 MED ORDER — CYANOCOBALAMIN 1000 MCG/ML IJ SOLN
1000.0000 ug | Freq: Once | INTRAMUSCULAR | Status: AC
  Administered 2022-11-12: 1000 ug via INTRAMUSCULAR

## 2022-11-12 NOTE — Progress Notes (Signed)
Pt arrived for B12 injection, given in L deltoid. Pt tolerated injection well, showed no signs of distress nor voiced any concerns.  ?

## 2022-11-14 ENCOUNTER — Ambulatory Visit: Payer: Medicare Other | Admitting: Physical Therapy

## 2022-11-20 ENCOUNTER — Ambulatory Visit: Payer: Medicare Other | Admitting: Physical Therapy

## 2022-11-20 DIAGNOSIS — M6281 Muscle weakness (generalized): Secondary | ICD-10-CM | POA: Diagnosis not present

## 2022-11-20 DIAGNOSIS — R262 Difficulty in walking, not elsewhere classified: Secondary | ICD-10-CM

## 2022-11-20 NOTE — Therapy (Addendum)
OUTPATIENT PHYSICAL THERAPY TREATMENT NOTE   Patient Name: Lori [name] MRN: [id number] DOB:06-18-1934, 88 y.o., female Today's Date: 11/20/2022  PCP: Dr. Einar Pheasant  REFERRING PROVIDER: Dr. Einar Pheasant   END OF SESSION:   PT End of Session - 11/20/22 1147     Visit Number 12    Number of Visits 20    Date for PT Re-Evaluation 12/13/22    Authorization Type Medicare    Authorization Time Period 10/04/22-12/13/22    Authorization - Visit Number 12    Authorization - Number of Visits 20    Progress Note Due on Visit 20    PT Start Time 1145    PT Stop Time 1230    PT Time Calculation (min) 45 min    Equipment Utilized During Treatment Gait belt    Activity Tolerance Patient tolerated treatment well    Behavior During Therapy WFL for tasks assessed/performed              Past Medical History:  Diagnosis Date   Anemia    iron deficient   Arthritis    Autoimmune hepatitis ([place]) 05/10/2016   Hep C   B12 deficiency    Barrett esophagus 01/2016   Bradycardia    Chronic kidney disease    stage III   Diverticulosis    GERD (gastroesophageal reflux disease)    Hypertension    Hypothyroidism    Ulcer disease    Past Surgical History:  Procedure Laterality Date   CATARACT EXTRACTION W/PHACO Left 08/30/2021   Procedure: CATARACT EXTRACTION PHACO AND INTRAOCULAR LENS PLACEMENT ([place]) LEFT 8.21 01:00.3;  Surgeon: Leandrew Koyanagi, MD;  Location: [facility];  Service: Ophthalmology;  Laterality: Left;   CATARACT EXTRACTION W/PHACO Right 09/13/2021   Procedure: CATARACT EXTRACTION PHACO AND INTRAOCULAR LENS PLACEMENT ([place]) RIGHT;  Surgeon: Leandrew Koyanagi, MD;  Location: [facility];  Service: Ophthalmology;  Laterality: Right;  8.11 00:59.8   CHOLECYSTECTOMY N/A 06/25/2018   Procedure: LAPAROSCOPIC CHOLECYSTECTOMY WITH INTRAOPERATIVE CHOLANGIOGRAM;  Surgeon: Robert Bellow, MD;  Location: [facility];  Service: General;  Laterality:  N/A;   ESOPHAGOGASTRODUODENOSCOPY (EGD) WITH PROPOFOL N/A 01/30/2016   Procedure: ESOPHAGOGASTRODUODENOSCOPY (EGD) WITH PROPOFOL;  Surgeon: Lollie Sails, MD;  Location: [facility] ENDOSCOPY;  Service: Endoscopy;  Laterality: N/A;   ESOPHAGOGASTRODUODENOSCOPY (EGD) WITH PROPOFOL N/A 01/14/2018   Procedure: ESOPHAGOGASTRODUODENOSCOPY (EGD) WITH PROPOFOL;  Surgeon: Lollie Sails, MD;  Location: [facility]OSCOPY;  Service: Endoscopy;  Laterality: N/A;   ESOPHAGOGASTRODUODENOSCOPY (EGD) WITH PROPOFOL N/A 05/20/2018   Procedure: ESOPHAGOGASTRODUODENOSCOPY (EGD) WITH PROPOFOL;  Surgeon: Lollie Sails, MD;  Location: [facility]COPY;  Service: Endoscopy;  Laterality: N/A;   ESOPHAGOGASTRODUODENOSCOPY (EGD) WITH PROPOFOL N/A 01/13/2019   Procedure: ESOPHAGOGASTRODUODENOSCOPY (EGD) WITH PROPOFOL;  Surgeon: Lollie Sails, MD;  Location: [facility] ENDOSCOPY;  Service: Endoscopy;  Laterality: N/A;   HERNIA REPAIR     lap hiatal hernia repair  07/08/08   LEFT OOPHORECTOMY  1993   benign ovarian cyst   TUBAL LIGATION  1981   Patient Active Problem List   Diagnosis Date Noted   Bladder prolapse, female, acquired 05/06/2022   Hematoma 10/22/2021   Right knee pain 10/13/2021   SVT (supraventricular tachycardia) 10/07/2021   PSVT (paroxysmal supraventricular tachycardia) 10/06/2021   Numbness in feet 06/18/2021   CKD (chronic kidney disease) 06/12/2021   Dizziness 04/10/2021   Chest tightness 02/07/2021   Diarrhea 10/09/2020   Abdominal discomfort 09/27/2019   Gall bladder polyp 05/07/2018   Breast tenderness 05/07/2018  Hyperglycemia 09/10/2016   Fullness of breast 09/10/2016   Autoimmune hepatitis ([place]) 05/10/2016   MGUS (monoclonal gammopathy of unknown significance) 06/06/2015   LUQ fullness 06/06/2015   Health care maintenance 06/06/2015   Fatty tumor 03/07/2015   BMI 32.0-32.9,adult 01/02/2015   Abnormal liver function tests 01/02/2015   Hand cramps 11/15/2014   Leg cramps 06/13/2014    Osteopenia 03/02/2013   Hyperbilirubinemia 03/02/2013   Hypertension 12/22/2012   Gastroparesis 12/22/2012   Barrett's esophagus with esophagitis 12/22/2012   Hypothyroidism 12/22/2012   Anemia 12/22/2012   B12 deficiency 12/22/2012   CKD (chronic kidney disease) stage 3, GFR 30-59 ml/min (HCC) 12/22/2012    REFERRING DIAG: M25.561 (ICD-10-CM) - Right knee pain, unspecified chronicity  THERAPY DIAG:  Muscle weakness (generalized)  Difficulty in walking, not elsewhere classified  Rationale for Evaluation and Treatment Rehabilitation  PERTINENT HISTORY: Pt was initially sent her by PCP for right knee pain from a patella fracture that she sustained back in February.  She occasionally feels pain in her left knee, but she does not identify this as a problem. Pt describes feeling like she is walking stiff where she is having to lift the right leg upstairs.  She feels afraid of falling and she walks slower as a result. She does not normally use a SPC with the exception of shopping in a grocery store.   PRECAUTIONS: Falls   SUBJECTIVE: Pt reports feeling increased pain with knee extension and that her balance feels off after performing exercise.   PAIN:  Are you having pain? No   OBJECTIVE:                 BP 180/54 HR 58 SpO2 100      DIAGNOSTIC FINDINGS: CLINICAL DATA:  Right knee pain, fall   EXAM: RIGHT KNEE - 1-2 VIEW   COMPARISON:  None.   FINDINGS: Advanced tricompartment degenerative changes with joint space narrowing and spurring. Moderate joint effusion. No acute bony abnormality. Specifically, no fracture, subluxation, or dislocation.   IMPRESSION: No acute bony abnormality.  Degenerative changes and joint effusion.     Electronically Signed   By: Rolm Baptise M.D.   On: 10/16/2021 20:51   PATIENT SURVEYS:  FOTO 45/100 with target of 52    COGNITION:           Overall cognitive status: Within functional limits for tasks assessed                           SENSATION: WFL       MUSCLE LENGTH: Hamstrings: Right 60 deg; Left 60 deg     POSTURE: rounded shoulders and forward head   PALPATION: TTP of anterior surface of bilateral knees    LOWER EXTREMITY ROM:     Active /PROM Right 10/04/2022 Left 10/04/2022  Hip flexion 120 120  Hip extension 30 30  Hip abduction 45 45  Hip adduction 30 30  Hip internal rotation 45 45  Hip external rotation 45 45  Knee flexion 110/120 110/120  Knee extension 0 0  Ankle dorsiflexion      Ankle plantarflexion      Ankle inversion      Ankle eversion       (Blank rows = not tested)        LOWER EXTREMITY MMT:   MMT Right eval Left eval  Hip flexion 4 4  Hip extension 4- 4-  Hip abduction 4- 4-  Hip  adduction 3+ 3+  Hip internal rotation      Hip external rotation      Knee flexion 4 4  Knee extension 4 4  Ankle dorsiflexion      Ankle plantarflexion      Ankle inversion      Ankle eversion       (Blank rows = not tested)   LOWER EXTREMITY SPECIAL TESTS:  Hip special tests: Marcello Moores test: negative and Ely's test: positive    FUNCTIONAL TESTS:  5 times sit to stand: 19 sec  30 seconds chair stand test: 9 reps  Dynamic Gait Index: NT    GAIT: Distance walked: 10 meters  Assistive device utilized: None Level of assistance: Complete Independence Comments: Decreased step length on RLE        TODAY'S TREATMENT:  11/20/22: Nu-Step with seat and arms at 6 for 5 min  Step Ups on 4 inch step: Up with RLE and down with LLE with BUE support 1 x 10  Step Ups on 4 inch step: Up with RLE and down with LLE with 1 UE support 1 x 10  Step Ups on 4 inch step: Up with RLE and down with LLE with 2 finger support 1 x 10  Single Leg Stance on RLE 3 sec  -Pt unable to maintain SLS for long period without LOB  Modified SLS with 2 inch black yoga foam block on RLE and LLE 3 x 30 sec  OMEGA HS Curl on RLE with #10 1 x 10  OMEGA HS Curl on RLE with #15 1 x 10  OMEGA Knee Ext with #5 3  x  10   11/08/22: TM at 1.3 mph for 5 min with [place] Leg Press #25 1 x 10   OMEGA Leg Press #35 1 x 10  OMEGA Leg Press #45 1 x 10   OMEGA  HS Curls #15 3 x 10  OMEGA HS Curls #20 1 x 5  -Pt unable to perform without compensation    OMEGA Knee Ext #10 3 x 10  OMEGA Knee Ext #15 1 x 5  -Pt unable to perform without compensation   Long Arch Quad #5 AW 2 x 10  Long Arch Quad #7.5 AW 2 x 10  Long Arch Quad #10 AW 2 x 10   Seated Long Arc Quad with Black TB on RLE 1 x 10  -Pt able to perform independently  11/06/22: TM at 1.5 mph for 5 min and with BUE support  60mT: 10 m/ 15.35 sec =0.65 m/sec  DGI: 19/24  -mild impairment with obstacles, turn and stop, and fast and slow  -moderate impairment with stairs   MMT: Knee Ext R/L4/4 11/06/22: Hip Adduction R/L 4-/4-, Hip Abduction R/L 4/4, Hip Ext R/L  4/4, Knee Flex R/L 5/5, Knee Ext R/L 5/5  5xSTS: 26 sec  30 sec chair stands: 6 reps  Standing Marches 2 x 10 with 1 finger support    11/01/22: FOTO: 51/100 with target of 52  Sit to stand from 18 inch mat height 3 x 10  -min VC for fast concentric and slow eccentric  Step Up onto 12 inch stair with BUE support 1 x 5 -Pt unable to perform with assistance from PT  Step Up onto 12 inch stair with 4 inch step with BUE support 1 x 10 on RLE  Step Up onto 6 inch step with BUE support 1 x 10 on RLE   NMR  10 m x 4 with three 1 ft obstacles  -Needs to slow down to step over obstacle  10 m x 6 with 4 canes  10 m x 6 with verbal cues to change directions  10 m x 6 with verbal cues to change directions or before horizontal head turns  10 m x 6 with verbal cues to change directions or before horizontal head turns and vertical head turns   10/30/22:            TM at 1.3 mph for 5 min             Seated HS Stretch with calf stretch 3 x 60 sec             5xSTS: >30 sec             30 sec: 2 reps             Sit to Stand from 20 inch mat height 2 x 8             Sit to Stand  from 19 inch mat height 1 x 8              -min VC to train for power with fast stand and low sit             Step Up and Step Down using 1 UE  Step Up and Step Down using BUE support 3 x 10 on 4 inch step       PATIENT EDUCATION:  Education details: form and education for appropriate exercise and explanation of tests and measures  Person educated: Patient Education method: Consulting civil engineer, Demonstration, Verbal cues, and Handouts Education comprehension: verbalized understanding, returned demonstration, and verbal cues required     HOME EXERCISE PROGRAM: Access Code: [email or web address] URL: [email or web address] Date: 11/08/2022 Prepared by: Bradly Chris  Exercises - Sit to Stand  - 3 x weekly - 3 sets - 10 reps - Prone Quadriceps Stretch with Strap  - 1 x daily - 3 reps - 30-60 sec  hold - Standing March  - 3 x weekly - 3 sets - 10 reps - Single Leg Heel Raise with Counter Support  - 3 x weekly - 3 sets - 10 reps - Side Lunge Adductor Stretch  - 1 x daily - 3 reps - 30 hold - Seated Table Hamstring Stretch  - 1 x daily - 3 reps - 30-60 sec hold - Lateral Step Up with Counter Support  - 3 x weekly - 3 sets - 10 reps - Step Up  - 3 x weekly - 3 sets - 10 reps - Sidelying Hip Adduction  - 3 x weekly - 3 sets - 10 reps - Sitting Knee Extension with Resistance  - 3 x weekly - 3 sets - 10 reps     ASSESSMENT:   CLINICAL IMPRESSION:  Pt shows improvement with RLE strength with ability to perform step ups with decreased UE support. Crepitus heard in bilateral knees with knee extension that is not accompanied by pain. She was able to perform all exercises without an increase in pain. She still continues to struggle with right leg strength especially with right knee extension that makes her less steady when negotiating stairs. She will continue to benefit from skilled PT to improve LE strength and dynamic balance to walk without unsteadiness and to improve mobility.    OBJECTIVE  IMPAIRMENTS Abnormal gait, decreased balance, decreased ROM, and decreased strength.    ACTIVITY  LIMITATIONS squatting, stairs, and locomotion level   PARTICIPATION LIMITATIONS: shopping, community activity, and yard work   [place] Age and 3+ comorbidities: chronic kidney disease, HTN, and h/o right knee patella fracture   are also affecting patient's functional outcome.    REHAB POTENTIAL: Good   CLINICAL DECISION MAKING: Stable/uncomplicated   EVALUATION COMPLEXITY: Low     GOALS: Goals reviewed with patient? No   SHORT TERM GOALS: Target date: 10/18/2022  Pt will be independent with HEP in order to improve strength and balance in order to decrease fall risk and improve function at home and work. Baseline: Performing independently  Goal status: Ongoing    2.  Pt will decrease 5TSTS by at least 3 seconds in order to demonstrate clinically significant improvement in LE strength.  Baseline: 9 sec  Goal status: Ongoing    3.  Pt will increase 10MWT by at least 0.13 m/s in order to demonstrate clinically significant improvement in community ambulation. Baseline: 0.50 m/sec  11/06/22: 0.65 m/sec  Goal status: Achieved        LONG TERM GOALS: Target date: 12/13/2022    Patient will have improved function and activity level as evidenced by an increase in FOTO score by 10 points or more. Baseline: 43/100 with target of 52  Goal status: Ongoing    2.  Patient will improve hip and knee strength by 1/3 MMT (I.e 4- to 4) for improved hip stability to improve steadiness of gait to decrease risk of falling.  Baseline: Hip Adduction R/L 3+/3+, Hip Abduction R/L 4-/4-, Hip Ext R/L  4-/-, Knee Flex R/L 4/4, Knee Ext R/L4/4 11/06/22: Hip Adduction R/L 4-/4-, Hip Abduction R/L 4/4, Hip Ext R/L  4/4, Knee Flex R/L 5/5, Knee Ext R/L 5/5 Goal status: Achieved    3.  Patient will demonstrate reduced falls risk as evidenced by Dynamic Gait Index (DGI) >19/24. Baseline:15/24  11/06/22:  19/24 Goal status: Partially Met     4.  Patient will improve 5 x STS to 11 sec to demonstrate improved LE strength that places her at a decreased risk for falls. Baseline: 19 sec   11/06/22: 24 sec  Goal status: Ongoing    5.  Patient will demonstrate a gait speed > 1.0 m/sec to show that she capable of a gait speed that places her at a decreased risk for falls.  Baseline: 0.50 m/sec 11/06/22: 0.65 m/sec  Goal status: Partially met    6.  Patient will perform 8>= sit to stands within 30 sec to show LE endurance that places her at a decreased risk for falls.  Baseline: 9 reps   11/06/22: 6 reps  Goal status: Partially met      PLAN: PT FREQUENCY: 1-2x/week   PT DURATION: 10 weeks   PLANNED INTERVENTIONS: Therapeutic exercises, Neuromuscular re-education, Balance training, Gait training, Joint mobilization, Joint manipulation, Stair training, DME instructions, Aquatic Therapy, Dry Needling, Spinal manipulation, Spinal mobilization, Cryotherapy, Moist heat, Manual therapy, and Re-evaluation   PLAN FOR NEXT SESSION: Continue to progress   Progress LE strengthening exercises to include increased step height and decreased mat height. Do dynamic balance exercises: tight rope walk, walking backwards, etc.    Bradly Chris PT, DPT  Physical Therapist- [facility] 11/20/2022, 1:35 PM

## 2022-11-27 ENCOUNTER — Other Ambulatory Visit: Payer: Self-pay | Admitting: Internal Medicine

## 2022-11-27 ENCOUNTER — Ambulatory Visit: Payer: Medicare Other

## 2022-11-27 ENCOUNTER — Encounter: Payer: Self-pay | Admitting: Physical Therapy

## 2022-11-27 DIAGNOSIS — M6281 Muscle weakness (generalized): Secondary | ICD-10-CM

## 2022-11-27 DIAGNOSIS — R262 Difficulty in walking, not elsewhere classified: Secondary | ICD-10-CM

## 2022-11-27 NOTE — Therapy (Cosign Needed)
OUTPATIENT PHYSICAL THERAPY TREATMENT NOTE   Patient Name: Lori [name] MRN: [id number] DOB:11-04-1934, 88 y.o., female Today's Date: 11/27/2022  PCP: Dr. Einar Pheasant  REFERRING PROVIDER: Dr. Einar Pheasant   END OF SESSION:   PT End of Session - 11/27/22 1335     Visit Number 13    Number of Visits 20    Date for PT Re-Evaluation 12/13/22    Authorization Type Medicare    Authorization Time Period 10/04/22-12/13/22    Authorization - Visit Number 13    Authorization - Number of Visits 20    Progress Note Due on Visit 20    PT Start Time 1332    PT Stop Time 1414    PT Time Calculation (min) 42 min    Equipment Utilized During Treatment Gait belt    Activity Tolerance Patient tolerated treatment well    Behavior During Therapy WFL for tasks assessed/performed              Past Medical History:  Diagnosis Date   Anemia    iron deficient   Arthritis    Autoimmune hepatitis ([place]) 05/10/2016   Hep C   B12 deficiency    Barrett esophagus 01/2016   Bradycardia    Chronic kidney disease    stage III   Diverticulosis    GERD (gastroesophageal reflux disease)    Hypertension    Hypothyroidism    Ulcer disease    Past Surgical History:  Procedure Laterality Date   CATARACT EXTRACTION W/PHACO Left 08/30/2021   Procedure: CATARACT EXTRACTION PHACO AND INTRAOCULAR LENS PLACEMENT ([place]) LEFT 8.21 01:00.3;  Surgeon: Leandrew Koyanagi, MD;  Location: [place];  Service: Ophthalmology;  Laterality: Left;   CATARACT EXTRACTION W/PHACO Right 09/13/2021   Procedure: CATARACT EXTRACTION PHACO AND INTRAOCULAR LENS PLACEMENT ([place]) RIGHT;  Surgeon: Leandrew Koyanagi, MD;  Location: [place];  Service: Ophthalmology;  Laterality: Right;  8.11 00:59.8   CHOLECYSTECTOMY N/A 06/25/2018   Procedure: LAPAROSCOPIC CHOLECYSTECTOMY WITH INTRAOPERATIVE CHOLANGIOGRAM;  Surgeon: Robert Bellow, MD;  Location: [facility];  Service: General;  Laterality:  N/A;   ESOPHAGOGASTRODUODENOSCOPY (EGD) WITH PROPOFOL N/A 01/30/2016   Procedure: ESOPHAGOGASTRODUODENOSCOPY (EGD) WITH PROPOFOL;  Surgeon: Lollie Sails, MD;  Location: [facility] ENDOSCOPY;  Service: Endoscopy;  Laterality: N/A;   ESOPHAGOGASTRODUODENOSCOPY (EGD) WITH PROPOFOL N/A 01/14/2018   Procedure: ESOPHAGOGASTRODUODENOSCOPY (EGD) WITH PROPOFOL;  Surgeon: Lollie Sails, MD;  Location: [facility] ENDOSCOPY;  Service: Endoscopy;  Laterality: N/A;   ESOPHAGOGASTRODUODENOSCOPY (EGD) WITH PROPOFOL N/A 05/20/2018   Procedure: ESOPHAGOGASTRODUODENOSCOPY (EGD) WITH PROPOFOL;  Surgeon: Lollie Sails, MD;  Location: [facility] ENDOSCOPY;  Service: Endoscopy;  Laterality: N/A;   ESOPHAGOGASTRODUODENOSCOPY (EGD) WITH PROPOFOL N/A 01/13/2019   Procedure: ESOPHAGOGASTRODUODENOSCOPY (EGD) WITH PROPOFOL;  Surgeon: Lollie Sails, MD;  Location: [facility] ENDOSCOPY;  Service: Endoscopy;  Laterality: N/A;   HERNIA REPAIR     lap hiatal hernia repair  07/08/08   LEFT OOPHORECTOMY  1993   benign ovarian cyst   TUBAL LIGATION  1981   Patient Active Problem List   Diagnosis Date Noted   Bladder prolapse, female, acquired 05/06/2022   Hematoma 10/22/2021   Right knee pain 10/13/2021   SVT (supraventricular tachycardia) 10/07/2021   PSVT (paroxysmal supraventricular tachycardia) 10/06/2021   Numbness in feet 06/18/2021   CKD (chronic kidney disease) 06/12/2021   Dizziness 04/10/2021   Chest tightness 02/07/2021   Diarrhea 10/09/2020   Abdominal discomfort 09/27/2019   Gall bladder polyp 05/07/2018   Breast tenderness 05/07/2018  Hyperglycemia 09/10/2016   Fullness of breast 09/10/2016   Autoimmune hepatitis ([place]) 05/10/2016   MGUS (monoclonal gammopathy of unknown significance) 06/06/2015   LUQ fullness 06/06/2015   Health care maintenance 06/06/2015   Fatty tumor 03/07/2015   BMI 32.0-32.9,adult 01/02/2015   Abnormal liver function tests 01/02/2015   Hand cramps 11/15/2014   Leg cramps 06/13/2014    Osteopenia 03/02/2013   Hyperbilirubinemia 03/02/2013   Hypertension 12/22/2012   Gastroparesis 12/22/2012   Barrett's esophagus with esophagitis 12/22/2012   Hypothyroidism 12/22/2012   Anemia 12/22/2012   B12 deficiency 12/22/2012   CKD (chronic kidney disease) stage 3, GFR 30-59 ml/min (HCC) 12/22/2012    REFERRING DIAG: M25.561 (ICD-10-CM) - Right knee pain, unspecified chronicity  THERAPY DIAG:  Muscle weakness (generalized)  Difficulty in walking, not elsewhere classified  Rationale for Evaluation and Treatment Rehabilitation  PERTINENT HISTORY: Pt was initially sent her by PCP for right knee pain from a patella fracture that she sustained back in February.  She occasionally feels pain in her left knee, but she does not identify this as a problem. Pt describes feeling like she is walking stiff where she is having to lift the right leg upstairs.  She feels afraid of falling and she walks slower as a result. She does not normally use a SPC with the exception of shopping in a grocery store.   PRECAUTIONS: Falls   SUBJECTIVE: Pt reports soreness in knee. Still having difficulty with steps. No pain.  PAIN:  Are you having pain? No   OBJECTIVE:                 BP 180/54 HR 58 SpO2 100      DIAGNOSTIC FINDINGS: CLINICAL DATA:  Right knee pain, fall   EXAM: RIGHT KNEE - 1-2 VIEW   COMPARISON:  None.   FINDINGS: Advanced tricompartment degenerative changes with joint space narrowing and spurring. Moderate joint effusion. No acute bony abnormality. Specifically, no fracture, subluxation, or dislocation.   IMPRESSION: No acute bony abnormality.  Degenerative changes and joint effusion.     Electronically Signed   By: Rolm Baptise M.D.   On: 10/16/2021 20:51   PATIENT SURVEYS:  FOTO 45/100 with target of 52    COGNITION:           Overall cognitive status: Within functional limits for tasks assessed                          SENSATION: WFL       MUSCLE  LENGTH: Hamstrings: Right 60 deg; Left 60 deg     POSTURE: rounded shoulders and forward head   PALPATION: TTP of anterior surface of bilateral knees    LOWER EXTREMITY ROM:     Active /PROM Right 10/04/2022 Left 10/04/2022  Hip flexion 120 120  Hip extension 30 30  Hip abduction 45 45  Hip adduction 30 30  Hip internal rotation 45 45  Hip external rotation 45 45  Knee flexion 110/120 110/120  Knee extension 0 0  Ankle dorsiflexion      Ankle plantarflexion      Ankle inversion      Ankle eversion       (Blank rows = not tested)        LOWER EXTREMITY MMT:   MMT Right eval Left eval  Hip flexion 4 4  Hip extension 4- 4-  Hip abduction 4- 4-  Hip adduction 3+ 3+  Hip internal  rotation      Hip external rotation      Knee flexion 4 4  Knee extension 4 4  Ankle dorsiflexion      Ankle plantarflexion      Ankle inversion      Ankle eversion       (Blank rows = not tested)   LOWER EXTREMITY SPECIAL TESTS:  Hip special tests: Marcello Moores test: negative and Ely's test: positive    FUNCTIONAL TESTS:  5 times sit to stand: 19 sec  30 seconds chair stand test: 9 reps  Dynamic Gait Index: NT    GAIT: Distance walked: 10 meters  Assistive device utilized: None Level of assistance: Complete Independence Comments: Decreased step length on RLE        TODAY'S TREATMENT:  There.ex:  11/27/22:  Nu-Step with seat and arms at 6 for 6 min  6" step ups with BUE support. Requires significant pull of BUE's. X8  4" step ups with RUE support leading with RLE for concentric and eccentric portions   4" lateral steps with light BUE support for reduced falls risk. X12/direction   STS with L foot propped on airex pad to increase WB on RLE: 3x8, CGA. Progressed from BUE support on arm rests to SUE support on L arm rest. Improved eccentric control with descent as reps continue.   OMEGA Leg press BLE: 2x8, 45#  RLE only:    11/20/22: Nu-Step with seat and arms at 6 for 5  min  Step Ups on 4 inch step: Up with RLE and down with LLE with BUE support 1 x 10  Step Ups on 4 inch step: Up with RLE and down with LLE with 1 UE support 1 x 10  Step Ups on 4 inch step: Up with RLE and down with LLE with 2 finger support 1 x 10  Single Leg Stance on RLE 3 sec  -Pt unable to maintain SLS for long period without LOB  Modified SLS with 2 inch black yoga foam block on RLE and LLE 3 x 30 sec  OMEGA HS Curl on RLE with #10 1 x 10  OMEGA HS Curl on RLE with #15 1 x 10  OMEGA Knee Ext with #5 3  x 10   11/08/22: TM at 1.3 mph for 5 min with [place] Leg Press #25 1 x 10   OMEGA Leg Press #35 1 x 10  OMEGA Leg Press #45 1 x 10   OMEGA  HS Curls #15 3 x 10  OMEGA HS Curls #20 1 x 5  -Pt unable to perform without compensation    OMEGA Knee Ext #10 3 x 10  OMEGA Knee Ext #15 1 x 5  -Pt unable to perform without compensation   Long Arch Quad #5 AW 2 x 10  Long Arch Quad #7.5 AW 2 x 10  Long Arch Quad #10 AW 2 x 10   Seated Long Arc Quad with Black TB on RLE 1 x 10  -Pt able to perform independently  11/06/22: TM at 1.5 mph for 5 min and with BUE support  19mT: 10 m/ 15.35 sec =0.65 m/sec  DGI: 19/24  -mild impairment with obstacles, turn and stop, and fast and slow  -moderate impairment with stairs   MMT: Knee Ext R/L4/4 11/06/22: Hip Adduction R/L 4-/4-, Hip Abduction R/L 4/4, Hip Ext R/L  4/4, Knee Flex R/L 5/5, Knee Ext R/L 5/5  5xSTS: 26 sec  30 sec chair  stands: 6 reps  Standing Marches 2 x 10 with 1 finger support    11/01/22: FOTO: 51/100 with target of 52  Sit to stand from 18 inch mat height 3 x 10  -min VC for fast concentric and slow eccentric  Step Up onto 12 inch stair with BUE support 1 x 5 -Pt unable to perform with assistance from PT  Step Up onto 12 inch stair with 4 inch step with BUE support 1 x 10 on RLE  Step Up onto 6 inch step with BUE support 1 x 10 on RLE   NMR  10 m x 4 with three 1 ft obstacles  -Needs to slow down to  step over obstacle  10 m x 6 with 4 canes  10 m x 6 with verbal cues to change directions  10 m x 6 with verbal cues to change directions or before horizontal head turns  10 m x 6 with verbal cues to change directions or before horizontal head turns and vertical head turns   10/30/22:            TM at 1.3 mph for 5 min             Seated HS Stretch with calf stretch 3 x 60 sec             5xSTS: >30 sec             30 sec: 2 reps             Sit to Stand from 20 inch mat height 2 x 8             Sit to Stand from 19 inch mat height 1 x 8              -min VC to train for power with fast stand and low sit             Step Up and Step Down using 1 UE  Step Up and Step Down using BUE support 3 x 10 on 4 inch step       PATIENT EDUCATION:  Education details: form and education for appropriate exercise and explanation of tests and measures  Person educated: Patient Education method: Consulting civil engineer, Demonstration, Verbal cues, and Handouts Education comprehension: verbalized understanding, returned demonstration, and verbal cues required     HOME EXERCISE PROGRAM: Access Code: [email or web address] URL: [email or web address] Date: 11/08/2022 Prepared by: Bradly Chris  Exercises - Sit to Stand  - 3 x weekly - 3 sets - 10 reps - Prone Quadriceps Stretch with Strap  - 1 x daily - 3 reps - 30-60 sec  hold - Standing March  - 3 x weekly - 3 sets - 10 reps - Single Leg Heel Raise with Counter Support  - 3 x weekly - 3 sets - 10 reps - Side Lunge Adductor Stretch  - 1 x daily - 3 reps - 30 hold - Seated Table Hamstring Stretch  - 1 x daily - 3 reps - 30-60 sec hold - Lateral Step Up with Counter Support  - 3 x weekly - 3 sets - 10 reps - Step Up  - 3 x weekly - 3 sets - 10 reps - Sidelying Hip Adduction  - 3 x weekly - 3 sets - 10 reps - Sitting Knee Extension with Resistance  - 3 x weekly - 3 sets - 10 reps     ASSESSMENT:   CLINICAL IMPRESSION: Continuing PT POC focusing  on  more specific RLE loading to quad and glutes to meet goals towards improved stair navigation. Educated pt on importance of HEP compliance as pt has limited days she can attend PT until end of POC. Pt understanding. Pt educated to continue RLE dominant exercises to improve RLE strength. Pt will continue to benefit from skilled PT services to address remaining strength deficits to improve stair navigation.   OBJECTIVE IMPAIRMENTS Abnormal gait, decreased balance, decreased ROM, and decreased strength.    ACTIVITY LIMITATIONS squatting, stairs, and locomotion level   PARTICIPATION LIMITATIONS: shopping, community activity, and yard work   [place] Age and 3+ comorbidities: chronic kidney disease, HTN, and h/o right knee patella fracture   are also affecting patient's functional outcome.    REHAB POTENTIAL: Good   CLINICAL DECISION MAKING: Stable/uncomplicated   EVALUATION COMPLEXITY: Low     GOALS: Goals reviewed with patient? No   SHORT TERM GOALS: Target date: 10/18/2022  Pt will be independent with HEP in order to improve strength and balance in order to decrease fall risk and improve function at home and work. Baseline: Performing independently  Goal status: Ongoing    2.  Pt will decrease 5TSTS by at least 3 seconds in order to demonstrate clinically significant improvement in LE strength.  Baseline: 9 sec  Goal status: Ongoing    3.  Pt will increase 10MWT by at least 0.13 m/s in order to demonstrate clinically significant improvement in community ambulation. Baseline: 0.50 m/sec  11/06/22: 0.65 m/sec  Goal status: Achieved        LONG TERM GOALS: Target date: 12/13/2022    Patient will have improved function and activity level as evidenced by an increase in FOTO score by 10 points or more. Baseline: 43/100 with target of 52  Goal status: Ongoing    2.  Patient will improve hip and knee strength by 1/3 MMT (I.e 4- to 4) for improved hip stability to improve  steadiness of gait to decrease risk of falling.  Baseline: Hip Adduction R/L 3+/3+, Hip Abduction R/L 4-/4-, Hip Ext R/L  4-/-, Knee Flex R/L 4/4, Knee Ext R/L4/4 11/06/22: Hip Adduction R/L 4-/4-, Hip Abduction R/L 4/4, Hip Ext R/L  4/4, Knee Flex R/L 5/5, Knee Ext R/L 5/5 Goal status: Achieved    3.  Patient will demonstrate reduced falls risk as evidenced by Dynamic Gait Index (DGI) >19/24. Baseline:15/24  11/06/22: 19/24 Goal status: Partially Met     4.  Patient will improve 5 x STS to 11 sec to demonstrate improved LE strength that places her at a decreased risk for falls. Baseline: 19 sec   11/06/22: 24 sec  Goal status: Ongoing    5.  Patient will demonstrate a gait speed > 1.0 m/sec to show that she capable of a gait speed that places her at a decreased risk for falls.  Baseline: 0.50 m/sec 11/06/22: 0.65 m/sec  Goal status: Partially met    6.  Patient will perform 8>= sit to stands within 30 sec to show LE endurance that places her at a decreased risk for falls.  Baseline: 9 reps   11/06/22: 6 reps  Goal status: Partially met      PLAN: PT FREQUENCY: 1-2x/week   PT DURATION: 10 weeks   PLANNED INTERVENTIONS: Therapeutic exercises, Neuromuscular re-education, Balance training, Gait training, Joint mobilization, Joint manipulation, Stair training, DME instructions, Aquatic Therapy, Dry Needling, Spinal manipulation, Spinal mobilization, Cryotherapy, Moist heat, Manual therapy, and Re-evaluation   PLAN FOR NEXT  SESSION: Continue to progress   Progress LE strengthening exercises to include increased step height and decreased mat height. Do dynamic balance exercises: tight rope walk, walking backwards, etc.    Salem Caster. Fairly IV, PT, DPT Physical Therapist- [facility]  11/27/2022, 2:15 PM

## 2022-12-04 ENCOUNTER — Encounter: Payer: Self-pay | Admitting: Physical Therapy

## 2022-12-04 ENCOUNTER — Ambulatory Visit: Payer: Medicare Other | Attending: Internal Medicine

## 2022-12-04 DIAGNOSIS — M6281 Muscle weakness (generalized): Secondary | ICD-10-CM | POA: Diagnosis not present

## 2022-12-04 DIAGNOSIS — R262 Difficulty in walking, not elsewhere classified: Secondary | ICD-10-CM | POA: Diagnosis not present

## 2022-12-04 NOTE — Therapy (Signed)
OUTPATIENT PHYSICAL THERAPY TREATMENT NOTE   Patient Name: Lori [name] MRN: [id number] DOB:Dec 23, 1934, 88 y.o., female Today's Date: 12/04/2022  PCP: Dr. Einar Pheasant  REFERRING PROVIDER: Dr. Einar Pheasant   END OF SESSION:   PT End of Session - 12/04/22 1155     Visit Number 14    Number of Visits 20    Date for PT Re-Evaluation 12/13/22    Authorization Type Medicare    Authorization Time Period 10/04/22-12/13/22    Authorization - Visit Number 60    Authorization - Number of Visits 20    Progress Note Due on Visit 20    PT Start Time 1150    PT Stop Time 1230    PT Time Calculation (min) 40 min    Equipment Utilized During Treatment Gait belt    Activity Tolerance Patient tolerated treatment well    Behavior During Therapy WFL for tasks assessed/performed              Past Medical History:  Diagnosis Date   Anemia    iron deficient   Arthritis    Autoimmune hepatitis ([place]) 05/10/2016   Hep C   B12 deficiency    Barrett esophagus 01/2016   Bradycardia    Chronic kidney disease    stage III   Diverticulosis    GERD (gastroesophageal reflux disease)    Hypertension    Hypothyroidism    Ulcer disease    Past Surgical History:  Procedure Laterality Date   CATARACT EXTRACTION W/PHACO Left 08/30/2021   Procedure: CATARACT EXTRACTION PHACO AND INTRAOCULAR LENS PLACEMENT ([place]) LEFT 8.21 01:00.3;  Surgeon: Leandrew Koyanagi, MD;  Location: [facility];  Service: Ophthalmology;  Laterality: Left;   CATARACT EXTRACTION W/PHACO Right 09/13/2021   Procedure: CATARACT EXTRACTION PHACO AND INTRAOCULAR LENS PLACEMENT ([place]) RIGHT;  Surgeon: Leandrew Koyanagi, MD;  Location: [facility];  Service: Ophthalmology;  Laterality: Right;  8.11 00:59.8   CHOLECYSTECTOMY N/A 06/25/2018   Procedure: LAPAROSCOPIC CHOLECYSTECTOMY WITH INTRAOPERATIVE CHOLANGIOGRAM;  Surgeon: Robert Bellow, MD;  Location: [facility];  Service: General;  Laterality:  N/A;   ESOPHAGOGASTRODUODENOSCOPY (EGD) WITH PROPOFOL N/A 01/30/2016   Procedure: ESOPHAGOGASTRODUODENOSCOPY (EGD) WITH PROPOFOL;  Surgeon: Lollie Sails, MD;  Location: [facility] ENDOSCOPY;  Service: Endoscopy;  Laterality: N/A;   ESOPHAGOGASTRODUODENOSCOPY (EGD) WITH PROPOFOL N/A 01/14/2018   Procedure: ESOPHAGOGASTRODUODENOSCOPY (EGD) WITH PROPOFOL;  Surgeon: Lollie Sails, MD;  Location: [facility] ENDOSCOPY;  Service: Endoscopy;  Laterality: N/A;   ESOPHAGOGASTRODUODENOSCOPY (EGD) WITH PROPOFOL N/A 05/20/2018   Procedure: ESOPHAGOGASTRODUODENOSCOPY (EGD) WITH PROPOFOL;  Surgeon: Lollie Sails, MD;  Location[facility];  Service: Endoscopy;  Laterality: N/A;   ESOPHAGOGASTRODUODENOSCOPY (EGD) WITH PROPOFOL N/A 01/13/2019   Procedure: ESOPHAGOGASTRODUODENOSCOPY (EGD) WITH PROPOFOL;  Surgeon: Lollie Sails, MD;  Location: [facility] ENDOSCOPY;  Service: Endoscopy;  Laterality: N/A;   HERNIA REPAIR     lap hiatal hernia repair  07/08/08   LEFT OOPHORECTOMY  1993   benign ovarian cyst   TUBAL LIGATION  1981   Patient Active Problem List   Diagnosis Date Noted   Bladder prolapse, female, acquired 05/06/2022   Hematoma 10/22/2021   Right knee pain 10/13/2021   SVT (supraventricular tachycardia) 10/07/2021   PSVT (paroxysmal supraventricular tachycardia) 10/06/2021   Numbness in feet 06/18/2021   CKD (chronic kidney disease) 06/12/2021   Dizziness 04/10/2021   Chest tightness 02/07/2021   Diarrhea 10/09/2020   Abdominal discomfort 09/27/2019   Gall bladder polyp 05/07/2018   Breast tenderness 05/07/2018  Hyperglycemia 09/10/2016   Fullness of breast 09/10/2016   Autoimmune hepatitis ([place]) 05/10/2016   MGUS (monoclonal gammopathy of unknown significance) 06/06/2015   LUQ fullness 06/06/2015   Health care maintenance 06/06/2015   Fatty tumor 03/07/2015   BMI 32.0-32.9,adult 01/02/2015   Abnormal liver function tests 01/02/2015   Hand cramps 11/15/2014   Leg cramps 06/13/2014    Osteopenia 03/02/2013   Hyperbilirubinemia 03/02/2013   Hypertension 12/22/2012   Gastroparesis 12/22/2012   Barrett's esophagus with esophagitis 12/22/2012   Hypothyroidism 12/22/2012   Anemia 12/22/2012   B12 deficiency 12/22/2012   CKD (chronic kidney disease) stage 3, GFR 30-59 ml/min ([facility]) 12/22/2012    REFERRING DIAG: M25.561 (ICD-10-CM) - Right knee pain, unspecified chronicity  THERAPY DIAG:  Muscle weakness (generalized)  Difficulty in walking, not elsewhere classified  Rationale for Evaluation and Treatment Rehabilitation  PERTINENT HISTORY: Pt was initially sent her by PCP for right knee pain from a patella fracture that she sustained back in February.  She occasionally feels pain in her left knee, but she does not identify this as a problem. Pt describes feeling like she is walking stiff where she is having to lift the right leg upstairs.  She feels afraid of falling and she walks slower as a result. She does not normally use a SPC with the exception of shopping in a grocery store.   PRECAUTIONS: Falls   SUBJECTIVE: Pt reports improved gait after last session. No soreness in LE's.  PAIN:  Are you having pain? No   OBJECTIVE:                 BP 180/54 HR 58 SpO2 100      DIAGNOSTIC FINDINGS: CLINICAL DATA:  Right knee pain, fall   EXAM: RIGHT KNEE - 1-2 VIEW   COMPARISON:  None.   FINDINGS: Advanced tricompartment degenerative changes with joint space narrowing and spurring. Moderate joint effusion. No acute bony abnormality. Specifically, no fracture, subluxation, or dislocation.   IMPRESSION: No acute bony abnormality.  Degenerative changes and joint effusion.     Electronically Signed   By: Rolm Baptise M.D.   On: 10/16/2021 20:51   PATIENT SURVEYS:  FOTO 45/100 with target of 52    COGNITION:           Overall cognitive status: Within functional limits for tasks assessed                          SENSATION: WFL       MUSCLE  LENGTH: Hamstrings: Right 60 deg; Left 60 deg     POSTURE: rounded shoulders and forward head   PALPATION: TTP of anterior surface of bilateral knees    LOWER EXTREMITY ROM:     Active /PROM Right 10/04/2022 Left 10/04/2022  Hip flexion 120 120  Hip extension 30 30  Hip abduction 45 45  Hip adduction 30 30  Hip internal rotation 45 45  Hip external rotation 45 45  Knee flexion 110/120 110/120  Knee extension 0 0  Ankle dorsiflexion      Ankle plantarflexion      Ankle inversion      Ankle eversion       (Blank rows = not tested)        LOWER EXTREMITY MMT:   MMT Right eval Left eval  Hip flexion 4 4  Hip extension 4- 4-  Hip abduction 4- 4-  Hip adduction 3+ 3+  Hip internal rotation  Hip external rotation      Knee flexion 4 4  Knee extension 4 4  Ankle dorsiflexion      Ankle plantarflexion      Ankle inversion      Ankle eversion       (Blank rows = not tested)   LOWER EXTREMITY SPECIAL TESTS:  Hip special tests: Marcello Moores test: negative and Ely's test: positive    FUNCTIONAL TESTS:  5 times sit to stand: 19 sec  30 seconds chair stand test: 9 reps  Dynamic Gait Index: NT    GAIT: Distance walked: 10 meters  Assistive device utilized: None Level of assistance: Complete Independence Comments: Decreased step length on RLE        TODAY'S TREATMENT:  There.ex:  12/04/22:  Nu-Step with seat and arms at 6 for 6 min  6" step ups with BUE support. Requires significant pull of BUE's. X8  4" step ups with RUE support leading with RLE for concentric and eccentric portions   4" lateral steps with light BUE support for reduced falls risk. 2x12/direction   STS with L foot propped on airex pad to increase WB on RLE: 3x8, CGA. Progressed from BUE support on arm rests to SUE support on L arm rest. Improved eccentric control with descent as reps continue. Tends to weight shift to the L with eccentric control off loading RLE.   OMEGA Leg press BLE: 2x8,  45#  RLE only: 2x6, 20#. Initial minA for lift off of platform then able to complete.   SLE lunges with SUE support: R/L, x8/LE. Mod VC's for form/technique with good carryover.     PATIENT EDUCATION:  Education details: form and education for appropriate exercise and explanation of tests and measures  Person educated: Patient Education method: Explanation, Demonstration, Verbal cues, and Handouts Education comprehension: verbalized understanding, returned demonstration, and verbal cues required     HOME EXERCISE PROGRAM: Access Code: [email or web address] URL: [email or web address] Date: 11/08/2022 Prepared by: Bradly Chris  Exercises - Sit to Stand  - 3 x weekly - 3 sets - 10 reps - Prone Quadriceps Stretch with Strap  - 1 x daily - 3 reps - 30-60 sec  hold - Standing March  - 3 x weekly - 3 sets - 10 reps - Single Leg Heel Raise with Counter Support  - 3 x weekly - 3 sets - 10 reps - Side Lunge Adductor Stretch  - 1 x daily - 3 reps - 30 hold - Seated Table Hamstring Stretch  - 1 x daily - 3 reps - 30-60 sec hold - Lateral Step Up with Counter Support  - 3 x weekly - 3 sets - 10 reps - Step Up  - 3 x weekly - 3 sets - 10 reps - Sidelying Hip Adduction  - 3 x weekly - 3 sets - 10 reps - Sitting Knee Extension with Resistance  - 3 x weekly - 3 sets - 10 reps     ASSESSMENT:   CLINICAL IMPRESSION: Continuing PT POC focusing on more specific RLE loading to quad and glutes to meet goals towards improved stair navigation. Pt able to tolerate increased single LE loading exercises without onset of R knee pain. Pt continues to demonstrate weakness with eccentric contractions on RLE with limited eccentric control and L sided weight shift despite multi modal cuing. Encouraged pt to keep focusing on single leg, RLE exercises to progress RLE strengthening for stair navigation. Pt will continue to benefit from skilled PT services to address  remaining strength deficits to improve stair  navigation.  OBJECTIVE IMPAIRMENTS Abnormal gait, decreased balance, decreased ROM, and decreased strength.    ACTIVITY LIMITATIONS squatting, stairs, and locomotion level   PARTICIPATION LIMITATIONS: shopping, community activity, and yard work   [place] Age and 3+ comorbidities: chronic kidney disease, HTN, and h/o right knee patella fracture   are also affecting patient's functional outcome.    REHAB POTENTIAL: Good   CLINICAL DECISION MAKING: Stable/uncomplicated   EVALUATION COMPLEXITY: Low     GOALS: Goals reviewed with patient? No   SHORT TERM GOALS: Target date: 10/18/2022  Pt will be independent with HEP in order to improve strength and balance in order to decrease fall risk and improve function at home and work. Baseline: Performing independently  Goal status: Ongoing    2.  Pt will decrease 5TSTS by at least 3 seconds in order to demonstrate clinically significant improvement in LE strength.  Baseline: 9 sec  Goal status: Ongoing    3.  Pt will increase 10MWT by at least 0.13 m/s in order to demonstrate clinically significant improvement in community ambulation. Baseline: 0.50 m/sec  11/06/22: 0.65 m/sec  Goal status: Achieved        LONG TERM GOALS: Target date: 12/13/2022    Patient will have improved function and activity level as evidenced by an increase in FOTO score by 10 points or more. Baseline: 43/100 with target of 52  Goal status: Ongoing    2.  Patient will improve hip and knee strength by 1/3 MMT (I.e 4- to 4) for improved hip stability to improve steadiness of gait to decrease risk of falling.  Baseline: Hip Adduction R/L 3+/3+, Hip Abduction R/L 4-/4-, Hip Ext R/L  4-/-, Knee Flex R/L 4/4, Knee Ext R/L4/4 11/06/22: Hip Adduction R/L 4-/4-, Hip Abduction R/L 4/4, Hip Ext R/L  4/4, Knee Flex R/L 5/5, Knee Ext R/L 5/5 Goal status: Achieved    3.  Patient will demonstrate reduced falls risk as evidenced by Dynamic Gait Index (DGI)  >19/24. Baseline:15/24  11/06/22: 19/24 Goal status: Partially Met     4.  Patient will improve 5 x STS to 11 sec to demonstrate improved LE strength that places her at a decreased risk for falls. Baseline: 19 sec   11/06/22: 24 sec  Goal status: Ongoing    5.  Patient will demonstrate a gait speed > 1.0 m/sec to show that she capable of a gait speed that places her at a decreased risk for falls.  Baseline: 0.50 m/sec 11/06/22: 0.65 m/sec  Goal status: Partially met    6.  Patient will perform 8>= sit to stands within 30 sec to show LE endurance that places her at a decreased risk for falls.  Baseline: 9 reps   11/06/22: 6 reps  Goal status: Partially met      PLAN: PT FREQUENCY: 1-2x/week   PT DURATION: 10 weeks   PLANNED INTERVENTIONS: Therapeutic exercises, Neuromuscular re-education, Balance training, Gait training, Joint mobilization, Joint manipulation, Stair training, DME instructions, Aquatic Therapy, Dry Needling, Spinal manipulation, Spinal mobilization, Cryotherapy, Moist heat, Manual therapy, and Re-evaluation   PLAN FOR NEXT SESSION: Continue to progress   Progress LE strengthening exercises to include increased step height and decreased mat height. Do dynamic balance exercises: tight rope walk, walking backwards, etc.    Salem Caster. Fairly IV, PT, DPT Physical Therapist- [facility]  12/04/2022, 12:41 PM

## 2022-12-11 ENCOUNTER — Encounter: Payer: Medicare Other | Admitting: Physical Therapy

## 2022-12-12 ENCOUNTER — Encounter: Payer: Self-pay | Admitting: Physical Therapy

## 2022-12-12 ENCOUNTER — Ambulatory Visit: Payer: Medicare Other

## 2022-12-12 DIAGNOSIS — R262 Difficulty in walking, not elsewhere classified: Secondary | ICD-10-CM | POA: Diagnosis not present

## 2022-12-12 DIAGNOSIS — M6281 Muscle weakness (generalized): Secondary | ICD-10-CM

## 2022-12-12 NOTE — Therapy (Addendum)
OUTPATIENT PHYSICAL THERAPY TREATMENT NOTE/ Re-Certification   Dates of Reporting: 10/04/22-12/13/22  Patient Name: Lori [name] MRN: [id number] DOB:March 01, 1934, 88 y.o., female Today's Date: 12/12/2022  PCP: Dr. Einar Pheasant  REFERRING PROVIDER: Dr. Einar Pheasant   END OF SESSION:   PT End of Session - 12/12/22 0846     Visit Number 15    Number of Visits 20    Date for PT Re-Evaluation 12/13/22    Authorization Type Medicare    Authorization Time Period 10/04/22-12/13/22    Authorization - Visit Number 15    Authorization - Number of Visits 20    Progress Note Due on Visit 21    PT Start Time 0843    PT Stop Time 0925    PT Time Calculation (min) 42 min    Equipment Utilized During Treatment Gait belt    Activity Tolerance Patient tolerated treatment well    Behavior During Therapy [facility] for tasks assessed/performed              Past Medical History:  Diagnosis Date   Anemia    iron deficient   Arthritis    Autoimmune hepatitis ([place]) 05/10/2016   Hep C   B12 deficiency    Barrett esophagus 01/2016   Bradycardia    Chronic kidney disease    stage III   Diverticulosis    GERD (gastroesophageal reflux disease)    Hypertension    Hypothyroidism    Ulcer disease    Past Surgical History:  Procedure Laterality Date   CATARACT EXTRACTION W/PHACO Left 08/30/2021   Procedure: CATARACT EXTRACTION PHACO AND INTRAOCULAR LENS PLACEMENT ([place]) LEFT 8.21 01:00.3;  Surgeon: Leandrew Koyanagi, MD;  Location: [place];  Service: Ophthalmology;  Laterality: Left;   CATARACT EXTRACTION W/PHACO Right 09/13/2021   Procedure: CATARACT EXTRACTION PHACO AND INTRAOCULAR LENS PLACEMENT ([place]) RIGHT;  Surgeon: Leandrew Koyanagi, MD;  Location: [place];  Service: Ophthalmology;  Laterality: Right;  8.11 00:59.8   CHOLECYSTECTOMY N/A 06/25/2018   Procedure: LAPAROSCOPIC CHOLECYSTECTOMY WITH INTRAOPERATIVE CHOLANGIOGRAM;  Surgeon: Robert Bellow,  MD;  Location: [facility];  Service: General;  Laterality: N/A;   ESOPHAGOGASTRODUODENOSCOPY (EGD) WITH PROPOFOL N/A 01/30/2016   Procedure: ESOPHAGOGASTRODUODENOSCOPY (EGD) WITH PROPOFOL;  Surgeon: Lollie Sails, MD;  Location: [facility] ENDOSCOPY;  Service: Endoscopy;  Laterality: N/A;   ESOPHAGOGASTRODUODENOSCOPY (EGD) WITH PROPOFOL N/A 01/14/2018   Procedure: ESOPHAGOGASTRODUODENOSCOPY (EGD) WITH PROPOFOL;  Surgeon: Lollie Sails, MD;  Location[facility] END[facility]COPY;  Service: Endoscopy;  Laterality: N/A;   ESOPHAGOGASTRODUODENOSCOPY (EGD) WITH PROPOFOL N/A 05/20/2018   Procedure: ESOPHAGOGASTRODUODENOSCOPY (EGD) WITH PROPOFOL;  Surgeon: Lollie Sails, MD;  Location: [facility] ENDOSCOPY;  Service: Endoscopy;  Laterality: N/A;   ESOPHAGOGASTRODUODENOSCOPY (EGD) WITH PROPOFOL N/A 01/13/2019   Procedure: ESOPHAGOGASTRODUODENOSCOPY (EGD) WITH PROPOFOL;  Surgeon: Lollie Sails, MD;  Location: [facility] ENDOSCOPY;  Service: Endoscopy;  Laterality: N/A;   HERNIA REPAIR     lap hiatal hernia repair  07/08/08   LEFT OOPHORECTOMY  1993   benign ovarian cyst   TUBAL LIGATION  1981   Patient Active Problem List   Diagnosis Date Noted   Bladder prolapse, female, acquired 05/06/2022   Hematoma 10/22/2021   Right knee pain 10/13/2021   SVT (supraventricular tachycardia) 10/07/2021   PSVT (paroxysmal supraventricular tachycardia) 10/06/2021   Numbness in feet 06/18/2021   CKD (chronic kidney disease) 06/12/2021   Dizziness 04/10/2021   Chest tightness 02/07/2021   Diarrhea 10/09/2020   Abdominal discomfort 09/27/2019   Gall bladder polyp 05/07/2018  Breast tenderness 05/07/2018   Hyperglycemia 09/10/2016   Fullness of breast 09/10/2016   Autoimmune hepatitis ([place]) 05/10/2016   MGUS (monoclonal gammopathy of unknown significance) 06/06/2015   LUQ fullness 06/06/2015   Health care maintenance 06/06/2015   Fatty tumor 03/07/2015   BMI 32.0-32.9,adult 01/02/2015   Abnormal liver function tests  01/02/2015   Hand cramps 11/15/2014   Leg cramps 06/13/2014   Osteopenia 03/02/2013   Hyperbilirubinemia 03/02/2013   Hypertension 12/22/2012   Gastroparesis 12/22/2012   Barrett's esophagus with esophagitis 12/22/2012   Hypothyroidism 12/22/2012   Anemia 12/22/2012   B12 deficiency 12/22/2012   CKD (chronic kidney disease) stage 3, GFR 30-59 ml/min (HCC) 12/22/2012    REFERRING DIAG: M25.561 (ICD-10-CM) - Right knee pain, unspecified chronicity  THERAPY DIAG:  Muscle weakness (generalized)  Difficulty in walking, not elsewhere classified  Rationale for Evaluation and Treatment Rehabilitation  PERTINENT HISTORY: Pt was initially sent her by PCP for right knee pain from a patella fracture that she sustained back in February.  She occasionally feels pain in her left knee, but she does not identify this as a problem. Pt describes feeling like she is walking stiff where she is having to lift the right leg upstairs.  She feels afraid of falling and she walks slower as a result. She does not normally use a SPC with the exception of shopping in a grocery store.   PRECAUTIONS: Falls   SUBJECTIVE: Pt reports ache in R knee. Reports her gait has been improving with PT for a couple days then she goes back to her typical walking pattern. Has questions why her gait is better in socks compared to her slip on shoes.   PAIN:  Are you having pain? No   OBJECTIVE:                 BP 180/54 HR 58 SpO2 100      DIAGNOSTIC FINDINGS: CLINICAL DATA:  Right knee pain, fall   EXAM: RIGHT KNEE - 1-2 VIEW   COMPARISON:  None.   FINDINGS: Advanced tricompartment degenerative changes with joint space narrowing and spurring. Moderate joint effusion. No acute bony abnormality. Specifically, no fracture, subluxation, or dislocation.   IMPRESSION: No acute bony abnormality.  Degenerative changes and joint effusion.     Electronically Signed   By: Rolm Baptise M.D.   On: 10/16/2021 20:51    PATIENT SURVEYS:  FOTO 45/100 with target of 52    COGNITION:           Overall cognitive status: Within functional limits for tasks assessed                          SENSATION: WFL       MUSCLE LENGTH: Hamstrings: Right 60 deg; Left 60 deg     POSTURE: rounded shoulders and forward head   PALPATION: TTP of anterior surface of bilateral knees    LOWER EXTREMITY ROM:     Active /PROM Right 10/04/2022 Left 10/04/2022  Hip flexion 120 120  Hip extension 30 30  Hip abduction 45 45  Hip adduction 30 30  Hip internal rotation 45 45  Hip external rotation 45 45  Knee flexion 110/120 110/120  Knee extension 0 0  Ankle dorsiflexion      Ankle plantarflexion      Ankle inversion      Ankle eversion       (Blank rows = not tested)  LOWER EXTREMITY MMT:   MMT Right eval Left eval  Hip flexion 4 4  Hip extension 4- 4-  Hip abduction 4- 4-  Hip adduction 3+ 3+  Hip internal rotation      Hip external rotation      Knee flexion 4 4  Knee extension 4 4  Ankle dorsiflexion      Ankle plantarflexion      Ankle inversion      Ankle eversion       (Blank rows = not tested)   LOWER EXTREMITY SPECIAL TESTS:  Hip special tests: Marcello Moores test: negative and Ely's test: positive    FUNCTIONAL TESTS:  5 times sit to stand: 19 sec  30 seconds chair stand test: 9 reps  Dynamic Gait Index: NT    GAIT: Distance walked: 10 meters  Assistive device utilized: None Level of assistance: Complete Independence Comments: Decreased step length on RLE        TODAY'S TREATMENT:  There.ex:  12/12/22:  Nu-Step with seat and arms at 6 for 5 min. L4 resistance.   Gait 100' with shoe wear then 100' in socks. Pt reporting difficulty ambulating in her slip on shoes compared to gait in socks at home. In slip on shoes decreased gait speed noted with consistent foot movement in shoe due to limited heel support leading to some mild circumduction at hip to clear her feet in swing  phase. With socks pt improves gait speed and hip flexion/ankle dorsiflexion during swing phase compared to circumduction. Educated pt on benefits of lace up shoe wear with heel support to assist in gait.   4" step ups with RUE support leading with RLE for concentric and eccentric portions. 2x12   4" lateral steps with light BUE support for reduced falls risk. 1x12/direction   Seated hamstring stretch: 3x30 sec on RLE propped on 4" step.   Standing R gastroc stretch on wall: 2x30 sec   Gait 100' post stretching for pt response to improving knee flexibility. Mild improvement in gait reported from pt.   OMEGA Leg press BLE: 2x8, 45#  RLE only: 2x6, 20#. Initial minA for lift off of platform then able to complete.      PATIENT EDUCATION:  Education details: form and education for appropriate exercise and explanation of tests and measures  Person educated: Patient Education method: Explanation, Demonstration, Verbal cues, and Handouts Education comprehension: verbalized understanding, returned demonstration, and verbal cues required     HOME EXERCISE PROGRAM: Access Code: [email or web address] URL: [email or web address] Date: 11/08/2022 Prepared by: Bradly Chris  Exercises - Sit to Stand  - 3 x weekly - 3 sets - 10 reps - Prone Quadriceps Stretch with Strap  - 1 x daily - 3 reps - 30-60 sec  hold - Standing March  - 3 x weekly - 3 sets - 10 reps - Single Leg Heel Raise with Counter Support  - 3 x weekly - 3 sets - 10 reps - Side Lunge Adductor Stretch  - 1 x daily - 3 reps - 30 hold - Seated Table Hamstring Stretch  - 1 x daily - 3 reps - 30-60 sec hold - Lateral Step Up with Counter Support  - 3 x weekly - 3 sets - 10 reps - Step Up  - 3 x weekly - 3 sets - 10 reps - Sidelying Hip Adduction  - 3 x weekly - 3 sets - 10 reps - Sitting Knee Extension with Resistance  - 3 x weekly - 3 sets -  10 reps     ASSESSMENT:   CLINICAL IMPRESSION: Discussion today on gait mechanics  and varying shoe wear and how it affects gait pattern. Encouraged pt to find shoes with heel support to assist in foot clearance and heel strike. Otherwise maintaining PT POC focusing on RLE strengthening to assist in gait and stair navigation.  Pt will continue to benefit from skilled PT services to address remaining strength deficits to improve stair navigation.   OBJECTIVE IMPAIRMENTS Abnormal gait, decreased balance, decreased ROM, and decreased strength.    ACTIVITY LIMITATIONS squatting, stairs, and locomotion level   PARTICIPATION LIMITATIONS: shopping, community activity, and yard work   [place] Age and 3+ comorbidities: chronic kidney disease, HTN, and h/o right knee patella fracture   are also affecting patient's functional outcome.    REHAB POTENTIAL: Good   CLINICAL DECISION MAKING: Stable/uncomplicated   EVALUATION COMPLEXITY: Low     GOALS: Goals reviewed with patient? No   SHORT TERM GOALS: Target date: 10/18/2022  Pt will be independent with HEP in order to improve strength and balance in order to decrease fall risk and improve function at home and work. Baseline: Performing independently  Goal status: Ongoing    2.  Pt will decrease 5TSTS by at least 3 seconds in order to demonstrate clinically significant improvement in LE strength.  Baseline: 9 sec  Goal status: Ongoing    3.  Pt will increase 10MWT by at least 0.13 m/s in order to demonstrate clinically significant improvement in community ambulation. Baseline: 0.50 m/sec  11/06/22: 0.65 m/sec  Goal status: Achieved        LONG TERM GOALS: Target date: 12/13/2022    Patient will have improved function and activity level as evidenced by an increase in FOTO score by 10 points or more. Baseline: 43/100 with target of 52  Goal status: Ongoing    2.  Patient will improve hip and knee strength by 1/3 MMT (I.e 4- to 4) for improved hip stability to improve steadiness of gait to decrease risk of falling.   Baseline: Hip Adduction R/L 3+/3+, Hip Abduction R/L 4-/4-, Hip Ext R/L  4-/-, Knee Flex R/L 4/4, Knee Ext R/L4/4 11/06/22: Hip Adduction R/L 4-/4-, Hip Abduction R/L 4/4, Hip Ext R/L  4/4, Knee Flex R/L 5/5, Knee Ext R/L 5/5 Goal status: Achieved    3.  Patient will demonstrate reduced falls risk as evidenced by Dynamic Gait Index (DGI) >19/24. Baseline:15/24  11/06/22: 19/24 Goal status: Partially Met     4.  Patient will improve 5 x STS to 11 sec to demonstrate improved LE strength that places her at a decreased risk for falls. Baseline: 19 sec   11/06/22: 24 sec  Goal status: Ongoing    5.  Patient will demonstrate a gait speed > 1.0 m/sec to show that she capable of a gait speed that places her at a decreased risk for falls.  Baseline: 0.50 m/sec 11/06/22: 0.65 m/sec  Goal status: Partially met    6.  Patient will perform 8>= sit to stands within 30 sec to show LE endurance that places her at a decreased risk for falls.  Baseline: 9 reps   11/06/22: 6 reps  Goal status: Partially met      PLAN: PT FREQUENCY: 1-2x/week   PT DURATION: 10 weeks   PLANNED INTERVENTIONS: Therapeutic exercises, Neuromuscular re-education, Balance training, Gait training, Joint mobilization, Joint manipulation, Stair training, DME instructions, Aquatic Therapy, Dry Needling, Spinal manipulation, Spinal mobilization, Cryotherapy, Moist heat,  Manual therapy, and Re-evaluation   PLAN FOR NEXT SESSION: Is responding well with single leg strengthening. Assess R knee hamstring and gastroc length?   Salem Caster. Fairly IV, PT, DPT Physical Therapist- [facility]  12/12/2022, 10:26 AM

## 2022-12-13 ENCOUNTER — Ambulatory Visit (INDEPENDENT_AMBULATORY_CARE_PROVIDER_SITE_OTHER): Payer: Medicare Other

## 2022-12-13 DIAGNOSIS — E538 Deficiency of other specified B group vitamins: Secondary | ICD-10-CM | POA: Diagnosis not present

## 2022-12-13 MED ORDER — CYANOCOBALAMIN 1000 MCG/ML IJ SOLN
1000.0000 ug | Freq: Once | INTRAMUSCULAR | Status: AC
  Administered 2022-12-13: 1000 ug via INTRAMUSCULAR

## 2022-12-13 NOTE — Progress Notes (Signed)
Pt presented for their vitamin B12 injection. Pt was identified through two identifiers. Pt tolerated shot well in their right deltoid.  

## 2022-12-14 ENCOUNTER — Other Ambulatory Visit: Payer: Self-pay | Admitting: Internal Medicine

## 2022-12-14 DIAGNOSIS — R252 Cramp and spasm: Secondary | ICD-10-CM

## 2022-12-17 NOTE — Telephone Encounter (Signed)
Okay to fill Parafon patient has an upcoming appt with Provider on 12/18/22.

## 2022-12-18 ENCOUNTER — Encounter: Payer: Self-pay | Admitting: Internal Medicine

## 2022-12-18 ENCOUNTER — Ambulatory Visit (INDEPENDENT_AMBULATORY_CARE_PROVIDER_SITE_OTHER): Payer: Medicare Other | Admitting: Internal Medicine

## 2022-12-18 VITALS — BP 120/70 | HR 53 | Temp 98.1°F | Resp 15 | Ht 61.0 in | Wt 143.6 lb

## 2022-12-18 DIAGNOSIS — I10 Essential (primary) hypertension: Secondary | ICD-10-CM

## 2022-12-18 DIAGNOSIS — I471 Supraventricular tachycardia, unspecified: Secondary | ICD-10-CM

## 2022-12-18 DIAGNOSIS — N1831 Chronic kidney disease, stage 3a: Secondary | ICD-10-CM

## 2022-12-18 DIAGNOSIS — R252 Cramp and spasm: Secondary | ICD-10-CM

## 2022-12-18 DIAGNOSIS — K754 Autoimmune hepatitis: Secondary | ICD-10-CM | POA: Diagnosis not present

## 2022-12-18 DIAGNOSIS — K2270 Barrett's esophagus without dysplasia: Secondary | ICD-10-CM

## 2022-12-18 DIAGNOSIS — K209 Esophagitis, unspecified without bleeding: Secondary | ICD-10-CM

## 2022-12-18 DIAGNOSIS — K824 Cholesterolosis of gallbladder: Secondary | ICD-10-CM

## 2022-12-18 DIAGNOSIS — R7989 Other specified abnormal findings of blood chemistry: Secondary | ICD-10-CM | POA: Diagnosis not present

## 2022-12-18 DIAGNOSIS — D649 Anemia, unspecified: Secondary | ICD-10-CM | POA: Diagnosis not present

## 2022-12-18 DIAGNOSIS — R739 Hyperglycemia, unspecified: Secondary | ICD-10-CM | POA: Diagnosis not present

## 2022-12-18 DIAGNOSIS — E039 Hypothyroidism, unspecified: Secondary | ICD-10-CM

## 2022-12-18 DIAGNOSIS — M25561 Pain in right knee: Secondary | ICD-10-CM

## 2022-12-18 LAB — LIPID PANEL
Cholesterol: 157 mg/dL (ref 0–200)
HDL: 50.3 mg/dL (ref >39.00)
LDL Cholesterol: 86 mg/dL (ref 0–99)
NonHDL: 106.81
Total CHOL/HDL Ratio: 3
Triglycerides: 104 mg/dL (ref 0.0–149.0)
VLDL: 20.8 mg/dL (ref 0.0–40.0)

## 2022-12-18 LAB — CBC WITH DIFFERENTIAL/PLATELET
Basophils Absolute: 0.1 10*3/uL (ref 0.0–0.1)
Basophils Relative: 1.3 % (ref 0.0–3.0)
Eosinophils Absolute: 0.1 10*3/uL (ref 0.0–0.7)
Eosinophils Relative: 1.8 % (ref 0.0–5.0)
HCT: 36.1 % (ref 36.0–46.0)
Hemoglobin: 12.1 g/dL (ref 12.0–15.0)
Lymphocytes Relative: 15.6 % (ref 12.0–46.0)
Lymphs Abs: 1 10*3/uL (ref 0.7–4.0)
MCHC: 33.5 g/dL (ref 30.0–36.0)
MCV: 90.6 fl (ref 78.0–100.0)
Monocytes Absolute: 0.7 10*3/uL (ref 0.1–1.0)
Monocytes Relative: 10.5 % (ref 3.0–12.0)
Neutro Abs: 4.4 10*3/uL (ref 1.4–7.7)
Neutrophils Relative %: 70.8 % (ref 43.0–77.0)
Platelets: 212 10*3/uL (ref 150.0–400.0)
RBC: 3.98 Mil/uL (ref 3.87–5.11)
RDW: 13.1 % (ref 11.5–15.5)
WBC: 6.3 10*3/uL (ref 4.0–10.5)

## 2022-12-18 LAB — HEMOGLOBIN A1C: Hgb A1c MFr Bld: 5.8 % (ref 4.6–6.5)

## 2022-12-18 LAB — BASIC METABOLIC PANEL
BUN: 30 mg/dL — ABNORMAL HIGH (ref 6–23)
CO2: 27 mEq/L (ref 19–32)
Calcium: 9.8 mg/dL (ref 8.4–10.5)
Chloride: 103 mEq/L (ref 96–112)
Creatinine, Ser: 0.97 mg/dL (ref 0.40–1.20)
GFR: 53.02 mL/min — ABNORMAL LOW (ref >60.00)
Glucose, Bld: 93 mg/dL (ref 70–99)
Potassium: 4.7 mEq/L (ref 3.5–5.1)
Sodium: 138 mEq/L (ref 135–145)

## 2022-12-18 LAB — HEPATIC FUNCTION PANEL
ALT: 9 U/L (ref 0–35)
AST: 14 U/L (ref 0–37)
Albumin: 4.2 g/dL (ref 3.5–5.2)
Alkaline Phosphatase: 47 U/L (ref 39–117)
Bilirubin, Direct: 0.1 mg/dL (ref 0.0–0.3)
Total Bilirubin: 0.8 mg/dL (ref 0.2–1.2)
Total Protein: 7.1 g/dL (ref 6.0–8.3)

## 2022-12-18 MED ORDER — LEVOTHYROXINE SODIUM 88 MCG PO TABS: 88.0000 ug | ORAL_TABLET | Freq: Every day | ORAL | 1 refills | Status: DC

## 2022-12-18 MED ORDER — CHLORZOXAZONE 500 MG PO TABS: ORAL_TABLET | ORAL | 0 refills | Status: DC

## 2022-12-18 NOTE — Telephone Encounter (Signed)
Need to clarify how often she is taking.  Is supposed to be as needed and is not supposed to take with ambien.  Per review, just had refilled 11/05/22.  Is she having problems?  Needing more often?

## 2022-12-18 NOTE — Progress Notes (Signed)
Patient ID: Lori [name], female   DOB: 30-Oct-1934, 88 y.o.   MRN: [id number]   Subjective:    Patient ID: Lori [name], female    DOB: [birth year], 88 y.o.   MRN: [id number]   Patient here for  Chief Complaint  Patient presents with   Medical Management of Chronic Issues   Hypertension   Hypothyroidism   .   HPI Here to follow up regarding her blood pressure and bradycardia/SVT.  Marland Kitchen The patient was previously admitted to [facility] for "spells" with associated palpitations and syncope, noted to be in paroxysmal SVT, discharged on sotalol without recurrence. Holter monitor revealed predominant sinus bradycardia with a mean heart rate of 56 bpm. Sotalol was reduced to 40 mg once daily due to bradycardia,Saw cardiology 08/28/22.  Felt stable.  States she is doing relatively well.  She is having issues with her right knee.  Previous xray 09-2021 - degenerative changes and joint effusion.  Saw ortho - treated for right patella transverse nondisplaced fracture - s/p fall 02/02/22. Continues to have pain and discussed referral to PT. Has been  going to PT. Blood pressure doing well. Still with intermittent flares with her bowels.  Some cramping.  Has seen GI.  Has f/u in January.    Past Medical History:  Diagnosis Date   Anemia    iron deficient   Arthritis    Autoimmune hepatitis ([place]) 05/10/2016   Hep C   B12 deficiency    Barrett esophagus 01/2016   Bradycardia    Chronic kidney disease    stage III   Diverticulosis    GERD (gastroesophageal reflux disease)    Hypertension    Hypothyroidism    Ulcer disease    Past Surgical History:  Procedure Laterality Date   CATARACT EXTRACTION W/PHACO Left 08/30/2021   Procedure: CATARACT EXTRACTION PHACO AND INTRAOCULAR LENS PLACEMENT ([place]) LEFT 8.21 01:00.3;  Surgeon: Leandrew Koyanagi, MD;  Location: [place];  Service: Ophthalmology;  Laterality: Left;   CATARACT EXTRACTION W/PHACO Right 09/13/2021   Procedure: CATARACT  EXTRACTION PHACO AND INTRAOCULAR LENS PLACEMENT ([place]) RIGHT;  Surgeon: Leandrew Koyanagi, MD;  Location: [place];  Service: Ophthalmology;  Laterality: Right;  8.11 00:59.8   CHOLECYSTECTOMY N/A 06/25/2018   Procedure: LAPAROSCOPIC CHOLECYSTECTOMY WITH INTRAOPERATIVE CHOLANGIOGRAM;  Surgeon: Robert Bellow, MD;  Location: [facility];  Service: General;  Laterality: N/A;   ESOPHAGOGASTRODUODENOSCOPY (EGD) WITH PROPOFOL N/A 01/30/2016   Procedure: ESOPHAGOGASTRODUODENOSCOPY (EGD) WITH PROPOFOL;  Surgeon: Lollie Sails, MD;  Location: [facility] ENDOSCOPY;  Service: Endoscopy;  Laterality: N/A;   ESOPHAGOGASTRODUODENOSCOPY (EGD) WITH PROPOFOL N/A 01/14/2018   Procedure: ESOPHAGOGASTRODUODENOSCOPY (EGD) WITH PROPOFOL;  Surgeon: Lollie Sails, MD;  Location: [facility] ENDOSCOPY;  Service: Endoscopy;  Laterality: N/A;   ESOPHAGOGASTRODUODENOSCOPY (EGD) WITH PROPOFOL N/A 05/20/2018   Procedure: ESOPHAGOGASTRODUODENOSCOPY (EGD) WITH PROPOFOL;  Surgeon: Lollie Sails, MD;  Location: [facility] ENDOSCOPY;  Service: Endoscopy;  Laterality: N/A;   ESOPHAGOGASTRODUODENOSCOPY (EGD) WITH PROPOFOL N/A 01/13/2019   Procedure: ESOPHAGOGASTRODUODENOSCOPY (EGD) WITH PROPOFOL;  Surgeon: Lollie Sails, MD;  Location: [facility]COPY;  Service: Endoscopy;  Laterality: N/A;   HERNIA REPAIR     lap hiatal hernia repair  07/08/08   LEFT OOPHORECTOMY  1993   benign ovarian cyst   TUBAL LIGATION  1981   Family History  Problem Relation Age of Onset   Stroke Mother    Breast cancer Neg Hx    Colon cancer Neg Hx    Social History   Socioeconomic  History   Marital status: Widowed    Spouse name: Not on file   Number of children: 0   Years of education: Not on file   Highest education level: Not on file  Occupational History   Not on file  Tobacco Use   Smoking status: Never   Smokeless tobacco: Never  Vaping Use   Vaping Use: Never used  Substance and Sexual Activity   Alcohol use: No     Alcohol/week: 0.0 standard drinks of alcohol   Drug use: No   Sexual activity: Never  Other Topics Concern   Not on file  Social History Narrative   Not on file   Social Determinants of Health   Financial Resource Strain: Low Risk  (06/19/2022)   Overall Financial Resource Strain (CARDIA)    Difficulty of Paying Living Expenses: Not hard at all  Food Insecurity: No Food Insecurity (06/19/2022)   Hunger Vital Sign    Worried About Running Out of Food in the Last Year: Never true    [place] in the Last Year: Never true  Transportation Needs: No Transportation Needs (06/19/2022)   PRAPARE - Hydrologist (Medical): No    Lack of Transportation (Non-Medical): No  Physical Activity: Insufficiently Active (06/16/2021)   Exercise Vital Sign    Days of Exercise per Week: 3 days    Minutes of Exercise per Session: 20 min  Stress: No Stress Concern Present (06/16/2021)   [employer]    Feeling of Stress : Not at all  Social Connections: Unknown (06/19/2022)   Social Connection and Isolation Panel [NHANES]    Frequency of Communication with Friends and Family: More than three times a week    Frequency of Social Gatherings with Friends and Family: More than three times a week    Attends Religious Services: Not on Advertising copywriter or Organizations: Not on file    Attends Archivist Meetings: Not on file    Marital Status: Not on file     Review of Systems  Constitutional:  Negative for appetite change and unexpected weight change.  HENT:  Negative for congestion and sinus pressure.   Respiratory:  Negative for cough, chest tightness and shortness of breath.   Cardiovascular:  Negative for chest pain, palpitations and leg swelling.  Gastrointestinal:  Negative for abdominal pain, nausea and vomiting.       Intermittent bowel flares as outlined.    Genitourinary:   Negative for difficulty urinating and dysuria.  Musculoskeletal:  Negative for myalgias.       Right knee pain as outlined.   Skin:  Negative for color change and rash.  Neurological:  Negative for dizziness, light-headedness and headaches.  Psychiatric/Behavioral:  Negative for agitation and dysphoric mood.        Objective:     BP 120/70 (BP Location: Left Arm, Patient Position: Sitting, Cuff Size: Small)   Pulse (!) 53   Temp 98.1 F (36.7 C) (Temporal)   Resp 15   Ht _0  (1.549 m)   Wt 143 lb 9.6 oz (65.1 kg)   SpO2 99%   BMI 27.13 kg/m  Wt Readings from Last 3 Encounters:  12/18/22 143 lb 9.6 oz (65.1 kg)  08/28/22 142 lb (64.4 kg)  06/19/22 142 lb (64.4 kg)    Physical Exam Vitals reviewed.  Constitutional:      General: She  is not in acute distress.    Appearance: Normal appearance.  HENT:     Head: Normocephalic and atraumatic.     Right Ear: External ear normal.     Left Ear: External ear normal.  Eyes:     General: No scleral icterus.       Right eye: No discharge.        Left eye: No discharge.     Conjunctiva/sclera: Conjunctivae normal.  Neck:     Thyroid: No thyromegaly.  Cardiovascular:     Rate and Rhythm: Normal rate and regular rhythm.  Pulmonary:     Effort: No respiratory distress.     Breath sounds: Normal breath sounds. No wheezing.  Abdominal:     General: Bowel sounds are normal.     Palpations: Abdomen is soft.     Tenderness: There is no abdominal tenderness.  Musculoskeletal:        General: No swelling or tenderness.     Cervical back: Neck supple. No tenderness.  Lymphadenopathy:     Cervical: No cervical adenopathy.  Skin:    Findings: No erythema or rash.  Neurological:     Mental Status: She is alert.  Psychiatric:        Mood and Affect: Mood normal.        Behavior: Behavior normal.      Outpatient Encounter Medications as of 12/18/2022  Medication Sig   aspirin 81 MG tablet Take 81 mg by mouth daily.    benazepril (LOTENSIN) 20 MG tablet Take 1 tablet by mouth 2 (two) times daily.   calcitonin, salmon, (MIACALCIN/FORTICAL) 200 UNIT/ACT nasal spray ONE SPRAY ALTERNATING NOSTRILS EVERY OTHER DAY   Calcium-Phosphorus-Vitamin D (CITRACAL +D3 PO) Take 1 tablet by mouth daily.   Cyanocobalamin (B-12) 1000 MCG/ML KIT Inject 1,000 mcg as directed every 30 (thirty) days.    GLUCOSAMINE-CHONDROITIN PO Take 1 tablet by mouth 2 (two) times daily.    Magnesium Oxide 400 (240 Mg) MG TABS Take 1 tablet (400 mg total) by mouth 2 (two) times daily.   Mesalamine (ASACOL) 400 MG CPDR DR capsule Take 400 mg by mouth 2 (two) times daily.    pantoprazole (PROTONIX) 40 MG tablet Take 40 mg by mouth daily.   sotalol (BETAPACE) 80 MG tablet Take 0.5 mg by mouth daily.   Wheat Dextrin (BENEFIBER DRINK MIX) PACK Take 1 each by mouth daily.    zolpidem (AMBIEN CR) 6.25 MG CR tablet TAKE 1 TABLET (6.25 MG TOTAL) BY MOUTH AT BEDTIME AS NEEDED FOR SLEEP.   [DISCONTINUED] chlorzoxazone (PARAFON) 500 MG tablet TAKE 1 TABLET BY MOUTH EVERY DAY AT BEDTIME AS NEEDED. DO NOT TAKE WITH AMBIEN.   [DISCONTINUED] levothyroxine (SYNTHROID) 88 MCG tablet TAKE 1 TABLET BY MOUTH EVERY DAY   chlorzoxazone (PARAFON) 500 MG tablet TAKE 1 TABLET BY MOUTH EVERY DAY AT BEDTIME AS NEEDED. DO NOT TAKE WITH AMBIEN.   levothyroxine (SYNTHROID) 88 MCG tablet Take 1 tablet (88 mcg total) by mouth daily.   No facility-administered encounter medications on file as of 12/18/2022.     Lab Results  Component Value Date   WBC 6.3 12/18/2022   HGB 12.1 12/18/2022   HCT 36.1 12/18/2022   PLT 212.0 12/18/2022   GLUCOSE 93 12/18/2022   CHOL 157 12/18/2022   TRIG 104.0 12/18/2022   HDL 50.30 12/18/2022   LDLCALC 86 12/18/2022   ALT 9 12/18/2022   AST 14 12/18/2022   NA 138 12/18/2022   K 4.7 12/18/2022  CL 103 12/18/2022   CREATININE 0.97 12/18/2022   BUN 30 (H) 12/18/2022   CO2 27 12/18/2022   TSH 1.05 08/28/2022   INR 1.0 06/07/2020    HGBA1C 5.8 12/18/2022    MM 3D SCREEN BREAST BILATERAL  Result Date: 07/31/2022 CLINICAL DATA:  Screening. EXAM: DIGITAL SCREENING BILATERAL MAMMOGRAM WITH TOMOSYNTHESIS AND CAD TECHNIQUE: Bilateral screening digital craniocaudal and mediolateral oblique mammograms were obtained. Bilateral screening digital breast tomosynthesis was performed. The images were evaluated with computer-aided detection. COMPARISON:  Previous exam(s). ACR Breast Density Category b: There are scattered areas of fibroglandular density. FINDINGS: There are no findings suspicious for malignancy. IMPRESSION: No mammographic evidence of malignancy. A result letter of this screening mammogram will be mailed directly to the patient. RECOMMENDATION: Screening mammogram in one year. (Code:SM-B-01Y) BI-RADS CATEGORY  1: Negative. Electronically Signed   By: Nolon Nations M.D.   On: 07/31/2022 14:07       Assessment & Plan:   Problem List Items Addressed This Visit     Abnormal liver function tests    Followed by GI.  Stable.  Follow liver function tests.       Anemia    Follow cbc.       Autoimmune hepatitis ([place])    Followed by GI.  Follow liver function tests.       Barrett's esophagus with esophagitis    Upper symptoms controlled.  Continues protonix.  EGD 12/2018.  Recommended f/u EGD in 2 years.  Saw GI.  Last seen 02/2022 - recommended hold f/u EGD.        CKD (chronic kidney disease) stage 3, GFR 30-59 ml/min (HCC)    Avoid antiinflammatories.  Stay hydrated.  Follow metabolic panel.  Check metabolic panel.        Gall bladder polyp    S/p cholecystectomy.        Hyperglycemia    Low carb diet and exercise.  Follow met b and a1c.       Relevant Orders   Hemoglobin A1c (Completed)   Hypertension - Primary    On benazepril 56m bid.  On sotalol 427mq day.   Also cardiology started her on hctz. Blood pressure doing well.  Continue current medications.  Follow metabolic panel.       Relevant Orders    CBC with Differential/Platelet (Completed)   Hepatic function panel (Completed)   Basic metabolic panel (Completed)   Lipid panel (Completed)   Hypothyroidism    On thyroid replacement.  Follow tsh.       Relevant Medications   levothyroxine (SYNTHROID) 88 MCG tablet   Leg cramps   Relevant Medications   chlorzoxazone (PARAFON) 500 MG tablet   PSVT (paroxysmal supraventricular tachycardia)    Continues on sotalol.  Stable.        Right knee pain    Has seen ortho.  Seeing PT.  Follow.       SVT (supraventricular tachycardia)    Continue sotalol.  No increased heart rate or palpitations reported currently.        ChEinar PheasantMD

## 2022-12-20 ENCOUNTER — Ambulatory Visit: Payer: Medicare Other | Admitting: Physical Therapy

## 2022-12-20 DIAGNOSIS — M6281 Muscle weakness (generalized): Secondary | ICD-10-CM

## 2022-12-20 DIAGNOSIS — R262 Difficulty in walking, not elsewhere classified: Secondary | ICD-10-CM | POA: Diagnosis not present

## 2022-12-20 NOTE — Therapy (Addendum)
OUTPATIENT PHYSICAL THERAPY TREATMENT NOTE   Patient Name: Lori [name] MRN: [id number] DOB:[birth year], 88 y.o., female Today's Date: 12/20/2022  PCP: Dr. Einar Pheasant  REFERRING PROVIDER: Dr. Einar Pheasant   END OF SESSION:     12/20/22 1056  PT Visits / Re-Eval  Visit Number 16  Number of Visits 20  Date for PT Re-Evaluation 02/14/23  Authorization  Authorization Type Medicare  Authorization Time Period 12/14/22-02/14/23  Authorization - Visit Number 16  Authorization - Number of Visits 20  Progress Note Due on Visit 20  PT Time Calculation  PT Start Time 1015  PT Stop Time 1100  PT Time Calculation (min) 45 min  PT - End of Session  Equipment Utilized During Treatment Gait belt  Activity Tolerance Patient tolerated treatment well  Behavior During Therapy WFL for tasks assessed/performed        Past Medical History:  Diagnosis Date   Anemia    iron deficient   Arthritis    Autoimmune hepatitis ([place]) 05/10/2016   Hep C   B12 deficiency    Barrett esophagus 01/2016   Bradycardia    Chronic kidney disease    stage III   Diverticulosis    GERD (gastroesophageal reflux disease)    Hypertension    Hypothyroidism    Ulcer disease    Past Surgical History:  Procedure Laterality Date   CATARACT EXTRACTION W/PHACO Left 08/30/2021   Procedure: CATARACT EXTRACTION PHACO AND INTRAOCULAR LENS PLACEMENT ([place]) LEFT 8.21 01:00.3;  Surgeon: Leandrew Koyanagi, MD;  Location: [facility];  Service: Ophthalmology;  Laterality: Left;   CATARACT EXTRACTION W/PHACO Right 09/13/2021   Procedure: CATARACT EXTRACTION PHACO AND INTRAOCULAR LENS PLACEMENT ([place]) RIGHT;  Surgeon: Leandrew Koyanagi, MD;  Location: [place];  Service: Ophthalmology;  Laterality: Right;  8.11 00:59.8   CHOLECYSTECTOMY N/A 06/25/2018   Procedure: LAPAROSCOPIC CHOLECYSTECTOMY WITH INTRAOPERATIVE CHOLANGIOGRAM;  Surgeon: Robert Bellow, MD;  Location: [facility];   Service: General;  Laterality: N/A;   ESOPHAGOGASTRODUODENOSCOPY (EGD) WITH PROPOFOL N/A 01/30/2016   Procedure: ESOPHAGOGASTRODUODENOSCOPY (EGD) WITH PROPOFOL;  Surgeon: Lollie Sails, MD;  Location: [facility] ENDOSCOPY;  Service: Endoscopy;  Laterality: N/A;   ESOPHAGOGASTRODUODENOSCOPY (EGD) WITH PROPOFOL N/A 01/14/2018   Procedure: ESOPHAGOGASTRODUODENOSCOPY (EGD) WITH PROPOFOL;  Surgeon: Lollie Sails, MD;  Location: [facility] ENDOSCOPY;  Service: Endoscopy;  Laterality: N/A;   ESOPHAGOGASTRODUODENOSCOPY (EGD) WITH PROPOFOL N/A 05/20/2018   Procedure: ESOPHAGOGASTRODUODENOSCOPY (EGD) WITH PROPOFOL;  Surgeon: Lollie Sails, MD;  Location: [facility] ENDOSCOPY;  Service: Endoscopy;  Laterality: N/A;   ESOPHAGOGASTRODUODENOSCOPY (EGD) WITH PROPOFOL N/A 01/13/2019   Procedure: ESOPHAGOGASTRODUODENOSCOPY (EGD) WITH PROPOFOL;  Surgeon: Lollie Sails, MD;  Location: [facility] ENDOSCOPY;  Service: Endoscopy;  Laterality: N/A;   HERNIA REPAIR     lap hiatal hernia repair  07/08/08   LEFT OOPHORECTOMY  1993   benign ovarian cyst   TUBAL LIGATION  1981   Patient Active Problem List   Diagnosis Date Noted   Bladder prolapse, female, acquired 05/06/2022   Hematoma 10/22/2021   Right knee pain 10/13/2021   SVT (supraventricular tachycardia) 10/07/2021   PSVT (paroxysmal supraventricular tachycardia) 10/06/2021   Numbness in feet 06/18/2021   CKD (chronic kidney disease) 06/12/2021   Dizziness 04/10/2021   Chest tightness 02/07/2021   Diarrhea 10/09/2020   Abdominal discomfort 09/27/2019   Gall bladder polyp 05/07/2018   Breast tenderness 05/07/2018   Hyperglycemia 09/10/2016   Fullness of breast 09/10/2016   Autoimmune hepatitis ([place]) 05/10/2016   MGUS (monoclonal gammopathy of  unknown significance) 06/06/2015   LUQ fullness 06/06/2015   Health care maintenance 06/06/2015   Fatty tumor 03/07/2015   BMI 32.0-32.9,adult 01/02/2015   Abnormal liver function tests 01/02/2015   Hand cramps  11/15/2014   Leg cramps 06/13/2014   Osteopenia 03/02/2013   Hyperbilirubinemia 03/02/2013   Hypertension 12/22/2012   Gastroparesis 12/22/2012   Barrett's esophagus with esophagitis 12/22/2012   Hypothyroidism 12/22/2012   Anemia 12/22/2012   B12 deficiency 12/22/2012   CKD (chronic kidney disease) stage 3, GFR 30-59 ml/min ([facility]) 12/22/2012    REFERRING DIAG: M25.561 (ICD-10-CM) - Right knee pain, unspecified chronicity  THERAPY DIAG:  Muscle weakness (generalized)  Difficulty in walking, not elsewhere classified  Rationale for Evaluation and Treatment Rehabilitation  PERTINENT HISTORY: Pt was initially sent her by PCP for right knee pain from a patella fracture that she sustained back in February.  She occasionally feels pain in her left knee, but she does not identify this as a problem. Pt describes feeling like she is walking stiff where she is having to lift the right leg upstairs.  She feels afraid of falling and she walks slower as a result. She does not normally use a SPC with the exception of shopping in a grocery store.   PRECAUTIONS: Falls   SUBJECTIVE: Pt reports increased cramping in her left quad and she is not sure why. She continues to have difficulty with stepping up with right leg. She is experiencing increased right knee pain as of late because of increased cold weather.    PAIN:  Are you having pain? No   OBJECTIVE:                 BP 180/54 HR 58 SpO2 100      DIAGNOSTIC FINDINGS: CLINICAL DATA:  Right knee pain, fall   EXAM: RIGHT KNEE - 1-2 VIEW   COMPARISON:  None.   FINDINGS: Advanced tricompartment degenerative changes with joint space narrowing and spurring. Moderate joint effusion. No acute bony abnormality. Specifically, no fracture, subluxation, or dislocation.   IMPRESSION: No acute bony abnormality.  Degenerative changes and joint effusion.     Electronically Signed   By: Rolm Baptise M.D.   On: 10/16/2021 20:51   PATIENT  SURVEYS:  FOTO 45/100 with target of 52    COGNITION:           Overall cognitive status: Within functional limits for tasks assessed                          SENSATION: WFL       MUSCLE LENGTH: Hamstrings: Right 60 deg; Left 60 deg     POSTURE: rounded shoulders and forward head   PALPATION: TTP of anterior surface of bilateral knees    LOWER EXTREMITY ROM:     Active /PROM Right 10/04/2022 Left 10/04/2022  Hip flexion 120 120  Hip extension 30 30  Hip abduction 45 45  Hip adduction 30 30  Hip internal rotation 45 45  Hip external rotation 45 45  Knee flexion 110/120 110/120  Knee extension 0 0  Ankle dorsiflexion      Ankle plantarflexion      Ankle inversion      Ankle eversion       (Blank rows = not tested)        LOWER EXTREMITY MMT:   MMT Right eval Left eval  Hip flexion 4 4  Hip extension 4- 4-  Hip abduction  4- 4-  Hip adduction 3+ 3+  Hip internal rotation      Hip external rotation      Knee flexion 4 4  Knee extension 4 4  Ankle dorsiflexion      Ankle plantarflexion      Ankle inversion      Ankle eversion       (Blank rows = not tested)   LOWER EXTREMITY SPECIAL TESTS:  Hip special tests: Marcello Moores test: negative and Ely's test: positive    FUNCTIONAL TESTS:  5 times sit to stand: 19 sec  30 seconds chair stand test: 9 reps  Dynamic Gait Index: NT    GAIT: Distance walked: 10 meters  Assistive device utilized: None Level of assistance: Complete Independence Comments: Decreased step length on RLE        TODAY'S TREATMENT:  12/20/22:   Nu-Step with seat and arms at 6 for 5 min. L2 resistance.  Side Lying Right Hip Abduction with #3 AW with emphasis on slow eccentric phase of exercise 2 x 10  Side Lying Right  Hip Abduction with red band 1 x 10  -Does not experience as much resistance on eccentric portion  Side Step Ups on 4 inch step on RLE with 1 UE support 1 x 5 -Pt unable to control concentric portion of exercise  without resting foot on ground  Hip Bumps against the Wall with RLE as stance leg  -mod VC to avoid lateral trunk lean    12/12/22:   There.ex:   Nu-Step with seat and arms at 6 for 5 min. L4 resistance.   Gait 100' with shoe wear then 100' in socks. Pt reporting difficulty ambulating in her slip on shoes compared to gait in socks at home. In slip on shoes decreased gait speed noted with consistent foot movement in shoe due to limited heel support leading to some mild circumduction at hip to clear her feet in swing phase. With socks pt improves gait speed and hip flexion/ankle dorsiflexion during swing phase compared to circumduction. Educated pt on benefits of lace up shoe wear with heel support to assist in gait.   4" step ups with RUE support leading with RLE for concentric and eccentric portions. 2x12   4" lateral steps with light BUE support for reduced falls risk. 1x12/direction   Seated hamstring stretch: 3x30 sec on RLE propped on 4" step.   Standing R gastroc stretch on wall: 2x30 sec   Gait 100' post stretching for pt response to improving knee flexibility. Mild improvement in gait reported from pt.   OMEGA Leg press BLE: 2x8, 45#  RLE only: 2x6, 20#. Initial minA for lift off of platform then able to complete.      PATIENT EDUCATION:  Education details: form and education for appropriate exercise and explanation of tests and measures  Person educated: Patient Education method: Explanation, Demonstration, Verbal cues, and Handouts Education comprehension: verbalized understanding, returned demonstration, and verbal cues required     HOME EXERCISE PROGRAM: Access Code: [email or web address] URL: [email or web address] Date: 12/20/2022 Prepared by: Bradly Chris  Exercises - Prone Quadriceps Stretch with Strap  - 1 x daily - 3 reps - 30-60 sec  hold - Side Lunge Adductor Stretch  - 1 x daily - 3 reps - 30 hold - Seated Table Hamstring Stretch  - 1 x daily - 3 reps  - 30-60 sec hold - Sit to Stand  - 3 x weekly - 3 sets - 10 reps - Step Up  -  3 x weekly - 3 sets - 10 reps - Sidelying Hip Adduction  - 3 x weekly - 3 sets - 10 reps - Standing Hip Hiking  - 3 x weekly - 3 sets - 10 reps - Sidelying Hip Abduction  - 3 x weekly - 3 sets - 10 reps     ASSESSMENT:   CLINICAL IMPRESSION:  Pt continues to exhibits a lack of concentric and eccentric control with hip abduction. Session focused on exercises to strengthen hip abduction to improve ability negotiate stairs. Able to perform regressed exercises with mod VC.  She  will continue to benefit from skilled PT services to address remaining strength deficits to improve stair navigation.  OBJECTIVE IMPAIRMENTS Abnormal gait, decreased balance, decreased ROM, and decreased strength.    ACTIVITY LIMITATIONS squatting, stairs, and locomotion level   PARTICIPATION LIMITATIONS: shopping, community activity, and yard work   [place] Age and 3+ comorbidities: chronic kidney disease, HTN, and h/o right knee patella fracture   are also affecting patient's functional outcome.    REHAB POTENTIAL: Good   CLINICAL DECISION MAKING: Stable/uncomplicated   EVALUATION COMPLEXITY: Low     GOALS: Goals reviewed with patient? No   SHORT TERM GOALS: Target date: 10/18/2022  Pt will be independent with HEP in order to improve strength and balance in order to decrease fall risk and improve function at home and work. Baseline: Performing independently  Goal status: Ongoing    2.  Pt will decrease 5TSTS by at least 3 seconds in order to demonstrate clinically significant improvement in LE strength.  Baseline: 9 sec  Goal status: Ongoing    3.  Pt will increase 10MWT by at least 0.13 m/s in order to demonstrate clinically significant improvement in community ambulation. Baseline: 0.50 m/sec  11/06/22: 0.65 m/sec  Goal status: Achieved        LONG TERM GOALS: Target date: 12/13/2022    Patient will have  improved function and activity level as evidenced by an increase in FOTO score by 10 points or more. Baseline: 43/100 with target of 52  Goal status: Ongoing    2.  Patient will improve hip and knee strength by 1/3 MMT (I.e 4- to 4) for improved hip stability to improve steadiness of gait to decrease risk of falling.  Baseline: Hip Adduction R/L 3+/3+, Hip Abduction R/L 4-/4-, Hip Ext R/L  4-/-, Knee Flex R/L 4/4, Knee Ext R/L4/4 11/06/22: Hip Adduction R/L 4-/4-, Hip Abduction R/L 4/4, Hip Ext R/L  4/4, Knee Flex R/L 5/5, Knee Ext R/L 5/5 Goal status: Achieved    3.  Patient will demonstrate reduced falls risk as evidenced by Dynamic Gait Index (DGI) >19/24. Baseline:15/24  11/06/22: 19/24 Goal status: Partially Met     4.  Patient will improve 5 x STS to 11 sec to demonstrate improved LE strength that places her at a decreased risk for falls. Baseline: 19 sec   11/06/22: 24 sec  Goal status: Ongoing    5.  Patient will demonstrate a gait speed > 1.0 m/sec to show that she capable of a gait speed that places her at a decreased risk for falls.  Baseline: 0.50 m/sec 11/06/22: 0.65 m/sec  Goal status: Partially met    6.  Patient will perform 8>= sit to stands within 30 sec to show LE endurance that places her at a decreased risk for falls.  Baseline: 9 reps   11/06/22: 6 reps  Goal status: Partially met  PLAN: PT FREQUENCY: 1-2x/week   PT DURATION: 10 weeks   PLANNED INTERVENTIONS: Therapeutic exercises, Neuromuscular re-education, Balance training, Gait training, Joint mobilization, Joint manipulation, Stair training, DME instructions, Aquatic Therapy, Dry Needling, Spinal manipulation, Spinal mobilization, Cryotherapy, Moist heat, Manual therapy, and Re-evaluation   PLAN FOR NEXT SESSION:  Assess R knee hamstring and gastroc length. Continue to progress hip strength. Steinauer PT, DPT  Physical Therapist- [facility]  12/20/2022,  11:04 AM

## 2022-12-25 ENCOUNTER — Encounter: Payer: Self-pay | Admitting: Physical Therapy

## 2022-12-25 ENCOUNTER — Ambulatory Visit: Payer: Medicare Other | Admitting: Physical Therapy

## 2022-12-25 DIAGNOSIS — M6281 Muscle weakness (generalized): Secondary | ICD-10-CM

## 2022-12-25 DIAGNOSIS — R262 Difficulty in walking, not elsewhere classified: Secondary | ICD-10-CM

## 2022-12-25 NOTE — Therapy (Signed)
OUTPATIENT PHYSICAL THERAPY TREATMENT NOTE   Patient Name: Lori [name] MRN: [id number] DOB:07-03-1934, 88 y.o., female Today's Date: 12/25/2022  PCP: Dr. Einar Pheasant  REFERRING PROVIDER: Dr. Einar Pheasant   END OF SESSION:   PT End of Session - 12/25/22 1333     Visit Number 17    Number of Visits 20    Date for PT Re-Evaluation 02/14/23    Authorization Type Medicare    Authorization Time Period 12/14/22-02/14/23    Authorization - Visit Number 32    Authorization - Number of Visits 20    Progress Note Due on Visit 20    PT Start Time 1330    PT Stop Time 6063    PT Time Calculation (min) 45 min    Equipment Utilized During Treatment Gait belt    Activity Tolerance Patient tolerated treatment well    Behavior During Therapy WFL for tasks assessed/performed               Past Medical History:  Diagnosis Date   Anemia    iron deficient   Arthritis    Autoimmune hepatitis ([place]) 05/10/2016   Hep C   B12 deficiency    Barrett esophagus 01/2016   Bradycardia    Chronic kidney disease    stage III   Diverticulosis    GERD (gastroesophageal reflux disease)    Hypertension    Hypothyroidism    Ulcer disease    Past Surgical History:  Procedure Laterality Date   CATARACT EXTRACTION W/PHACO Left 08/30/2021   Procedure: CATARACT EXTRACTION PHACO AND INTRAOCULAR LENS PLACEMENT ([place]) LEFT 8.21 01:00.3;  Surgeon: Leandrew Koyanagi, MD;  Location: [place];  Service: Ophthalmology;  Laterality: Left;   CATARACT EXTRACTION W/PHACO Right 09/13/2021   Procedure: CATARACT EXTRACTION PHACO AND INTRAOCULAR LENS PLACEMENT ([place]) RIGHT;  Surgeon: Leandrew Koyanagi, MD;  Location: [place];  Service: Ophthalmology;  Laterality: Right;  8.11 00:59.8   CHOLECYSTECTOMY N/A 06/25/2018   Procedure: LAPAROSCOPIC CHOLECYSTECTOMY WITH INTRAOPERATIVE CHOLANGIOGRAM;  Surgeon: Robert Bellow, MD;  Location: [facility];  Service: General;  Laterality:  N/A;   ESOPHAGOGASTRODUODENOSCOPY (EGD) WITH PROPOFOL N/A 01/30/2016   Procedure: ESOPHAGOGASTRODUODENOSCOPY (EGD) WITH PROPOFOL;  Surgeon: Lollie Sails, MD;  Location: [facility] ENDOSCOPY;  Service: Endoscopy;  Laterality: N/A;   ESOPHAGOGASTRODUODENOSCOPY (EGD) WITH PROPOFOL N/A 01/14/2018   Procedure: ESOPHAGOGASTRODUODENOSCOPY (EGD) WITH PROPOFOL;  Surgeon: Lollie Sails, MD;  Location: [facility] ENDOSCOPY;  Service: Endoscopy;  Laterality: N/A;   ESOPHAGOGASTRODUODENOSCOPY (EGD) WITH PROPOFOL N/A 05/20/2018   Procedure: ESOPHAGOGASTRODUODENOSCOPY (EGD) WITH PROPOFOL;  Surgeon: Lollie Sails, MD;  Location: [facility] ENDOSCOPY;  Service: Endoscopy;  Laterality: N/A;   ESOPHAGOGASTRODUODENOSCOPY (EGD) WITH PROPOFOL N/A 01/13/2019   Procedure: ESOPHAGOGASTRODUODENOSCOPY (EGD) WITH PROPOFOL;  Surgeon: Lollie Sails, MD;  Location: [facility] ENDOSCOPY;  Service: Endoscopy;  Laterality: N/A;   HERNIA REPAIR     lap hiatal hernia repair  07/08/08   LEFT OOPHORECTOMY  1993   benign ovarian cyst   TUBAL LIGATION  1981   Patient Active Problem List   Diagnosis Date Noted   Bladder prolapse, female, acquired 05/06/2022   Hematoma 10/22/2021   Right knee pain 10/13/2021   SVT (supraventricular tachycardia) 10/07/2021   PSVT (paroxysmal supraventricular tachycardia) 10/06/2021   Numbness in feet 06/18/2021   CKD (chronic kidney disease) 06/12/2021   Dizziness 04/10/2021   Chest tightness 02/07/2021   Diarrhea 10/09/2020   Abdominal discomfort 09/27/2019   Gall bladder polyp 05/07/2018   Breast tenderness 05/07/2018  Hyperglycemia 09/10/2016   Fullness of breast 09/10/2016   Autoimmune hepatitis (HCC) 05/10/2016   MGUS (monoclonal gammopathy of unknown significance) 06/06/2015   LUQ fullness 06/06/2015   Health care maintenance 06/06/2015   Fatty tumor 03/07/2015   BMI 32.0-32.9,adult 01/02/2015   Abnormal liver function tests 01/02/2015   Hand cramps 11/15/2014   Leg cramps 06/13/2014    Osteopenia 03/02/2013   Hyperbilirubinemia 03/02/2013   Hypertension 12/22/2012   Gastroparesis 12/22/2012   Barrett's esophagus with esophagitis 12/22/2012   Hypothyroidism 12/22/2012   Anemia 12/22/2012   B12 deficiency 12/22/2012   CKD (chronic kidney disease) stage 3, GFR 30-59 ml/min (HCC) 12/22/2012    REFERRING DIAG: M25.561 (ICD-10-CM) - Right knee pain, unspecified chronicity  THERAPY DIAG:  Muscle weakness (generalized)  Difficulty in walking, not elsewhere classified  Rationale for Evaluation and Treatment Rehabilitation  PERTINENT HISTORY: Pt was initially sent her by PCP for right knee pain from a patella fracture that she sustained back in February.  She occasionally feels pain in her left knee, but she does not identify this as a problem. Pt describes feeling like she is walking stiff where she is having to lift the right leg upstairs.  She feels afraid of falling and she walks slower as a result. She does not normally use a SPC with the exception of shopping in a grocery store.   PRECAUTIONS: Falls   SUBJECTIVE: Pt reports that she has not felt much knee pain today and only felt a little over weekend in the middle of the knee. She reports that her legs feel tired when walking longer distances especially when she is not using an AD.    PAIN:  Are you having pain? No   OBJECTIVE:                 BP 180/54 HR 58 SpO2 100      DIAGNOSTIC FINDINGS: CLINICAL DATA:  Right knee pain, fall   EXAM: RIGHT KNEE - 1-2 VIEW   COMPARISON:  None.   FINDINGS: Advanced tricompartment degenerative changes with joint space narrowing and spurring. Moderate joint effusion. No acute bony abnormality. Specifically, no fracture, subluxation, or dislocation.   IMPRESSION: No acute bony abnormality.  Degenerative changes and joint effusion.     Electronically Signed   By: Kevin  Dover M.D.   On: 10/16/2021 20:51   PATIENT SURVEYS:  FOTO 45/100 with target of 52     COGNITION:           Overall cognitive status: Within functional limits for tasks assessed                          SENSATION: WFL       MUSCLE LENGTH: Hamstrings: Right 60 deg; Left 60 deg     POSTURE: rounded shoulders and forward head   PALPATION: TTP of anterior surface of bilateral knees    LOWER EXTREMITY ROM:     Active /PROM Right 10/04/2022 Left 10/04/2022  Hip flexion 120 120  Hip extension 30 30  Hip abduction 45 45  Hip adduction 30 30  Hip internal rotation 45 45  Hip external rotation 45 45  Knee flexion 110/120 110/120  Knee extension 0 0  Ankle dorsiflexion      Ankle plantarflexion      Ankle inversion      Ankle eversion       (Blank rows = not tested)          LOWER EXTREMITY MMT:   MMT Right eval Left eval  Hip flexion 4 4  Hip extension 4- 4-  Hip abduction 4- 4-  Hip adduction 3+ 3+  Hip internal rotation      Hip external rotation      Knee flexion 4 4  Knee extension 4 4  Ankle dorsiflexion      Ankle plantarflexion      Ankle inversion      Ankle eversion       (Blank rows = not tested)   LOWER EXTREMITY SPECIAL TESTS:  Hip special tests: Marcello Moores test: negative and Ely's test: positive    FUNCTIONAL TESTS:  5 times sit to stand: 19 sec  30 seconds chair stand test: 9 reps  Dynamic Gait Index: NT    GAIT: Distance walked: 10 meters  Assistive device utilized: None Level of assistance: Complete Independence Comments: Decreased step length on RLE        TODAY'S TREATMENT:  12/25/22:   Nu-Step with seat and arms at 6 for 5 min. L2 resistance.  10 mWT: 15.23 sec and 14.51 sec= 14.87 sec   10/14.87= 0.67 m/sec     LE endurance and strengthening circuits:  500 meter walk   -Pt reports increased tightness in quads  Ely's Test: + Bilateral                        Prone Quad Stretch with PT providing overpressure 4 x 30 sec                        Prone Quad Stretch with strap 4 x 30 sec                        500 meter  walk                        -Pt continues to feel tightness in her right quad                        Straight Leg Raise                        -Tension experienced at 70 deg hip flexion bilateral                        Modified Thomas Stretch on right hip 3 x 30 sec                        Seated Hamstring stretch 3 x 30 sec     12/20/22:   Nu-Step with seat and arms at 6 for 5 min. L2 resistance.  Side Lying Right Hip Abduction with #3 AW with emphasis on slow eccentric phase of exercise 2 x 10  Side Lying Right  Hip Abduction with red band 1 x 10  -Does not experience as much resistance on eccentric portion  Side Step Ups on 4 inch step on RLE with 1 UE support 1 x 5 -Pt unable to control concentric portion of exercise without resting foot on ground  Hip Bumps against the Wall with RLE as stance leg  -mod VC to avoid lateral trunk lean    12/12/22:   There.ex:   Nu-Step with seat and arms at 6 for 5 min. L4  resistance.   Gait 100' with shoe wear then 100' in socks. Pt reporting difficulty ambulating in her slip on shoes compared to gait in socks at home. In slip on shoes decreased gait speed noted with consistent foot movement in shoe due to limited heel support leading to some mild circumduction at hip to clear her feet in swing phase. With socks pt improves gait speed and hip flexion/ankle dorsiflexion during swing phase compared to circumduction. Educated pt on benefits of lace up shoe wear with heel support to assist in gait.   4" step ups with RUE support leading with RLE for concentric and eccentric portions. 2x12   4" lateral steps with light BUE support for reduced falls risk. 1x12/direction   Seated hamstring stretch: 3x30 sec on RLE propped on 4" step.   Standing R gastroc stretch on wall: 2x30 sec   Gait 100' post stretching for pt response to improving knee flexibility. Mild improvement in gait reported from pt.   OMEGA Leg press BLE: 2x8, 45#  RLE only: 2x6, 20#.  Initial minA for lift off of platform then able to complete.      PATIENT EDUCATION:  Education details: form and education for appropriate exercise and explanation of tests and measures  Person educated: Patient Education method: Explanation, Demonstration, Verbal cues, and Handouts Education comprehension: verbalized understanding, returned demonstration, and verbal cues required   HOME EXERCISES:    Access Code: [email or web address] URL: [email or web address] Date: 12/25/2022 Prepared by:    Exercises - Prone Quadriceps Stretch with Strap  - 1 x daily - 3 reps - 30-60 sec  hold - Side Lunge Adductor Stretch  - 1 x daily - 3 reps - 30 hold - Seated Table Hamstring Stretch  - 1 x daily - 3 reps - 30-60 sec hold - Sit to Stand  - 3 x weekly - 3 sets - 10 reps - Step Up  - 3 x weekly - 3 sets - 10 reps - Sidelying Hip Adduction  - 3 x weekly - 3 sets - 10 reps - Standing Hip Hiking  - 3 x weekly - 3 sets - 10 reps - Sidelying Hip Abduction  - 3 x weekly - 3 sets - 10 reps - Modified Thomas Stretch  - 1 x daily - 3 reps - 30-60 sec  hold    ASSESSMENT:   CLINICAL IMPRESSION:  Pt shows little improvement with LE strength and endurance compared to baseline and it is unclear why she is having limited progress. Focused session on progressing walking endurance and determining what muscles are becoming fatigued when walking for long distances. Pt's quads are fatiguing the quickest with most tension experienced in right leg. Pt also shows decreased hip flexor and quad length with positive Ely's test along with decreased HS length. However, stretching quads did not appear to have that much of an effect on soreness sensation experienced after walking for a prolonged distance. She  will continue to benefit from skilled PT services to address remaining strength deficits to improve stair navigation.  OBJECTIVE IMPAIRMENTS Abnormal gait, decreased balance, decreased ROM, and  decreased strength.    ACTIVITY LIMITATIONS squatting, stairs, and locomotion level   PARTICIPATION LIMITATIONS: shopping, community activity, and yard work   PERSONAL FACTORS Age and 3+ comorbidities: chronic kidney disease, HTN, and h/o right knee patella fracture   are also affecting patient's functional outcome.    REHAB POTENTIAL: Good   CLINICAL DECISION MAKING: Stable/uncomplicated   EVALUATION COMPLEXITY: Low       GOALS: Goals reviewed with patient? No   SHORT TERM GOALS: Target date: 10/18/2022  Pt will be independent with HEP in order to improve strength and balance in order to decrease fall risk and improve function at home and work. Baseline: Performing independently  Goal status: Ongoing    2.  Pt will decrease 5TSTS by at least 3 seconds in order to demonstrate clinically significant improvement in LE strength.  Baseline: 9 sec  Goal status: Ongoing    3.  Pt will increase 10MWT by at least 0.13 m/s in order to demonstrate clinically significant improvement in community ambulation. Baseline: 0.50 m/sec  11/06/22: 0.65 m/sec  Goal status: Achieved        LONG TERM GOALS: Target date: 12/13/2022    Patient will have improved function and activity level as evidenced by an increase in FOTO score by 10 points or more. Baseline: 43/100 with target of 52  Goal status: Ongoing    2.  Patient will improve hip and knee strength by 1/3 MMT (I.e 4- to 4) for improved hip stability to improve steadiness of gait to decrease risk of falling.  Baseline: Hip Adduction R/L 3+/3+, Hip Abduction R/L 4-/4-, Hip Ext R/L  4-/-, Knee Flex R/L 4/4, Knee Ext R/L4/4 11/06/22: Hip Adduction R/L 4-/4-, Hip Abduction R/L 4/4, Hip Ext R/L  4/4, Knee Flex R/L 5/5, Knee Ext R/L 5/5 Goal status: Achieved    3.  Patient will demonstrate reduced falls risk as evidenced by Dynamic Gait Index (DGI) >19/24. Baseline:15/24  11/06/22: 19/24 Goal status: Partially Met     4.  Patient will improve 5 x  STS to 11 sec to demonstrate improved LE strength that places her at a decreased risk for falls. Baseline: 19 sec   11/06/22: 24 sec   12/25/22: 16 sec  Goal status: Ongoing    5.  Patient will demonstrate a gait speed > 1.0 m/sec to show that she capable of a gait speed that places her at a decreased risk for falls.  Baseline: 0.50 m/sec 11/06/22: 0.65 m/sec 12/25/22: 0.67 m/sec  Goal status: Partially met    6.  Patient will perform 8>= sit to stands within 30 sec to show LE endurance that places her at a decreased risk for falls.  Baseline: 9 reps   11/06/22: 6 reps  12/25/22: 11 reps  Goal status: Achieved      PLAN: PT FREQUENCY: 1-2x/week   PT DURATION: 10 weeks   PLANNED INTERVENTIONS: Therapeutic exercises, Neuromuscular re-education, Balance training, Gait training, Joint mobilization, Joint manipulation, Stair training, DME instructions, Aquatic Therapy, Dry Needling, Spinal manipulation, Spinal mobilization, Cryotherapy, Moist heat, Manual therapy, and Re-evaluation   PLAN FOR NEXT SESSION:  FOTO. Continue to progress LE strength and endurance with focus on strengthening and stretching quads. Put exercises in circuit with strengthening followed by overground walking    PT, DPT  Physical Therapist- [place]  [facility]  12/25/2022, 1:37 PM 

## 2022-12-25 NOTE — Addendum Note (Signed)
Addended by: Daneil Dan on: 12/25/2022 03:19 PM   Modules accepted: Orders

## 2022-12-27 ENCOUNTER — Encounter: Payer: Self-pay | Admitting: Physical Therapy

## 2022-12-27 ENCOUNTER — Ambulatory Visit: Payer: Medicare Other | Admitting: Physical Therapy

## 2022-12-27 DIAGNOSIS — M6281 Muscle weakness (generalized): Secondary | ICD-10-CM

## 2022-12-27 DIAGNOSIS — R262 Difficulty in walking, not elsewhere classified: Secondary | ICD-10-CM

## 2022-12-27 NOTE — Therapy (Signed)
OUTPATIENT PHYSICAL THERAPY TREATMENT NOTE   Patient Name: Lori [name] MRN: [id number] DOB:1934/08/06, 88 y.o., female Today's Date: 12/27/2022  PCP: Dr. Einar Pheasant  REFERRING PROVIDER: Dr. Einar Pheasant   END OF SESSION:   PT End of Session - 12/27/22 1336     Visit Number 18    Number of Visits 20    Date for PT Re-Evaluation 02/14/23    Authorization Type Medicare    Authorization Time Period 12/14/22-02/14/23    Authorization - Visit Number 18    Authorization - Number of Visits 20    Progress Note Due on Visit 20    PT Start Time 1330    PT Stop Time 3335    PT Time Calculation (min) 45 min    Equipment Utilized During Treatment Gait belt    Activity Tolerance Patient tolerated treatment well    Behavior During Therapy WFL for tasks assessed/performed               Past Medical History:  Diagnosis Date   Anemia    iron deficient   Arthritis    Autoimmune hepatitis ([place]) 05/10/2016   Hep C   B12 deficiency    Barrett esophagus 01/2016   Bradycardia    Chronic kidney disease    stage III   Diverticulosis    GERD (gastroesophageal reflux disease)    Hypertension    Hypothyroidism    Ulcer disease    Past Surgical History:  Procedure Laterality Date   CATARACT EXTRACTION W/PHACO Left 08/30/2021   Procedure: CATARACT EXTRACTION PHACO AND INTRAOCULAR LENS PLACEMENT ([place]) LEFT 8.21 01:00.3;  Surgeon: Leandrew Koyanagi, MD;  Location: [place];  Service: Ophthalmology;  Laterality: Left;   CATARACT EXTRACTION W/PHACO Right 09/13/2021   Procedure: CATARACT EXTRACTION PHACO AND INTRAOCULAR LENS PLACEMENT ([place]) RIGHT;  Surgeon: Leandrew Koyanagi, MD;  Location: [place];  Service: Ophthalmology;  Laterality: Right;  8.11 00:59.8   CHOLECYSTECTOMY N/A 06/25/2018   Procedure: LAPAROSCOPIC CHOLECYSTECTOMY WITH INTRAOPERATIVE CHOLANGIOGRAM;  Surgeon: Robert Bellow, MD;  Location: [facility];  Service: General;  Laterality:  N/A;   ESOPHAGOGASTRODUODENOSCOPY (EGD) WITH PROPOFOL N/A 01/30/2016   Procedure: ESOPHAGOGASTRODUODENOSCOPY (EGD) WITH PROPOFOL;  Surgeon: Lollie Sails, MD;  Location[facility] END[facility]COPY;  Service: Endoscopy;  Laterality: N/A;   ESOPHAGOGASTRODUODENOSCOPY (EGD) WITH PROPOFOL N/A 01/14/2018   Procedure: ESOPHAGOGASTRODUODENOSCOPY (EGD) WITH PROPOFOL;  Surgeon: Lollie Sails, MD;  Location: [facility] ENDOSCOPY;  Service: Endoscopy;  Laterality: N/A;   ESOPHAGOGASTRODUODENOSCOPY (EGD) WITH PROPOFOL N/A 05/20/2018   Procedure: ESOPHAGOGASTRODUODENOSCOPY (EGD) WITH PROPOFOL;  Surgeon: Lollie Sails, MD;  Location: [facility] ENDOSCOPY;  Service: Endoscopy;  Laterality: N/A;   ESOPHAGOGASTRODUODENOSCOPY (EGD) WITH PROPOFOL N/A 01/13/2019   Procedure: ESOPHAGOGASTRODUODENOSCOPY (EGD) WITH PROPOFOL;  Surgeon: Lollie Sails, MD;  Location: [facility] ENDOSCOPY;  Service: Endoscopy;  Laterality: N/A;   HERNIA REPAIR     lap hiatal hernia repair  07/08/08   LEFT OOPHORECTOMY  1993   benign ovarian cyst   TUBAL LIGATION  1981   Patient Active Problem List   Diagnosis Date Noted   Bladder prolapse, female, acquired 05/06/2022   Hematoma 10/22/2021   Right knee pain 10/13/2021   SVT (supraventricular tachycardia) 10/07/2021   PSVT (paroxysmal supraventricular tachycardia) 10/06/2021   Numbness in feet 06/18/2021   CKD (chronic kidney disease) 06/12/2021   Dizziness 04/10/2021   Chest tightness 02/07/2021   Diarrhea 10/09/2020   Abdominal discomfort 09/27/2019   Gall bladder polyp 05/07/2018   Breast tenderness 05/07/2018  Hyperglycemia 09/10/2016   Fullness of breast 09/10/2016   Autoimmune hepatitis ([place]) 05/10/2016   MGUS (monoclonal gammopathy of unknown significance) 06/06/2015   LUQ fullness 06/06/2015   Health care maintenance 06/06/2015   Fatty tumor 03/07/2015   BMI 32.0-32.9,adult 01/02/2015   Abnormal liver function tests 01/02/2015   Hand cramps 11/15/2014   Leg cramps 06/13/2014    Osteopenia 03/02/2013   Hyperbilirubinemia 03/02/2013   Hypertension 12/22/2012   Gastroparesis 12/22/2012   Barrett's esophagus with esophagitis 12/22/2012   Hypothyroidism 12/22/2012   Anemia 12/22/2012   B12 deficiency 12/22/2012   CKD (chronic kidney disease) stage 3, GFR 30-59 ml/min ([facility]) 12/22/2012    REFERRING DIAG: M25.561 (ICD-10-CM) - Right knee pain, unspecified chronicity  THERAPY DIAG:  Muscle weakness (generalized)  Difficulty in walking, not elsewhere classified  Rationale for Evaluation and Treatment Rehabilitation  PERTINENT HISTORY: Pt was initially sent her by PCP for right knee pain from a patella fracture that she sustained back in February.  She occasionally feels pain in her left knee, but she does not identify this as a problem. Pt describes feeling like she is walking stiff where she is having to lift the right leg upstairs.  She feels afraid of falling and she walks slower as a result. She does not normally use a SPC with the exception of shopping in a grocery store.   PRECAUTIONS: Falls   SUBJECTIVE: Pt states that her legs feel tired because she walked around mall a lot yesterday to get exercise.   PAIN:  Are you having pain? No   OBJECTIVE:                 BP 180/54 HR 58 SpO2 100      DIAGNOSTIC FINDINGS: CLINICAL DATA:  Right knee pain, fall   EXAM: RIGHT KNEE - 1-2 VIEW   COMPARISON:  None.   FINDINGS: Advanced tricompartment degenerative changes with joint space narrowing and spurring. Moderate joint effusion. No acute bony abnormality. Specifically, no fracture, subluxation, or dislocation.   IMPRESSION: No acute bony abnormality.  Degenerative changes and joint effusion.     Electronically Signed   By: Rolm Baptise M.D.   On: 10/16/2021 20:51   PATIENT SURVEYS:  FOTO 45/100 with target of 52    COGNITION:           Overall cognitive status: Within functional limits for tasks assessed                           SENSATION: WFL       MUSCLE LENGTH: Hamstrings: Right 60 deg; Left 60 deg     POSTURE: rounded shoulders and forward head   PALPATION: TTP of anterior surface of bilateral knees    LOWER EXTREMITY ROM:     Active /PROM Right 10/04/2022 Left 10/04/2022  Hip flexion 120 120  Hip extension 30 30  Hip abduction 45 45  Hip adduction 30 30  Hip internal rotation 45 45  Hip external rotation 45 45  Knee flexion 110/120 110/120  Knee extension 0 0  Ankle dorsiflexion      Ankle plantarflexion      Ankle inversion      Ankle eversion       (Blank rows = not tested)        LOWER EXTREMITY MMT:   MMT Right eval Left eval  Hip flexion 4 4  Hip extension 4- 4-  Hip abduction 4- 4-  Hip adduction 3+ 3+  Hip internal rotation      Hip external rotation      Knee flexion 4 4  Knee extension 4 4  Ankle dorsiflexion      Ankle plantarflexion      Ankle inversion      Ankle eversion       (Blank rows = not tested)   LOWER EXTREMITY SPECIAL TESTS:  Hip special tests: Marcello Moores test: negative and Ely's test: positive    FUNCTIONAL TESTS:  5 times sit to stand: 19 sec  30 seconds chair stand test: 9 reps  Dynamic Gait Index: NT    GAIT: Distance walked: 10 meters  Assistive device utilized: None Level of assistance: Complete Independence Comments: Decreased step length on RLE        TODAY'S TREATMENT:            12/27/22:                      TM at 1 mph for 5 min           Standing Heel Raises with 1 finger support  2 x 15            Standing Heel Raises with no UE support 1 x 15           Standing HS Curls with #3 AW and 1 finger support  2 x 15          Standing HS Curls with #3 AW   1 x 15          Standing Marches without UE support 1 x 10           Standing Marches with 1 finger  2 x 10           Standing Hip Abduction with BUE support 1 x 10           Standing Hip Abduction with 1 UE support 1 x 10           Standing Hip Abduction with BUE support  with #3 AW 1 x 10   12/25/22:   Nu-Step with seat and arms at 6 for 5 min. L2 resistance.  10 mWT: 15.23 sec and 14.51 sec= 14.87 sec   10/14.87= 0.67 m/sec     LE endurance and strengthening circuits:  500 meter walk   -Pt reports increased tightness in quads  Ely's Test: + Bilateral                        Prone Quad Stretch with PT providing overpressure 4 x 30 sec                        Prone Quad Stretch with strap 4 x 30 sec                        500 meter walk                        -Pt continues to feel tightness in her right quad                        Straight Leg Raise                        -Tension experienced at 70  deg hip flexion bilateral                        Modified Thomas Stretch on right hip 3 x 30 sec                        Seated Hamstring stretch 3 x 30 sec     12/20/22:   Nu-Step with seat and arms at 6 for 5 min. L2 resistance.  Side Lying Right Hip Abduction with #3 AW with emphasis on slow eccentric phase of exercise 2 x 10  Side Lying Right  Hip Abduction with red band 1 x 10  -Does not experience as much resistance on eccentric portion  Side Step Ups on 4 inch step on RLE with 1 UE support 1 x 5 -Pt unable to control concentric portion of exercise without resting foot on ground  Hip Bumps against the Wall with RLE as stance leg  -mod VC to avoid lateral trunk lean    12/12/22:   There.ex:   Nu-Step with seat and arms at 6 for 5 min. L4 resistance.   Gait 100' with shoe wear then 100' in socks. Pt reporting difficulty ambulating in her slip on shoes compared to gait in socks at home. In slip on shoes decreased gait speed noted with consistent foot movement in shoe due to limited heel support leading to some mild circumduction at hip to clear her feet in swing phase. With socks pt improves gait speed and hip flexion/ankle dorsiflexion during swing phase compared to circumduction. Educated pt on benefits of lace up shoe wear with heel support to  assist in gait.   4" step ups with RUE support leading with RLE for concentric and eccentric portions. 2x12   4" lateral steps with light BUE support for reduced falls risk. 1x12/direction   Seated hamstring stretch: 3x30 sec on RLE propped on 4" step.   Standing R gastroc stretch on wall: 2x30 sec   Gait 100' post stretching for pt response to improving knee flexibility. Mild improvement in gait reported from pt.   OMEGA Leg press BLE: 2x8, 45#  RLE only: 2x6, 20#. Initial minA for lift off of platform then able to complete.      PATIENT EDUCATION:  Education details: form and education for appropriate exercise and explanation of tests and measures  Person educated: Patient Education method: Explanation, Demonstration, Verbal cues, and Handouts Education comprehension: verbalized understanding, returned demonstration, and verbal cues required   HOME EXERCISES:    Access Code: [email or web address] URL: [email or web address] Date: 12/27/2022 Prepared by: Bradly Chris  Exercises - Prone Quadriceps Stretch with Strap  - 1 x daily - 3 reps - 30-60 sec  hold - Side Lunge Adductor Stretch  - 1 x daily - 3 reps - 30 hold - Seated Table Hamstring Stretch  - 1 x daily - 3 reps - 30-60 sec hold - Sit to Stand  - 3 x weekly - 3 sets - 10 reps - Sidelying Hip Adduction  - 3 x weekly - 3 sets - 10 reps - Modified Thomas Stretch  - 1 x daily - 3 reps - 30-60 sec  hold - Standing Heel Raises  - 3 x weekly - 3 sets - 15 reps - Standing Knee Flexion Strengthening at Chair  - 3 x weekly - 3 sets - 10 reps - Standing Hip Abduction with Counter Support  - 3 x weekly - 3  sets - 10 reps - Standing Hip Flexion  - 3 x weekly - 3 sets - 10 reps   ASSESSMENT:   CLINICAL IMPRESSION: Pt shows improvement with hip strength with ability to perform [place] exercises with increased resistance and decreased compensation. She did not experience an increase in LE fatigue during session. Continue to  progress [place] exercises and firm up HEP for next session for pt discharge. She will continue to benefit from skilled PT services to address remaining strength deficits to improve stair navigation.   OBJECTIVE IMPAIRMENTS Abnormal gait, decreased balance, decreased ROM, and decreased strength.    ACTIVITY LIMITATIONS squatting, stairs, and locomotion level   PARTICIPATION LIMITATIONS: shopping, community activity, and yard work   [place] Age and 3+ comorbidities: chronic kidney disease, HTN, and h/o right knee patella fracture   are also affecting patient's functional outcome.    REHAB POTENTIAL: Good   CLINICAL DECISION MAKING: Stable/uncomplicated   EVALUATION COMPLEXITY: Low     GOALS: Goals reviewed with patient? No   SHORT TERM GOALS: Target date: 10/18/2022  Pt will be independent with HEP in order to improve strength and balance in order to decrease fall risk and improve function at home and work. Baseline: Performing independently  Goal status: Ongoing    2.  Pt will decrease 5TSTS by at least 3 seconds in order to demonstrate clinically significant improvement in LE strength.  Baseline: 19 sec   12/27/22: 16 sec  Goal status: Achieved    3.  Pt will increase 10MWT by at least 0.13 m/s in order to demonstrate clinically significant improvement in community ambulation. Baseline: 0.50 m/sec  11/06/22: 0.65 m/sec  Goal status: Achieved        LONG TERM GOALS: Target date: 12/13/2022    Patient will have improved function and activity level as evidenced by an increase in FOTO score by 10 points or more. Baseline: 43/100 with target of 52  Goal status: Ongoing    2.  Patient will improve hip and knee strength by 1/3 MMT (I.e 4- to 4) for improved hip stability to improve steadiness of gait to decrease risk of falling.  Baseline: Hip Adduction R/L 3+/3+, Hip Abduction R/L 4-/4-, Hip Ext R/L  4-/-, Knee Flex R/L 4/4, Knee Ext R/L4/4 11/06/22: Hip Adduction R/L  4-/4-, Hip Abduction R/L 4/4, Hip Ext R/L  4/4, Knee Flex R/L 5/5, Knee Ext R/L 5/5 Goal status: Achieved    3.  Patient will demonstrate reduced falls risk as evidenced by Dynamic Gait Index (DGI) >19/24. Baseline:15/24  11/06/22: 19/24 Goal status: Partially Met     4.  Patient will improve 5 x STS to 11 sec to demonstrate improved LE strength that places her at a decreased risk for falls. Baseline: 19 sec   11/06/22: 24 sec   12/25/22: 16 sec  Goal status: Ongoing    5.  Patient will demonstrate a gait speed > 1.0 m/sec to show that she capable of a gait speed that places her at a decreased risk for falls.  Baseline: 0.50 m/sec 11/06/22: 0.65 m/sec 12/25/22: 0.67 m/sec  Goal status: Partially met    6.  Patient will perform 8>= sit to stands within 30 sec to show LE endurance that places her at a decreased risk for falls.  Baseline: 9 reps   11/06/22: 6 reps  12/25/22: 11 reps  Goal status: Achieved      PLAN: PT FREQUENCY: 1-2x/week   PT DURATION: 10 weeks   PLANNED  INTERVENTIONS: Therapeutic exercises, Neuromuscular re-education, Balance training, Gait training, Joint mobilization, Joint manipulation, Stair training, DME instructions, Aquatic Therapy, Dry Needling, Spinal manipulation, Spinal mobilization, Cryotherapy, Moist heat, Manual therapy, and Re-evaluation   PLAN FOR NEXT SESSION:  FOTO. Progress [place] exercises. Reassess hip strength. Put exercises in circuit with strengthening followed by overground walking  Bradly Chris PT, DPT  Physical Therapist- [facility]  12/27/2022, 2:26 PM

## 2022-12-28 ENCOUNTER — Ambulatory Visit: Payer: Medicare Other | Admitting: Internal Medicine

## 2022-12-30 ENCOUNTER — Encounter: Payer: Self-pay | Admitting: Internal Medicine

## 2022-12-30 NOTE — Assessment & Plan Note (Signed)
Upper symptoms controlled.  Continues protonix.  EGD 12/2018.  Recommended f/u EGD in 2 years.  Saw GI.  Last seen 02/2022 - recommended hold f/u EGD.

## 2022-12-30 NOTE — Assessment & Plan Note (Signed)
On benazepril '20mg'$  bid.  On sotalol '40mg'$  q day.   Also cardiology started her on hctz. Blood pressure doing well.  Continue current medications.  Follow metabolic panel.

## 2022-12-30 NOTE — Assessment & Plan Note (Signed)
Follow cbc.  

## 2022-12-30 NOTE — Assessment & Plan Note (Signed)
On thyroid replacement.  Follow tsh.  

## 2022-12-30 NOTE — Assessment & Plan Note (Signed)
S/p cholecystectomy

## 2022-12-30 NOTE — Assessment & Plan Note (Signed)
Followed by GI.  Stable.  Follow liver function tests.

## 2022-12-30 NOTE — Assessment & Plan Note (Signed)
Continue sotalol.  No increased heart rate or palpitations reported currently.

## 2022-12-30 NOTE — Assessment & Plan Note (Signed)
Followed by GI.  Follow liver function tests.

## 2022-12-30 NOTE — Assessment & Plan Note (Signed)
Continues on sotalol.  Stable.

## 2022-12-30 NOTE — Assessment & Plan Note (Signed)
Avoid antiinflammatories.  Stay hydrated.  Follow metabolic panel.  Check metabolic panel.

## 2022-12-30 NOTE — Assessment & Plan Note (Signed)
Has seen ortho.  Seeing PT.  Follow.

## 2022-12-30 NOTE — Assessment & Plan Note (Signed)
Low carb diet and exercise.  Follow met b and a1c.  

## 2023-01-01 ENCOUNTER — Ambulatory Visit: Payer: Medicare Other | Attending: Internal Medicine | Admitting: Physical Therapy

## 2023-01-01 DIAGNOSIS — R262 Difficulty in walking, not elsewhere classified: Secondary | ICD-10-CM | POA: Insufficient documentation

## 2023-01-01 DIAGNOSIS — M6281 Muscle weakness (generalized): Secondary | ICD-10-CM | POA: Insufficient documentation

## 2023-01-01 NOTE — Therapy (Addendum)
OUTPATIENT PHYSICAL THERAPY TREATMENT NOTE   Patient Name: Lori [name] MRN: [id number] DOB:August 21, 1934, 89 y.o., female Today's Date: 01/01/2023  PCP: Dr. Einar Pheasant  REFERRING PROVIDER: Dr. Einar Pheasant   END OF SESSION:   PT End of Session - 01/01/23 1423     Visit Number 19    Number of Visits 20    Date for PT Re-Evaluation 02/14/23    Authorization Type Medicare    Authorization Time Period 12/14/22-02/14/23    Authorization - Visit Number 25    Authorization - Number of Visits 20    Progress Note Due on Visit 19    PT Start Time 1638    PT Stop Time 1500    PT Time Calculation (min) 40 min    Equipment Utilized During Treatment Gait belt    Activity Tolerance Patient tolerated treatment well    Behavior During Therapy WFL for tasks assessed/performed                Past Medical History:  Diagnosis Date   Anemia    iron deficient   Arthritis    Autoimmune hepatitis ([place]) 05/10/2016   Hep C   B12 deficiency    Barrett esophagus 01/2016   Bradycardia    Chronic kidney disease    stage III   Diverticulosis    GERD (gastroesophageal reflux disease)    Hypertension    Hypothyroidism    Ulcer disease    Past Surgical History:  Procedure Laterality Date   CATARACT EXTRACTION W/PHACO Left 08/30/2021   Procedure: CATARACT EXTRACTION PHACO AND INTRAOCULAR LENS PLACEMENT ([place]) LEFT 8.21 01:00.3;  Surgeon: Leandrew Koyanagi, MD;  Location: [facility];  Service: Ophthalmology;  Laterality: Left;   CATARACT EXTRACTION W/PHACO Right 09/13/2021   Procedure: CATARACT EXTRACTION PHACO AND INTRAOCULAR LENS PLACEMENT ([place]) RIGHT;  Surgeon: Leandrew Koyanagi, MD;  Location: [facility];  Service: Ophthalmology;  Laterality: Right;  8.11 00:59.8   CHOLECYSTECTOMY N/A 06/25/2018   Procedure: LAPAROSCOPIC CHOLECYSTECTOMY WITH INTRAOPERATIVE CHOLANGIOGRAM;  Surgeon: Robert Bellow, MD;  Location: [facility];  Service: General;  Laterality:  N/A;   ESOPHAGOGASTRODUODENOSCOPY (EGD) WITH PROPOFOL N/A 01/30/2016   Procedure: ESOPHAGOGASTRODUODENOSCOPY (EGD) WITH PROPOFOL;  Surgeon: Lollie Sails, MD;  Location: [facility] END[facility]COPY;  Service: Endoscopy;  Laterality: N/A;   ESOPHAGOGASTRODUODENOSCOPY (EGD) WITH PROPOFOL N/A 01/14/2018   Procedure: ESOPHAGOGASTRODUODENOSCOPY (EGD) WITH PROPOFOL;  Surgeon: Lollie Sails, MD;  Location: [facility];  Service: Endoscopy;  Laterality: N/A;   ESOPHAGOGASTRODUODENOSCOPY (EGD) WITH PROPOFOL N/A 05/20/2018   Procedure: ESOPHAGOGASTRODUODENOSCOPY (EGD) WITH PROPOFOL;  Surgeon: Lollie Sails, MD;  Location: [facility] ENDOSCOPY;  Service: Endoscopy;  Laterality: N/A;   ESOPHAGOGASTRODUODENOSCOPY (EGD) WITH PROPOFOL N/A 01/13/2019   Procedure: ESOPHAGOGASTRODUODENOSCOPY (EGD) WITH PROPOFOL;  Surgeon: Lollie Sails, MD;  Location: [facility] ENDOSCOPY;  Service: Endoscopy;  Laterality: N/A;   HERNIA REPAIR     lap hiatal hernia repair  07/08/08   LEFT OOPHORECTOMY  1993   benign ovarian cyst   TUBAL LIGATION  1981   Patient Active Problem List   Diagnosis Date Noted   Bladder prolapse, female, acquired 05/06/2022   Hematoma 10/22/2021   Right knee pain 10/13/2021   SVT (supraventricular tachycardia) 10/07/2021   PSVT (paroxysmal supraventricular tachycardia) 10/06/2021   Numbness in feet 06/18/2021   CKD (chronic kidney disease) 06/12/2021   Dizziness 04/10/2021   Chest tightness 02/07/2021   Diarrhea 10/09/2020   Abdominal discomfort 09/27/2019   Gall bladder polyp 05/07/2018   Breast tenderness  05/07/2018   Hyperglycemia 09/10/2016   Fullness of breast 09/10/2016   Autoimmune hepatitis ([place]) 05/10/2016   MGUS (monoclonal gammopathy of unknown significance) 06/06/2015   LUQ fullness 06/06/2015   Health care maintenance 06/06/2015   Fatty tumor 03/07/2015   BMI 32.0-32.9,adult 01/02/2015   Abnormal liver function tests 01/02/2015   Hand cramps 11/15/2014   Leg cramps 06/13/2014    Osteopenia 03/02/2013   Hyperbilirubinemia 03/02/2013   Hypertension 12/22/2012   Gastroparesis 12/22/2012   Barrett's esophagus with esophagitis 12/22/2012   Hypothyroidism 12/22/2012   Anemia 12/22/2012   B12 deficiency 12/22/2012   CKD (chronic kidney disease) stage 3, GFR 30-59 ml/min ([facility]) 12/22/2012    REFERRING DIAG: M25.561 (ICD-10-CM) - Right knee pain, unspecified chronicity  THERAPY DIAG:  Muscle weakness (generalized)  Difficulty in walking, not elsewhere classified  Rationale for Evaluation and Treatment Rehabilitation  PERTINENT HISTORY: Pt was initially sent her by PCP for right knee pain from a patella fracture that she sustained back in February.  She occasionally feels pain in her left knee, but she does not identify this as a problem. Pt describes feeling like she is walking stiff where she is having to lift the right leg upstairs.  She feels afraid of falling and she walks slower as a result. She does not normally use a SPC with the exception of shopping in a grocery store.   PRECAUTIONS: Falls   SUBJECTIVE: Pt reports increased bilateral knee pain over her patella after last session on Thursday. She stayed off of her feet for an entire day on Friday, and then she returned to doing HEP. Since doing HEP yesterday, she has not felt increased knee pain.   PAIN:  Are you having pain? No   OBJECTIVE:                 BP 180/54 HR 58 SpO2 100      DIAGNOSTIC FINDINGS: CLINICAL DATA:  Right knee pain, fall   EXAM: RIGHT KNEE - 1-2 VIEW   COMPARISON:  None.   FINDINGS: Advanced tricompartment degenerative changes with joint space narrowing and spurring. Moderate joint effusion. No acute bony abnormality. Specifically, no fracture, subluxation, or dislocation.   IMPRESSION: No acute bony abnormality.  Degenerative changes and joint effusion.     Electronically Signed   By: Rolm Baptise M.D.   On: 10/16/2021 20:51   PATIENT SURVEYS:  FOTO 45/100 with  target of 52 01/01/23: 51/52   COGNITION:           Overall cognitive status: Within functional limits for tasks assessed                          SENSATION: WFL       MUSCLE LENGTH: Hamstrings: Right 60 deg; Left 60 deg     POSTURE: rounded shoulders and forward head   PALPATION: TTP of anterior surface of bilateral knees    LOWER EXTREMITY ROM:     Active /PROM Right 10/04/2022 Left 10/04/2022  Hip flexion 120 120  Hip extension 30 30  Hip abduction 45 45  Hip adduction 30 30  Hip internal rotation 45 45  Hip external rotation 45 45  Knee flexion 110/120 110/120  Knee extension 0 0  Ankle dorsiflexion      Ankle plantarflexion      Ankle inversion      Ankle eversion       (Blank rows = not tested)  LOWER EXTREMITY MMT:   MMT Right eval Left eval  Hip flexion 4 4  Hip extension 4- 4-  Hip abduction 4- 4-  Hip adduction 3+ 3+  Hip internal rotation      Hip external rotation      Knee flexion 4 4  Knee extension 4 4  Ankle dorsiflexion      Ankle plantarflexion      Ankle inversion      Ankle eversion       (Blank rows = not tested)   LOWER EXTREMITY SPECIAL TESTS:  Hip special tests: Marcello Moores test: negative and Ely's test: positive    FUNCTIONAL TESTS:  5 times sit to stand: 19 sec  30 seconds chair stand test: 9 reps  Dynamic Gait Index: NT    GAIT: Distance walked: 10 meters  Assistive device utilized: None Level of assistance: Complete Independence Comments: Decreased step length on RLE        TODAY'S TREATMENT:  01/01/23:   Therex           TM at 1 mph for 5 min           Standing Heel Raises with 1 UE support 3 x 10           Standing Marches 2 x 10 with 1 UE support           -Pt reports increased tension in front of quads           Standing Marches 2 x 10            -After performing quad stretch                     Standing HS Curls with 1 UE support 1 x 10                      Manual Therapy           Quad Trigger  Point Release and massage           Use of hand held roller on quads           Prone Quad Stretch with overpressure using strap 4 x 30 sec             12/27/22:                      TM at 1 mph for 5 min           Standing Heel Raises with 1 finger support  2 x 15            Standing Heel Raises with no UE support 1 x 15           Standing HS Curls with #3 AW and 1 finger support  2 x 15          Standing HS Curls with #3 AW   1 x 15          Standing Marches without UE support 1 x 10           Standing Marches with 1 finger  2 x 10           Standing Hip Abduction with BUE support 1 x 10           Standing Hip Abduction with 1 UE support 1 x 10           Standing Hip Abduction with  BUE support with #3 AW 1 x 10   12/25/22:   Nu-Step with seat and arms at 6 for 5 min. L2 resistance.  10 mWT: 15.23 sec and 14.51 sec= 14.87 sec   10/14.87= 0.67 m/sec     LE endurance and strengthening circuits:  500 meter walk   -Pt reports increased tightness in quads  Ely's Test: + Bilateral                        Prone Quad Stretch with PT providing overpressure 4 x 30 sec                        Prone Quad Stretch with strap 4 x 30 sec                        500 meter walk                        -Pt continues to feel tightness in her right quad                        Straight Leg Raise                        -Tension experienced at 70 deg hip flexion bilateral                        Modified Thomas Stretch on right hip 3 x 30 sec                        Seated Hamstring stretch 3 x 30 sec     12/20/22:   Nu-Step with seat and arms at 6 for 5 min. L2 resistance.  Side Lying Right Hip Abduction with #3 AW with emphasis on slow eccentric phase of exercise 2 x 10  Side Lying Right  Hip Abduction with red band 1 x 10  -Does not experience as much resistance on eccentric portion  Side Step Ups on 4 inch step on RLE with 1 UE support 1 x 5 -Pt unable to control concentric portion of exercise  without resting foot on ground  Hip Bumps against the Wall with RLE as stance leg  -mod VC to avoid lateral trunk lean       PATIENT EDUCATION:  Education details: form and education for appropriate exercise and explanation of tests and measures  Person educated: Patient Education method: Consulting civil engineer, Demonstration, Verbal cues, and Handouts Education comprehension: verbalized understanding, returned demonstration, and verbal cues required   HOME EXERCISES:    Access Code: [email or web address] URL: [email or web address] Date: 12/27/2022 Prepared by: Bradly Chris  Exercises - Prone Quadriceps Stretch with Strap  - 1 x daily - 3 reps - 30-60 sec  hold - Side Lunge Adductor Stretch  - 1 x daily - 3 reps - 30 hold - Seated Table Hamstring Stretch  - 1 x daily - 3 reps - 30-60 sec hold - Sit to Stand  - 3 x weekly - 3 sets - 10 reps - Sidelying Hip Adduction  - 3 x weekly - 3 sets - 10 reps - Modified Thomas Stretch  - 1 x daily - 3 reps - 30-60 sec  hold - Standing Heel Raises  - 3 x weekly - 3 sets - 15  reps - Standing Knee Flexion Strengthening at Chair  - 3 x weekly - 3 sets - 10 reps - Standing Hip Abduction with Counter Support  - 3 x weekly - 3 sets - 10 reps - Standing Hip Flexion  - 3 x weekly - 3 sets - 10 reps   ASSESSMENT:   CLINICAL IMPRESSION: Attempted same exercise routine or pt's HEP from last session to see if this reproduces her symptoms. She continues to be limited by hip flexor and quad length with increased tension when performing knee bends. Pain and discomfort did resolve some when performing stretches which enabled her to perform standing marches more easily. This shortened muscle length could also explain anterior knee pain given ongoing pull on patella. She will continue to benefit from skilled PT services to address remaining strength deficits to improve stair navigation.   OBJECTIVE IMPAIRMENTS Abnormal gait, decreased balance, decreased ROM, and  decreased strength.    ACTIVITY LIMITATIONS squatting, stairs, and locomotion level   PARTICIPATION LIMITATIONS: shopping, community activity, and yard work   [place] Age and 3+ comorbidities: chronic kidney disease, HTN, and h/o right knee patella fracture   are also affecting patient's functional outcome.    REHAB POTENTIAL: Good   CLINICAL DECISION MAKING: Stable/uncomplicated   EVALUATION COMPLEXITY: Low     GOALS: Goals reviewed with patient? No   SHORT TERM GOALS: Target date: 10/18/2022  Pt will be independent with HEP in order to improve strength and balance in order to decrease fall risk and improve function at home and work. Baseline: Performing independently  Goal status: Ongoing    2.  Pt will decrease 5TSTS by at least 3 seconds in order to demonstrate clinically significant improvement in LE strength.  Baseline: 19 sec   12/27/22: 16 sec  Goal status: Achieved    3.  Pt will increase 10MWT by at least 0.13 m/s in order to demonstrate clinically significant improvement in community ambulation. Baseline: 0.50 m/sec  11/06/22: 0.65 m/sec  Goal status: Achieved        LONG TERM GOALS: Target date: 12/13/2022    Patient will have improved function and activity level as evidenced by an increase in FOTO score by 10 points or more. Baseline: 43/100 with target of 52 01/01/23: 51 Goal status: Ongoing    2.  Patient will improve hip and knee strength by 1/3 MMT (I.e 4- to 4) for improved hip stability to improve steadiness of gait to decrease risk of falling.  Baseline: Hip Adduction R/L 3+/3+, Hip Abduction R/L 4-/4-, Hip Ext R/L  4-/-, Knee Flex R/L 4/4, Knee Ext R/L4/4 11/06/22: Hip Adduction R/L 4-/4-, Hip Abduction R/L 4/4, Hip Ext R/L  4/4, Knee Flex R/L 5/5, Knee Ext R/L 5/5 Goal status: Achieved    3.  Patient will demonstrate reduced falls risk as evidenced by Dynamic Gait Index (DGI) >19/24. Baseline:15/24  11/06/22: 19/24 Goal status: Partially Met      4.  Patient will improve 5 x STS to 11 sec to demonstrate improved LE strength that places her at a decreased risk for falls. Baseline: 19 sec   11/06/22: 24 sec   12/25/22: 16 sec  Goal status: Ongoing    5.  Patient will demonstrate a gait speed > 1.0 m/sec to show that she capable of a gait speed that places her at a decreased risk for falls.  Baseline: 0.50 m/sec 11/06/22: 0.65 m/sec 12/25/22: 0.67 m/sec  Goal status: Partially met    6.  Patient will perform 8>= sit to stands within 30 sec to show LE endurance that places her at a decreased risk for falls.  Baseline: 9 reps   11/06/22: 6 reps  12/25/22: 11 reps  Goal status: Achieved      PLAN: PT FREQUENCY: 1-2x/week   PT DURATION: 10 weeks   PLANNED INTERVENTIONS: Therapeutic exercises, Neuromuscular re-education, Balance training, Gait training, Joint mobilization, Joint manipulation, Stair training, DME instructions, Aquatic Therapy, Dry Needling, Spinal manipulation, Spinal mobilization, Cryotherapy, Moist heat, Manual therapy, and Re-evaluation   PLAN FOR NEXT SESSION:  Reassess goals for last time. Progress [place] exercises. Reassess hip strength. Put exercises in circuit with strengthening followed by overground walking  Bradly Chris PT, DPT  Physical Therapist- [facility]  01/01/2023, 3:56 PM

## 2023-01-03 ENCOUNTER — Encounter: Payer: Self-pay | Admitting: Physical Therapy

## 2023-01-03 ENCOUNTER — Ambulatory Visit: Payer: Medicare Other | Admitting: Physical Therapy

## 2023-01-03 DIAGNOSIS — M6281 Muscle weakness (generalized): Secondary | ICD-10-CM | POA: Diagnosis not present

## 2023-01-03 DIAGNOSIS — R262 Difficulty in walking, not elsewhere classified: Secondary | ICD-10-CM | POA: Diagnosis not present

## 2023-01-03 NOTE — Therapy (Signed)
OUTPATIENT PHYSICAL THERAPY DISCHARGE NOTE   Patient Name: Lori [name] MRN: [id number] DOB:05-16-1934, 89 y.o., female Today's Date: 01/03/2023  PCP: Dr. Einar Pheasant  REFERRING PROVIDER: Dr. Einar Pheasant   END OF SESSION:   PT End of Session - 01/03/23 1026     Visit Number 20    Number of Visits 20    Date for PT Re-Evaluation 02/14/23    Authorization Type Medicare    Authorization Time Period 12/14/22-02/14/23    Authorization - Number of Visits 20    Progress Note Due on Visit 20    PT Start Time 1020    PT Stop Time 1100    PT Time Calculation (min) 40 min    Equipment Utilized During Treatment Gait belt    Activity Tolerance Patient tolerated treatment well    Behavior During Therapy WFL for tasks assessed/performed                Past Medical History:  Diagnosis Date   Anemia    iron deficient   Arthritis    Autoimmune hepatitis ([place]) 05/10/2016   Hep C   B12 deficiency    Barrett esophagus 01/2016   Bradycardia    Chronic kidney disease    stage III   Diverticulosis    GERD (gastroesophageal reflux disease)    Hypertension    Hypothyroidism    Ulcer disease    Past Surgical History:  Procedure Laterality Date   CATARACT EXTRACTION W/PHACO Left 08/30/2021   Procedure: CATARACT EXTRACTION PHACO AND INTRAOCULAR LENS PLACEMENT ([place]) LEFT 8.21 01:00.3;  Surgeon: Leandrew Koyanagi, MD;  Location: [place];  Service: Ophthalmology;  Laterality: Left;   CATARACT EXTRACTION W/PHACO Right 09/13/2021   Procedure: CATARACT EXTRACTION PHACO AND INTRAOCULAR LENS PLACEMENT ([place]) RIGHT;  Surgeon: Leandrew Koyanagi, MD;  Location: [place];  Service: Ophthalmology;  Laterality: Right;  8.11 00:59.8   CHOLECYSTECTOMY N/A 06/25/2018   Procedure: LAPAROSCOPIC CHOLECYSTECTOMY WITH INTRAOPERATIVE CHOLANGIOGRAM;  Surgeon: Robert Bellow, MD;  Location: [facility];  Service: General;  Laterality: N/A;   ESOPHAGOGASTRODUODENOSCOPY  (EGD) WITH PROPOFOL N/A 01/30/2016   Procedure: ESOPHAGOGASTRODUODENOSCOPY (EGD) WITH PROPOFOL;  Surgeon: Lollie Sails, MD;  Location: [facility] ENDOSCOPY;  Service: Endoscopy;  Laterality: N/A;   ESOPHAGOGASTRODUODENOSCOPY (EGD) WITH PROPOFOL N/A 01/14/2018   Procedure: ESOPHAGOGASTRODUODENOSCOPY (EGD) WITH PROPOFOL;  Surgeon: Lollie Sails, MD;  Location: [facility]COPY;  Service: Endoscopy;  Laterality: N/A;   ESOPHAGOGASTRODUODENOSCOPY (EGD) WITH PROPOFOL N/A 05/20/2018   Procedure: ESOPHAGOGASTRODUODENOSCOPY (EGD) WITH PROPOFOL;  Surgeon: Lollie Sails, MD;  Location: [facility] ENDOSCOPY;  Service: Endoscopy;  Laterality: N/A;   ESOPHAGOGASTRODUODENOSCOPY (EGD) WITH PROPOFOL N/A 01/13/2019   Procedure: ESOPHAGOGASTRODUODENOSCOPY (EGD) WITH PROPOFOL;  Surgeon: Lollie Sails, MD;  Location: [facility] ENDOSCOPY;  Service: Endoscopy;  Laterality: N/A;   HERNIA REPAIR     lap hiatal hernia repair  07/08/08   LEFT OOPHORECTOMY  1993   benign ovarian cyst   TUBAL LIGATION  1981   Patient Active Problem List   Diagnosis Date Noted   Bladder prolapse, female, acquired 05/06/2022   Hematoma 10/22/2021   Right knee pain 10/13/2021   SVT (supraventricular tachycardia) 10/07/2021   PSVT (paroxysmal supraventricular tachycardia) 10/06/2021   Numbness in feet 06/18/2021   CKD (chronic kidney disease) 06/12/2021   Dizziness 04/10/2021   Chest tightness 02/07/2021   Diarrhea 10/09/2020   Abdominal discomfort 09/27/2019   Gall bladder polyp 05/07/2018   Breast tenderness 05/07/2018   Hyperglycemia 09/10/2016   Fullness  of breast 09/10/2016   Autoimmune hepatitis ([place]) 05/10/2016   MGUS (monoclonal gammopathy of unknown significance) 06/06/2015   LUQ fullness 06/06/2015   Health care maintenance 06/06/2015   Fatty tumor 03/07/2015   BMI 32.0-32.9,adult 01/02/2015   Abnormal liver function tests 01/02/2015   Hand cramps 11/15/2014   Leg cramps 06/13/2014   Osteopenia 03/02/2013    Hyperbilirubinemia 03/02/2013   Hypertension 12/22/2012   Gastroparesis 12/22/2012   Barrett's esophagus with esophagitis 12/22/2012   Hypothyroidism 12/22/2012   Anemia 12/22/2012   B12 deficiency 12/22/2012   CKD (chronic kidney disease) stage 3, GFR 30-59 ml/min (HCC) 12/22/2012    REFERRING DIAG: M25.561 (ICD-10-CM) - Right knee pain, unspecified chronicity  THERAPY DIAG:  Muscle weakness (generalized)  Difficulty in walking, not elsewhere classified  Rationale for Evaluation and Treatment Rehabilitation  PERTINENT HISTORY: Pt was initially sent her by PCP for right knee pain from a patella fracture that she sustained back in February.  She occasionally feels pain in her left knee, but she does not identify this as a problem. Pt describes feeling like she is walking stiff where she is having to lift the right leg upstairs.  She feels afraid of falling and she walks slower as a result. She does not normally use a SPC with the exception of shopping in a grocery store.   PRECAUTIONS: Falls   SUBJECTIVE: Pt reports that she continues to do quad stretch which feels like it is helping. She still feels like she is walking slow but this is to be cautious out of fear of unsteadiness.  PAIN:  Are you having pain? No   OBJECTIVE:                 BP 180/54 HR 58 SpO2 100      DIAGNOSTIC FINDINGS: CLINICAL DATA:  Right knee pain, fall   EXAM: RIGHT KNEE - 1-2 VIEW   COMPARISON:  None.   FINDINGS: Advanced tricompartment degenerative changes with joint space narrowing and spurring. Moderate joint effusion. No acute bony abnormality. Specifically, no fracture, subluxation, or dislocation.   IMPRESSION: No acute bony abnormality.  Degenerative changes and joint effusion.     Electronically Signed   By: Rolm Baptise M.D.   On: 10/16/2021 20:51   PATIENT SURVEYS:  FOTO 45/100 with target of 52 01/01/23: 51/52   COGNITION:           Overall cognitive status: Within functional  limits for tasks assessed                          SENSATION: WFL       MUSCLE LENGTH: Hamstrings: Right 60 deg; Left 60 deg     POSTURE: rounded shoulders and forward head   PALPATION: TTP of anterior surface of bilateral knees    LOWER EXTREMITY ROM:     Active /PROM Right 10/04/2022 Left 10/04/2022  Hip flexion 120 120  Hip extension 30 30  Hip abduction 45 45  Hip adduction 30 30  Hip internal rotation 45 45  Hip external rotation 45 45  Knee flexion 110/120 110/120  Knee extension 0 0  Ankle dorsiflexion      Ankle plantarflexion      Ankle inversion      Ankle eversion       (Blank rows = not tested)        LOWER EXTREMITY MMT:   MMT Right eval Left eval  Hip flexion 4 4  Hip extension 4- 4-  Hip abduction 4- 4-  Hip adduction 3+ 3+  Hip internal rotation      Hip external rotation      Knee flexion 4 4  Knee extension 4 4  Ankle dorsiflexion      Ankle plantarflexion      Ankle inversion      Ankle eversion       (Blank rows = not tested)   LOWER EXTREMITY SPECIAL TESTS:  Hip special tests: Marcello Moores test: negative and Ely's test: positive    FUNCTIONAL TESTS:  5 times sit to stand: 19 sec  30 seconds chair stand test: 9 reps  Dynamic Gait Index: NT    GAIT: Distance walked: 10 meters  Assistive device utilized: None Level of assistance: Complete Independence Comments: Decreased step length on RLE        TODAY'S TREATMENT:  01/03/23:   Therex          TM at 1.3 mph for 5 min           Seated HS curls 3 x 30 sec           Standing HS curls with BUE 3 x 10           -Still feels some pull in HS but not painful                      01/01/23:   Therex           TM at 1 mph for 5 min           Standing Heel Raises with 1 UE support 3 x 10           Standing Marches 2 x 10 with 1 UE support           -Pt reports increased tension in front of quads           Standing Marches 2 x 10            -After performing quad stretch                      Standing HS Curls with 1 UE support 1 x 10                      Manual Therapy           Quad Trigger Point Release and massage           Use of hand held roller on quads           Prone Quad Stretch with overpressure using strap 4 x 30 sec             12/27/22:                      TM at 1 mph for 5 min           Standing Heel Raises with 1 finger support  2 x 15            Standing Heel Raises with no UE support 1 x 15           Standing HS Curls with #3 AW and 1 finger support  2 x 15          Standing HS Curls with #3 AW   1 x 15          Standing  Marches without UE support 1 x 10           Standing Marches with 1 finger  2 x 10           Standing Hip Abduction with BUE support 1 x 10           Standing Hip Abduction with 1 UE support 1 x 10           Standing Hip Abduction with BUE support with #3 AW 1 x 10   12/25/22:   Nu-Step with seat and arms at 6 for 5 min. L2 resistance.  10 mWT: 15.23 sec and 14.51 sec= 14.87 sec   10/14.87= 0.67 m/sec     LE endurance and strengthening circuits:  500 meter walk   -Pt reports increased tightness in quads  Ely's Test: + Bilateral                        Prone Quad Stretch with PT providing overpressure 4 x 30 sec                        Prone Quad Stretch with strap 4 x 30 sec                        500 meter walk                        -Pt continues to feel tightness in her right quad                        Straight Leg Raise                        -Tension experienced at 70 deg hip flexion bilateral                        Modified Thomas Stretch on right hip 3 x 30 sec                        Seated Hamstring stretch 3 x 30 sec     PATIENT EDUCATION:  Education details: form and education for appropriate exercise and explanation of tests and measures  Person educated: Patient Education method: Consulting civil engineer, Demonstration, Verbal cues, and Handouts Education comprehension: verbalized understanding, returned demonstration,  and verbal cues required   HOME EXERCISES:   Access Code: [email or web address] URL: [email or web address] Date: 01/03/2023 Prepared by: Bradly Chris  Exercises - Prone Quadriceps Stretch with Strap  - 1 x daily - 3 reps - 30-60 sec  hold - Seated Hamstring Stretch  - 1 x daily - 3 reps - 60 sec  hold - Side Lunge Adductor Stretch  - 1 x daily - 3 reps - 30 hold - Sit to Stand  - 3 x weekly - 3 sets - 10 reps - Sidelying Hip Adduction  - 3 x weekly - 3 sets - 10 reps - Modified Thomas Stretch  - 1 x daily - 3 reps - 30-60 sec  hold - Standing Heel Raises  - 3 x weekly - 3 sets - 15 reps - Standing Knee Flexion Strengthening at Chair  - 3 x weekly - 3 sets - 10 reps - Standing Hip Abduction with Counter Support  - 3 x weekly - 3 sets -  10 reps - Standing Hip Flexion  - 3 x weekly - 3 sets - 10 reps  ASSESSMENT:   CLINICAL IMPRESSION:  Despite pt not reaching all of her goals, she demonstrates an improvement in physical function that makes her safe to discharge from PT and live independently. Pt given robust home exercise plan to continue to maintain strength and balance gains.   Attempted same exercise routine or pt's HEP from last session to see if this reproduces her symptoms. She continues to be limited by hip flexor and quad length with increased tension when performing knee bends. Pain and discomfort did resolve some when performing stretches which enabled her to perform standing marches more easily. This shortened muscle length could also explain anterior knee pain given ongoing pull on patella. She will continue to benefit from skilled PT services to address remaining strength deficits to improve stair navigation.   OBJECTIVE IMPAIRMENTS Abnormal gait, decreased balance, decreased ROM, and decreased strength.    ACTIVITY LIMITATIONS squatting, stairs, and locomotion level   PARTICIPATION LIMITATIONS: shopping, community activity, and yard work   [place] Age and  3+ comorbidities: chronic kidney disease, HTN, and h/o right knee patella fracture   are also affecting patient's functional outcome.    REHAB POTENTIAL: Good   CLINICAL DECISION MAKING: Stable/uncomplicated   EVALUATION COMPLEXITY: Low     GOALS: Goals reviewed with patient? No   SHORT TERM GOALS: Target date: 10/18/2022  Pt will be independent with HEP in order to improve strength and balance in order to decrease fall risk and improve function at home and work. Baseline: Performing independently  Goal status: Able to perform independently    2.  Pt will decrease 5TSTS by at least 3 seconds in order to demonstrate clinically significant improvement in LE strength.  Baseline: 19 sec   12/27/22: 16 sec  Goal status: Achieved    3.  Pt will increase 10MWT by at least 0.13 m/s in order to demonstrate clinically significant improvement in community ambulation. Baseline: 0.50 m/sec  11/06/22: 0.65 m/sec  Goal status: Achieved        LONG TERM GOALS: Target date: 12/13/2022    Patient will have improved function and activity level as evidenced by an increase in FOTO score by 10 points or more. Baseline: 43/100 with target of 52 01/01/23: 51 Goal status: Ongoing    2.  Patient will improve hip and knee strength by 1/3 MMT (I.e 4- to 4) for improved hip stability to improve steadiness of gait to decrease risk of falling.  Baseline: Hip Adduction R/L 3+/3+, Hip Abduction R/L 4-/4-, Hip Ext R/L  4-/-, Knee Flex R/L 4/4, Knee Ext R/L4/4 11/06/22: Hip Adduction R/L 4-/4-, Hip Abduction R/L 4/4, Hip Ext R/L  4/4, Knee Flex R/L 5/5, Knee Ext R/L 5/5 Goal status: Achieved    3.  Patient will demonstrate reduced falls risk as evidenced by Dynamic Gait Index (DGI) >19/24. Baseline:15/24  11/06/22: 19/24 Goal status: Not Met     4.  Patient will improve 5 x STS to 11 sec to demonstrate improved LE strength that places her at a decreased risk for falls. Baseline: 19 sec   11/06/22: 24 sec    12/25/22: 16 sec  Goal status: Not Met    5.  Patient will demonstrate a gait speed > 1.0 m/sec to show that she capable of a gait speed that places her at a decreased risk for falls.  Baseline: 0.50 m/sec 11/06/22: 0.65 m/sec 12/25/22: 0.67  m/sec  Goal status: Partially met    6.  Patient will perform 8>= sit to stands within 30 sec to show LE endurance that places her at a decreased risk for falls.  Baseline: 9 reps   11/06/22: 6 reps  12/25/22: 11 reps  Goal status: Achieved      PLAN: PT FREQUENCY: 1-2x/week   PT DURATION: 10 weeks   PLANNED INTERVENTIONS: Therapeutic exercises, Neuromuscular re-education, Balance training, Gait training, Joint mobilization, Joint manipulation, Stair training, DME instructions, Aquatic Therapy, Dry Needling, Spinal manipulation, Spinal mobilization, Cryotherapy, Moist heat, Manual therapy, and Re-evaluation   PLAN FOR NEXT SESSION:  Discharge from PT. Has HEP to follow to maintain gains   Bradly Chris PT, DPT  Physical Therapist- [facility]  01/03/2023, 10:27 AM

## 2023-01-08 ENCOUNTER — Ambulatory Visit: Payer: Medicare Other | Admitting: Physical Therapy

## 2023-01-14 ENCOUNTER — Ambulatory Visit (INDEPENDENT_AMBULATORY_CARE_PROVIDER_SITE_OTHER): Payer: Medicare Other

## 2023-01-14 DIAGNOSIS — E538 Deficiency of other specified B group vitamins: Secondary | ICD-10-CM

## 2023-01-14 MED ORDER — CYANOCOBALAMIN 1000 MCG/ML IJ SOLN
1000.0000 ug | Freq: Once | INTRAMUSCULAR | Status: AC
  Administered 2023-01-14: 1000 ug via INTRAMUSCULAR

## 2023-01-14 NOTE — Progress Notes (Signed)
Pt presented for their vitamin B12 injection. Pt was identified through two identifiers. Pt tolerated shot well in their left  deltoid.  

## 2023-01-15 ENCOUNTER — Ambulatory Visit: Payer: Medicare Other | Admitting: Physical Therapy

## 2023-01-22 ENCOUNTER — Encounter: Payer: Medicare Other | Admitting: Physical Therapy

## 2023-01-24 ENCOUNTER — Encounter: Payer: Medicare Other | Admitting: Physical Therapy

## 2023-01-29 ENCOUNTER — Encounter: Payer: Medicare Other | Admitting: Physical Therapy

## 2023-01-30 DIAGNOSIS — H903 Sensorineural hearing loss, bilateral: Secondary | ICD-10-CM | POA: Diagnosis not present

## 2023-01-31 DIAGNOSIS — H903 Sensorineural hearing loss, bilateral: Secondary | ICD-10-CM | POA: Diagnosis not present

## 2023-01-31 DIAGNOSIS — H6123 Impacted cerumen, bilateral: Secondary | ICD-10-CM | POA: Diagnosis not present

## 2023-02-14 ENCOUNTER — Ambulatory Visit (INDEPENDENT_AMBULATORY_CARE_PROVIDER_SITE_OTHER): Payer: Medicare Other

## 2023-02-14 DIAGNOSIS — E538 Deficiency of other specified B group vitamins: Secondary | ICD-10-CM

## 2023-02-14 MED ORDER — CYANOCOBALAMIN 1000 MCG/ML IJ SOLN
1000.0000 ug | Freq: Once | INTRAMUSCULAR | Status: AC
  Administered 2023-02-14: 1000 ug via INTRAMUSCULAR

## 2023-02-14 NOTE — Progress Notes (Signed)
Patient presented for B 12 injection to left deltoid, patient voiced no concerns nor showed any signs of distress during injection. 

## 2023-02-15 ENCOUNTER — Other Ambulatory Visit: Payer: Self-pay | Admitting: Internal Medicine

## 2023-02-15 NOTE — Telephone Encounter (Signed)
Refilled: 08/15/2022 Last OV: 12/18/2022 Next OV: 04/19/2023

## 2023-02-16 NOTE — Telephone Encounter (Signed)
Rx ok'd for [employer]

## 2023-02-22 ENCOUNTER — Other Ambulatory Visit: Payer: Self-pay | Admitting: Internal Medicine

## 2023-02-22 NOTE — Telephone Encounter (Signed)
Refill sent to pharmacy.   

## 2023-03-11 ENCOUNTER — Other Ambulatory Visit: Payer: Self-pay | Admitting: Internal Medicine

## 2023-03-12 DIAGNOSIS — K2270 Barrett's esophagus without dysplasia: Secondary | ICD-10-CM | POA: Diagnosis not present

## 2023-03-12 DIAGNOSIS — K754 Autoimmune hepatitis: Secondary | ICD-10-CM | POA: Diagnosis not present

## 2023-03-12 DIAGNOSIS — K219 Gastro-esophageal reflux disease without esophagitis: Secondary | ICD-10-CM | POA: Diagnosis not present

## 2023-03-12 DIAGNOSIS — K52831 Collagenous colitis: Secondary | ICD-10-CM | POA: Diagnosis not present

## 2023-03-13 ENCOUNTER — Other Ambulatory Visit: Payer: Self-pay | Admitting: Gastroenterology

## 2023-03-13 DIAGNOSIS — K754 Autoimmune hepatitis: Secondary | ICD-10-CM

## 2023-03-18 ENCOUNTER — Telehealth: Payer: Self-pay | Admitting: Internal Medicine

## 2023-03-18 ENCOUNTER — Ambulatory Visit (INDEPENDENT_AMBULATORY_CARE_PROVIDER_SITE_OTHER): Payer: Medicare Other

## 2023-03-18 DIAGNOSIS — E538 Deficiency of other specified B group vitamins: Secondary | ICD-10-CM

## 2023-03-18 MED ORDER — CYANOCOBALAMIN 1000 MCG/ML IJ SOLN
1000.0000 ug | Freq: Once | INTRAMUSCULAR | Status: AC
  Administered 2023-03-18: 1000 ug via INTRAMUSCULAR

## 2023-03-18 NOTE — Progress Notes (Signed)
Pt presented for their vitamin B12 injection. Pt was identified through two identifiers. Pt tolerated shot well in their right deltoid.  

## 2023-03-18 NOTE — Telephone Encounter (Signed)
Will update in chart

## 2023-03-18 NOTE — Telephone Encounter (Signed)
Patient dropped off a note, change in pharmacy. Note is up front in Dr [employer] color folder.

## 2023-03-25 ENCOUNTER — Ambulatory Visit
Admission: RE | Admit: 2023-03-25 | Discharge: 2023-03-25 | Disposition: A | Discharge status: Home / Self Care | Payer: Medicare Other | Source: Ambulatory Visit | Attending: Gastroenterology | Admitting: Gastroenterology

## 2023-03-25 DIAGNOSIS — K754 Autoimmune hepatitis: Secondary | ICD-10-CM | POA: Insufficient documentation

## 2023-03-25 DIAGNOSIS — N281 Cyst of kidney, acquired: Secondary | ICD-10-CM | POA: Diagnosis not present

## 2023-04-11 DIAGNOSIS — R001 Bradycardia, unspecified: Secondary | ICD-10-CM | POA: Diagnosis not present

## 2023-04-11 DIAGNOSIS — I10 Essential (primary) hypertension: Secondary | ICD-10-CM | POA: Diagnosis not present

## 2023-04-11 DIAGNOSIS — I471 Supraventricular tachycardia, unspecified: Secondary | ICD-10-CM | POA: Diagnosis not present

## 2023-04-16 DIAGNOSIS — H35372 Puckering of macula, left eye: Secondary | ICD-10-CM | POA: Diagnosis not present

## 2023-04-19 ENCOUNTER — Ambulatory Visit (INDEPENDENT_AMBULATORY_CARE_PROVIDER_SITE_OTHER): Payer: Medicare Other | Admitting: Internal Medicine

## 2023-04-19 ENCOUNTER — Encounter: Payer: Self-pay | Admitting: Internal Medicine

## 2023-04-19 VITALS — BP 120/64 | HR 56 | Temp 98.8°F | Resp 16 | Ht 61.0 in | Wt 140.2 lb

## 2023-04-19 DIAGNOSIS — I10 Essential (primary) hypertension: Secondary | ICD-10-CM

## 2023-04-19 DIAGNOSIS — D649 Anemia, unspecified: Secondary | ICD-10-CM

## 2023-04-19 DIAGNOSIS — R739 Hyperglycemia, unspecified: Secondary | ICD-10-CM | POA: Diagnosis not present

## 2023-04-19 DIAGNOSIS — E538 Deficiency of other specified B group vitamins: Secondary | ICD-10-CM | POA: Diagnosis not present

## 2023-04-19 DIAGNOSIS — K2270 Barrett's esophagus without dysplasia: Secondary | ICD-10-CM

## 2023-04-19 DIAGNOSIS — Z0000 Encounter for general adult medical examination without abnormal findings: Secondary | ICD-10-CM

## 2023-04-19 DIAGNOSIS — K209 Esophagitis, unspecified without bleeding: Secondary | ICD-10-CM

## 2023-04-19 DIAGNOSIS — E039 Hypothyroidism, unspecified: Secondary | ICD-10-CM | POA: Diagnosis not present

## 2023-04-19 DIAGNOSIS — I471 Supraventricular tachycardia, unspecified: Secondary | ICD-10-CM | POA: Diagnosis not present

## 2023-04-19 DIAGNOSIS — G479 Sleep disorder, unspecified: Secondary | ICD-10-CM | POA: Diagnosis not present

## 2023-04-19 DIAGNOSIS — N281 Cyst of kidney, acquired: Secondary | ICD-10-CM

## 2023-04-19 DIAGNOSIS — D472 Monoclonal gammopathy: Secondary | ICD-10-CM | POA: Diagnosis not present

## 2023-04-19 DIAGNOSIS — K754 Autoimmune hepatitis: Secondary | ICD-10-CM | POA: Diagnosis not present

## 2023-04-19 DIAGNOSIS — Z13220 Encounter for screening for lipoid disorders: Secondary | ICD-10-CM

## 2023-04-19 DIAGNOSIS — N1831 Chronic kidney disease, stage 3a: Secondary | ICD-10-CM | POA: Diagnosis not present

## 2023-04-19 DIAGNOSIS — R7989 Other specified abnormal findings of blood chemistry: Secondary | ICD-10-CM

## 2023-04-19 LAB — HEPATIC FUNCTION PANEL
ALT: 9 U/L (ref 0–35)
AST: 16 U/L (ref 0–37)
Albumin: 4.2 g/dL (ref 3.5–5.2)
Alkaline Phosphatase: 50 U/L (ref 39–117)
Bilirubin, Direct: 0.2 mg/dL (ref 0.0–0.3)
Total Bilirubin: 1.4 mg/dL — ABNORMAL HIGH (ref 0.2–1.2)
Total Protein: 7.3 g/dL (ref 6.0–8.3)

## 2023-04-19 LAB — LIPID PANEL
Cholesterol: 163 mg/dL (ref 0–200)
HDL: 44.6 mg/dL (ref >39.00)
LDL Cholesterol: 91 mg/dL (ref 0–99)
NonHDL: 118.36
Total CHOL/HDL Ratio: 4
Triglycerides: 138 mg/dL (ref 0.0–149.0)
VLDL: 27.6 mg/dL (ref 0.0–40.0)

## 2023-04-19 LAB — BASIC METABOLIC PANEL
BUN: 31 mg/dL — ABNORMAL HIGH (ref 6–23)
CO2: 25 mEq/L (ref 19–32)
Calcium: 9.9 mg/dL (ref 8.4–10.5)
Chloride: 102 mEq/L (ref 96–112)
Creatinine, Ser: 1.04 mg/dL (ref 0.40–1.20)
GFR: 48.65 mL/min — ABNORMAL LOW (ref >60.00)
Glucose, Bld: 102 mg/dL — ABNORMAL HIGH (ref 70–99)
Potassium: 4.2 mEq/L (ref 3.5–5.1)
Sodium: 136 mEq/L (ref 135–145)

## 2023-04-19 LAB — HEMOGLOBIN A1C: Hgb A1c MFr Bld: 5.6 % (ref 4.6–6.5)

## 2023-04-19 MED ORDER — CYANOCOBALAMIN 1000 MCG/ML IJ SOLN
1000.0000 ug | Freq: Once | INTRAMUSCULAR | Status: AC
Start: 2023-04-19 — End: 2023-04-19
  Administered 2023-04-19: 1000 ug via INTRAMUSCULAR

## 2023-04-19 NOTE — Assessment & Plan Note (Signed)
Physical today 04/19/23.   Mammogram 07/30/22- birads I.  Followed by GI.

## 2023-04-19 NOTE — Progress Notes (Signed)
Subjective:    Patient ID: Lori [name], female    DOB: 1934/03/17, 89 y.o.   MRN: [id number]  Patient here for  Chief Complaint  Patient presents with   B12 Injection   Follow-up    Yearly follow up    HPI With past history of hypertension, bradycardia/SVT, hypothyroidism and anemia.  She comes in today to follow up on these issues as well as for a complete physical exam.  On sotalol and benazepril. Saw cardiology 04/11/23 - stable - no changes.  Saw GI 03/12/23 - f/u GERD/Barretts.  Upper symptoms controlled on protonix.  Collagenous colitis - relatively stable on mesalamine.  Still with intermittent flares.  Overall better than previous. Had f/u ultrasound - liver due to AIH. Ok. No chest pain or sob reported.  No cough or congestion.     Past Medical History:  Diagnosis Date   Anemia    iron deficient   Arthritis    Autoimmune hepatitis 05/10/2016   Hep C   B12 deficiency    Barrett esophagus 01/2016   Bradycardia    Chronic kidney disease    stage III   Diverticulosis    GERD (gastroesophageal reflux disease)    Hypertension    Hypothyroidism    Ulcer disease    Past Surgical History:  Procedure Laterality Date   CATARACT EXTRACTION W/PHACO Left 08/30/2021   Procedure: CATARACT EXTRACTION PHACO AND INTRAOCULAR LENS PLACEMENT (IOC) LEFT 8.21 01:00.3;  Surgeon: Lockie Mola, MD;  Location: [facility];  Service: Ophthalmology;  Laterality: Left;   CATARACT EXTRACTION W/PHACO Right 09/13/2021   Procedure: CATARACT EXTRACTION PHACO AND INTRAOCULAR LENS PLACEMENT (IOC) RIGHT;  Surgeon: Lockie Mola, MD;  Location: [facility];  Service: Ophthalmology;  Laterality: Right;  8.11 00:59.8   CHOLECYSTECTOMY N/A 06/25/2018   Procedure: LAPAROSCOPIC CHOLECYSTECTOMY WITH INTRAOPERATIVE CHOLANGIOGRAM;  Surgeon: Earline Mayotte, MD;  Location: [facility];  Service: General;  Laterality: N/A;   ESOPHAGOGASTRODUODENOSCOPY (EGD) WITH PROPOFOL N/A  01/30/2016   Procedure: ESOPHAGOGASTRODUODENOSCOPY (EGD) WITH PROPOFOL;  Surgeon: Christena Deem, MD;  Location: [facility];  Service: Endoscopy;  Laterality: N/A;   ESOPHAGOGASTRODUODENOSCOPY (EGD) WITH PROPOFOL N/A 01/14/2018   Procedure: ESOPHAGOGASTRODUODENOSCOPY (EGD) WITH PROPOFOL;  Surgeon: Christena Deem, MD;  Location: [facility]COPY;  Service: Endoscopy;  Laterality: N/A;   ESOPHAGOGASTRODUODENOSCOPY (EGD) WITH PROPOFOL N/A 05/20/2018   Procedure: ESOPHAGOGASTRODUODENOSCOPY (EGD) WITH PROPOFOL;  Surgeon: Christena Deem, MD;  Location: [facility];  Service: Endoscopy;  Laterality: N/A;   ESOPHAGOGASTRODUODENOSCOPY (EGD) WITH PROPOFOL N/A 01/13/2019   Procedure: ESOPHAGOGASTRODUODENOSCOPY (EGD) WITH PROPOFOL;  Surgeon: Christena Deem, MD;  Location: [facility] ENDOSCOPY;  Service: Endoscopy;  Laterality: N/A;   HERNIA REPAIR     lap hiatal hernia repair  07/08/08   LEFT OOPHORECTOMY  1993   benign ovarian cyst   TUBAL LIGATION  1981   Family History  Problem Relation Age of Onset   Stroke Mother    Breast cancer Neg Hx    Colon cancer Neg Hx    Social History   Socioeconomic History   Marital status: Widowed    Spouse name: Not on file   Number of children: 0   Years of education: Not on file   Highest education level: Associate degree: occupational, Scientist, product/process development, or vocational program  Occupational History   Not on file  Tobacco Use   Smoking status: Never   Smokeless tobacco: Never  Vaping Use   Vaping Use: Never used  Substance and Sexual  Activity   Alcohol use: No    Alcohol/week: 0.0 standard drinks of alcohol   Drug use: No   Sexual activity: Never  Other Topics Concern   Not on file  Social History Narrative   Not on file   Social Determinants of Health   Financial Resource Strain: Low Risk  (04/15/2023)   Overall Financial Resource Strain (CARDIA)    Difficulty of Paying Living Expenses: Not very hard  Food Insecurity: No Food Insecurity  (04/15/2023)   Hunger Vital Sign    Worried About Running Out of Food in the Last Year: Never true    Ran Out of Food in the Last Year: Never true  Transportation Needs: No Transportation Needs (04/15/2023)   PRAPARE - Administrator, Civil Service (Medical): No    Lack of Transportation (Non-Medical): No  Physical Activity: Sufficiently Active (04/15/2023)   Exercise Vital Sign    Days of Exercise per Week: 4 days    Minutes of Exercise per Session: 120 min  Stress: No Stress Concern Present (04/15/2023)   [employer] of Occupational Health - Occupational Stress Questionnaire    Feeling of Stress : Only a little  Social Connections: Moderately Integrated (04/15/2023)   Social Connection and Isolation Panel [NHANES]    Frequency of Communication with Friends and Family: More than three times a week    Frequency of Social Gatherings with Friends and Family: Once a week    Attends Religious Services: More than 4 times per year    Active Member of [employer] or Organizations: Yes    Attends Banker Meetings: More than 4 times per year    Marital Status: Widowed     Review of Systems  Constitutional:  Negative for appetite change and unexpected weight change.  HENT:  Negative for congestion, sinus pressure and sore throat.   Eyes:  Negative for pain and visual disturbance.  Respiratory:  Negative for cough, chest tightness and shortness of breath.   Cardiovascular:  Negative for chest pain and palpitations.  Gastrointestinal:  Negative for abdominal pain, diarrhea, nausea and vomiting.  Genitourinary:  Negative for difficulty urinating and dysuria.  Musculoskeletal:  Negative for joint swelling and myalgias.  Skin:  Negative for color change and rash.  Neurological:  Negative for dizziness and headaches.  Hematological:  Negative for adenopathy. Does not bruise/bleed easily.  Psychiatric/Behavioral:  Negative for agitation and dysphoric mood.         Objective:     BP 120/64   Pulse (!) 56   Temp 98.8 F (37.1 C)   Resp 16   Ht 5\' 1"  (1.549 m)   Wt 140 lb 4 oz (63.6 kg)   SpO2 96%   BMI 26.50 kg/m  Wt Readings from Last 3 Encounters:  04/19/23 140 lb 4 oz (63.6 kg)  12/18/22 143 lb 9.6 oz (65.1 kg)  08/28/22 142 lb (64.4 kg)    Physical Exam Vitals reviewed.  Constitutional:      General: She is not in acute distress.    Appearance: Normal appearance. She is well-developed.  HENT:     Head: Normocephalic and atraumatic.     Right Ear: External ear normal.     Left Ear: External ear normal.  Eyes:     General: No scleral icterus.       Right eye: No discharge.        Left eye: No discharge.     Conjunctiva/sclera: Conjunctivae normal.  Neck:  Thyroid: No thyromegaly.  Cardiovascular:     Rate and Rhythm: Normal rate and regular rhythm.  Pulmonary:     Effort: No tachypnea, accessory muscle usage or respiratory distress.     Breath sounds: Normal breath sounds. No decreased breath sounds or wheezing.  Chest:  Breasts:    Right: No inverted nipple, mass, nipple discharge or tenderness (no axillary adenopathy).     Left: No inverted nipple, mass, nipple discharge or tenderness (no axilarry adenopathy).  Abdominal:     General: Bowel sounds are normal.     Palpations: Abdomen is soft.     Tenderness: There is no abdominal tenderness.  Musculoskeletal:        General: No swelling or tenderness.     Cervical back: Neck supple.  Lymphadenopathy:     Cervical: No cervical adenopathy.  Skin:    Findings: No erythema or rash.  Neurological:     Mental Status: She is alert and oriented to person, place, and time.  Psychiatric:        Mood and Affect: Mood normal.        Behavior: Behavior normal.      Outpatient Encounter Medications as of 04/19/2023  Medication Sig   aspirin 81 MG tablet Take 81 mg by mouth daily.   benazepril (LOTENSIN) 20 MG tablet Take 1 tablet by mouth 2 (two) times daily.    calcitonin, salmon, (MIACALCIN/FORTICAL) 200 UNIT/ACT nasal spray ONE SPRAY ALTERNATING NOSTRILS EVERY OTHER DAY   Calcium-Phosphorus-Vitamin D (CITRACAL +D3 PO) Take 1 tablet by mouth daily.   chlorzoxazone (PARAFON) 500 MG tablet TAKE 1 TABLET BY MOUTH EVERY DAY AT BEDTIME AS NEEDED. DO NOT TAKE WITH AMBIEN.   Cyanocobalamin (B-12) 1000 MCG/ML KIT Inject 1,000 mcg as directed every 30 (thirty) days.    GLUCOSAMINE-CHONDROITIN PO Take 1 tablet by mouth 2 (two) times daily.    levothyroxine (SYNTHROID) 88 MCG tablet Take 1 tablet (88 mcg total) by mouth daily.   Magnesium Oxide 400 (240 Mg) MG TABS Take 1 tablet (400 mg total) by mouth 2 (two) times daily.   Mesalamine (ASACOL) 400 MG CPDR DR capsule Take 400 mg by mouth 2 (two) times daily.    pantoprazole (PROTONIX) 40 MG tablet Take 40 mg by mouth daily.   sotalol (BETAPACE) 80 MG tablet Take 0.5 mg by mouth daily.   Wheat Dextrin (BENEFIBER DRINK MIX) PACK Take 1 each by mouth daily.    [DISCONTINUED] zolpidem (AMBIEN CR) 6.25 MG CR tablet TAKE 1 TABLET BY MOUTH AT BEDTIME AS NEEDED FOR SLEEP.   zolpidem (AMBIEN CR) 6.25 MG CR tablet Take 1 tablet (6.25 mg total) by mouth at bedtime as needed. for sleep   [EXPIRED] cyanocobalamin (VITAMIN B12) injection 1,000 mcg    No facility-administered encounter medications on file as of 04/19/2023.     Lab Results  Component Value Date   WBC 6.3 12/18/2022   HGB 12.1 12/18/2022   HCT 36.1 12/18/2022   PLT 212.0 12/18/2022   GLUCOSE 102 (H) 04/19/2023   CHOL 163 04/19/2023   TRIG 138.0 04/19/2023   HDL 44.60 04/19/2023   LDLCALC 91 04/19/2023   ALT 9 04/19/2023   AST 16 04/19/2023   NA 136 04/19/2023   K 4.2 04/19/2023   CL 102 04/19/2023   CREATININE 1.04 04/19/2023   BUN 31 (H) 04/19/2023   CO2 25 04/19/2023   TSH 1.05 08/28/2022   INR 1.0 06/07/2020   HGBA1C 5.6 04/19/2023    US Abdomen  Complete  Result Date: 03/25/2023 CLINICAL DATA:  Autoimmune hepatitis EXAM: ABDOMEN  ULTRASOUND COMPLETE COMPARISON:  Ultrasound abdomen 03/14/2022 FINDINGS: Gallbladder: Surgically absent Common bile duct: Diameter: 7.5 mm Liver: No focal lesion identified. Within normal limits in parenchymal echogenicity. Portal vein is patent on color Doppler imaging with normal direction of blood flow towards the liver. IVC: No abnormality visualized. Pancreas: Visualized portion unremarkable. Spleen: Size and appearance within normal limits. Right Kidney: Length: 9.3 cm. Normal renal cortical thickness and echogenicity. There is a 1.9 x 1.5 x 1.7 cm cyst within the superior pole. Left Kidney: Length: 10.1 cm. Normal renal cortical thickness and echogenicity. Mild pelviectasis. Abdominal aorta: No aneurysm visualized. Other findings: None. IMPRESSION: 1. No acute process. 2. Mild left pelviectasis. Electronically Signed   By: Annia Belt M.D.   On: 03/25/2023 12:45       Assessment & Plan:  Hyperglycemia Assessment & Plan: Low carb diet and exercise.  Follow met b and a1c.   Orders: -     Hemoglobin A1c  Primary hypertension Assessment & Plan: On benazepril 20mg  bid.  On sotalol 40mg  q day.   Blood pressure doing well.  Continue current medications.  Follow metabolic panel.   Orders: -     Basic metabolic panel  Screening cholesterol level -     Lipid panel -     Hepatic function panel  Health care maintenance Assessment & Plan: Physical today 04/19/23.   Mammogram 07/30/22- birads I.  Followed by GI.    B12 deficiency Assessment & Plan: Continue b12 injections.   Orders: -     Cyanocobalamin  Abnormal liver function tests Assessment & Plan: Followed by GI.  Stable.  Follow liver function tests. Recent abdominal ultrasound ok.    Anemia, unspecified type Assessment & Plan: Follow cbc.    Autoimmune hepatitis Assessment & Plan: Followed by GI.  Follow liver function tests. Recent abdominal ultrasound ok.    Barrett's esophagus with esophagitis Assessment &  Plan: Upper symptoms controlled.  Continues protonix.     Stage 3a chronic kidney disease Assessment & Plan: Avoid antiinflammatories.  Stay hydrated.  Follow metabolic panel.  Check metabolic panel.     Hypothyroidism, unspecified type Assessment & Plan: On thyroid replacement.  Follow tsh.    MGUS (monoclonal gammopathy of unknown significance) Assessment & Plan: Was worked up by hematology.  Last check - no m spike.  Felt no further w/up warranted.     PSVT (paroxysmal supraventricular tachycardia) Assessment & Plan: Continues on sotalol.  Stable.     Sleep difficulties Assessment & Plan: Requires ambien to sleep.  Follow.    Renal cyst Assessment & Plan: Small renal cyst noted on abdominal ultrasound.  Discussed.    Other orders -     Zolpidem Tartrate ER; Take 1 tablet (6.25 mg total) by mouth at bedtime as needed. for sleep  Dispense: 30 tablet; Refill: 2     Dale Helen, MD

## 2023-04-20 ENCOUNTER — Encounter: Payer: Self-pay | Admitting: Internal Medicine

## 2023-04-20 DIAGNOSIS — G479 Sleep disorder, unspecified: Secondary | ICD-10-CM | POA: Insufficient documentation

## 2023-04-20 DIAGNOSIS — N281 Cyst of kidney, acquired: Secondary | ICD-10-CM | POA: Insufficient documentation

## 2023-04-20 MED ORDER — ZOLPIDEM TARTRATE ER 6.25 MG PO TBCR: 6.2500 mg | EXTENDED_RELEASE_TABLET | Freq: Every evening | ORAL | 2 refills | Status: DC | PRN

## 2023-04-20 NOTE — Assessment & Plan Note (Signed)
Followed by GI.  Follow liver function tests. Recent abdominal ultrasound ok.

## 2023-04-20 NOTE — Assessment & Plan Note (Signed)
Follow cbc.  

## 2023-04-20 NOTE — Assessment & Plan Note (Signed)
Upper symptoms controlled.  Continues protonix.

## 2023-04-20 NOTE — Assessment & Plan Note (Signed)
Continues on sotalol.  Stable.   

## 2023-04-20 NOTE — Assessment & Plan Note (Signed)
Avoid antiinflammatories.  Stay hydrated.  Follow metabolic panel.  Check metabolic panel.   

## 2023-04-20 NOTE — Assessment & Plan Note (Signed)
Low carb diet and exercise.  Follow met b and a1c.   

## 2023-04-20 NOTE — Assessment & Plan Note (Signed)
Requires ambien to sleep.  Follow.

## 2023-04-20 NOTE — Assessment & Plan Note (Signed)
On thyroid replacement.  Follow tsh.  

## 2023-04-20 NOTE — Assessment & Plan Note (Addendum)
On benazepril  bid.  On sotalol  q day.   Blood pressure doing well.  Continue current medications.  Follow metabolic panel.

## 2023-04-20 NOTE — Assessment & Plan Note (Signed)
Followed by GI.  Stable.  Follow liver function tests. Recent abdominal ultrasound ok.

## 2023-04-20 NOTE — Assessment & Plan Note (Signed)
Continue b12 injections.  

## 2023-04-20 NOTE — Assessment & Plan Note (Signed)
Was worked up by hematology.  Last check - no m spike.  Felt no further w/up warranted.   

## 2023-04-20 NOTE — Assessment & Plan Note (Signed)
Small renal cyst noted on abdominal ultrasound.  Discussed.

## 2023-04-22 ENCOUNTER — Telehealth: Payer: Self-pay

## 2023-04-22 DIAGNOSIS — R7989 Other specified abnormal findings of blood chemistry: Secondary | ICD-10-CM

## 2023-04-22 NOTE — Telephone Encounter (Signed)
LMTCB. Ok to give results. Lab order placed

## 2023-04-22 NOTE — Telephone Encounter (Signed)
-----   Message from Dale Brantley, MD sent at 04/20/2023  8:25 PM EDT ----- Notify - kidney function decreased some from last check, but overall relatively stable form checks prior.  Continue to avoid antiinflammatory medications.  We will follow.  Recheck met b in 4 weeks.  Bilirubin slightly increased.  This can be a normal variant.  Recheck liver panel in 4 weeks.  Overall sugar control ok.  (Improved). Cholesterol levels - stable.

## 2023-04-26 ENCOUNTER — Encounter: Payer: Self-pay | Admitting: *Deleted

## 2023-04-29 NOTE — Telephone Encounter (Signed)
I called and ask for Nicki [place] and the lady on the phone gave me the instructions that I needed to have lab work done again in 30 days and she set up and appointment for May 20th at  11:15.   If I need to do something else please let  me know. Thank you,   Ardyth Man

## 2023-04-29 NOTE — Telephone Encounter (Signed)
Pt returned Latoya CMA call. Pt was already aware of her lab results.

## 2023-04-29 NOTE — Telephone Encounter (Signed)
Noted  

## 2023-05-08 ENCOUNTER — Telehealth: Payer: Self-pay

## 2023-05-08 NOTE — Telephone Encounter (Signed)
Pt returned call

## 2023-05-08 NOTE — Telephone Encounter (Signed)
LM for patient to follow up with her about her [place]

## 2023-05-08 NOTE — Telephone Encounter (Signed)
Received notification from patient that [place] requires PA. Please advise

## 2023-05-09 ENCOUNTER — Telehealth: Payer: Self-pay

## 2023-05-09 ENCOUNTER — Other Ambulatory Visit (HOSPITAL_COMMUNITY): Payer: Self-pay

## 2023-05-09 NOTE — Telephone Encounter (Signed)
PA has been submitted and documented in separate encounter

## 2023-05-09 NOTE — Telephone Encounter (Signed)
Will notify patient

## 2023-05-09 NOTE — Telephone Encounter (Signed)
See other phone note

## 2023-05-09 NOTE — Telephone Encounter (Signed)
Pharmacy Patient Advocate Encounter  Prior Authorization for Zolpidem Tartrate ER 6.25mg  has been approved by [facility] Medicare (ins).    PA # [id number] Effective dates: 05/09/2023 until further notice

## 2023-05-09 NOTE — Telephone Encounter (Signed)
Pt aware that Remus Loffler has been approved.

## 2023-05-09 NOTE — Telephone Encounter (Signed)
Patient Advocate Encounter   Received notification from [place] that prior authorization for Zolpidem Tartrate ER 6.25mg  is required.   PA submitted on 05/09/2023 [facility] Status is pending

## 2023-05-20 ENCOUNTER — Ambulatory Visit (INDEPENDENT_AMBULATORY_CARE_PROVIDER_SITE_OTHER): Payer: Medicare Other

## 2023-05-20 DIAGNOSIS — R7989 Other specified abnormal findings of blood chemistry: Secondary | ICD-10-CM | POA: Diagnosis not present

## 2023-05-20 DIAGNOSIS — E538 Deficiency of other specified B group vitamins: Secondary | ICD-10-CM | POA: Diagnosis not present

## 2023-05-20 LAB — HEPATIC FUNCTION PANEL
ALT: 10 U/L (ref 0–35)
AST: 15 U/L (ref 0–37)
Albumin: 3.9 g/dL (ref 3.5–5.2)
Alkaline Phosphatase: 43 U/L (ref 39–117)
Bilirubin, Direct: 0.2 mg/dL (ref 0.0–0.3)
Total Bilirubin: 1.2 mg/dL (ref 0.2–1.2)
Total Protein: 7.1 g/dL (ref 6.0–8.3)

## 2023-05-20 MED ORDER — CYANOCOBALAMIN 1000 MCG/ML IJ SOLN
1000.0000 ug | Freq: Once | INTRAMUSCULAR | Status: AC
Start: 2023-05-20 — End: 2023-05-20
  Administered 2023-05-20: 1000 ug via INTRAMUSCULAR

## 2023-05-20 NOTE — Progress Notes (Signed)
Pt presented for their vitamin B12 injection. Pt was identified through two identifiers. Pt tolerated shot well in their left  deltoid.  

## 2023-06-20 ENCOUNTER — Ambulatory Visit (INDEPENDENT_AMBULATORY_CARE_PROVIDER_SITE_OTHER): Payer: Medicare Other | Admitting: *Deleted

## 2023-06-20 DIAGNOSIS — E538 Deficiency of other specified B group vitamins: Secondary | ICD-10-CM | POA: Diagnosis not present

## 2023-06-20 MED ORDER — CYANOCOBALAMIN 1000 MCG/ML IJ SOLN
1000.0000 ug | Freq: Once | INTRAMUSCULAR | Status: AC
Start: 2023-06-20 — End: 2023-06-20
  Administered 2023-06-20: 1000 ug via INTRAMUSCULAR

## 2023-06-20 NOTE — Progress Notes (Signed)
Pt received B12 injection in right deltoid muscle. Pt tolerated it well with no complaints or concerns.  

## 2023-06-21 ENCOUNTER — Ambulatory Visit: Payer: Medicare Other

## 2023-07-16 ENCOUNTER — Ambulatory Visit: Payer: Medicare Other | Admitting: *Deleted

## 2023-07-16 VITALS — Ht 61.0 in | Wt 139.0 lb

## 2023-07-16 DIAGNOSIS — Z0000 Encounter for general adult medical examination without abnormal findings: Secondary | ICD-10-CM

## 2023-07-16 DIAGNOSIS — Z1231 Encounter for screening mammogram for malignant neoplasm of breast: Secondary | ICD-10-CM

## 2023-07-16 NOTE — Progress Notes (Signed)
Subjective:   Lori [name] is a 89 y.o. female who presents for Medicare Annual (Subsequent) preventive examination.  Visit Complete: Virtual  I connected with  Veronda Prude on 07/16/23 by a audio enabled telemedicine application and verified that I am speaking with the correct person using two identifiers.  Patient Location: Home  Provider Location: Office/Clinic  I discussed the limitations of evaluation and management by telemedicine. The patient expressed understanding and agreed to proceed.  Review of Systems     Cardiac Risk Factors include: advanced age (>59men, >33 women);hypertension;Other (see comment), Risk factor comments: SVT     Objective:    Today's Vitals   07/16/23 0903  Weight: 139 lb (63 kg)  Height: 5\' 1"  (1.549 m)   Body mass index is 26.26 kg/m.     07/16/2023    9:20 AM 10/04/2022    4:40 PM 06/19/2022   11:14 AM 10/07/2021    6:20 PM 10/05/2021    7:47 PM 09/13/2021    7:14 AM 06/16/2021   10:11 AM  Advanced Directives  Does Patient Have a Medical Advance Directive? No No No No No Yes Yes  Type of Careers adviser;Living will Healthcare Power of Attorney  Does patient want to make changes to medical advance directive?      No - Guardian declined No - Patient declined  Copy of Healthcare Power of Attorney in Chart?      No - copy requested No - copy requested  Would patient like information on creating a medical advance directive?  No - Patient declined No - Patient declined No - Patient declined No - Patient declined      Current Medications (verified) Outpatient Encounter Medications as of 07/16/2023  Medication Sig   aspirin 81 MG tablet Take 81 mg by mouth daily.   benazepril (LOTENSIN) 20 MG tablet Take 1 tablet by mouth 2 (two) times daily.   calcitonin, salmon, (MIACALCIN/FORTICAL) 200 UNIT/ACT nasal spray ONE SPRAY ALTERNATING NOSTRILS EVERY OTHER DAY   Calcium-Phosphorus-Vitamin D  (CITRACAL +D3 PO) Take 1 tablet by mouth daily.   chlorzoxazone (PARAFON) 500 MG tablet TAKE 1 TABLET BY MOUTH EVERY DAY AT BEDTIME AS NEEDED. DO NOT TAKE WITH AMBIEN.   Cyanocobalamin (B-12) 1000 MCG/ML KIT Inject 1,000 mcg as directed every 30 (thirty) days.    GLUCOSAMINE-CHONDROITIN PO Take 1 tablet by mouth 2 (two) times daily.    levothyroxine (SYNTHROID) 88 MCG tablet Take 1 tablet (88 mcg total) by mouth daily.   Magnesium Oxide 400 (240 Mg) MG TABS Take 1 tablet (400 mg total) by mouth 2 (two) times daily.   Mesalamine (ASACOL) 400 MG CPDR DR capsule Take 400 mg by mouth 2 (two) times daily.    pantoprazole (PROTONIX) 40 MG tablet Take 40 mg by mouth daily.   sotalol (BETAPACE) 80 MG tablet Take 0.5 mg by mouth daily.   Wheat Dextrin (BENEFIBER DRINK MIX) PACK Take 1 each by mouth daily.    zolpidem (AMBIEN CR) 6.25 MG CR tablet Take 1 tablet (6.25 mg total) by mouth at bedtime as needed. for sleep   No facility-administered encounter medications on file as of 07/16/2023.    Allergies (verified) Penicillins, Daypro [oxaprozin], and Lodine [etodolac]   History: Past Medical History:  Diagnosis Date   Anemia    iron deficient   Arthritis    Autoimmune hepatitis (HCC) 05/10/2016   Hep C   B12 deficiency  Barrett esophagus 01/2016   Bradycardia    Chronic kidney disease    stage III   Diverticulosis    GERD (gastroesophageal reflux disease)    Hypertension    Hypothyroidism    Ulcer disease    Past Surgical History:  Procedure Laterality Date   CATARACT EXTRACTION W/PHACO Left 08/30/2021   Procedure: CATARACT EXTRACTION PHACO AND INTRAOCULAR LENS PLACEMENT (IOC) LEFT 8.21 01:00.3;  Surgeon: Lockie Mola, MD;  Location: [facility];  Service: Ophthalmology;  Laterality: Left;   CATARACT EXTRACTION W/PHACO Right 09/13/2021   Procedure: CATARACT EXTRACTION PHACO AND INTRAOCULAR LENS PLACEMENT (IOC) RIGHT;  Surgeon: Lockie Mola, MD;  Location: [facility];  Service: Ophthalmology;  Laterality: Right;  8.11 00:59.8   CHOLECYSTECTOMY N/A 06/25/2018   Procedure: LAPAROSCOPIC CHOLECYSTECTOMY WITH INTRAOPERATIVE CHOLANGIOGRAM;  Surgeon: Earline Mayotte, MD;  Location: [facility] ORS;  Service: General;  Laterality: N/A;   ESOPHAGOGASTRODUODENOSCOPY (EGD) WITH PROPOFOL N/A 01/30/2016   Procedure: ESOPHAGOGASTRODUODENOSCOPY (EGD) WITH PROPOFOL;  Surgeon: Christena Deem, MD;  Location: [facility];  Service: Endoscopy;  Laterality: N/A;   ESOPHAGOGASTRODUODENOSCOPY (EGD) WITH PROPOFOL N/A 01/14/2018   Procedure: ESOPHAGOGASTRODUODENOSCOPY (EGD) WITH PROPOFOL;  Surgeon: Christena Deem, MD;  Location: [facility] END[facility];  Service: Endoscopy;  Laterality: N/A;   ESOPHAGOGASTRODUODENOSCOPY (EGD) WITH PROPOFOL N/A 05/20/2018   Procedure: ESOPHAGOGASTRODUODENOSCOPY (EGD) WITH PROPOFOL;  Surgeon: Christena Deem, MD;  Location: [facility] ENDOSCOPY;  Service: Endoscopy;  Laterality: N/A;   ESOPHAGOGASTRODUODENOSCOPY (EGD) WITH PROPOFOL N/A 01/13/2019   Procedure: ESOPHAGOGASTRODUODENOSCOPY (EGD) WITH PROPOFOL;  Surgeon: Christena Deem, MD;  Location: [facility] ENDOSCOPY;  Service: Endoscopy;  Laterality: N/A;   HERNIA REPAIR     lap hiatal hernia repair  07/08/08   LEFT OOPHORECTOMY  1993   benign ovarian cyst   TUBAL LIGATION  1981   Family History  Problem Relation Age of Onset   Stroke Mother    Breast cancer Neg Hx    Colon cancer Neg Hx    Social History   Socioeconomic History   Marital status: Widowed    Spouse name: Not on file   Number of children: 0   Years of education: Not on file   Highest education level: Associate degree: occupational, Scientist, product/process development, or vocational program  Occupational History   Not on file  Tobacco Use   Smoking status: Never   Smokeless tobacco: Never  Vaping Use   Vaping status: Never Used  Substance and Sexual Activity   Alcohol use: No    Alcohol/week: 0.0 standard drinks of alcohol   Drug use: No    Sexual activity: Never  Other Topics Concern   Not on file  Social History Narrative   Widowed   Social Determinants of Health   Financial Resource Strain: Low Risk  (07/16/2023)   Overall Financial Resource Strain (CARDIA)    Difficulty of Paying Living Expenses: Not hard at all  Food Insecurity: No Food Insecurity (07/16/2023)   Hunger Vital Sign    Worried About Running Out of Food in the Last Year: Never true    Ran Out of Food in the Last Year: Never true  Transportation Needs: No Transportation Needs (07/16/2023)   PRAPARE - Administrator, Civil Service (Medical): No    Lack of Transportation (Non-Medical): No  Physical Activity: Sufficiently Active (07/16/2023)   Exercise Vital Sign    Days of Exercise per Week: 4 days    Minutes of Exercise per Session: 60 min  Stress: No Stress Concern  Present (07/16/2023)   [employer] of Occupational Health - Occupational Stress Questionnaire    Feeling of Stress : Not at all  Social Connections: Moderately Isolated (07/16/2023)   Social Connection and Isolation Panel [NHANES]    Frequency of Communication with Friends and Family: More than three times a week    Frequency of Social Gatherings with Friends and Family: More than three times a week    Attends Religious Services: More than 4 times per year    Active Member of [employer] or Organizations: No    Attends Banker Meetings: Never    Marital Status: Widowed    Tobacco Counseling Counseling given: Not Answered   Clinical Intake:  Pre-visit preparation completed: Yes  Pain : No/denies pain     BMI - recorded: 26.26 Nutritional Status: BMI 25 -29 Overweight Nutritional Risks: None, Nausea/ vomitting/ diarrhea (had Sunday has had stomach problems for years) Diabetes: No  How often do you need to have someone help you when you read instructions, pamphlets, or other written materials from your doctor or pharmacy?: 1 - Never  Interpreter Needed?:  No  Information entered by :: R. Zeriyah Wain LPN   Activities of Daily Living    07/16/2023    9:07 AM 07/14/2023    1:30 PM  In your present state of health, do you have any difficulty performing the following activities:  Hearing? 1 0  Comment wears aids   Vision? 0 0  Difficulty concentrating or making decisions? 0 0  Walking or climbing stairs? 1 1  Comment a little difficulty with steps   Dressing or bathing? 0 0  Doing errands, shopping? 0 0  Preparing Food and eating ? N N  Using the Toilet? N N  In the past six months, have you accidently leaked urine? N Y  Do you have problems with loss of bowel control? Y N  Comment occassionally because diarrhea   Managing your Medications? N N  Managing your Finances? N N  Housekeeping or managing your Housekeeping? N N    Patient Care Team: Dale Mannington, MD as PCP - General (Internal Medicine)  Indicate any recent Medical Services you may have received from other than [facility] providers in the past year (date may be approximate).     Assessment:   This is a routine wellness examination for Alsey.  Hearing/Vision screen Hearing Screening - Comments:: aids Vision Screening - Comments:: No glasses  Dietary issues and exercise activities discussed:     Goals Addressed             This Visit's Progress    Patient Stated       None       Depression Screen    07/16/2023    9:15 AM 04/19/2023   10:26 AM 12/18/2022    9:02 AM 08/28/2022    2:08 PM 06/19/2022   11:11 AM 04/26/2022    9:13 AM 06/16/2021   10:10 AM  PHQ 2/9 Scores  PHQ - 2 Score 0 0 0 0 0 0 0  PHQ- 9 Score 1 4         Fall Risk    07/16/2023    9:23 AM 07/14/2023    1:30 PM 04/19/2023    9:56 AM 12/18/2022    9:02 AM 08/28/2022    2:07 PM  Fall Risk   Falls in the past year? 0 0 0 1 0  Number falls in past yr: 0  0 0 0  Injury with Fall? 0  0 1 0  Risk for fall due to : History of fall(s);Impaired balance/gait  No Fall Risks History of fall(s);Impaired  balance/gait No Fall Risks  Follow up Falls prevention discussed  Falls evaluation completed Falls evaluation completed Falls evaluation completed    MEDICARE RISK AT HOME:  Medicare Risk at Home - 07/16/23 0924     Any stairs in or around the home? Yes    If so, are there any without handrails? Yes    Home free of loose throw rugs in walkways, pet beds, electrical cords, etc? Yes    Adequate lighting in your home to reduce risk of falls? Yes    Life alert? No    Use of a cane, walker or w/c? Yes   occassionally   Grab bars in the bathroom? Yes    Shower chair or bench in shower? Yes    Elevated toilet seat or a handicapped toilet? Yes              Cognitive Function:    03/26/2017   10:49 AM 03/26/2016    1:25 PM  MMSE - Mini Mental State Exam  Orientation to time 5 5  Orientation to Place 5 5  Registration 3 3  Attention/ Calculation 5 5  Recall 3 3  Language- name 2 objects 2 2  Language- repeat 1 1  Language- follow 3 step command 3 3  Language- read & follow direction 1 1  Write a sentence 1 1  Copy design 1 1  Total score 30 30        07/16/2023    9:20 AM 06/16/2021   10:30 AM 06/15/2020    9:50 AM 06/01/2019   10:45 AM 03/27/2018   10:59 AM  6CIT Screen  What Year? 0 points 0 points 0 points 0 points 0 points  What month? 0 points 0 points 0 points 0 points 0 points  What time? 0 points 0 points 0 points 0 points 0 points  Count back from 20 0 points  0 points 0 points 0 points  Months in reverse 0 points 0 points 0 points 0 points 0 points  Repeat phrase 0 points  2 points 0 points 0 points  Total Score 0 points  2 points 0 points 0 points    Immunizations Immunization History  Administered Date(s) Administered   Fluad Quad(high Dose 65+) 09/08/2019, 09/20/2020, 09/22/2021, 10/12/2022   Influenza, High Dose Seasonal PF 10/01/2017, 09/12/2018   Influenza,inj,Quad PF,6+ Mos 08/31/2014, 09/07/2015, 08/21/2016   Influenza-Unspecified 09/11/2012,  10/14/2013, 08/31/2014, 09/07/2015, 08/21/2016   PFIZER(Purple Top)SARS-COV-2 Vaccination 01/25/2020, 02/15/2020, 12/27/2020   Pfizer Covid-19 Vaccine Bivalent Booster 5y-11y 04/09/2022   Pneumococcal Conjugate-13 03/08/2014, 03/02/2015   Pneumococcal Polysaccharide-23 07/22/2008   Zoster Recombinant(Shingrix) 05/14/2018, 07/15/2018   Zoster, Live 12/31/2008    TDAP status: Due, Education has been provided regarding the importance of this vaccine. Advised may receive this vaccine at local pharmacy or Health Dept. Aware to provide a copy of the vaccination record if obtained from local pharmacy or Health Dept. Verbalized acceptance and understanding.  Flu Vaccine status: Up to date  Pneumococcal vaccine status: Up to date  Covid-19 vaccine status: Completed vaccines  Qualifies for Shingles Vaccine? Yes   Zostavax completed Yes   Shingrix Completed?: Yes  Screening Tests Health Maintenance  Topic Date Due   COVID-19 Vaccine (5 - 2023-24 season) 08/31/2022   Medicare Annual Wellness (AWV)  06/20/2023   MAMMOGRAM  07/31/2023  INFLUENZA VACCINE  08/01/2023   Pneumonia Vaccine 49+ Years old  Completed   DEXA SCAN  Completed   Zoster Vaccines- Shingrix  Completed   HPV VACCINES  Aged Out   DTaP/Tdap/Td  Discontinued    Health Maintenance  Health Maintenance Due  Topic Date Due   COVID-19 Vaccine (5 - 2023-24 season) 08/31/2022   Medicare Annual Wellness (AWV)  06/20/2023    Colorectal cancer screening: No longer required.   Mammogram status: Completed 07/30/22. Repeat every year Order placed for Mammogram  Bone Density status: Completed 7/22. Results reflect: Bone density results: OSTEOPENIA. Repeat every 2 years.  Lung Cancer Screening: (Low Dose CT Chest recommended if Age 21-80 years, 20 pack-year currently smoking OR have quit w/in 15years.) does not qualify.     Additional Screening:  Hepatitis C Screening: does not qualify; Completed NA age  Vision Screening:  Recommended annual ophthalmology exams for early detection of glaucoma and other disorders of the eye. Is the patient up to date with their annual eye exam?  Yes  Who is the provider or what is the name of the office in which the patient attends annual eye exams? [facility] If pt is not established with a provider, would they like to be referred to a provider to establish care? No .   Dental Screening: Recommended annual dental exams for proper oral hygiene  D  Community Resource Referral / Chronic Care Management: CRR required this visit?  No   CCM required this visit?  No     Plan:     I have personally reviewed and noted the following in the patient's chart:   Medical and social history Use of alcohol, tobacco or illicit drugs  Current medications and supplements including opioid prescriptions. Patient is not currently taking opioid prescriptions. Functional ability and status Nutritional status Physical activity Advanced directives List of other physicians Hospitalizations, surgeries, and ER visits in previous 12 months Vitals Screenings to include cognitive, depression, and falls Referrals and appointments  In addition, I have reviewed and discussed with patient certain preventive protocols, quality metrics, and best practice recommendations. A written personalized care plan for preventive services as well as general preventive health recommendations were provided to patient.     Sydell Axon, LPN   1/61/0960   After Visit Summary: (MyChart) Due to this being a telephonic visit, the after visit summary with patients personalized plan was offered to patient via MyChart   Nurse Notes: None

## 2023-07-16 NOTE — Patient Instructions (Signed)
Lori [name] , Thank you for taking time to come for your Medicare Wellness Visit. I appreciate your ongoing commitment to your health goals. Please review the following plan we discussed and let me know if I can assist you in the future.   These are the goals we discussed:  Goals      Maintain Healthy Lifestyle     Walk for exercise. Stay hydrated. Healthy foods.     Patient Stated     None        This is a list of the screening recommended for you and due dates:  Health Maintenance  Topic Date Due   COVID-19 Vaccine (5 - 2023-24 season) 08/31/2022   Mammogram  07/31/2023   Flu Shot  08/01/2023   Medicare Annual Wellness Visit  07/15/2024   Pneumonia Vaccine  Completed   DEXA scan (bone density measurement)  Completed   Zoster (Shingles) Vaccine  Completed   HPV Vaccine  Aged Out   DTaP/Tdap/Td vaccine  Discontinued    Advanced directives: Will work on this  Conditions/risks identified: None  Next appointment: Follow up in one year for your annual wellness visit 07/17/24 @ 10:15   Preventive Care 65 Years and Older, Female Preventive care refers to lifestyle choices and visits with your health care provider that can promote health and wellness. What does preventive care include? A yearly physical exam. This is also called an annual well check. Dental exams once or twice a year. Routine eye exams. Ask your health care provider how often you should have your eyes checked. Personal lifestyle choices, including: Daily care of your teeth and gums. Regular physical activity. Eating a healthy diet. Avoiding tobacco and drug use. Limiting alcohol use. Practicing safe sex. Taking low-dose aspirin every day. Taking vitamin and mineral supplements as recommended by your health care provider. What happens during an annual well check? The services and screenings done by your health care provider during your annual well check will depend on your age, overall health, lifestyle  risk factors, and family history of disease. Counseling  Your health care provider may ask you questions about your: Alcohol use. Tobacco use. Drug use. Emotional well-being. Home and relationship well-being. Sexual activity. Eating habits. History of falls. Memory and ability to understand (cognition). Work and work Astronomer. Reproductive health. Screening  You may have the following tests or measurements: Height, weight, and BMI. Blood pressure. Lipid and cholesterol levels. These may be checked every 5 years, or more frequently if you are over 79 years old. Skin check. Lung cancer screening. You may have this screening every year starting at age 65 if you have a 30-pack-year history of smoking and currently smoke or have quit within the past 15 years. Fecal occult blood test (FOBT) of the stool. You may have this test every year starting at age 53. Flexible sigmoidoscopy or colonoscopy. You may have a sigmoidoscopy every 5 years or a colonoscopy every 10 years starting at age 30. Hepatitis C blood test. Hepatitis B blood test. Sexually transmitted disease (STD) testing. Diabetes screening. This is done by checking your blood sugar (glucose) after you have not eaten for a while (fasting). You may have this done every 1-3 years. Bone density scan. This is done to screen for osteoporosis. You may have this done starting at age 50. Mammogram. This may be done every 1-2 years. Talk to your health care provider about how often you should have regular mammograms. Talk with your health care provider about your  test results, treatment options, and if necessary, the need for more tests. Vaccines  Your health care provider may recommend certain vaccines, such as: Influenza vaccine. This is recommended every year. Tetanus, diphtheria, and acellular pertussis (Tdap, Td) vaccine. You may need a Td booster every 10 years. Zoster vaccine. You may need this after age 86. Pneumococcal  13-valent conjugate (PCV13) vaccine. One dose is recommended after age 16. Pneumococcal polysaccharide (PPSV23) vaccine. One dose is recommended after age 82. Talk to your health care provider about which screenings and vaccines you need and how often you need them. This information is not intended to replace advice given to you by your health care provider. Make sure you discuss any questions you have with your health care provider. Document Released: 01/13/2016 Document Revised: 09/05/2016 Document Reviewed: 10/18/2015 Elsevier Interactive Patient Education  2017 [employer].  Fall Prevention in the Home Falls can cause injuries. They can happen to people of all ages. There are many things you can do to make your home safe and to help prevent falls. What can I do on the outside of my home? Regularly fix the edges of walkways and driveways and fix any cracks. Remove anything that might make you trip as you walk through a door, such as a raised step or threshold. Trim any bushes or trees on the path to your home. Use bright outdoor lighting. Clear any walking paths of anything that might make someone trip, such as rocks or tools. Regularly check to see if handrails are loose or broken. Make sure that both sides of any steps have handrails. Any raised decks and porches should have guardrails on the edges. Have any leaves, snow, or ice cleared regularly. Use sand or salt on walking paths during winter. Clean up any spills in your garage right away. This includes oil or grease spills. What can I do in the bathroom? Use night lights. Install grab bars by the toilet and in the tub and shower. Do not use towel bars as grab bars. Use non-skid mats or decals in the tub or shower. If you need to sit down in the shower, use a plastic, non-slip stool. Keep the floor dry. Clean up any water that spills on the floor as soon as it happens. Remove soap buildup in the tub or shower regularly. Attach  bath mats securely with double-sided non-slip rug tape. Do not have throw rugs and other things on the floor that can make you trip. What can I do in the bedroom? Use night lights. Make sure that you have a light by your bed that is easy to reach. Do not use any sheets or blankets that are too big for your bed. They should not hang down onto the floor. Have a firm chair that has side arms. You can use this for support while you get dressed. Do not have throw rugs and other things on the floor that can make you trip. What can I do in the kitchen? Clean up any spills right away. Avoid walking on wet floors. Keep items that you use a lot in easy-to-reach places. If you need to reach something above you, use a strong step stool that has a grab bar. Keep electrical cords out of the way. Do not use floor polish or wax that makes floors slippery. If you must use wax, use non-skid floor wax. Do not have throw rugs and other things on the floor that can make you trip. What can I do with my  stairs? Do not leave any items on the stairs. Make sure that there are handrails on both sides of the stairs and use them. Fix handrails that are broken or loose. Make sure that handrails are as long as the stairways. Check any carpeting to make sure that it is firmly attached to the stairs. Fix any carpet that is loose or worn. Avoid having throw rugs at the top or bottom of the stairs. If you do have throw rugs, attach them to the floor with carpet tape. Make sure that you have a light switch at the top of the stairs and the bottom of the stairs. If you do not have them, ask someone to add them for you. What else can I do to help prevent falls? Wear shoes that: Do not have high heels. Have rubber bottoms. Are comfortable and fit you well. Are closed at the toe. Do not wear sandals. If you use a stepladder: Make sure that it is fully opened. Do not climb a closed stepladder. Make sure that both sides of the  stepladder are locked into place. Ask someone to hold it for you, if possible. Clearly mark and make sure that you can see: Any grab bars or handrails. First and last steps. Where the edge of each step is. Use tools that help you move around (mobility aids) if they are needed. These include: Canes. Walkers. Scooters. Crutches. Turn on the lights when you go into a dark area. Replace any light bulbs as soon as they burn out. Set up your furniture so you have a clear path. Avoid moving your furniture around. If any of your floors are uneven, fix them. If there are any pets around you, be aware of where they are. Review your medicines with your doctor. Some medicines can make you feel dizzy. This can increase your chance of falling. Ask your doctor what other things that you can do to help prevent falls. This information is not intended to replace advice given to you by your health care provider. Make sure you discuss any questions you have with your health care provider. Document Released: 10/13/2009 Document Revised: 05/24/2016 Document Reviewed: 01/21/2015 Elsevier Interactive Patient Education  2017 [employer].  Per patient no change in vitals since last visit, unable to obtain new vitals due to telehealth visit

## 2023-07-22 ENCOUNTER — Ambulatory Visit (INDEPENDENT_AMBULATORY_CARE_PROVIDER_SITE_OTHER): Payer: Medicare Other

## 2023-07-22 DIAGNOSIS — E538 Deficiency of other specified B group vitamins: Secondary | ICD-10-CM | POA: Diagnosis not present

## 2023-07-22 MED ORDER — CYANOCOBALAMIN 1000 MCG/ML IJ SOLN
1000.0000 ug | Freq: Once | INTRAMUSCULAR | Status: AC
Start: 2023-07-22 — End: 2023-07-22
  Administered 2023-07-22: 1000 ug via INTRAMUSCULAR

## 2023-07-22 NOTE — Progress Notes (Signed)
After obtaining consent, and per orders of Dr. Lorin Picket, injection of B12 given in left deltoid by Kristie Cowman. Patient tolerated injection well.

## 2023-07-31 ENCOUNTER — Encounter (INDEPENDENT_AMBULATORY_CARE_PROVIDER_SITE_OTHER): Payer: Self-pay

## 2023-08-07 ENCOUNTER — Telehealth: Payer: Self-pay | Admitting: Internal Medicine

## 2023-08-07 DIAGNOSIS — R739 Hyperglycemia, unspecified: Secondary | ICD-10-CM

## 2023-08-07 DIAGNOSIS — K5792 Diverticulitis of intestine, part unspecified, without perforation or abscess without bleeding: Secondary | ICD-10-CM | POA: Diagnosis not present

## 2023-08-07 DIAGNOSIS — I10 Essential (primary) hypertension: Secondary | ICD-10-CM

## 2023-08-07 DIAGNOSIS — Z13220 Encounter for screening for lipoid disorders: Secondary | ICD-10-CM

## 2023-08-07 DIAGNOSIS — R197 Diarrhea, unspecified: Secondary | ICD-10-CM | POA: Diagnosis not present

## 2023-08-07 NOTE — Telephone Encounter (Signed)
Labs ordered.

## 2023-08-07 NOTE — Telephone Encounter (Signed)
Patient need lab orders.

## 2023-08-14 ENCOUNTER — Telehealth: Payer: Self-pay | Admitting: Internal Medicine

## 2023-08-14 DIAGNOSIS — R197 Diarrhea, unspecified: Secondary | ICD-10-CM

## 2023-08-14 NOTE — Telephone Encounter (Signed)
Called patient to follow up because letter that was left was about her having persistent diarrhea since 08/01/23 (see walk in clinic notes.) Advised that you are out of the office and will return tomorrow. She is not having any other acute symptoms. She has been on a BRAT diet since going to the walk in clinic. She is still having liquid brown stools that sometimes look like they have mucous in them. Mainly after she eats. She is using benefiber daily. No abd pain. Has reached out to GI and seeing Christiane on 8/30. Staying hydrated, eating regular meals just bland. She was not sure if there was any lab tests that needed to be added to her lab work tomorrow. Patient has f/u with you 8/27. Advised if symptoms change or if she develops any weakness, abd pain or any new symptoms- she needs to be evaluated ASAP.

## 2023-08-14 NOTE — Telephone Encounter (Signed)
Patient just walked in and she said she has a appointment tomorrow. She just went to see Dr. Shanon Ace. And she wanted Dr Lorin Picket to be aware of everything on her upcoming appointment. The letter is in your colorful folder. Her number is [phone]. If you have any questions.

## 2023-08-15 ENCOUNTER — Other Ambulatory Visit: Payer: Self-pay | Admitting: Internal Medicine

## 2023-08-15 ENCOUNTER — Encounter: Payer: Self-pay | Admitting: Internal Medicine

## 2023-08-15 ENCOUNTER — Other Ambulatory Visit
Admission: RE | Admit: 2023-08-15 | Discharge: 2023-08-15 | Disposition: A | Discharge status: Home / Self Care | Payer: Medicare Other | Source: Ambulatory Visit | Attending: Internal Medicine | Admitting: Internal Medicine

## 2023-08-15 ENCOUNTER — Other Ambulatory Visit (INDEPENDENT_AMBULATORY_CARE_PROVIDER_SITE_OTHER): Payer: Medicare Other

## 2023-08-15 DIAGNOSIS — R197 Diarrhea, unspecified: Secondary | ICD-10-CM | POA: Insufficient documentation

## 2023-08-15 DIAGNOSIS — Z13220 Encounter for screening for lipoid disorders: Secondary | ICD-10-CM

## 2023-08-15 DIAGNOSIS — R739 Hyperglycemia, unspecified: Secondary | ICD-10-CM

## 2023-08-15 DIAGNOSIS — I10 Essential (primary) hypertension: Secondary | ICD-10-CM

## 2023-08-15 LAB — GASTROINTESTINAL PANEL BY PCR, STOOL (REPLACES STOOL CULTURE)

## 2023-08-15 LAB — BASIC METABOLIC PANEL
BUN: 15 mg/dL (ref 6–23)
CO2: 22 mEq/L (ref 19–32)
Calcium: 9.2 mg/dL (ref 8.4–10.5)
Chloride: 101 mEq/L (ref 96–112)
Creatinine, Ser: 1.39 mg/dL — ABNORMAL HIGH (ref 0.40–1.20)
GFR: 34.27 mL/min — ABNORMAL LOW (ref >60.00)
Glucose, Bld: 87 mg/dL (ref 70–99)
Potassium: 3.5 mEq/L (ref 3.5–5.1)
Sodium: 133 mEq/L — ABNORMAL LOW (ref 135–145)

## 2023-08-15 LAB — HEPATIC FUNCTION PANEL
ALT: 6 U/L (ref 0–35)
AST: 14 U/L (ref 0–37)
Albumin: 3.9 g/dL (ref 3.5–5.2)
Alkaline Phosphatase: 56 U/L (ref 39–117)
Bilirubin, Direct: 0.3 mg/dL (ref 0.0–0.3)
Total Bilirubin: 1.6 mg/dL — ABNORMAL HIGH (ref 0.2–1.2)
Total Protein: 6.8 g/dL (ref 6.0–8.3)

## 2023-08-15 LAB — CBC WITH DIFFERENTIAL/PLATELET
Basophils Absolute: 0 10*3/uL (ref 0.0–0.1)
Basophils Relative: 0.9 % (ref 0.0–3.0)
Eosinophils Absolute: 0.1 10*3/uL (ref 0.0–0.7)
Eosinophils Relative: 2.3 % (ref 0.0–5.0)
HCT: 37.6 % (ref 36.0–46.0)
Hemoglobin: 12.3 g/dL (ref 12.0–15.0)
Lymphocytes Relative: 20.7 % (ref 12.0–46.0)
Lymphs Abs: 1 10*3/uL (ref 0.7–4.0)
MCHC: 32.8 g/dL (ref 30.0–36.0)
MCV: 88.9 fl (ref 78.0–100.0)
Monocytes Absolute: 0.6 10*3/uL (ref 0.1–1.0)
Monocytes Relative: 13.5 % — ABNORMAL HIGH (ref 3.0–12.0)
Neutro Abs: 2.9 10*3/uL (ref 1.4–7.7)
Neutrophils Relative %: 62.6 % (ref 43.0–77.0)
Platelets: 279 10*3/uL (ref 150.0–400.0)
RBC: 4.23 Mil/uL (ref 3.87–5.11)
RDW: 13.4 % (ref 11.5–15.5)
WBC: 4.6 10*3/uL (ref 4.0–10.5)

## 2023-08-15 LAB — LIPID PANEL
Cholesterol: 138 mg/dL (ref 0–200)
HDL: 37.7 mg/dL — ABNORMAL LOW (ref >39.00)
LDL Cholesterol: 73 mg/dL (ref 0–99)
NonHDL: 100.37
Total CHOL/HDL Ratio: 4
Triglycerides: 137 mg/dL (ref 0.0–149.0)
VLDL: 27.4 mg/dL (ref 0.0–40.0)

## 2023-08-15 LAB — TSH: TSH: 0.66 u[IU]/mL (ref 0.35–5.50)

## 2023-08-15 LAB — HEMOGLOBIN A1C: Hgb A1c MFr Bld: 5.7 % (ref 4.6–6.5)

## 2023-08-15 NOTE — Telephone Encounter (Signed)
Spoke with patient. Stool studies added.

## 2023-08-15 NOTE — Progress Notes (Signed)
Orders placed for cbc

## 2023-08-15 NOTE — Telephone Encounter (Signed)
CBC added.  See if agreeable for stool studies given persistent increased diarrhea per chart review.  Also, see if taking any probiotics or benefiber

## 2023-08-20 ENCOUNTER — Ambulatory Visit: Payer: Medicare Other | Admitting: Internal Medicine

## 2023-08-22 ENCOUNTER — Ambulatory Visit (INDEPENDENT_AMBULATORY_CARE_PROVIDER_SITE_OTHER): Payer: Medicare Other

## 2023-08-22 ENCOUNTER — Other Ambulatory Visit: Payer: Self-pay | Admitting: Internal Medicine

## 2023-08-22 DIAGNOSIS — E538 Deficiency of other specified B group vitamins: Secondary | ICD-10-CM | POA: Diagnosis not present

## 2023-08-22 MED ORDER — CYANOCOBALAMIN 1000 MCG/ML IJ SOLN
1000.0000 ug | Freq: Once | INTRAMUSCULAR | Status: AC
Start: 2023-08-22 — End: 2023-08-22
  Administered 2023-08-22: 1000 ug via INTRAMUSCULAR

## 2023-08-22 NOTE — Progress Notes (Signed)
Opened in error

## 2023-08-22 NOTE — Progress Notes (Signed)
Patient arrived for a B12 injection and it was administered into her right deltoid. Patient tolerated the injection well and did not show any signs of distress or voice any concerns. 

## 2023-08-27 ENCOUNTER — Ambulatory Visit: Payer: Medicare Other | Admitting: Internal Medicine

## 2023-08-27 ENCOUNTER — Encounter: Payer: Self-pay | Admitting: Internal Medicine

## 2023-08-27 VITALS — BP 124/72 | HR 58 | Temp 97.8°F | Ht 61.0 in | Wt 135.2 lb

## 2023-08-27 DIAGNOSIS — R7989 Other specified abnormal findings of blood chemistry: Secondary | ICD-10-CM

## 2023-08-27 DIAGNOSIS — E039 Hypothyroidism, unspecified: Secondary | ICD-10-CM | POA: Diagnosis not present

## 2023-08-27 DIAGNOSIS — I10 Essential (primary) hypertension: Secondary | ICD-10-CM

## 2023-08-27 DIAGNOSIS — K2270 Barrett's esophagus without dysplasia: Secondary | ICD-10-CM

## 2023-08-27 DIAGNOSIS — R197 Diarrhea, unspecified: Secondary | ICD-10-CM

## 2023-08-27 DIAGNOSIS — N1831 Chronic kidney disease, stage 3a: Secondary | ICD-10-CM | POA: Diagnosis not present

## 2023-08-27 DIAGNOSIS — I471 Supraventricular tachycardia, unspecified: Secondary | ICD-10-CM | POA: Diagnosis not present

## 2023-08-27 DIAGNOSIS — K754 Autoimmune hepatitis: Secondary | ICD-10-CM | POA: Diagnosis not present

## 2023-08-27 DIAGNOSIS — R739 Hyperglycemia, unspecified: Secondary | ICD-10-CM

## 2023-08-27 DIAGNOSIS — K209 Esophagitis, unspecified without bleeding: Secondary | ICD-10-CM

## 2023-08-27 NOTE — Progress Notes (Unsigned)
Subjective:    Patient ID: Lori [name], female    DOB: 11-Apr-1934, 89 y.o.   MRN: [id number]  Patient here for  Chief Complaint  Patient presents with   Medical Management of Chronic Issues    HPI Here for follow up - hypertension, bradycardia/SVT, hypothyroidism and anemia. On sotalol and benazepril. Saw cardiology 04/11/23 - stable - no changes. Saw GI 03/12/23 - f/u GERD/Barretts. Upper symptoms controlled on protonix. Has a history of collagenous colitis. Reports that she started having increased diarrhea - this last month.  Was seen acute care - 08/07/23.  Was told may have diverticulitis.  Was prescribed avelox.  Did not take given interaction with cardiac medication.  Reports persistent diarrhea.  Notices after eating. Has been trying to eat BRAT diet.  States at times - looks like just brown water coming out.  No bleeding.  No abdominal pain.  Some days, may have 15 episodes per day.  Taking protonix. Did stop magnesium.  Is not ingesting artificial sweeteners. Reports decreased energy.     Past Medical History:  Diagnosis Date   Anemia    iron deficient   Arthritis    Autoimmune hepatitis (HCC) 05/10/2016   Hep C   B12 deficiency    Barrett esophagus 01/2016   Bradycardia    Chronic kidney disease    stage III   Diverticulosis    GERD (gastroesophageal reflux disease)    Hypertension    Hypothyroidism    Ulcer disease    Past Surgical History:  Procedure Laterality Date   CATARACT EXTRACTION W/PHACO Left 08/30/2021   Procedure: CATARACT EXTRACTION PHACO AND INTRAOCULAR LENS PLACEMENT (IOC) LEFT 8.21 01:00.3;  Surgeon: Lockie Mola, MD;  Location: [facility];  Service: Ophthalmology;  Laterality: Left;   CATARACT EXTRACTION W/PHACO Right 09/13/2021   Procedure: CATARACT EXTRACTION PHACO AND INTRAOCULAR LENS PLACEMENT (IOC) RIGHT;  Surgeon: Lockie Mola, MD;  Location: [facility];  Service: Ophthalmology;  Laterality: Right;   8.11 00:59.8   CHOLECYSTECTOMY N/A 06/25/2018   Procedure: LAPAROSCOPIC CHOLECYSTECTOMY WITH INTRAOPERATIVE CHOLANGIOGRAM;  Surgeon: Earline Mayotte, MD;  Location: [facility];  Service: General;  Laterality: N/A;   ESOPHAGOGASTRODUODENOSCOPY (EGD) WITH PROPOFOL N/A 01/30/2016   Procedure: ESOPHAGOGASTRODUODENOSCOPY (EGD) WITH PROPOFOL;  Surgeon: Christena Deem, MD;  Location: [facility];  Service: Endoscopy;  Laterality: N/A;   ESOPHAGOGASTRODUODENOSCOPY (EGD) WITH PROPOFOL N/A 01/14/2018   Procedure: ESOPHAGOGASTRODUODENOSCOPY (EGD) WITH PROPOFOL;  Surgeon: Christena Deem, MD;  Location: [facility];  Service: Endoscopy;  Laterality: N/A;   ESOPHAGOGASTRODUODENOSCOPY (EGD) WITH PROPOFOL N/A 05/20/2018   Procedure: ESOPHAGOGASTRODUODENOSCOPY (EGD) WITH PROPOFOL;  Surgeon: Christena Deem, MD;  Location: [facility];  Service: Endoscopy;  Laterality: N/A;   ESOPHAGOGASTRODUODENOSCOPY (EGD) WITH PROPOFOL N/A 01/13/2019   Procedure: ESOPHAGOGASTRODUODENOSCOPY (EGD) WITH PROPOFOL;  Surgeon: Christena Deem, MD;  Location: [facility] ENDOSCOPY;  Service: Endoscopy;  Laterality: N/A;   HERNIA REPAIR     lap hiatal hernia repair  07/08/08   LEFT OOPHORECTOMY  1993   benign ovarian cyst   TUBAL LIGATION  1981   Family History  Problem Relation Age of Onset   Stroke Mother    Breast cancer Neg Hx    Colon cancer Neg Hx    Social History   Socioeconomic History   Marital status: Widowed    Spouse name: Not on file   Number of children: 0   Years of education: Not on file   Highest education level: Associate degree: occupational, Scientist, product/process development,  or vocational program  Occupational History   Not on file  Tobacco Use   Smoking status: Never   Smokeless tobacco: Never  Vaping Use   Vaping status: Never Used  Substance and Sexual Activity   Alcohol use: No    Alcohol/week: 0.0 standard drinks of alcohol   Drug use: No   Sexual activity: Never  Other Topics Concern   Not on file   Social History Narrative   Widowed   Social Determinants of Health   Financial Resource Strain: Low Risk  (07/16/2023)   Overall Financial Resource Strain (CARDIA)    Difficulty of Paying Living Expenses: Not hard at all  Food Insecurity: No Food Insecurity (07/16/2023)   Hunger Vital Sign    Worried About Running Out of Food in the Last Year: Never true    Ran Out of Food in the Last Year: Never true  Transportation Needs: No Transportation Needs (07/16/2023)   PRAPARE - Administrator, Civil Service (Medical): No    Lack of Transportation (Non-Medical): No  Physical Activity: Sufficiently Active (07/16/2023)   Exercise Vital Sign    Days of Exercise per Week: 4 days    Minutes of Exercise per Session: 60 min  Stress: No Stress Concern Present (07/16/2023)   [employer] of Occupational Health - Occupational Stress Questionnaire    Feeling of Stress : Not at all  Social Connections: Moderately Isolated (07/16/2023)   Social Connection and Isolation Panel [NHANES]    Frequency of Communication with Friends and Family: More than three times a week    Frequency of Social Gatherings with Friends and Family: More than three times a week    Attends Religious Services: More than 4 times per year    Active Member of [employer] or Organizations: No    Attends Banker Meetings: Never    Marital Status: Widowed     Review of Systems  Constitutional:  Positive for fatigue.       Decreased weight.   HENT:  Negative for congestion and sinus pressure.   Respiratory:  Negative for cough, chest tightness and shortness of breath.   Cardiovascular:  Negative for chest pain, palpitations and leg swelling.  Gastrointestinal:  Positive for diarrhea. Negative for nausea and vomiting.       Denies abdominal pain.   Genitourinary:  Negative for difficulty urinating and dysuria.  Musculoskeletal:  Negative for joint swelling and myalgias.  Skin:  Negative for color change and  rash.  Neurological:  Negative for dizziness and headaches.  Psychiatric/Behavioral:  Negative for agitation and dysphoric mood.        Objective:     BP 124/72   Pulse (!) 58   Temp 97.8 F (36.6 C) (Oral)   Ht 5\' 1"  (1.549 m)   Wt 135 lb 3.2 oz (61.3 kg)   SpO2 96%   BMI 25.55 kg/m  Wt Readings from Last 3 Encounters:  08/27/23 135 lb 3.2 oz (61.3 kg)  07/16/23 139 lb (63 kg)  04/19/23 140 lb 4 oz (63.6 kg)    Physical Exam Constitutional:      General: She is not in acute distress.    Appearance: Normal appearance.  HENT:     Head: Normocephalic and atraumatic.     Nose: Nose normal.     Mouth/Throat:     Pharynx: No oropharyngeal exudate or posterior oropharyngeal erythema.  Neck:     Thyroid: No thyromegaly.  Cardiovascular:  Rate and Rhythm: Normal rate and regular rhythm.  Pulmonary:     Effort: No respiratory distress.     Breath sounds: Normal breath sounds. No wheezing.  Abdominal:     General: Bowel sounds are normal.     Palpations: Abdomen is soft.     Tenderness: There is no abdominal tenderness.  Musculoskeletal:        General: No swelling or tenderness.     Cervical back: Neck supple. No tenderness.  Lymphadenopathy:     Cervical: No cervical adenopathy.  Skin:    Findings: No erythema or rash.  Neurological:     Mental Status: She is alert.  Psychiatric:        Mood and Affect: Mood normal.        Behavior: Behavior normal.      Outpatient Encounter Medications as of 08/27/2023  Medication Sig   aspirin 81 MG tablet Take 81 mg by mouth daily.   benazepril (LOTENSIN) 20 MG tablet Take 1 tablet by mouth 2 (two) times daily.   calcitonin, salmon, (MIACALCIN/FORTICAL) 200 UNIT/ACT nasal spray ONE SPRAY ALTERNATING NOSTRILS EVERY OTHER DAY   Calcium-Phosphorus-Vitamin D (CITRACAL +D3 PO) Take 1 tablet by mouth daily.   chlorzoxazone (PARAFON) 500 MG tablet TAKE 1 TABLET BY MOUTH EVERY DAY AT BEDTIME AS NEEDED. DO NOT TAKE WITH AMBIEN.    Cyanocobalamin (B-12) 1000 MCG/ML KIT Inject 1,000 mcg as directed every 30 (thirty) days.    GLUCOSAMINE-CHONDROITIN PO Take 1 tablet by mouth 2 (two) times daily.    levothyroxine (SYNTHROID) 88 MCG tablet Take 1 tablet (88 mcg total) by mouth daily.   Magnesium Oxide 400 (240 Mg) MG TABS Take 1 tablet (400 mg total) by mouth 2 (two) times daily.   Mesalamine (ASACOL) 400 MG CPDR DR capsule Take 400 mg by mouth 2 (two) times daily.    pantoprazole (PROTONIX) 40 MG tablet Take 40 mg by mouth daily.   sotalol (BETAPACE) 80 MG tablet Take 0.5 mg by mouth daily.   Wheat Dextrin (BENEFIBER DRINK MIX) PACK Take 1 each by mouth daily.    zolpidem (AMBIEN CR) 6.25 MG CR tablet Take 1 tablet (6.25 mg total) by mouth at bedtime as needed. for sleep   No facility-administered encounter medications on file as of 08/27/2023.     Lab Results  Component Value Date   WBC 4.8 08/27/2023   HGB 12.0 08/27/2023   HCT 36.6 08/27/2023   PLT 251.0 08/27/2023   GLUCOSE 101 (H) 08/27/2023   CHOL 138 08/15/2023   TRIG 137.0 08/15/2023   HDL 37.70 (L) 08/15/2023   LDLCALC 73 08/15/2023   ALT 6 08/15/2023   AST 14 08/15/2023   NA 133 (L) 08/27/2023   K 4.3 08/27/2023   CL 100 08/27/2023   CREATININE 1.15 08/27/2023   BUN 17 08/27/2023   CO2 25 08/27/2023   TSH 0.66 08/15/2023   INR 1.0 06/07/2020   HGBA1C 5.7 08/15/2023    No results found.     Assessment & Plan:  Diarrhea, unspecified type Assessment & Plan: No fever.  No abdominal pain.  Increased diarrhea/loose stool as outlined.  Has a history of collagenous colitis.  Continue bland diet as she is doing.  Remain off magnesium.  Not ingesting increased artificial sweeteners. Decrease protonix to q day given upper symptoms controlled.  Has tried benefiber and probiotics.  Recent GI panel - negative. Check metabolic panel. Has appt with GI this week.    Orders: -  CBC with Differential/Platelet -     Basic metabolic panel  Abnormal liver  function tests Assessment & Plan: Followed by GI.  Has been stable.  Follow liver function tests. Recent abdominal ultrasound ok.    Autoimmune hepatitis (HCC) Assessment & Plan: Followed by GI.  Follow liver function tests. Recent abdominal ultrasound ok.    Barrett's esophagus with esophagitis Assessment & Plan: Upper symptoms controlled.  Continues protonix.     Stage 3a chronic kidney disease (HCC) Assessment & Plan: Avoid antiinflammatories.  Stay hydrated.  Follow metabolic panel.  Check metabolic panel.     Hyperglycemia Assessment & Plan: Low carb diet and exercise.  Follow met b and a1c.    Primary hypertension Assessment & Plan: On benazepril 20mg  bid.  On sotalol - decreasing dose.   Blood pressure doing well.  Follow metabolic panel.    Hypothyroidism, unspecified type Assessment & Plan: On thyroid replacement.  Follow tsh.    SVT (supraventricular tachycardia) Assessment & Plan: No increased heart rate or palpitations reported currently. Continues f/u with cardiology.  Decreasing dose of sotalol.       [place], MD

## 2023-08-28 ENCOUNTER — Encounter: Payer: Self-pay | Admitting: Internal Medicine

## 2023-08-28 LAB — CBC WITH DIFFERENTIAL/PLATELET
Basophils Absolute: 0 10*3/uL (ref 0.0–0.1)
Basophils Relative: 0.7 % (ref 0.0–3.0)
Eosinophils Absolute: 0.1 10*3/uL (ref 0.0–0.7)
Eosinophils Relative: 2.4 % (ref 0.0–5.0)
HCT: 36.6 % (ref 36.0–46.0)
Hemoglobin: 12 g/dL (ref 12.0–15.0)
Lymphocytes Relative: 20.2 % (ref 12.0–46.0)
Lymphs Abs: 1 10*3/uL (ref 0.7–4.0)
MCHC: 32.8 g/dL (ref 30.0–36.0)
MCV: 89.3 fl (ref 78.0–100.0)
Monocytes Absolute: 0.7 10*3/uL (ref 0.1–1.0)
Monocytes Relative: 14.2 % — ABNORMAL HIGH (ref 3.0–12.0)
Neutro Abs: 3 10*3/uL (ref 1.4–7.7)
Neutrophils Relative %: 62.5 % (ref 43.0–77.0)
Platelets: 251 10*3/uL (ref 150.0–400.0)
RBC: 4.1 Mil/uL (ref 3.87–5.11)
RDW: 13.1 % (ref 11.5–15.5)
WBC: 4.8 10*3/uL (ref 4.0–10.5)

## 2023-08-28 LAB — BASIC METABOLIC PANEL
BUN: 17 mg/dL (ref 6–23)
CO2: 25 meq/L (ref 19–32)
Calcium: 10 mg/dL (ref 8.4–10.5)
Chloride: 100 meq/L (ref 96–112)
Creatinine, Ser: 1.15 mg/dL (ref 0.40–1.20)
GFR: 43.01 mL/min — ABNORMAL LOW (ref >60.00)
Glucose, Bld: 101 mg/dL — ABNORMAL HIGH (ref 70–99)
Potassium: 4.3 meq/L (ref 3.5–5.1)
Sodium: 133 meq/L — ABNORMAL LOW (ref 135–145)

## 2023-08-28 NOTE — Assessment & Plan Note (Signed)
Followed by GI.  Has been stable.  Follow liver function tests. Recent abdominal ultrasound ok.

## 2023-08-28 NOTE — Assessment & Plan Note (Signed)
Followed by GI.  Follow liver function tests. Recent abdominal ultrasound ok.

## 2023-08-28 NOTE — Assessment & Plan Note (Signed)
Low carb diet and exercise.  Follow met b and a1c.   

## 2023-08-28 NOTE — Assessment & Plan Note (Signed)
On benazepril 20mg  bid.  On sotalol - decreasing dose.   Blood pressure doing well.  Follow metabolic panel.

## 2023-08-28 NOTE — Assessment & Plan Note (Signed)
Avoid antiinflammatories.  Stay hydrated.  Follow metabolic panel.  Check metabolic panel.   

## 2023-08-28 NOTE — Assessment & Plan Note (Signed)
On thyroid replacement.  Follow tsh.  

## 2023-08-28 NOTE — Assessment & Plan Note (Signed)
No increased heart rate or palpitations reported currently. Continues f/u with cardiology.  Decreasing dose of sotalol.

## 2023-08-28 NOTE — Assessment & Plan Note (Signed)
No fever.  No abdominal pain.  Increased diarrhea/loose stool as outlined.  Has a history of collagenous colitis.  Continue bland diet as she is doing.  Remain off magnesium.  Not ingesting increased artificial sweeteners. Decrease protonix to q day given upper symptoms controlled.  Has tried benefiber and probiotics.  Recent GI panel - negative. Check metabolic panel. Has appt with GI this week.

## 2023-08-28 NOTE — Assessment & Plan Note (Signed)
Upper symptoms controlled.  Continues protonix.

## 2023-08-30 DIAGNOSIS — K52831 Collagenous colitis: Secondary | ICD-10-CM | POA: Diagnosis not present

## 2023-09-04 ENCOUNTER — Other Ambulatory Visit: Payer: Self-pay | Admitting: Internal Medicine

## 2023-09-17 ENCOUNTER — Ambulatory Visit
Admission: RE | Admit: 2023-09-17 | Discharge: 2023-09-17 | Disposition: A | Discharge status: Home / Self Care | Payer: Medicare Other | Source: Ambulatory Visit | Attending: Nurse Practitioner | Admitting: Nurse Practitioner

## 2023-09-17 DIAGNOSIS — Z1231 Encounter for screening mammogram for malignant neoplasm of breast: Secondary | ICD-10-CM | POA: Insufficient documentation

## 2023-09-17 DIAGNOSIS — Z0000 Encounter for general adult medical examination without abnormal findings: Secondary | ICD-10-CM

## 2023-09-23 ENCOUNTER — Ambulatory Visit (INDEPENDENT_AMBULATORY_CARE_PROVIDER_SITE_OTHER): Payer: Medicare Other

## 2023-09-23 DIAGNOSIS — Z23 Encounter for immunization: Secondary | ICD-10-CM | POA: Diagnosis not present

## 2023-09-23 DIAGNOSIS — E538 Deficiency of other specified B group vitamins: Secondary | ICD-10-CM

## 2023-09-23 MED ORDER — CYANOCOBALAMIN 1000 MCG/ML IJ SOLN
1000.0000 ug | Freq: Once | INTRAMUSCULAR | Status: AC
Start: 2023-09-23 — End: 2023-09-23
  Administered 2023-09-23: 1000 ug via INTRAMUSCULAR

## 2023-09-23 NOTE — Progress Notes (Signed)
Pt presented for their vitamin B12 injection. Pt was identified through two identifiers. Pt tolerated shot well in their right deltoid.   Pt also wanted the flu vaccine on today. High dose flu vaccine was offered due to pts age. Pt tolerated injection well in the left deltoid.

## 2023-10-10 DIAGNOSIS — I471 Supraventricular tachycardia, unspecified: Secondary | ICD-10-CM | POA: Diagnosis not present

## 2023-10-10 DIAGNOSIS — I10 Essential (primary) hypertension: Secondary | ICD-10-CM | POA: Diagnosis not present

## 2023-10-10 DIAGNOSIS — I491 Atrial premature depolarization: Secondary | ICD-10-CM | POA: Diagnosis not present

## 2023-10-21 ENCOUNTER — Other Ambulatory Visit: Payer: Self-pay

## 2023-10-21 ENCOUNTER — Ambulatory Visit (INDEPENDENT_AMBULATORY_CARE_PROVIDER_SITE_OTHER): Payer: Medicare Other

## 2023-10-21 DIAGNOSIS — R252 Cramp and spasm: Secondary | ICD-10-CM

## 2023-10-21 DIAGNOSIS — E538 Deficiency of other specified B group vitamins: Secondary | ICD-10-CM | POA: Diagnosis not present

## 2023-10-21 MED ORDER — CHLORZOXAZONE 500 MG PO TABS: ORAL_TABLET | ORAL | 0 refills | Status: AC

## 2023-10-21 MED ORDER — CYANOCOBALAMIN 1000 MCG/ML IJ SOLN
1000.0000 ug | Freq: Once | INTRAMUSCULAR | Status: AC
Start: 2023-10-21 — End: 2023-10-21
  Administered 2023-10-21: 1000 ug via INTRAMUSCULAR

## 2023-10-21 NOTE — Progress Notes (Signed)
Pt presented for their vitamin B12 injection. Pt was identified through two identifiers. Pt tolerated shot well in their left deltoid.   Pt handed me a stabled note that she wanted me to give to the provider. She stated its a refill request that she would like to go to a different pharmacy she stated all the info PCP needed was on the note. The Note has been labeled with pts name and placed in provider's mailbox.

## 2023-10-22 DIAGNOSIS — Z23 Encounter for immunization: Secondary | ICD-10-CM | POA: Diagnosis not present

## 2023-10-25 DIAGNOSIS — K52831 Collagenous colitis: Secondary | ICD-10-CM | POA: Diagnosis not present

## 2023-10-25 DIAGNOSIS — K754 Autoimmune hepatitis: Secondary | ICD-10-CM | POA: Diagnosis not present

## 2023-10-28 ENCOUNTER — Ambulatory Visit (INDEPENDENT_AMBULATORY_CARE_PROVIDER_SITE_OTHER): Payer: Medicare Other | Admitting: Internal Medicine

## 2023-10-28 VITALS — BP 128/74 | HR 65 | Temp 98.0°F | Resp 16 | Ht 61.0 in | Wt 137.8 lb

## 2023-10-28 DIAGNOSIS — R197 Diarrhea, unspecified: Secondary | ICD-10-CM

## 2023-10-28 DIAGNOSIS — M8580 Other specified disorders of bone density and structure, unspecified site: Secondary | ICD-10-CM | POA: Diagnosis not present

## 2023-10-28 DIAGNOSIS — I471 Supraventricular tachycardia, unspecified: Secondary | ICD-10-CM

## 2023-10-28 DIAGNOSIS — E039 Hypothyroidism, unspecified: Secondary | ICD-10-CM

## 2023-10-28 DIAGNOSIS — D472 Monoclonal gammopathy: Secondary | ICD-10-CM | POA: Diagnosis not present

## 2023-10-28 DIAGNOSIS — N1831 Chronic kidney disease, stage 3a: Secondary | ICD-10-CM

## 2023-10-28 DIAGNOSIS — K209 Esophagitis, unspecified without bleeding: Secondary | ICD-10-CM

## 2023-10-28 DIAGNOSIS — R739 Hyperglycemia, unspecified: Secondary | ICD-10-CM

## 2023-10-28 DIAGNOSIS — G479 Sleep disorder, unspecified: Secondary | ICD-10-CM | POA: Diagnosis not present

## 2023-10-28 DIAGNOSIS — K754 Autoimmune hepatitis: Secondary | ICD-10-CM

## 2023-10-28 DIAGNOSIS — I10 Essential (primary) hypertension: Secondary | ICD-10-CM | POA: Diagnosis not present

## 2023-10-28 DIAGNOSIS — K2270 Barrett's esophagus without dysplasia: Secondary | ICD-10-CM

## 2023-11-03 ENCOUNTER — Encounter: Payer: Self-pay | Admitting: Internal Medicine

## 2023-11-03 NOTE — Assessment & Plan Note (Signed)
Low carb diet and exercise.  Follow met b and a1c.   

## 2023-11-03 NOTE — Assessment & Plan Note (Addendum)
No increased heart rate or palpitations reported currently. Continues f/u with cardiology. Last evaluated 10/10/23.  Doing well on current dose of sotalol.

## 2023-11-03 NOTE — Assessment & Plan Note (Signed)
Last bone density 2022 - osteopenia.  Plan to discuss f/u bone density - next visit.

## 2023-11-03 NOTE — Assessment & Plan Note (Signed)
Seeing GI.  On bedesonide taper.  Discussed.  Taking benefiber.

## 2023-11-03 NOTE — Assessment & Plan Note (Signed)
Requires ambien to sleep.  Follow.

## 2023-11-03 NOTE — Assessment & Plan Note (Signed)
Upper symptoms controlled.  Continues protonix.

## 2023-11-03 NOTE — Assessment & Plan Note (Signed)
Followed by GI.  Follow liver function tests. Last abdominal ultrasound ok.

## 2023-11-03 NOTE — Assessment & Plan Note (Signed)
On thyroid replacement.  Follow tsh.  

## 2023-11-03 NOTE — Assessment & Plan Note (Signed)
On benazepril 20mg  bid.  On sotalol.   Blood pressure doing well.  Follow metabolic panel.

## 2023-11-03 NOTE — Assessment & Plan Note (Signed)
Avoid antiinflammatories.  Stay hydrated.  Follow metabolic panel.   

## 2023-11-03 NOTE — Assessment & Plan Note (Signed)
Was worked up by hematology.  Last check - no m spike.  Felt no further w/up warranted.

## 2023-11-07 ENCOUNTER — Other Ambulatory Visit: Payer: Self-pay | Admitting: Gastroenterology

## 2023-11-07 DIAGNOSIS — K754 Autoimmune hepatitis: Secondary | ICD-10-CM

## 2023-11-13 ENCOUNTER — Other Ambulatory Visit: Payer: Self-pay | Admitting: Internal Medicine

## 2023-11-14 ENCOUNTER — Ambulatory Visit
Admission: RE | Admit: 2023-11-14 | Discharge: 2023-11-14 | Disposition: A | Discharge status: Home / Self Care | Payer: Medicare Other | Source: Ambulatory Visit | Attending: Gastroenterology | Admitting: Gastroenterology

## 2023-11-14 DIAGNOSIS — K754 Autoimmune hepatitis: Secondary | ICD-10-CM | POA: Insufficient documentation

## 2023-11-25 ENCOUNTER — Ambulatory Visit (INDEPENDENT_AMBULATORY_CARE_PROVIDER_SITE_OTHER): Payer: Medicare Other

## 2023-11-25 ENCOUNTER — Other Ambulatory Visit: Payer: Self-pay

## 2023-11-25 DIAGNOSIS — E538 Deficiency of other specified B group vitamins: Secondary | ICD-10-CM

## 2023-11-25 MED ORDER — ZOLPIDEM TARTRATE ER 6.25 MG PO TBCR: 6.2500 mg | EXTENDED_RELEASE_TABLET | Freq: Every evening | ORAL | 2 refills | Status: DC | PRN

## 2023-11-25 MED ORDER — CYANOCOBALAMIN 1000 MCG/ML IJ SOLN
1000.0000 ug | Freq: Once | INTRAMUSCULAR | Status: AC
Start: 2023-11-25 — End: 2023-11-25
  Administered 2023-11-25: 1000 ug via INTRAMUSCULAR

## 2023-11-25 NOTE — Progress Notes (Signed)
Pt presented for their vitamin B12 injection. Pt was identified through two identifiers. Pt tolerated shot well in their right deltoid.    Pt also had a note stating she needed refills.

## 2023-11-25 NOTE — Telephone Encounter (Signed)
Pt came in for a b12 injection. Pt had a note that stated she needed Zolpidem refilled    Lov: 10/28/23  Nov: 01/28/24   E-Prescribing Status: Receipt confirmed by pharmacy (04/20/2023  8:21 PM EDT)

## 2023-12-26 ENCOUNTER — Ambulatory Visit: Payer: Medicare Other

## 2023-12-26 DIAGNOSIS — E538 Deficiency of other specified B group vitamins: Secondary | ICD-10-CM | POA: Diagnosis not present

## 2023-12-26 MED ORDER — CYANOCOBALAMIN 1000 MCG/ML IJ SOLN
1000.0000 ug | Freq: Once | INTRAMUSCULAR | Status: AC
  Administered 2023-12-26: 1000 ug via INTRAMUSCULAR

## 2023-12-26 NOTE — Progress Notes (Addendum)
Patient presented for b12 injection to left deltoid, patient voiced no concerns nor showed any signs of distress during injection

## 2024-01-13 ENCOUNTER — Telehealth: Payer: Self-pay | Admitting: Internal Medicine

## 2024-01-13 DIAGNOSIS — R739 Hyperglycemia, unspecified: Secondary | ICD-10-CM

## 2024-01-13 DIAGNOSIS — Z13220 Encounter for screening for lipoid disorders: Secondary | ICD-10-CM

## 2024-01-13 DIAGNOSIS — I10 Essential (primary) hypertension: Secondary | ICD-10-CM

## 2024-01-13 NOTE — Telephone Encounter (Signed)
 Patient need orders

## 2024-01-14 DIAGNOSIS — K754 Autoimmune hepatitis: Secondary | ICD-10-CM | POA: Diagnosis not present

## 2024-01-14 DIAGNOSIS — K52831 Collagenous colitis: Secondary | ICD-10-CM | POA: Diagnosis not present

## 2024-01-14 NOTE — Addendum Note (Signed)
 Addended by: Rita Ohara D on: 01/14/2024 01:55 PM   Modules accepted: Orders

## 2024-01-14 NOTE — Telephone Encounter (Signed)
 Labs ordered.

## 2024-01-24 ENCOUNTER — Other Ambulatory Visit (INDEPENDENT_AMBULATORY_CARE_PROVIDER_SITE_OTHER): Payer: Medicare Other

## 2024-01-24 DIAGNOSIS — R739 Hyperglycemia, unspecified: Secondary | ICD-10-CM

## 2024-01-24 DIAGNOSIS — I10 Essential (primary) hypertension: Secondary | ICD-10-CM

## 2024-01-24 DIAGNOSIS — Z13220 Encounter for screening for lipoid disorders: Secondary | ICD-10-CM | POA: Diagnosis not present

## 2024-01-24 LAB — LIPID PANEL
Cholesterol: 175 mg/dL (ref 0–200)
HDL: 54.9 mg/dL (ref >39.00)
LDL Cholesterol: 90 mg/dL (ref 0–99)
NonHDL: 120.56
Total CHOL/HDL Ratio: 3
Triglycerides: 153 mg/dL — ABNORMAL HIGH (ref 0.0–149.0)
VLDL: 30.6 mg/dL (ref 0.0–40.0)

## 2024-01-24 LAB — BASIC METABOLIC PANEL
BUN: 32 mg/dL — ABNORMAL HIGH (ref 6–23)
CO2: 25 meq/L (ref 19–32)
Calcium: 10 mg/dL (ref 8.4–10.5)
Chloride: 104 meq/L (ref 96–112)
Creatinine, Ser: 1.06 mg/dL (ref 0.40–1.20)
GFR: 47.3 mL/min — ABNORMAL LOW (ref >60.00)
Glucose, Bld: 110 mg/dL — ABNORMAL HIGH (ref 70–99)
Potassium: 4.1 meq/L (ref 3.5–5.1)
Sodium: 139 meq/L (ref 135–145)

## 2024-01-24 LAB — HEPATIC FUNCTION PANEL
ALT: 13 U/L (ref 0–35)
AST: 20 U/L (ref 0–37)
Albumin: 4.4 g/dL (ref 3.5–5.2)
Alkaline Phosphatase: 54 U/L (ref 39–117)
Bilirubin, Direct: 0.2 mg/dL (ref 0.0–0.3)
Total Bilirubin: 1 mg/dL (ref 0.2–1.2)
Total Protein: 7.7 g/dL (ref 6.0–8.3)

## 2024-01-24 LAB — HEMOGLOBIN A1C: Hgb A1c MFr Bld: 6.1 % (ref 4.6–6.5)

## 2024-01-28 ENCOUNTER — Encounter: Payer: Self-pay | Admitting: Internal Medicine

## 2024-01-28 ENCOUNTER — Ambulatory Visit: Payer: Medicare Other | Admitting: Internal Medicine

## 2024-01-28 VITALS — BP 120/72 | HR 70 | Temp 98.0°F | Resp 16 | Ht 61.0 in | Wt 140.0 lb

## 2024-01-28 DIAGNOSIS — E538 Deficiency of other specified B group vitamins: Secondary | ICD-10-CM

## 2024-01-28 DIAGNOSIS — E039 Hypothyroidism, unspecified: Secondary | ICD-10-CM

## 2024-01-28 DIAGNOSIS — N1831 Chronic kidney disease, stage 3a: Secondary | ICD-10-CM

## 2024-01-28 DIAGNOSIS — I4949 Other premature depolarization: Secondary | ICD-10-CM

## 2024-01-28 DIAGNOSIS — I10 Essential (primary) hypertension: Secondary | ICD-10-CM | POA: Diagnosis not present

## 2024-01-28 DIAGNOSIS — K754 Autoimmune hepatitis: Secondary | ICD-10-CM | POA: Diagnosis not present

## 2024-01-28 DIAGNOSIS — Z13220 Encounter for screening for lipoid disorders: Secondary | ICD-10-CM | POA: Diagnosis not present

## 2024-01-28 DIAGNOSIS — D472 Monoclonal gammopathy: Secondary | ICD-10-CM | POA: Diagnosis not present

## 2024-01-28 DIAGNOSIS — R739 Hyperglycemia, unspecified: Secondary | ICD-10-CM | POA: Diagnosis not present

## 2024-01-28 DIAGNOSIS — K209 Esophagitis, unspecified without bleeding: Secondary | ICD-10-CM | POA: Diagnosis not present

## 2024-01-28 DIAGNOSIS — K2270 Barrett's esophagus without dysplasia: Secondary | ICD-10-CM | POA: Diagnosis not present

## 2024-01-28 DIAGNOSIS — I471 Supraventricular tachycardia, unspecified: Secondary | ICD-10-CM | POA: Diagnosis not present

## 2024-01-28 HISTORY — DX: Other premature depolarization: I49.49

## 2024-01-28 MED ORDER — CYANOCOBALAMIN 1000 MCG/ML IJ SOLN
1000.0000 ug | Freq: Once | INTRAMUSCULAR | Status: AC
  Administered 2024-01-29: 1000 ug via INTRAMUSCULAR

## 2024-01-28 NOTE — Progress Notes (Addendum)
Subjective:    Patient ID: Lori [name], female    DOB: [birth year], 89 y.o.   MRN: [id number]  Patient here for  Chief Complaint  Patient presents with   Medical Management of Chronic Issues    HPI Here for a scheduled follow up - follow up regarding hypertension, bradycardia/SVT, hypothyroidism and anemia. On sotalol and benazepril. Seeing cardiology  - stable - no changes  Seeing GI - f/u microscopic colitis.  Had f/u with GI 01/14/24 - on budesonide. GI decreased to 3mg  every other day. Continues delzicol. Also taking colace. AIH - stable. Bowels overall relatively stable. Still occasional flares. No chest pain or sob reported.     Past Medical History:  Diagnosis Date   Anemia    iron deficient   Arthritis    Autoimmune hepatitis (HCC) 05/10/2016   Hep C   B12 deficiency    Barrett esophagus 01/2016   Bradycardia    Chronic kidney disease    stage III   Diverticulosis    GERD (gastroesophageal reflux disease)    Hypertension    Hypothyroidism    Premature beats 01/28/2024   Ulcer disease    Past Surgical History:  Procedure Laterality Date   CATARACT EXTRACTION W/PHACO Left 08/30/2021   Procedure: CATARACT EXTRACTION PHACO AND INTRAOCULAR LENS PLACEMENT (IOC) LEFT 8.21 01:00.3;  Surgeon: Lockie Mola, MD;  Location: [facility];  Service: Ophthalmology;  Laterality: Left;   CATARACT EXTRACTION W/PHACO Right 09/13/2021   Procedure: CATARACT EXTRACTION PHACO AND INTRAOCULAR LENS PLACEMENT (IOC) RIGHT;  Surgeon: Lockie Mola, MD;  Location: [facility];  Service: Ophthalmology;  Laterality: Right;  8.11 00:59.8   CHOLECYSTECTOMY N/A 06/25/2018   Procedure: LAPAROSCOPIC CHOLECYSTECTOMY WITH INTRAOPERATIVE CHOLANGIOGRAM;  Surgeon: Earline Mayotte, MD;  Location: [facility] ORS;  Service: General;  Laterality: N/A;   ESOPHAGOGASTRODUODENOSCOPY (EGD) WITH PROPOFOL N/A 01/30/2016   Procedure: ESOPHAGOGASTRODUODENOSCOPY (EGD) WITH PROPOFOL;   Surgeon: Christena Deem, MD;  Location: [facility] END[facility];  Service: Endoscopy;  Laterality: N/A;   ESOPHAGOGASTRODUODENOSCOPY (EGD) WITH PROPOFOL N/A 01/14/2018   Procedure: ESOPHAGOGASTRODUODENOSCOPY (EGD) WITH PROPOFOL;  Surgeon: Christena Deem, MD;  Location: [facility] ENDOSCOPY;  Service: Endoscopy;  Laterality: N/A;   ESOPHAGOGASTRODUODENOSCOPY (EGD) WITH PROPOFOL N/A 05/20/2018   Procedure: ESOPHAGOGASTRODUODENOSCOPY (EGD) WITH PROPOFOL;  Surgeon: Christena Deem, MD;  Location: [facility] ENDOSCOPY;  Service: Endoscopy;  Laterality: N/A;   ESOPHAGOGASTRODUODENOSCOPY (EGD) WITH PROPOFOL N/A 01/13/2019   Procedure: ESOPHAGOGASTRODUODENOSCOPY (EGD) WITH PROPOFOL;  Surgeon: Christena Deem, MD;  Location: [facility];  Service: Endoscopy;  Laterality: N/A;   HERNIA REPAIR     lap hiatal hernia repair  07/08/08   LEFT OOPHORECTOMY  1993   benign ovarian cyst   TUBAL LIGATION  1981   Family History  Problem Relation Age of Onset   Stroke Mother    Breast cancer Neg Hx    Colon cancer Neg Hx    Social History   Socioeconomic History   Marital status: Widowed    Spouse name: Not on file   Number of children: 0   Years of education: Not on file   Highest education level: Some college, no degree  Occupational History   Not on file  Tobacco Use   Smoking status: Never   Smokeless tobacco: Never  Vaping Use   Vaping status: Never Used  Substance and Sexual Activity   Alcohol use: No    Alcohol/week: 0.0 standard drinks of alcohol   Drug use: No   Sexual activity:  Never  Other Topics Concern   Not on file  Social History Narrative   Widowed   Social Drivers of Health   Financial Resource Strain: Low Risk  (10/24/2023)   Overall Financial Resource Strain (CARDIA)    Difficulty of Paying Living Expenses: Not hard at all  Food Insecurity: No Food Insecurity (01/24/2024)   Hunger Vital Sign    Worried About Running Out of Food in the Last Year: Never true    Ran Out of Food in  the Last Year: Never true  Transportation Needs: No Transportation Needs (10/24/2023)   PRAPARE - Administrator, Civil Service (Medical): No    Lack of Transportation (Non-Medical): No  Physical Activity: Sufficiently Active (01/24/2024)   Exercise Vital Sign    Days of Exercise per Week: 3 days    Minutes of Exercise per Session: 150+ min  Stress: No Stress Concern Present (01/24/2024)   [employer] of Occupational Health - Occupational Stress Questionnaire    Feeling of Stress : Not at all  Social Connections: Moderately Isolated (01/24/2024)   Social Connection and Isolation Panel [NHANES]    Frequency of Communication with Friends and Family: More than three times a week    Frequency of Social Gatherings with Friends and Family: Twice a week    Attends Religious Services: More than 4 times per year    Active Member of [employer] or Organizations: No    Attends Banker Meetings: Never    Marital Status: Widowed     Review of Systems  Constitutional:  Negative for appetite change and unexpected weight change.  HENT:  Negative for congestion and sinus pressure.   Respiratory:  Negative for cough, chest tightness and shortness of breath.   Cardiovascular:  Negative for chest pain and palpitations.  Gastrointestinal:  Negative for abdominal pain, nausea and vomiting.  Genitourinary:  Negative for difficulty urinating and dysuria.  Musculoskeletal:  Negative for joint swelling and myalgias.  Skin:  Negative for color change and rash.  Neurological:  Negative for dizziness and headaches.  Psychiatric/Behavioral:  Negative for agitation and dysphoric mood.        Objective:     BP 120/72   Pulse 70   Temp 98 F (36.7 C)   Resp 16   Ht 5\' 1"  (1.549 m)   Wt 140 lb (63.5 kg)   SpO2 98%   BMI 26.45 kg/m  Wt Readings from Last 3 Encounters:  01/28/24 140 lb (63.5 kg)  10/28/23 137 lb 12.8 oz (62.5 kg)  08/27/23 135 lb 3.2 oz (61.3 kg)    Physical  Exam Vitals reviewed.  Constitutional:      General: She is not in acute distress.    Appearance: Normal appearance.  HENT:     Head: Normocephalic and atraumatic.     Right Ear: External ear normal.     Left Ear: External ear normal.     Mouth/Throat:     Pharynx: No oropharyngeal exudate or posterior oropharyngeal erythema.  Eyes:     General: No scleral icterus.       Right eye: No discharge.        Left eye: No discharge.     Conjunctiva/sclera: Conjunctivae normal.  Neck:     Thyroid: No thyromegaly.  Cardiovascular:     Rate and Rhythm: Normal rate and regular rhythm.     Comments: Premature beats.  Pulmonary:     Effort: No respiratory distress.  Breath sounds: Normal breath sounds. No wheezing.  Abdominal:     General: Bowel sounds are normal.     Palpations: Abdomen is soft.     Tenderness: There is no abdominal tenderness.  Musculoskeletal:        General: No swelling or tenderness.     Cervical back: Neck supple. No tenderness.  Lymphadenopathy:     Cervical: No cervical adenopathy.  Skin:    Findings: No erythema or rash.  Neurological:     Mental Status: She is alert.  Psychiatric:        Mood and Affect: Mood normal.        Behavior: Behavior normal.         Outpatient Encounter Medications as of 01/28/2024  Medication Sig   budesonide (ENTOCORT EC) 3 MG 24 hr capsule Take 3 mg by mouth every other day.   hydrochlorothiazide (HYDRODIURIL) 12.5 MG tablet Take 12.5 mg by mouth daily.   aspirin 81 MG tablet Take 81 mg by mouth daily.   benazepril (LOTENSIN) 20 MG tablet Take 1 tablet by mouth 2 (two) times daily.   budesonide (ENTOCORT EC) 3 MG 24 hr capsule Take by mouth.   calcitonin, salmon, (MIACALCIN/FORTICAL) 200 UNIT/ACT nasal spray ONE SPRAY ALTERNATING NOSTRILS EVERY OTHER DAY   Calcium-Phosphorus-Vitamin D (CITRACAL +D3 PO) Take 1 tablet by mouth daily.   chlorzoxazone (PARAFON) 500 MG tablet TAKE 1 TABLET BY MOUTH EVERY DAY AT BEDTIME AS  NEEDED. DO NOT TAKE WITH AMBIEN.   Cyanocobalamin (B-12) 1000 MCG/ML KIT Inject 1,000 mcg as directed every 30 (thirty) days.    GLUCOSAMINE-CHONDROITIN PO Take 1 tablet by mouth 2 (two) times daily.    levothyroxine (SYNTHROID) 88 MCG tablet TAKE 1 TABLET BY MOUTH EVERY DAY   Mesalamine (ASACOL) 400 MG CPDR DR capsule Take 400 mg by mouth 2 (two) times daily.    pantoprazole (PROTONIX) 40 MG tablet Take 40 mg by mouth daily.   sotalol (BETAPACE) 80 MG tablet Take 0.5 mg by mouth daily.   Wheat Dextrin (BENEFIBER DRINK MIX) PACK Take 1 each by mouth daily.    zolpidem (AMBIEN CR) 6.25 MG CR tablet Take 1 tablet (6.25 mg total) by mouth at bedtime as needed. for sleep   [EXPIRED] cyanocobalamin (VITAMIN B12) injection 1,000 mcg    No facility-administered encounter medications on file as of 01/28/2024.     Lab Results  Component Value Date   WBC 4.8 08/27/2023   HGB 12.0 08/27/2023   HCT 36.6 08/27/2023   PLT 251.0 08/27/2023   GLUCOSE 110 (H) 01/24/2024   CHOL 175 01/24/2024   TRIG 153.0 (H) 01/24/2024   HDL 54.90 01/24/2024   LDLCALC 90 01/24/2024   ALT 13 01/24/2024   AST 20 01/24/2024   NA 139 01/24/2024   K 4.1 01/24/2024   CL 104 01/24/2024   CREATININE 1.06 01/24/2024   BUN 32 (H) 01/24/2024   CO2 25 01/24/2024   TSH 0.66 08/15/2023   INR 1.0 06/07/2020   HGBA1C 6.1 01/24/2024    US Abdomen Complete Result Date: 11/21/2023 : PROCEDURE: ULTRASOUND ABDOMEN COMPLETE HISTORY: Patient is a 89 y/o Female with hepatitis. Cholecystectomy. COMPARISON: U/S abdomen 03/25/2023, 03/14/2022. TECHNIQUE: Two-dimensional grayscale, color and spectral Doppler ultrasound of the abdomen was performed. FINDINGS: The pancreas demonstrates a normal homogenous echotexture with the tail suboptimally visualized due to overlying bowel gas. The liver demonstrates mildly increased echotexture without intrahepatic biliary dilatation. No masses are visualized. The main portal vein demonstrates normal  hepatopedal flow. The gallbladder is surgically absent. The common bile duct is dilated measuring 1.1 cm, often seen after cholecystectomy. The right kidney measures 9.0 cm in length. Renal cortical echotexture is mildly increased. There is no hydronephrosis. There are no stones. There is 1.7 cm superior pole cortex. The left kidney measures 9.2 cm in length. Renal cortical echotexture is mildly increased. There is no hydronephrosis. There are no stones. There are no cysts. The spleen measures 9.3 cm in length and demonstrates normal echotexture. There is no evidence of aneurysm within the visualized segments of the abdominal aorta. The visualized segments of the IVC are unremarkable. IMPRESSION: 1. Mildly increased hepatic echotexture, consistent with provided history of liver disease/hepatitis. 2. Surgically absent gallbladder with dilatation of the common bile duct, often seen after cholecystectomy. 3. Increased echogenicity of the bilateral renal cortices and a simple cyst, suggestive of medical renal disease. Thank you for allowing [place] to assist in the care of this patient. Electronically Signed   By: Lestine Box M.D.   On: 11/21/2023 11:31       Assessment & Plan:  Hyperglycemia Assessment & Plan: Low carb diet and exercise.  Follow met b and a1c.   Orders: -     Hemoglobin A1c; Future  B12 deficiency -     Cyanocobalamin  Screening cholesterol level -     Lipid panel; Future  Primary hypertension Assessment & Plan: On benazepril 20mg  bid.  On sotalol.   Blood pressure doing well.  Follow metabolic panel.   Orders: -     Hepatic function panel; Future -     Basic metabolic panel; Future  Premature beats Assessment & Plan: Noted on exam. EKG - SR/SR with occasional PAC. TWI precordium. No significant change from previous. Followed by cardiology. Feels symptoms stable.   Orders: -     EKG 12-Lead  SVT (supraventricular tachycardia) (HCC) Assessment & Plan: No increased heart  rate or palpitations reported currently. Continues f/u with cardiology. Last evaluated 10/10/23.  Doing well on current dose of sotalol.    MGUS (monoclonal gammopathy of unknown significance) Assessment & Plan: Was worked up by hematology.  Last check - no m spike.  Felt no further w/up warranted.     Hypothyroidism, unspecified type Assessment & Plan: On thyroid replacement.  Follow tsh.    Stage 3a chronic kidney disease (HCC) Assessment & Plan: Avoid antiinflammatories.  Stay hydrated.  Follow metabolic panel.    Autoimmune hepatitis (HCC) Assessment & Plan: Followed by GI.  Follow liver function tests. Last abdominal ultrasound ok.    Barrett's esophagus with esophagitis Assessment & Plan: Upper symptoms controlled.  Continues protonix.        Dale Ben [place]on, MD

## 2024-01-29 DIAGNOSIS — E538 Deficiency of other specified B group vitamins: Secondary | ICD-10-CM | POA: Diagnosis not present

## 2024-02-02 ENCOUNTER — Encounter: Payer: Self-pay | Admitting: Internal Medicine

## 2024-02-02 NOTE — Addendum Note (Signed)
Addended by: Charm Barges on: 02/02/2024 07:19 PM   Modules accepted: Level of Service

## 2024-02-02 NOTE — Assessment & Plan Note (Signed)
Avoid antiinflammatories.  Stay hydrated.  Follow metabolic panel.   

## 2024-02-02 NOTE — Assessment & Plan Note (Signed)
 Followed by GI.  Follow liver function tests. Last abdominal ultrasound ok.

## 2024-02-02 NOTE — Assessment & Plan Note (Signed)
Upper symptoms controlled.  Continues protonix.

## 2024-02-02 NOTE — Assessment & Plan Note (Signed)
 Low carb diet and exercise.  Follow met b and a1c.

## 2024-02-02 NOTE — Assessment & Plan Note (Signed)
Was worked up by hematology.  Last check - no m spike.  Felt no further w/up warranted.

## 2024-02-02 NOTE — Assessment & Plan Note (Signed)
 No increased heart rate or palpitations reported currently. Continues f/u with cardiology. Last evaluated 10/10/23.  Doing well on current dose of sotalol.

## 2024-02-02 NOTE — Assessment & Plan Note (Signed)
 On benazepril 20mg  bid.  On sotalol.   Blood pressure doing well.  Follow metabolic panel.

## 2024-02-02 NOTE — Assessment & Plan Note (Signed)
 On thyroid replacement.  Follow tsh.

## 2024-02-02 NOTE — Assessment & Plan Note (Signed)
Noted on exam. EKG - SR/SR with occasional PAC. TWI precordium. No significant change from previous. Followed by cardiology. Feels symptoms stable.

## 2024-02-28 ENCOUNTER — Ambulatory Visit (INDEPENDENT_AMBULATORY_CARE_PROVIDER_SITE_OTHER): Payer: Medicare Other

## 2024-02-28 DIAGNOSIS — E538 Deficiency of other specified B group vitamins: Secondary | ICD-10-CM

## 2024-02-28 MED ORDER — CYANOCOBALAMIN 1000 MCG/ML IJ SOLN
1000.0000 ug | Freq: Once | INTRAMUSCULAR | Status: AC
  Administered 2024-02-28: 1000 ug via INTRAMUSCULAR

## 2024-02-28 NOTE — Progress Notes (Signed)
After obtaining consent, and per orders of Dr. Scott, injection of B12 given IM in right deltoid by Teruko Joswick Lynn. Patient tolerated injection well.  

## 2024-03-04 ENCOUNTER — Other Ambulatory Visit: Payer: Self-pay | Admitting: Internal Medicine

## 2024-03-27 ENCOUNTER — Ambulatory Visit: Payer: Medicare Other

## 2024-03-27 DIAGNOSIS — E538 Deficiency of other specified B group vitamins: Secondary | ICD-10-CM | POA: Diagnosis not present

## 2024-03-27 MED ORDER — CYANOCOBALAMIN 1000 MCG/ML IJ SOLN
1000.0000 ug | Freq: Once | INTRAMUSCULAR | Status: AC
Start: 2024-03-27 — End: 2024-03-27
  Administered 2024-03-27: 1000 ug via INTRAMUSCULAR

## 2024-03-27 NOTE — Progress Notes (Signed)
 Patient presented for B 12 injection to left deltoid, patient voiced no concerns nor showed any signs of distress during injection.

## 2024-04-01 ENCOUNTER — Other Ambulatory Visit: Payer: Self-pay | Admitting: Internal Medicine

## 2024-04-13 DIAGNOSIS — I10 Essential (primary) hypertension: Secondary | ICD-10-CM | POA: Diagnosis not present

## 2024-04-13 DIAGNOSIS — R001 Bradycardia, unspecified: Secondary | ICD-10-CM | POA: Diagnosis not present

## 2024-04-13 DIAGNOSIS — I471 Supraventricular tachycardia, unspecified: Secondary | ICD-10-CM | POA: Diagnosis not present

## 2024-04-13 DIAGNOSIS — I491 Atrial premature depolarization: Secondary | ICD-10-CM | POA: Diagnosis not present

## 2024-04-16 DIAGNOSIS — H04123 Dry eye syndrome of bilateral lacrimal glands: Secondary | ICD-10-CM | POA: Diagnosis not present

## 2024-04-16 DIAGNOSIS — H40003 Preglaucoma, unspecified, bilateral: Secondary | ICD-10-CM | POA: Diagnosis not present

## 2024-04-16 DIAGNOSIS — H43813 Vitreous degeneration, bilateral: Secondary | ICD-10-CM | POA: Diagnosis not present

## 2024-04-16 DIAGNOSIS — H35372 Puckering of macula, left eye: Secondary | ICD-10-CM | POA: Diagnosis not present

## 2024-04-27 ENCOUNTER — Ambulatory Visit (INDEPENDENT_AMBULATORY_CARE_PROVIDER_SITE_OTHER)

## 2024-04-27 DIAGNOSIS — E538 Deficiency of other specified B group vitamins: Secondary | ICD-10-CM | POA: Diagnosis not present

## 2024-04-27 MED ORDER — CYANOCOBALAMIN 1000 MCG/ML IJ SOLN
1000.0000 ug | Freq: Once | INTRAMUSCULAR | Status: AC
  Administered 2024-04-27: 1000 ug via INTRAMUSCULAR

## 2024-04-27 NOTE — Progress Notes (Signed)
 Pt presented for their vitamin B12 injection. Pt was identified through two identifiers. Pt tolerated shot well in their right deltoid.

## 2024-05-19 DIAGNOSIS — K52831 Collagenous colitis: Secondary | ICD-10-CM | POA: Diagnosis not present

## 2024-05-19 DIAGNOSIS — K5900 Constipation, unspecified: Secondary | ICD-10-CM | POA: Diagnosis not present

## 2024-05-19 DIAGNOSIS — R140 Abdominal distension (gaseous): Secondary | ICD-10-CM | POA: Diagnosis not present

## 2024-05-26 ENCOUNTER — Other Ambulatory Visit (INDEPENDENT_AMBULATORY_CARE_PROVIDER_SITE_OTHER): Payer: Medicare Other

## 2024-05-26 DIAGNOSIS — Z13220 Encounter for screening for lipoid disorders: Secondary | ICD-10-CM

## 2024-05-26 DIAGNOSIS — R739 Hyperglycemia, unspecified: Secondary | ICD-10-CM

## 2024-05-26 DIAGNOSIS — I10 Essential (primary) hypertension: Secondary | ICD-10-CM

## 2024-05-26 LAB — BASIC METABOLIC PANEL WITH GFR
BUN: 35 mg/dL — ABNORMAL HIGH (ref 6–23)
CO2: 26 meq/L (ref 19–32)
Calcium: 10.2 mg/dL (ref 8.4–10.5)
Chloride: 102 meq/L (ref 96–112)
Creatinine, Ser: 1.06 mg/dL (ref 0.40–1.20)
GFR: 47.19 mL/min — ABNORMAL LOW (ref >60.00)
Glucose, Bld: 96 mg/dL (ref 70–99)
Potassium: 4.9 meq/L (ref 3.5–5.1)
Sodium: 136 meq/L (ref 135–145)

## 2024-05-26 LAB — HEPATIC FUNCTION PANEL
ALT: 13 U/L (ref 0–35)
AST: 19 U/L (ref 0–37)
Albumin: 4.4 g/dL (ref 3.5–5.2)
Alkaline Phosphatase: 44 U/L (ref 39–117)
Bilirubin, Direct: 0.2 mg/dL (ref 0.0–0.3)
Total Bilirubin: 1.3 mg/dL — ABNORMAL HIGH (ref 0.2–1.2)
Total Protein: 7.7 g/dL (ref 6.0–8.3)

## 2024-05-26 LAB — LIPID PANEL
Cholesterol: 156 mg/dL (ref 0–200)
HDL: 47.9 mg/dL (ref >39.00)
LDL Cholesterol: 85 mg/dL (ref 0–99)
NonHDL: 107.78
Total CHOL/HDL Ratio: 3
Triglycerides: 112 mg/dL (ref 0.0–149.0)
VLDL: 22.4 mg/dL (ref 0.0–40.0)

## 2024-05-26 LAB — HEMOGLOBIN A1C: Hgb A1c MFr Bld: 5.8 % (ref 4.6–6.5)

## 2024-05-27 ENCOUNTER — Ambulatory Visit: Payer: Self-pay | Admitting: Internal Medicine

## 2024-05-28 ENCOUNTER — Ambulatory Visit (INDEPENDENT_AMBULATORY_CARE_PROVIDER_SITE_OTHER): Payer: Medicare Other | Admitting: Internal Medicine

## 2024-05-28 VITALS — BP 118/68 | HR 60 | Temp 98.0°F | Resp 16 | Ht 61.0 in | Wt 137.8 lb

## 2024-05-28 DIAGNOSIS — K209 Esophagitis, unspecified without bleeding: Secondary | ICD-10-CM | POA: Diagnosis not present

## 2024-05-28 DIAGNOSIS — K754 Autoimmune hepatitis: Secondary | ICD-10-CM

## 2024-05-28 DIAGNOSIS — Z0000 Encounter for general adult medical examination without abnormal findings: Secondary | ICD-10-CM

## 2024-05-28 DIAGNOSIS — N1831 Chronic kidney disease, stage 3a: Secondary | ICD-10-CM | POA: Diagnosis not present

## 2024-05-28 DIAGNOSIS — D649 Anemia, unspecified: Secondary | ICD-10-CM

## 2024-05-28 DIAGNOSIS — K2270 Barrett's esophagus without dysplasia: Secondary | ICD-10-CM

## 2024-05-28 DIAGNOSIS — I10 Essential (primary) hypertension: Secondary | ICD-10-CM

## 2024-05-28 DIAGNOSIS — R739 Hyperglycemia, unspecified: Secondary | ICD-10-CM | POA: Diagnosis not present

## 2024-05-28 DIAGNOSIS — E039 Hypothyroidism, unspecified: Secondary | ICD-10-CM | POA: Diagnosis not present

## 2024-05-28 DIAGNOSIS — I471 Supraventricular tachycardia, unspecified: Secondary | ICD-10-CM | POA: Diagnosis not present

## 2024-05-28 DIAGNOSIS — R197 Diarrhea, unspecified: Secondary | ICD-10-CM | POA: Diagnosis not present

## 2024-05-28 DIAGNOSIS — D472 Monoclonal gammopathy: Secondary | ICD-10-CM | POA: Diagnosis not present

## 2024-05-28 DIAGNOSIS — R7989 Other specified abnormal findings of blood chemistry: Secondary | ICD-10-CM

## 2024-05-28 DIAGNOSIS — E538 Deficiency of other specified B group vitamins: Secondary | ICD-10-CM

## 2024-05-28 DIAGNOSIS — Z13220 Encounter for screening for lipoid disorders: Secondary | ICD-10-CM

## 2024-05-28 MED ORDER — CYANOCOBALAMIN 1000 MCG/ML IJ SOLN
1000.0000 ug | Freq: Once | INTRAMUSCULAR | Status: AC
  Administered 2024-05-28: 1000 ug via INTRAMUSCULAR

## 2024-05-28 NOTE — Assessment & Plan Note (Signed)
 Physical today 05/28/24.   Mammogram 09/17/23 - birads I.  Followed by GI.

## 2024-05-28 NOTE — Progress Notes (Signed)
 Subjective:    Patient ID: Lori [name], female    DOB: 12/26/1934, 89 y.o.   MRN: [id number]  Patient here for  Chief Complaint  Patient presents with   Annual Exam    HPI With past history of hypertension, bradycardia/SVT, anemia and hypothyroidism, she comes in today to follow up on these issues as well as for a complete physical exam. On sotalol  and benazepril . Seeing cardiology. Last f/u 04/13/24 - stable. No changes. Evaluated by GI 05/19/24 - f/u collagenous colitis. Off budesonide. Taking delzicol  400mg  bid. Having trouble with constipation. Instructed to increase miralax - 2 doses daily. Continue fiber and adequate hydration. Prn senna. KUB - nonspecific nonobstructed bowel gas pattern. Moderate amount of stool retention. She tried bid dosing, but did not tolerate. Back to q day now. Breathing stable. States blood pressures doing well - home averaging 128-132/68-72.    Past Medical History:  Diagnosis Date   Anemia    iron deficient   Arthritis    Autoimmune hepatitis (HCC) 05/10/2016   Hep C   B12 deficiency    Barrett esophagus 01/2016   Bradycardia    Chronic kidney disease    stage III   Diverticulosis    GERD (gastroesophageal reflux disease)    Hypertension    Hypothyroidism    Premature beats 01/28/2024   Ulcer disease    Past Surgical History:  Procedure Laterality Date   CATARACT EXTRACTION W/PHACO Left 08/30/2021   Procedure: CATARACT EXTRACTION PHACO AND INTRAOCULAR LENS PLACEMENT (IOC) LEFT 8.21 01:00.3;  Surgeon: Annell Kidney, MD;  Location: [facility];  Service: Ophthalmology;  Laterality: Left;   CATARACT EXTRACTION W/PHACO Right 09/13/2021   Procedure: CATARACT EXTRACTION PHACO AND INTRAOCULAR LENS PLACEMENT (IOC) RIGHT;  Surgeon: Annell Kidney, MD;  Location: [facility];  Service: Ophthalmology;  Laterality: Right;  8.11 00:59.8   CHOLECYSTECTOMY N/A 06/25/2018   Procedure: LAPAROSCOPIC CHOLECYSTECTOMY WITH  INTRAOPERATIVE CHOLANGIOGRAM;  Surgeon: Marshall Skeeter, MD;  Location: [facility];  Service: General;  Laterality: N/A;   ESOPHAGOGASTRODUODENOSCOPY (EGD) WITH PROPOFOL  N/A 01/30/2016   Procedure: ESOPHAGOGASTRODUODENOSCOPY (EGD) WITH PROPOFOL ;  Surgeon: Deveron Fly, MD;  Location: [facility];  Service: Endoscopy;  Laterality: N/A;   ESOPHAGOGASTRODUODENOSCOPY (EGD) WITH PROPOFOL  N/A 01/14/2018   Procedure: ESOPHAGOGASTRODUODENOSCOPY (EGD) WITH PROPOFOL ;  Surgeon: Deveron Fly, MD;  Location: [facility];  Service: Endoscopy;  Laterality: N/A;   ESOPHAGOGASTRODUODENOSCOPY (EGD) WITH PROPOFOL  N/A 05/20/2018   Procedure: ESOPHAGOGASTRODUODENOSCOPY (EGD) WITH PROPOFOL ;  Surgeon: Deveron Fly, MD;  Location: [facility];  Service: Endoscopy;  Laterality: N/A;   ESOPHAGOGASTRODUODENOSCOPY (EGD) WITH PROPOFOL  N/A 01/13/2019   Procedure: ESOPHAGOGASTRODUODENOSCOPY (EGD) WITH PROPOFOL ;  Surgeon: Deveron Fly, MD;  [facility];  Service: Endoscopy;  Laterality: N/A;   HERNIA REPAIR     lap hiatal hernia repair  07/08/08   LEFT OOPHORECTOMY  1993   benign ovarian cyst   TUBAL LIGATION  1981   Family History  Problem Relation Age of Onset   Stroke Mother    Breast cancer Neg Hx    Colon cancer Neg Hx    Social History   Socioeconomic History   Marital status: Widowed    Spouse name: Not on file   Number of children: 0   Years of education: Not on file   Highest education level: Some college, no degree  Occupational History   Not on file  Tobacco Use   Smoking status: Never   Smokeless tobacco: Never  Vaping Use  Vaping status: Never Used  Substance and Sexual Activity   Alcohol use: No    Alcohol/week: 0.0 standard drinks of alcohol   Drug use: No   Sexual activity: Never  Other Topics Concern   Not on file  Social History Narrative   Widowed   Social Drivers of Health   Financial Resource Strain: Low Risk  (10/24/2023)   Overall  Financial Resource Strain (CARDIA)    Difficulty of Paying Living Expenses: Not hard at all  Food Insecurity: No Food Insecurity (01/24/2024)   Hunger Vital Sign    Worried About Running Out of Food in the Last Year: Never true    Ran Out of Food in the Last Year: Never true  Transportation Needs: No Transportation Needs (10/24/2023)   PRAPARE - Administrator, Civil Service (Medical): No    Lack of Transportation (Non-Medical): No  Physical Activity: Sufficiently Active (01/24/2024)   Exercise Vital Sign    Days of Exercise per Week: 3 days    Minutes of Exercise per Session: 150+ min  Stress: No Stress Concern Present (01/24/2024)   [employer] of Occupational Health - Occupational Stress Questionnaire    Feeling of Stress : Not at all  Social Connections: Moderately Isolated (01/24/2024)   Social Connection and Isolation Panel [NHANES]    Frequency of Communication with Friends and Family: More than three times a week    Frequency of Social Gatherings with Friends and Family: Twice a week    Attends Religious Services: More than 4 times per year    Active Member of [employer] or Organizations: No    Attends Banker Meetings: Never    Marital Status: Widowed     Review of Systems  Constitutional:  Negative for appetite change and unexpected weight change.  HENT:  Negative for congestion, sinus pressure and sore throat.   Eyes:  Negative for pain and visual disturbance.  Respiratory:  Negative for cough, chest tightness and shortness of breath.   Cardiovascular:  Negative for chest pain, palpitations and leg swelling.  Gastrointestinal:  Negative for abdominal pain, diarrhea, nausea and vomiting.  Genitourinary:  Negative for difficulty urinating and dysuria.  Musculoskeletal:  Negative for joint swelling and myalgias.  Skin:  Negative for color change and rash.  Neurological:  Negative for dizziness and headaches.  Hematological:  Negative for adenopathy.  Does not bruise/bleed easily.  Psychiatric/Behavioral:  Negative for agitation and dysphoric mood.        Objective:     BP 118/68   Pulse 60   Temp 98 F (36.7 C)   Resp 16   Ht 5\' 1"  (1.549 m)   Wt 137 lb 12.8 oz (62.5 kg)   SpO2 98%   BMI 26.04 kg/m  Wt Readings from Last 3 Encounters:  05/28/24 137 lb 12.8 oz (62.5 kg)  01/28/24 140 lb (63.5 kg)  10/28/23 137 lb 12.8 oz (62.5 kg)    Physical Exam Vitals reviewed.  Constitutional:      General: She is not in acute distress.    Appearance: Normal appearance. She is well-developed.  HENT:     Head: Normocephalic and atraumatic.     Right Ear: External ear normal.     Left Ear: External ear normal.     Mouth/Throat:     Pharynx: No oropharyngeal exudate or posterior oropharyngeal erythema.  Eyes:     General: No scleral icterus.       Right eye: No discharge.  Left eye: No discharge.     Conjunctiva/sclera: Conjunctivae normal.  Neck:     Thyroid : No thyromegaly.  Cardiovascular:     Rate and Rhythm: Normal rate and regular rhythm.  Pulmonary:     Effort: No tachypnea, accessory muscle usage or respiratory distress.     Breath sounds: Normal breath sounds. No decreased breath sounds or wheezing.  Chest:  Breasts:    Right: No inverted nipple, mass, nipple discharge or tenderness (no axillary adenopathy).     Left: No inverted nipple, mass, nipple discharge or tenderness (no axilarry adenopathy).  Abdominal:     General: Bowel sounds are normal.     Palpations: Abdomen is soft.     Tenderness: There is no abdominal tenderness.  Musculoskeletal:        General: No swelling or tenderness.     Cervical back: Neck supple.  Lymphadenopathy:     Cervical: No cervical adenopathy.  Skin:    Findings: No erythema or rash.  Neurological:     Mental Status: She is alert and oriented to person, place, and time.  Psychiatric:        Mood and Affect: Mood normal.        Behavior: Behavior normal.          Outpatient Encounter Medications as of 05/28/2024  Medication Sig   aspirin  81 MG tablet Take 81 mg by mouth daily.   benazepril  (LOTENSIN ) 20 MG tablet Take 1 tablet by mouth 2 (two) times daily.   calcitonin, salmon, (MIACALCIN /FORTICAL) 200 UNIT/ACT nasal spray ONE SPRAY ALTERNATING NOSTRILS EVERY OTHER DAY   Calcium -Phosphorus-Vitamin D  (CITRACAL +D3 PO) Take 1 tablet by mouth daily.   chlorzoxazone  (PARAFON ) 500 MG tablet TAKE 1 TABLET BY MOUTH EVERY DAY AT BEDTIME AS NEEDED. DO NOT TAKE WITH AMBIEN .   Cyanocobalamin  (B-12) 1000 MCG/ML KIT Inject 1,000 mcg as directed every 30 (thirty) days.    GLUCOSAMINE-CHONDROITIN PO Take 1 tablet by mouth 2 (two) times daily.    hydrochlorothiazide (HYDRODIURIL) 12.5 MG tablet Take 12.5 mg by mouth daily.   levothyroxine  (SYNTHROID ) 88 MCG tablet TAKE 1 TABLET BY MOUTH EVERY DAY   Mesalamine  (ASACOL ) 400 MG CPDR DR capsule Take 400 mg by mouth 2 (two) times daily.    pantoprazole  (PROTONIX ) 40 MG tablet Take 40 mg by mouth daily.   sotalol  (BETAPACE ) 80 MG tablet Take 0.5 mg by mouth daily.   Wheat Dextrin (BENEFIBER DRINK MIX) PACK Take 1 each by mouth daily.    zolpidem  (AMBIEN  CR) 6.25 MG CR tablet TAKE 1 TABLET (6.25 MG TOTAL) BY MOUTH AT BEDTIME AS NEEDED. FOR SLEEP   [DISCONTINUED] budesonide (ENTOCORT EC) 3 MG 24 hr capsule Take by mouth.   [DISCONTINUED] budesonide (ENTOCORT EC) 3 MG 24 hr capsule Take 3 mg by mouth every other day.   [EXPIRED] cyanocobalamin  (VITAMIN B12) injection 1,000 mcg    No facility-administered encounter medications on file as of 05/28/2024.     Lab Results  Component Value Date   WBC 4.8 08/27/2023   HGB 12.0 08/27/2023   HCT 36.6 08/27/2023   PLT 251.0 08/27/2023   GLUCOSE 96 05/26/2024   CHOL 156 05/26/2024   TRIG 112.0 05/26/2024   HDL 47.90 05/26/2024   LDLCALC 85 05/26/2024   ALT 13 05/26/2024   AST 19 05/26/2024   NA 136 05/26/2024   K 4.9 05/26/2024   CL 102 05/26/2024   CREATININE 1.06  05/26/2024   BUN 35 (H) 05/26/2024   CO2  26 05/26/2024   TSH 0.66 08/15/2023   INR 1.0 06/07/2020   HGBA1C 5.8 05/26/2024    US  Abdomen Complete Result Date: 11/21/2023 : PROCEDURE: ULTRASOUND ABDOMEN COMPLETE HISTORY: Patient is a 89 y/o Female with hepatitis. Cholecystectomy. COMPARISON: U/S abdomen 03/25/2023, 03/14/2022. TECHNIQUE: Two-dimensional grayscale, color and spectral Doppler ultrasound of the abdomen was performed. FINDINGS: The pancreas demonstrates a normal homogenous echotexture with the tail suboptimally visualized due to overlying bowel gas. The liver demonstrates mildly increased echotexture without intrahepatic biliary dilatation. No masses are visualized. The main portal vein demonstrates normal hepatopedal flow. The gallbladder is surgically absent. The common bile duct is dilated measuring 1.1 cm, often seen after cholecystectomy. The right kidney measures 9.0 cm in length. Renal cortical echotexture is mildly increased. There is no hydronephrosis. There are no stones. There is 1.7 cm superior pole cortex. The left kidney measures 9.2 cm in length. Renal cortical echotexture is mildly increased. There is no hydronephrosis. There are no stones. There are no cysts. The spleen measures 9.3 cm in length and demonstrates normal echotexture. There is no evidence of aneurysm within the visualized segments of the abdominal aorta. The visualized segments of the IVC are unremarkable. IMPRESSION: 1. Mildly increased hepatic echotexture, consistent with provided history of liver disease/hepatitis. 2. Surgically absent gallbladder with dilatation of the common bile duct, often seen after cholecystectomy. 3. Increased echogenicity of the bilateral renal cortices and a simple cyst, suggestive of medical renal disease. Thank you for allowing us  to assist in the care of this patient. Electronically Signed   By: Beula Brunswick M.D.   On: 11/21/2023 11:31       Assessment & Plan:  Health care  maintenance Assessment & Plan: Physical today 05/28/24.   Mammogram 09/17/23 - birads I.  Followed by GI.    Screening cholesterol level -     Lipid panel; Future  Primary hypertension Assessment & Plan: On benazepril  20mg  bid.  On sotalol .   Blood pressure doing well.  Follow metabolic panel. No changes today. Outside pressures as outlined.   Orders: -     Hepatic function panel; Future -     Basic metabolic panel with GFR; Future -     TSH; Future  Hyperglycemia Assessment & Plan: Low carb diet and exercise.  Follow met b and A1c.   Orders: -     Hemoglobin A1c; Future  Diarrhea, unspecified type Assessment & Plan: Followed by GI. Off budesonide. Taking delzicol  400mg  bid. Having trouble with constipation. Did not tolerate bid miralax. Badk to daily dosing. Staying hydrated. Continue fiber. Continue f/u with GI.   Orders: -     CBC with Differential/Platelet; Future  B12 deficiency Assessment & Plan: Continue B12 injections.    Orders: -     Cyanocobalamin   Abnormal liver function tests Assessment & Plan: Followed by GI.  Has been stable.  Follow liver function tests. Last abdominal ultrasound ok.    Anemia, unspecified type Assessment & Plan: Follow cbc.    Autoimmune hepatitis (HCC) Assessment & Plan: Followed by GI.  Follow liver function tests. Last abdominal ultrasound ok.    Barrett's esophagus with esophagitis Assessment & Plan: Upper symptoms controlled.  Continue protonix .    Stage 3a chronic kidney disease (HCC) Assessment & Plan: Avoid antiinflammatories.  Stay hydrated.  Follow metabolic panel.    Hypothyroidism, unspecified type Assessment & Plan: On thyroid  replacement. Follow tsh.    SVT (supraventricular tachycardia) (HCC) Assessment & Plan: No increased heart rate or palpitations  reported currently. Continues f/u with cardiology. Continue sotalol .    MGUS (monoclonal gammopathy of unknown significance) Assessment & Plan: Was  worked up by hematology.  Last check - no m spike.  Felt no further w/up warranted.        Dellar Fenton, MD

## 2024-05-31 ENCOUNTER — Encounter: Payer: Self-pay | Admitting: Internal Medicine

## 2024-05-31 NOTE — Assessment & Plan Note (Signed)
 Low-carb diet and exercise.  Follow met b and A1c.

## 2024-05-31 NOTE — Assessment & Plan Note (Signed)
 On benazepril  20mg  bid.  On sotalol .   Blood pressure doing well.  Follow metabolic panel. No changes today. Outside pressures as outlined.

## 2024-05-31 NOTE — Assessment & Plan Note (Signed)
 On thyroid replacement.  Follow tsh.

## 2024-05-31 NOTE — Assessment & Plan Note (Signed)
Avoid antiinflammatories.  Stay hydrated.  Follow metabolic panel.   

## 2024-05-31 NOTE — Assessment & Plan Note (Signed)
 Followed by GI.  Follow liver function tests. Last abdominal ultrasound ok.

## 2024-05-31 NOTE — Assessment & Plan Note (Signed)
Was worked up by hematology.  Last check - no m spike.  Felt no further w/up warranted.

## 2024-05-31 NOTE — Assessment & Plan Note (Signed)
 Upper symptoms controlled.  Continue protonix .

## 2024-05-31 NOTE — Assessment & Plan Note (Signed)
 Followed by GI.  Has been stable.  Follow liver function tests. Last abdominal ultrasound ok.

## 2024-05-31 NOTE — Assessment & Plan Note (Signed)
 Follow cbc.

## 2024-05-31 NOTE — Assessment & Plan Note (Signed)
 No increased heart rate or palpitations reported currently. Continues f/u with cardiology. Continue sotalol .

## 2024-05-31 NOTE — Assessment & Plan Note (Signed)
 Continue B12 injections.

## 2024-05-31 NOTE — Assessment & Plan Note (Signed)
 Followed by GI. Off budesonide. Taking delzicol  400mg  bid. Having trouble with constipation. Did not tolerate bid miralax. Badk to daily dosing. Staying hydrated. Continue fiber. Continue f/u with GI.

## 2024-06-03 DIAGNOSIS — R141 Gas pain: Secondary | ICD-10-CM | POA: Diagnosis not present

## 2024-06-03 DIAGNOSIS — R140 Abdominal distension (gaseous): Secondary | ICD-10-CM | POA: Diagnosis not present

## 2024-06-03 DIAGNOSIS — R109 Unspecified abdominal pain: Secondary | ICD-10-CM | POA: Diagnosis not present

## 2024-06-03 DIAGNOSIS — K5900 Constipation, unspecified: Secondary | ICD-10-CM | POA: Diagnosis not present

## 2024-06-29 ENCOUNTER — Ambulatory Visit (INDEPENDENT_AMBULATORY_CARE_PROVIDER_SITE_OTHER)

## 2024-06-29 DIAGNOSIS — E538 Deficiency of other specified B group vitamins: Secondary | ICD-10-CM | POA: Diagnosis not present

## 2024-06-29 MED ORDER — CYANOCOBALAMIN 1000 MCG/ML IJ SOLN
1000.0000 ug | Freq: Once | INTRAMUSCULAR | Status: AC
  Administered 2024-06-29: 1000 ug via INTRAMUSCULAR

## 2024-06-29 NOTE — Progress Notes (Signed)
 Pt presented for their vitamin B12 injection. Pt was identified through two identifiers. Pt tolerated shot well in their left deltoid.

## 2024-07-14 DIAGNOSIS — K754 Autoimmune hepatitis: Secondary | ICD-10-CM | POA: Diagnosis not present

## 2024-07-14 DIAGNOSIS — K52831 Collagenous colitis: Secondary | ICD-10-CM | POA: Diagnosis not present

## 2024-07-14 DIAGNOSIS — K5900 Constipation, unspecified: Secondary | ICD-10-CM | POA: Diagnosis not present

## 2024-07-17 ENCOUNTER — Ambulatory Visit (INDEPENDENT_AMBULATORY_CARE_PROVIDER_SITE_OTHER): Payer: Medicare Other | Admitting: *Deleted

## 2024-07-17 VITALS — Ht 61.0 in | Wt 135.0 lb

## 2024-07-17 DIAGNOSIS — Z0000 Encounter for general adult medical examination without abnormal findings: Secondary | ICD-10-CM | POA: Diagnosis not present

## 2024-07-17 NOTE — Patient Instructions (Signed)
 Ms. Randle , Thank you for taking time out of your busy schedule to complete your Annual Wellness Visit with me. I enjoyed our conversation and look forward to speaking with you again next year. I, as well as your care team,  appreciate your ongoing commitment to your health goals. Please review the following plan we discussed and let me know if I can assist you in the future. Your Game plan/ To Do List    Referrals: If you haven't heard from the office you've been referred to, please reach out to them at the phone provided.  Remember to update your flu and covid vaccines annually and consider updating your Tetanus (Tdap) vaccine.  Follow up Visits: Next Medicare AWV with our clinical staff: 07/20/25 @ 1:40   Have you seen your provider in the last 6 months (3 months if uncontrolled diabetes)? Yes Next Office Visit with your provider: 09/28/24  Clinician Recommendations:  Aim for 30 minutes of exercise or brisk walking, 6-8 glasses of water, and 5 servings of fruits and vegetables each day.       This is a list of the screening recommended for you and due dates:  Health Maintenance  Topic Date Due   COVID-19 Vaccine (6 - Pfizer risk 2024-25 season) 04/21/2024   Flu Shot  07/31/2024   Mammogram  09/16/2024   Medicare Annual Wellness Visit  07/17/2025   Pneumococcal Vaccine for age over 22  Completed   DEXA scan (bone density measurement)  Completed   Zoster (Shingles) Vaccine  Completed   Hepatitis B Vaccine  Aged Out   HPV Vaccine  Aged Out   Meningitis B Vaccine  Aged Out   DTaP/Tdap/Td vaccine  Discontinued    Advanced directives: (ACP Link)Information on Advanced Care Planning can be found at [place]  Secretary of [facility] Advance Health Care Directives Advance Health Care Directives. [email or web address]  Advance Care Planning is important because it:  [x]  Makes sure you receive the medical care that is consistent with your values, goals, and preferences  [x]  It provides guidance to  your family and loved ones and reduces their decisional burden about whether or not they are making the right decisions based on your wishes.  Follow the link provided in your after visit summary or read over the paperwork we have mailed to you to help you started getting your Advance Directives in place. If you need assistance in completing these, please reach out to us  so that we can help you!

## 2024-07-17 NOTE — Progress Notes (Signed)
 Subjective:   Lori [name] is a 89 y.o. who presents for a Medicare Wellness preventive visit.  As a reminder, Annual Wellness Visits don't include a physical exam, and some assessments may be limited, especially if this visit is performed virtually. We may recommend an in-person follow-up visit with your provider if needed.  Visit Complete: Virtual I connected with  Lori [name] on 07/17/24 by a audio enabled telemedicine application and verified that I am speaking with the correct person using two identifiers.  Patient Location: Home  Provider Location: Home Office  I discussed the limitations of evaluation and management by telemedicine. The patient expressed understanding and agreed to proceed.  Vital Signs: Because this visit was a virtual/telehealth visit, some criteria may be missing or patient reported. Any vitals not documented were not able to be obtained and vitals that have been documented are patient reported.  VideoDeclined- This patient declined Librarian, academic. Therefore the visit was completed with audio only.  Persons Participating in Visit: Patient.  AWV Questionnaire: Yes: Patient Medicare AWV questionnaire was completed by the patient on 07/13/24; I have confirmed that all information answered by patient is correct and no changes since this date.  Cardiac Risk Factors include: advanced age (>59men, >34 women);hypertension     Objective:    Today's Vitals   07/17/24 1012  Weight: 135 lb (61.2 kg)  Height: 5' 1 (1.549 m)   Body mass index is 25.51 kg/m.     07/17/2024   10:29 AM 07/16/2023    9:20 AM 10/04/2022    4:40 PM 06/19/2022   11:14 AM 10/07/2021    6:20 PM 10/05/2021    7:47 PM 09/13/2021    7:14 AM  Advanced Directives  Does Patient Have a Medical Advance Directive? No No No No No No Yes  Type of Tax inspector;Living will  Does patient want to make changes to  medical advance directive?       No - Guardian declined  Copy of Healthcare Power of Attorney in Chart?       No - copy requested  Would patient like information on creating a medical advance directive? No - Patient declined  No - Patient declined No - Patient declined No - Patient declined No - Patient declined     Current Medications (verified) Outpatient Encounter Medications as of 07/17/2024  Medication Sig   aspirin  81 MG tablet Take 81 mg by mouth daily.   benazepril  (LOTENSIN ) 20 MG tablet Take 1 tablet by mouth 2 (two) times daily.   calcitonin, salmon, (MIACALCIN /FORTICAL) 200 UNIT/ACT nasal spray ONE SPRAY ALTERNATING NOSTRILS EVERY OTHER DAY   Calcium -Phosphorus-Vitamin D  (CITRACAL +D3 PO) Take 1 tablet by mouth daily.   chlorzoxazone  (PARAFON ) 500 MG tablet TAKE 1 TABLET BY MOUTH EVERY DAY AT BEDTIME AS NEEDED. DO NOT TAKE WITH AMBIEN .   Cyanocobalamin  (B-12) 1000 MCG/ML KIT Inject 1,000 mcg as directed every 30 (thirty) days.    GLUCOSAMINE-CHONDROITIN PO Take 1 tablet by mouth 2 (two) times daily.    hydrochlorothiazide (HYDRODIURIL) 12.5 MG tablet Take 12.5 mg by mouth daily.   levothyroxine  (SYNTHROID ) 88 MCG tablet TAKE 1 TABLET BY MOUTH EVERY DAY   Mesalamine  (ASACOL ) 400 MG CPDR DR capsule Take 400 mg by mouth 2 (two) times daily.    pantoprazole  (PROTONIX ) 40 MG tablet Take 40 mg by mouth daily. (Patient taking differently: Take 40 mg by mouth daily as  needed.)   polyethylene glycol powder (GLYCOLAX/MIRALAX) 17 GM/SCOOP powder Take by mouth daily as needed.   sotalol  (BETAPACE ) 80 MG tablet Take 0.5 mg by mouth daily.   zolpidem  (AMBIEN  CR) 6.25 MG CR tablet TAKE 1 TABLET (6.25 MG TOTAL) BY MOUTH AT BEDTIME AS NEEDED. FOR SLEEP   Wheat Dextrin (BENEFIBER DRINK MIX) PACK Take 1 each by mouth daily.  (Patient not taking: Reported on 07/17/2024)   No facility-administered encounter medications on file as of 07/17/2024.    Allergies (verified) Penicillins, Daypro [oxaprozin],  and Lodine [etodolac]   History: Past Medical History:  Diagnosis Date   Anemia    iron deficient   Arthritis    Autoimmune hepatitis (HCC) 05/10/2016   Hep C   B12 deficiency    Barrett esophagus 01/2016   Bradycardia    Chronic kidney disease    stage III   Diverticulosis    GERD (gastroesophageal reflux disease)    Hypertension    Hypothyroidism    Premature beats 01/28/2024   Ulcer disease    Past Surgical History:  Procedure Laterality Date   CATARACT EXTRACTION W/PHACO Left 08/30/2021   Procedure: CATARACT EXTRACTION PHACO AND INTRAOCULAR LENS PLACEMENT (IOC) LEFT 8.21 01:00.3;  Surgeon: Mittie Gaskin, MD;  Location: [facility];  Service: Ophthalmology;  Laterality: Left;   CATARACT EXTRACTION W/PHACO Right 09/13/2021   Procedure: CATARACT EXTRACTION PHACO AND INTRAOCULAR LENS PLACEMENT (IOC) RIGHT;  Surgeon: Mittie Gaskin, MD;  Location: [facility];  Service: Ophthalmology;  Laterality: Right;  8.11 00:59.8   CHOLECYSTECTOMY N/A 06/25/2018   Procedure: LAPAROSCOPIC CHOLECYSTECTOMY WITH INTRAOPERATIVE CHOLANGIOGRAM;  Surgeon: Dessa Reyes ORN, MD;  Location: [facility] OR[facility];  Service: General;  Laterality: N/A;   ESOPHAGOGASTRODUODENOSCOPY (EGD) WITH PROPOFOL  N/A 01/30/2016   Procedure: ESOPHAGOGASTRODUODENOSCOPY (EGD) WITH PROPOFOL ;  Surgeon: Gladis RAYMOND Mariner, MD;  Location: [facility];  Service: Endoscopy;  Laterality: N/A;   ESOPHAGOGASTRODUODENOSCOPY (EGD) WITH PROPOFOL  N/A 01/14/2018   Procedure: ESOPHAGOGASTRODUODENOSCOPY (EGD) WITH PROPOFOL ;  Surgeon: Mariner Gladis RAYMOND, MD;  Location: [facility];  Service: Endoscopy;  Laterality: N/A;   ESOPHAGOGASTRODUODENOSCOPY (EGD) WITH PROPOFOL  N/A 05/20/2018   Procedure: ESOPHAGOGASTRODUODENOSCOPY (EGD) WITH PROPOFOL ;  Surgeon: Mariner Gladis RAYMOND, MD;  Location: [facility];  Service: Endoscopy;  Laterality: N/A;   ESOPHAGOGASTRODUODENOSCOPY (EGD) WITH PROPOFOL  N/A 01/13/2019   Procedure:  ESOPHAGOGASTRODUODENOSCOPY (EGD) WITH PROPOFOL ;  Surgeon: Mariner Gladis RAYMOND, MD;  Location: [facility] END[facility];  Service: Endoscopy;  Laterality: N/A;   HERNIA REPAIR     lap hiatal hernia repair  07/08/08   LEFT OOPHORECTOMY  1993   benign ovarian cyst   TUBAL LIGATION  1981   Family History  Problem Relation Age of Onset   Stroke Mother    Breast cancer Neg Hx    Colon cancer Neg Hx    Social History   Socioeconomic History   Marital status: Widowed    Spouse name: Not on file   Number of children: 0   Years of education: Not on file   Highest education level: Some college, no degree  Occupational History   Not on file  Tobacco Use   Smoking status: Never   Smokeless tobacco: Never  Vaping Use   Vaping status: Never Used  Substance and Sexual Activity   Alcohol use: No    Alcohol/week: 0.0 standard drinks of alcohol   Drug use: No   Sexual activity: Never  Other Topics Concern   Not on file  Social History Narrative   Widowed   Social  Drivers of Health   Financial Resource Strain: Low Risk  (07/13/2024)   Overall Financial Resource Strain (CARDIA)    Difficulty of Paying Living Expenses: Not hard at all  Food Insecurity: No Food Insecurity (07/13/2024)   Hunger Vital Sign    Worried About Running Out of Food in the Last Year: Never true    Ran Out of Food in the Last Year: Never true  Transportation Needs: No Transportation Needs (07/13/2024)   PRAPARE - Administrator, Civil Service (Medical): No    Lack of Transportation (Non-Medical): No  Physical Activity: Sufficiently Active (07/13/2024)   Exercise Vital Sign    Days of Exercise per Week: 4 days    Minutes of Exercise per Session: 150+ min  Stress: No Stress Concern Present (07/13/2024)   [employer] of Occupational Health - Occupational Stress Questionnaire    Feeling of Stress: Only a little  Social Connections: Moderately Isolated (07/13/2024)   Social Connection and Isolation Panel     Frequency of Communication with Friends and Family: More than three times a week    Frequency of Social Gatherings with Friends and Family: Twice a week    Attends Religious Services: More than 4 times per year    Active Member of [employer] or Organizations: No    Attends Banker Meetings: Never    Marital Status: Widowed    Tobacco Counseling Counseling given: Not Answered    Clinical Intake:  Pre-visit preparation completed: Yes  Pain : No/denies pain     BMI - recorded: 25.51 Nutritional Status: BMI 25 -29 Overweight Nutritional Risks: Nausea/ vomitting/ diarrhea (diarrhea is being treated) Diabetes: No  Lab Results  Component Value Date   HGBA1C 5.8 05/26/2024   HGBA1C 6.1 01/24/2024   HGBA1C 5.7 08/15/2023     How often do you need to have someone help you when you read instructions, pamphlets, or other written materials from your doctor or pharmacy?: 1 - Never  Interpreter Needed?: No  Information entered by :: R. Kira Hartl LPN   Activities of Daily Living     07/13/2024    1:22 PM  In your present state of health, do you have any difficulty performing the following activities:  Hearing? 1  Comment wears aids  Vision? 0  Difficulty concentrating or making decisions? 0  Walking or climbing stairs? 1  Dressing or bathing? 0  Doing errands, shopping? 0  Preparing Food and eating ? N  Using the Toilet? N  In the past six months, have you accidently leaked urine? N  Do you have problems with loss of bowel control? N  Managing your Medications? N  Managing your Finances? N  Housekeeping or managing your Housekeeping? N    Patient Care Team: Glendia Shad, MD as PCP - General (Internal Medicine) Jane Delmar Pike, NP as Nurse Practitioner (Gastroenterology)  I have updated your Care Teams any recent Medical Services you may have received from other providers in the past year.     Assessment:   This is a routine wellness examination for  [place].  Hearing/Vision screen Hearing Screening - Comments:: Wears aids Vision Screening - Comments:: No correction   Goals Addressed             This Visit's Progress    Patient Stated       Would like to get her knee where she can walk up steps better       Depression Screen  07/17/2024   10:22 AM 08/27/2023    3:37 PM 07/16/2023    9:15 AM 04/19/2023   10:26 AM 12/18/2022    9:02 AM 08/28/2022    2:08 PM 06/19/2022   11:11 AM  PHQ 2/9 Scores  PHQ - 2 Score 0 0 0 0 0 0 0  PHQ- 9 Score 2 1 1 4        Fall Risk     07/13/2024    1:22 PM 08/27/2023    3:37 PM 07/16/2023    9:23 AM 07/14/2023    1:30 PM 04/19/2023    9:56 AM  Fall Risk   Falls in the past year? 0 0 0 0 0  Number falls in past yr: 0 0 0  0  Injury with Fall? 0 0 0  0  Risk for fall due to : No Fall Risks No Fall Risks History of fall(s);Impaired balance/gait  No Fall Risks  Follow up Falls evaluation completed;Falls prevention discussed Falls evaluation completed Falls prevention discussed  Falls evaluation completed    MEDICARE RISK AT HOME:  Medicare Risk at Home Any stairs in or around the home?: (Patient-Rptd) Yes If so, are there any without handrails?: (Patient-Rptd) No Home free of loose throw rugs in walkways, pet beds, electrical cords, etc?: (Patient-Rptd) Yes Adequate lighting in your home to reduce risk of falls?: (Patient-Rptd) Yes Life alert?: (Patient-Rptd) No Use of a cane, walker or w/c?: Yes (cane in the yard) Grab bars in the bathroom?: (Patient-Rptd) Yes Shower chair or bench in shower?: (Patient-Rptd) Yes Elevated toilet seat or a handicapped toilet?: (Patient-Rptd) Yes  TIMED UP AND GO:  Was the test performed?  No  Cognitive Function: 6CIT completed    03/26/2017   10:49 AM 03/26/2016    1:25 PM  MMSE - Mini Mental State Exam  Orientation to time 5  5   Orientation to Place 5  5   Registration 3  3   Attention/ Calculation 5  5   Recall 3  3   Language- name 2  objects 2  2   Language- repeat 1 1  Language- follow 3 step command 3  3   Language- read & follow direction 1  1   Write a sentence 1  1   Copy design 1  1   Total score 30  30      Data saved with a previous flowsheet row definition        07/17/2024   10:29 AM 07/16/2023    9:20 AM 06/16/2021   10:30 AM 06/15/2020    9:50 AM 06/01/2019   10:45 AM  6CIT Screen  What Year? 0 points 0 points 0 points 0 points 0 points  What month? 0 points 0 points 0 points 0 points 0 points  What time? 0 points 0 points 0 points 0 points 0 points  Count back from 20 0 points 0 points  0 points 0 points  Months in reverse 0 points 0 points 0 points 0 points 0 points  Repeat phrase 0 points 0 points  2 points 0 points  Total Score 0 points 0 points  2 points 0 points    Immunizations Immunization History  Administered Date(s) Administered   Fluad Quad(high Dose 65+) 09/08/2019, 09/20/2020, 09/22/2021, 10/12/2022   Fluad Trivalent(High Dose 65+) 09/23/2023   Influenza, High Dose Seasonal PF 10/01/2017, 09/12/2018   Influenza,inj,Quad PF,6+ Mos 08/31/2014, 09/07/2015, 08/21/2016   Influenza-Unspecified 09/11/2012, 10/14/2013, 08/31/2014, 09/07/2015, 08/21/2016  Moderna Covid-19 Fall Seasonal Vaccine 41yrs & older 10/22/2023   PFIZER(Purple Top)SARS-COV-2 Vaccination 01/25/2020, 02/15/2020, 12/27/2020   Pfizer Covid-19 Vaccine Bivalent Booster 5y-11y 04/09/2022   Pneumococcal Conjugate-13 03/08/2014, 03/02/2015   Pneumococcal Polysaccharide-23 07/22/2008   Zoster Recombinant(Shingrix) 05/14/2018, 07/15/2018   Zoster, Live 12/31/2008    Screening Tests Health Maintenance  Topic Date Due   COVID-19 Vaccine (6 - Pfizer risk 2024-25 season) 04/21/2024   INFLUENZA VACCINE  07/31/2024   MAMMOGRAM  09/16/2024   Medicare Annual Wellness (AWV)  09/16/2024   Pneumococcal Vaccine: 50+ Years  Completed   DEXA SCAN  Completed   Zoster Vaccines- Shingrix  Completed   Hepatitis B Vaccines  Aged Out    HPV VACCINES  Aged Out   Meningococcal B Vaccine  Aged Out   DTaP/Tdap/Td  Discontinued    Health Maintenance  Health Maintenance Due  Topic Date Due   COVID-19 Vaccine (6 - Pfizer risk 2024-25 season) 04/21/2024   Health Maintenance Items Addressed: Discussed the need to update flu and covid vaccines annually and to consider updating her tetanus (Tdap) vaccine  Additional Screening:  Vision Screening: Recommended annual ophthalmology exams for early detection of glaucoma and other disorders of the eye.up to date  [facility] Would you like a referral to an eye doctor? No    Dental Screening: Recommended annual dental exams for proper oral hygiene  Community Resource Referral / Chronic Care Management: CRR required this visit?  No   CCM required this visit?  No   Plan:    I have personally reviewed and noted the following in the patient's chart:   Medical and social history Use of alcohol, tobacco or illicit drugs  Current medications and supplements including opioid prescriptions. Patient is not currently taking opioid prescriptions. Functional ability and status Nutritional status Physical activity Advanced directives List of other physicians Hospitalizations, surgeries, and ER visits in previous 12 months Vitals Screenings to include cognitive, depression, and falls Referrals and appointments  In addition, I have reviewed and discussed with patient certain preventive protocols, quality metrics, and best practice recommendations. A written personalized care plan for preventive services as well as general preventive health recommendations were provided to patient.   Angeline Fredericks, LPN   [id number]   After Visit Summary: (MyChart) Due to this being a telephonic visit, the after visit summary with patients personalized plan was offered to patient via MyChart   Notes: Nothing significant to report at this time.

## 2024-07-23 ENCOUNTER — Other Ambulatory Visit: Payer: Self-pay | Admitting: Internal Medicine

## 2024-07-29 ENCOUNTER — Ambulatory Visit (INDEPENDENT_AMBULATORY_CARE_PROVIDER_SITE_OTHER)

## 2024-07-29 DIAGNOSIS — E538 Deficiency of other specified B group vitamins: Secondary | ICD-10-CM

## 2024-07-29 MED ORDER — CYANOCOBALAMIN 1000 MCG/ML IJ SOLN
1000.0000 ug | Freq: Once | INTRAMUSCULAR | Status: AC
  Administered 2024-07-29: 1000 ug via INTRAMUSCULAR

## 2024-07-29 NOTE — Progress Notes (Signed)
 Patient was administered a B12 injection into her right deltoid. Patient tolerated the B12 injection well.

## 2024-08-25 ENCOUNTER — Other Ambulatory Visit: Payer: Self-pay | Admitting: Internal Medicine

## 2024-08-28 ENCOUNTER — Ambulatory Visit

## 2024-08-28 DIAGNOSIS — E538 Deficiency of other specified B group vitamins: Secondary | ICD-10-CM

## 2024-08-28 MED ORDER — CYANOCOBALAMIN 1000 MCG/ML IJ SOLN
1000.0000 ug | Freq: Once | INTRAMUSCULAR | Status: AC
  Administered 2024-08-28: 1000 ug via INTRAMUSCULAR

## 2024-08-28 NOTE — Progress Notes (Signed)
Pt received B12 injection in Left deltoid. Pt tolerated it well with no complaints or concerns.

## 2024-08-28 NOTE — Progress Notes (Deleted)
Pt received B12 injection in Left deltoid. Pt tolerated it well with no complaints or concerns.

## 2024-09-15 ENCOUNTER — Telehealth: Payer: Self-pay

## 2024-09-15 NOTE — Telephone Encounter (Signed)
 I left a voicemail on patient's home phone asking her to please call us  back to reschedule her 09/28/2024 appointment with Dr. Allena Hamilton, as she will be out of the office.  I let patient know that we will also need to reschedule her lab visit on 09/24/2024, as this should be two days prior to her appointment with Dr. Hamilton.  I called patient's cell phone and left a message asking her to please call us .  I sent a message to patient via MyChart.  E2C2 - when patient calls back, please transfer her to our office for rescheduling.

## 2024-09-24 ENCOUNTER — Other Ambulatory Visit

## 2024-09-28 ENCOUNTER — Ambulatory Visit (INDEPENDENT_AMBULATORY_CARE_PROVIDER_SITE_OTHER)

## 2024-09-28 ENCOUNTER — Ambulatory Visit: Admitting: Internal Medicine

## 2024-09-28 DIAGNOSIS — E538 Deficiency of other specified B group vitamins: Secondary | ICD-10-CM

## 2024-09-28 MED ORDER — CYANOCOBALAMIN 1000 MCG/ML IJ SOLN
1000.0000 ug | Freq: Once | INTRAMUSCULAR | Status: AC
  Administered 2024-09-28: 1000 ug via INTRAMUSCULAR

## 2024-09-28 NOTE — Progress Notes (Signed)
 Pt presented for their vitamin B12 injection. Pt was identified through two identifiers. Pt tolerated shot well in their right deltoid.

## 2024-10-12 ENCOUNTER — Other Ambulatory Visit (INDEPENDENT_AMBULATORY_CARE_PROVIDER_SITE_OTHER)

## 2024-10-12 ENCOUNTER — Ambulatory Visit: Payer: Self-pay | Admitting: Internal Medicine

## 2024-10-12 ENCOUNTER — Other Ambulatory Visit

## 2024-10-12 DIAGNOSIS — R739 Hyperglycemia, unspecified: Secondary | ICD-10-CM

## 2024-10-12 DIAGNOSIS — Z13220 Encounter for screening for lipoid disorders: Secondary | ICD-10-CM

## 2024-10-12 DIAGNOSIS — I10 Essential (primary) hypertension: Secondary | ICD-10-CM

## 2024-10-12 DIAGNOSIS — R197 Diarrhea, unspecified: Secondary | ICD-10-CM | POA: Diagnosis not present

## 2024-10-12 LAB — CBC WITH DIFFERENTIAL/PLATELET
Basophils Absolute: 0.1 K/uL (ref 0.0–0.1)
Basophils Relative: 1.4 % (ref 0.0–3.0)
Eosinophils Absolute: 0.1 K/uL (ref 0.0–0.7)
Eosinophils Relative: 1.7 % (ref 0.0–5.0)
HCT: 36.4 % (ref 36.0–46.0)
Hemoglobin: 12 g/dL (ref 12.0–15.0)
Lymphocytes Relative: 21.9 % (ref 12.0–46.0)
Lymphs Abs: 1.3 K/uL (ref 0.7–4.0)
MCHC: 33.1 g/dL (ref 30.0–36.0)
MCV: 90.4 fl (ref 78.0–100.0)
Monocytes Absolute: 0.6 K/uL (ref 0.1–1.0)
Monocytes Relative: 10.8 % (ref 3.0–12.0)
Neutro Abs: 3.8 K/uL (ref 1.4–7.7)
Neutrophils Relative %: 64.2 % (ref 43.0–77.0)
Platelets: 306 K/uL (ref 150.0–400.0)
RBC: 4.02 Mil/uL (ref 3.87–5.11)
RDW: 13.2 % (ref 11.5–15.5)
WBC: 6 K/uL (ref 4.0–10.5)

## 2024-10-12 LAB — BASIC METABOLIC PANEL WITH GFR
BUN: 22 mg/dL (ref 6–23)
CO2: 21 meq/L (ref 19–32)
Calcium: 9.4 mg/dL (ref 8.4–10.5)
Chloride: 103 meq/L (ref 96–112)
Creatinine, Ser: 1.14 mg/dL (ref 0.40–1.20)
GFR: 43.13 mL/min — ABNORMAL LOW (ref >60.00)
Glucose, Bld: 107 mg/dL — ABNORMAL HIGH (ref 70–99)
Potassium: 4.1 meq/L (ref 3.5–5.1)
Sodium: 135 meq/L (ref 135–145)

## 2024-10-12 LAB — HEMOGLOBIN A1C: Hgb A1c MFr Bld: 5.9 % (ref 4.6–6.5)

## 2024-10-12 LAB — TSH: TSH: 1.63 u[IU]/mL (ref 0.35–5.50)

## 2024-10-12 LAB — LIPID PANEL
Cholesterol: 143 mg/dL (ref 0–200)
HDL: 45.9 mg/dL (ref >39.00)
LDL Cholesterol: 75 mg/dL (ref 0–99)
NonHDL: 97.32
Total CHOL/HDL Ratio: 3
Triglycerides: 111 mg/dL (ref 0.0–149.0)
VLDL: 22.2 mg/dL (ref 0.0–40.0)

## 2024-10-12 LAB — HEPATIC FUNCTION PANEL
ALT: 7 U/L (ref 0–35)
AST: 12 U/L (ref 0–37)
Albumin: 4.1 g/dL (ref 3.5–5.2)
Alkaline Phosphatase: 49 U/L (ref 39–117)
Bilirubin, Direct: 0.2 mg/dL (ref 0.0–0.3)
Total Bilirubin: 1 mg/dL (ref 0.2–1.2)
Total Protein: 6.8 g/dL (ref 6.0–8.3)

## 2024-10-14 ENCOUNTER — Ambulatory Visit: Admitting: Internal Medicine

## 2024-10-14 ENCOUNTER — Encounter: Payer: Self-pay | Admitting: Internal Medicine

## 2024-10-14 ENCOUNTER — Ambulatory Visit: Payer: Self-pay | Admitting: Internal Medicine

## 2024-10-14 ENCOUNTER — Ambulatory Visit
Admission: RE | Admit: 2024-10-14 | Discharge: 2024-10-14 | Disposition: A | Discharge status: Home / Self Care | Attending: Internal Medicine | Admitting: Internal Medicine

## 2024-10-14 ENCOUNTER — Ambulatory Visit
Admission: RE | Admit: 2024-10-14 | Discharge: 2024-10-14 | Disposition: A | Discharge status: Home / Self Care | Source: Ambulatory Visit | Attending: Internal Medicine | Admitting: Internal Medicine

## 2024-10-14 VITALS — BP 138/76 | HR 64 | Temp 97.7°F | Ht 61.0 in | Wt 140.8 lb

## 2024-10-14 DIAGNOSIS — K2270 Barrett's esophagus without dysplasia: Secondary | ICD-10-CM | POA: Diagnosis not present

## 2024-10-14 DIAGNOSIS — R739 Hyperglycemia, unspecified: Secondary | ICD-10-CM | POA: Diagnosis not present

## 2024-10-14 DIAGNOSIS — M25539 Pain in unspecified wrist: Secondary | ICD-10-CM | POA: Insufficient documentation

## 2024-10-14 DIAGNOSIS — D472 Monoclonal gammopathy: Secondary | ICD-10-CM

## 2024-10-14 DIAGNOSIS — R197 Diarrhea, unspecified: Secondary | ICD-10-CM | POA: Diagnosis not present

## 2024-10-14 DIAGNOSIS — I471 Supraventricular tachycardia, unspecified: Secondary | ICD-10-CM

## 2024-10-14 DIAGNOSIS — E538 Deficiency of other specified B group vitamins: Secondary | ICD-10-CM | POA: Diagnosis not present

## 2024-10-14 DIAGNOSIS — N1831 Chronic kidney disease, stage 3a: Secondary | ICD-10-CM | POA: Diagnosis not present

## 2024-10-14 DIAGNOSIS — M25531 Pain in right wrist: Secondary | ICD-10-CM | POA: Insufficient documentation

## 2024-10-14 DIAGNOSIS — I10 Essential (primary) hypertension: Secondary | ICD-10-CM | POA: Diagnosis not present

## 2024-10-14 DIAGNOSIS — K754 Autoimmune hepatitis: Secondary | ICD-10-CM

## 2024-10-14 DIAGNOSIS — W19XXXA Unspecified fall, initial encounter: Secondary | ICD-10-CM | POA: Diagnosis not present

## 2024-10-14 DIAGNOSIS — E039 Hypothyroidism, unspecified: Secondary | ICD-10-CM | POA: Diagnosis not present

## 2024-10-14 DIAGNOSIS — M1811 Unilateral primary osteoarthritis of first carpometacarpal joint, right hand: Secondary | ICD-10-CM | POA: Diagnosis not present

## 2024-10-14 DIAGNOSIS — Y92481 Parking lot as the place of occurrence of the external cause: Secondary | ICD-10-CM | POA: Diagnosis not present

## 2024-10-14 DIAGNOSIS — G479 Sleep disorder, unspecified: Secondary | ICD-10-CM

## 2024-10-14 DIAGNOSIS — S52501A Unspecified fracture of the lower end of right radius, initial encounter for closed fracture: Secondary | ICD-10-CM | POA: Diagnosis not present

## 2024-10-14 DIAGNOSIS — S52591A Other fractures of lower end of right radius, initial encounter for closed fracture: Secondary | ICD-10-CM | POA: Diagnosis not present

## 2024-10-14 DIAGNOSIS — Z13220 Encounter for screening for lipoid disorders: Secondary | ICD-10-CM

## 2024-10-14 DIAGNOSIS — K209 Esophagitis, unspecified without bleeding: Secondary | ICD-10-CM

## 2024-10-14 DIAGNOSIS — Z23 Encounter for immunization: Secondary | ICD-10-CM | POA: Diagnosis not present

## 2024-10-14 MED ORDER — ZOLPIDEM TARTRATE ER 6.25 MG PO TBCR: 6.2500 mg | EXTENDED_RELEASE_TABLET | Freq: Every evening | ORAL | 2 refills | Status: AC | PRN

## 2024-10-14 NOTE — Progress Notes (Signed)
 Subjective:    Patient ID: Lori [name], female    DOB: 12-Aug-1934, 89 y.o.   MRN: [id number]  Patient here for  Chief Complaint  Patient presents with   Hyperlipidemia   Hypothyroidism   Fall    Patient had a Fall on 10/02/24 in parking lot of church. Patient hit her face and hand. Patient says she is doing a little better, but has pain with movement in her hand.     HPI Here for a scheduled follow up - follow up regarding hypertension, bradycardia/SVT, anemia and hypothyroidism. Continues on sotalol  and benazepril . Seeing cardiology. Last f/u 04/13/24 - stable. Had f/u with GI 07/14/24 - f/u colagenous colitis. On bid delzicol . Taking daily dose of miralax. Ultrasound due in November. Still with some bowel flares. Continues f/u with GI. She did have a fall 10/02/24. [place] - hit her vace - chipped her tooth and had increased swelling - lip/chin. Has been able to eat soup. Just started eating more solid food. She did also hurt her right wrist. Persistent pain. No headache. No dizziness. No chest pain or sob reported.    Past Medical History:  Diagnosis Date   Anemia    iron deficient   Arthritis    Autoimmune hepatitis (HCC) 05/10/2016   Hep C   B12 deficiency    Barrett esophagus 01/2016   Bradycardia    Chronic kidney disease    stage III   Diverticulosis    GERD (gastroesophageal reflux disease)    Hypertension    Hypothyroidism    Premature beats 01/28/2024   Ulcer disease    Past Surgical History:  Procedure Laterality Date   CATARACT EXTRACTION W/PHACO Left 08/30/2021   Procedure: CATARACT EXTRACTION PHACO AND INTRAOCULAR LENS PLACEMENT (IOC) LEFT 8.21 01:00.3;  Surgeon: Mittie Gaskin, MD;  Location: [facility];  Service: Ophthalmology;  Laterality: Left;   CATARACT EXTRACTION W/PHACO Right 09/13/2021   Procedure: CATARACT EXTRACTION PHACO AND INTRAOCULAR LENS PLACEMENT (IOC) RIGHT;  Surgeon: Mittie Gaskin, MD;  Location: [facility];   Service: Ophthalmology;  Laterality: Right;  8.11 00:59.8   CHOLECYSTECTOMY N/A 06/25/2018   Procedure: LAPAROSCOPIC CHOLECYSTECTOMY WITH INTRAOPERATIVE CHOLANGIOGRAM;  Surgeon: Dessa Reyes ORN, MD;  Location: [facility];  Service: General;  Laterality: N/A;   ESOPHAGOGASTRODUODENOSCOPY (EGD) WITH PROPOFOL  N/A 01/30/2016   Procedure: ESOPHAGOGASTRODUODENOSCOPY (EGD) WITH PROPOFOL ;  Surgeon: Gladis RAYMOND Mariner, MD;  Location: [facility];  Service: Endoscopy;  Laterality: N/A;   ESOPHAGOGASTRODUODENOSCOPY (EGD) WITH PROPOFOL  N/A 01/14/2018   Procedure: ESOPHAGOGASTRODUODENOSCOPY (EGD) WITH PROPOFOL ;  Surgeon: Mariner Gladis RAYMOND, MD;  Location: [facility] END[facility];  Service: Endoscopy;  Laterality: N/A;   ESOPHAGOGASTRODUODENOSCOPY (EGD) WITH PROPOFOL  N/A 05/20/2018   Procedure: ESOPHAGOGASTRODUODENOSCOPY (EGD) WITH PROPOFOL ;  Surgeon: Mariner Gladis RAYMOND, MD;  Location: [facility];  Service: Endoscopy;  Laterality: N/A;   ESOPHAGOGASTRODUODENOSCOPY (EGD) WITH PROPOFOL  N/A 01/13/2019   Procedure: ESOPHAGOGASTRODUODENOSCOPY (EGD) WITH PROPOFOL ;  Surgeon: Mariner Gladis RAYMOND, MD;  Location: [facility] END[facility]COPY;  Service: Endoscopy;  Laterality: N/A;   HERNIA REPAIR     lap hiatal hernia repair  07/08/08   LEFT OOPHORECTOMY  1993   benign ovarian cyst   TUBAL LIGATION  1981   Family History  Problem Relation Age of Onset   Stroke Mother    Breast cancer Neg Hx    Colon cancer Neg Hx    Social History   Socioeconomic History   Marital status: Widowed    Spouse name: Not on file   Number of children:  0   Years of education: Not on file   Highest education level: Associate degree: occupational, technical, or vocational program  Occupational History   Not on file  Tobacco Use   Smoking status: Never   Smokeless tobacco: Never  Vaping Use   Vaping status: Never Used  Substance and Sexual Activity   Alcohol use: No    Alcohol/week: 0.0 standard drinks of alcohol   Drug use: No   Sexual activity:  Never  Other Topics Concern   Not on file  Social History Narrative   Widowed   Social Drivers of Health   Financial Resource Strain: Low Risk  (10/12/2024)   Overall Financial Resource Strain (CARDIA)    Difficulty of Paying Living Expenses: Not hard at all  Food Insecurity: No Food Insecurity (10/12/2024)   Hunger Vital Sign    Worried About Running Out of Food in the Last Year: Never true    Ran Out of Food in the Last Year: Never true  Transportation Needs: No Transportation Needs (10/12/2024)   PRAPARE - Administrator, Civil Service (Medical): No    Lack of Transportation (Non-Medical): No  Physical Activity: Sufficiently Active (10/12/2024)   Exercise Vital Sign    Days of Exercise per Week: 4 days    Minutes of Exercise per Session: 130 min  Stress: No Stress Concern Present (10/12/2024)   [employer] of Occupational Health - Occupational Stress Questionnaire    Feeling of Stress: Not at all  Social Connections: Moderately Integrated (10/12/2024)   Social Connection and Isolation Panel    Frequency of Communication with Friends and Family: More than three times a week    Frequency of Social Gatherings with Friends and Family: Twice a week    Attends Religious Services: More than 4 times per year    Active Member of [employer] or Organizations: Yes    Attends Banker Meetings: More than 4 times per year    Marital Status: Widowed     Review of Systems  Constitutional:  Negative for fever and unexpected weight change.  HENT:  Negative for congestion and sinus pressure.   Respiratory:  Negative for cough, chest tightness and shortness of breath.   Cardiovascular:  Negative for chest pain, palpitations and leg swelling.  Gastrointestinal:  Negative for abdominal pain, diarrhea, nausea and vomiting.  Genitourinary:  Negative for difficulty urinating and dysuria.  Musculoskeletal:  Negative for myalgias.       Right wrist pain as outlined.  Improved pain- chin.   Skin:        Facial swelling and bruising - improved.   Neurological:  Negative for dizziness.       No headache.   Psychiatric/Behavioral:  Negative for agitation and dysphoric mood.        Objective:     BP 138/76 (BP Location: Left Arm, Patient Position: Sitting, Cuff Size: Normal)   Pulse 64   Temp 97.7 F (36.5 C) (Oral)   Ht 5' 1 (1.549 m)   Wt 140 lb 12.8 oz (63.9 kg)   SpO2 98%   BMI 26.60 kg/m  Wt Readings from Last 3 Encounters:  10/14/24 140 lb 12.8 oz (63.9 kg)  07/17/24 135 lb (61.2 kg)  05/28/24 137 lb 12.8 oz (62.5 kg)    Physical Exam Vitals reviewed.  Constitutional:      General: She is not in acute distress.    Appearance: Normal appearance.  HENT:     Head: Normocephalic  and atraumatic.     Right Ear: External ear normal.     Left Ear: External ear normal.     Mouth/Throat:     Pharynx: No oropharyngeal exudate or posterior oropharyngeal erythema.  Eyes:     General: No scleral icterus.       Right eye: No discharge.        Left eye: No discharge.     Conjunctiva/sclera: Conjunctivae normal.  Neck:     Thyroid : No thyromegaly.  Cardiovascular:     Rate and Rhythm: Normal rate and regular rhythm.  Pulmonary:     Effort: No respiratory distress.     Breath sounds: Normal breath sounds. No wheezing.  Abdominal:     General: Bowel sounds are normal.     Palpations: Abdomen is soft.     Tenderness: There is no abdominal tenderness.  Musculoskeletal:        General: No swelling.     Cervical back: Neck supple. No tenderness.     Comments: Increased pain - right wrist.   Lymphadenopathy:     Cervical: No cervical adenopathy.  Skin:    Findings: No rash.     Comments: Resolving bruising - chin/face. Minimal soft tissue swelling - chin. No pain over orbit.   Neurological:     Mental Status: She is alert.  Psychiatric:        Mood and Affect: Mood normal.        Behavior: Behavior normal.         Outpatient  Encounter Medications as of 10/14/2024  Medication Sig   aspirin  81 MG tablet Take 81 mg by mouth daily.   benazepril  (LOTENSIN ) 20 MG tablet Take 1 tablet by mouth 2 (two) times daily.   calcitonin, salmon, (MIACALCIN /FORTICAL) 200 UNIT/ACT nasal spray ONE SPRAY ALTERNATING NOSTRILS EVERY OTHER DAY   Calcium -Phosphorus-Vitamin D  (CITRACAL +D3 PO) Take 1 tablet by mouth daily.   chlorzoxazone  (PARAFON ) 500 MG tablet TAKE 1 TABLET BY MOUTH EVERY DAY AT BEDTIME AS NEEDED. DO NOT TAKE WITH AMBIEN .   Cyanocobalamin  (B-12) 1000 MCG/ML KIT Inject 1,000 mcg as directed every 30 (thirty) days.    GLUCOSAMINE-CHONDROITIN PO Take 1 tablet by mouth 2 (two) times daily.    hydrochlorothiazide (HYDRODIURIL) 12.5 MG tablet Take 12.5 mg by mouth daily.   levothyroxine  (SYNTHROID ) 88 MCG tablet TAKE 1 TABLET BY MOUTH EVERY DAY   Mesalamine  (ASACOL ) 400 MG CPDR DR capsule Take 400 mg by mouth 2 (two) times daily.    pantoprazole  (PROTONIX ) 40 MG tablet Take 40 mg by mouth daily. (Patient taking differently: Take 40 mg by mouth daily as needed.)   polyethylene glycol powder (GLYCOLAX/MIRALAX) 17 GM/SCOOP powder Take by mouth daily as needed.   sotalol  (BETAPACE ) 80 MG tablet Take 0.5 mg by mouth daily.   [DISCONTINUED] zolpidem  (AMBIEN  CR) 6.25 MG CR tablet TAKE 1 TABLET (6.25 MG TOTAL) BY MOUTH AT BEDTIME AS NEEDED. FOR SLEEP   Wheat Dextrin (BENEFIBER DRINK MIX) PACK Take 1 each by mouth daily.  (Patient not taking: Reported on 10/14/2024)   zolpidem  (AMBIEN  CR) 6.25 MG CR tablet Take 1 tablet (6.25 mg total) by mouth at bedtime as needed. for sleep   No facility-administered encounter medications on file as of 10/14/2024.     Lab Results  Component Value Date   WBC 6.0 10/12/2024   HGB 12.0 10/12/2024   HCT 36.4 10/12/2024   PLT 306.0 10/12/2024   GLUCOSE 107 (H) 10/12/2024   CHOL 143  10/12/2024   TRIG 111.0 10/12/2024   HDL 45.90 10/12/2024   LDLCALC 75 10/12/2024   ALT 7 10/12/2024   AST 12  10/12/2024   NA 135 10/12/2024   K 4.1 10/12/2024   CL 103 10/12/2024   CREATININE 1.14 10/12/2024   BUN 22 10/12/2024   CO2 21 10/12/2024   TSH 1.63 10/12/2024   INR 1.0 06/07/2020   HGBA1C 5.9 10/12/2024    US  Abdomen Complete Result Date: 11/21/2023 : PROCEDURE: ULTRASOUND ABDOMEN COMPLETE HISTORY: Patient is a 89 y/o Female with hepatitis. Cholecystectomy. COMPARISON: U/S abdomen 03/25/2023, 03/14/2022. TECHNIQUE: Two-dimensional grayscale, color and spectral Doppler ultrasound of the abdomen was performed. FINDINGS: The pancreas demonstrates a normal homogenous echotexture with the tail suboptimally visualized due to overlying bowel gas. The liver demonstrates mildly increased echotexture without intrahepatic biliary dilatation. No masses are visualized. The main portal vein demonstrates normal hepatopedal flow. The gallbladder is surgically absent. The common bile duct is dilated measuring 1.1 cm, often seen after cholecystectomy. The right kidney measures 9.0 cm in length. Renal cortical echotexture is mildly increased. There is no hydronephrosis. There are no stones. There is 1.7 cm superior pole cortex. The left kidney measures 9.2 cm in length. Renal cortical echotexture is mildly increased. There is no hydronephrosis. There are no stones. There are no cysts. The spleen measures 9.3 cm in length and demonstrates normal echotexture. There is no evidence of aneurysm within the visualized segments of the abdominal aorta. The visualized segments of the IVC are unremarkable. IMPRESSION: 1. Mildly increased hepatic echotexture, consistent with provided history of liver disease/hepatitis. 2. Surgically absent gallbladder with dilatation of the common bile duct, often seen after cholecystectomy. 3. Increased echogenicity of the bilateral renal cortices and a simple cyst, suggestive of medical renal disease. Thank you for allowing us  to assist in the care of this patient. Electronically Signed   By:  Lynwood Mains M.D.   On: 11/21/2023 11:31       Assessment & Plan:  Needs flu shot -     Flu vaccine HIGH DOSE PF(Fluzone  Trivalent)  Right wrist pain Assessment & Plan: Right wrist pain as outlined. Given persistence, check xray. Consider referral to ortho.   Orders: -     DG Wrist Complete Right; Future  Screening cholesterol level -     Lipid panel; Future  Primary hypertension Assessment & Plan: On benazepril  20mg  bid.  On sotalol .   Blood pressure doing well.  Follow metabolic panel. No changes in medication today.   Orders: -     Hepatic function panel; Future -     Basic metabolic panel with GFR; Future  Hyperglycemia Assessment & Plan: Low carb diet and exercise.  Follow met b and A1c.   Orders: -     Hemoglobin A1c; Future  Diarrhea, unspecified type -     CBC with Differential/Platelet; Future  SVT (supraventricular tachycardia) Assessment & Plan: No increased heart rate or palpitations reported currently. Continues f/u with cardiology. Continue sotalol .    Sleep difficulties Assessment & Plan: Requires ambien  to sleep.    MGUS (monoclonal gammopathy of unknown significance) Assessment & Plan: Was worked up by hematology.  Last check - no m spike.  Felt no further w/up warranted.     Hypothyroidism, unspecified type Assessment & Plan: On thyroid  replacement. Follow tsh.    Autoimmune hepatitis (HCC) Assessment & Plan: Followed by GI.  Follow liver function tests. Last abdominal ultrasound ok. Follow up ultrasound due in 10/2024.  B12 deficiency Assessment & Plan: Continue b12 injections.    Barrett's esophagus with esophagitis Assessment & Plan: Upper symptoms controlled.  Continue protonix .    Stage 3a chronic kidney disease (HCC) Assessment & Plan: Avoid antiinflammatories.  Stay hydrated.  Follow metabolic panel.    Other orders -     Zolpidem  Tartrate ER; Take 1 tablet (6.25 mg total) by mouth at bedtime as needed. for sleep   Dispense: 30 tablet; Refill: 2     Allena Hamilton, MD

## 2024-10-15 ENCOUNTER — Other Ambulatory Visit: Payer: Self-pay | Admitting: Gastroenterology

## 2024-10-15 DIAGNOSIS — K754 Autoimmune hepatitis: Secondary | ICD-10-CM

## 2024-10-19 ENCOUNTER — Encounter: Payer: Self-pay | Admitting: Internal Medicine

## 2024-10-19 NOTE — Assessment & Plan Note (Signed)
 Requires ambien  to sleep.

## 2024-10-19 NOTE — Assessment & Plan Note (Signed)
Continue b12 injections.  

## 2024-10-19 NOTE — Assessment & Plan Note (Signed)
Was worked up by hematology.  Last check - no m spike.  Felt no further w/up warranted.

## 2024-10-19 NOTE — Assessment & Plan Note (Signed)
 Low-carb diet and exercise.  Follow met b and A1c.

## 2024-10-19 NOTE — Assessment & Plan Note (Signed)
 No increased heart rate or palpitations reported currently. Continues f/u with cardiology. Continue sotalol .

## 2024-10-19 NOTE — Assessment & Plan Note (Signed)
 Upper symptoms controlled.  Continue protonix .

## 2024-10-19 NOTE — Assessment & Plan Note (Signed)
 Right wrist pain as outlined. Given persistence, check xray. Consider referral to ortho.

## 2024-10-19 NOTE — Assessment & Plan Note (Signed)
 On thyroid replacement.  Follow tsh.

## 2024-10-19 NOTE — Assessment & Plan Note (Signed)
 On benazepril  20mg  bid.  On sotalol .   Blood pressure doing well.  Follow metabolic panel. No changes in medication today.

## 2024-10-19 NOTE — Assessment & Plan Note (Signed)
 Followed by GI.  Follow liver function tests. Last abdominal ultrasound ok. Follow up ultrasound due in 10/2024.

## 2024-10-19 NOTE — Assessment & Plan Note (Signed)
Avoid antiinflammatories.  Stay hydrated.  Follow metabolic panel.   

## 2024-10-20 DIAGNOSIS — W19XXXA Unspecified fall, initial encounter: Secondary | ICD-10-CM | POA: Diagnosis not present

## 2024-10-20 DIAGNOSIS — S52571A Other intraarticular fracture of lower end of right radius, initial encounter for closed fracture: Secondary | ICD-10-CM | POA: Diagnosis not present

## 2024-10-27 DIAGNOSIS — S6291XD Unspecified fracture of right wrist and hand, subsequent encounter for fracture with routine healing: Secondary | ICD-10-CM | POA: Diagnosis not present

## 2024-10-27 DIAGNOSIS — S52591A Other fractures of lower end of right radius, initial encounter for closed fracture: Secondary | ICD-10-CM | POA: Diagnosis not present

## 2024-10-28 ENCOUNTER — Ambulatory Visit

## 2024-10-28 DIAGNOSIS — E538 Deficiency of other specified B group vitamins: Secondary | ICD-10-CM | POA: Diagnosis not present

## 2024-10-28 MED ORDER — CYANOCOBALAMIN 1000 MCG/ML IJ SOLN
1000.0000 ug | Freq: Once | INTRAMUSCULAR | Status: AC
  Administered 2024-10-28: 1000 ug via INTRAMUSCULAR

## 2024-10-28 NOTE — Progress Notes (Signed)
 Patient was administered a B12 injection into her left deltoid. Patient tolerated the B12 injection well.

## 2024-11-18 ENCOUNTER — Ambulatory Visit
Admission: RE | Admit: 2024-11-18 | Discharge: 2024-11-18 | Disposition: A | Discharge status: Home / Self Care | Source: Ambulatory Visit | Attending: Gastroenterology | Admitting: Gastroenterology

## 2024-11-18 DIAGNOSIS — Z9049 Acquired absence of other specified parts of digestive tract: Secondary | ICD-10-CM | POA: Diagnosis not present

## 2024-11-18 DIAGNOSIS — K754 Autoimmune hepatitis: Secondary | ICD-10-CM | POA: Diagnosis not present

## 2024-11-18 DIAGNOSIS — R932 Abnormal findings on diagnostic imaging of liver and biliary tract: Secondary | ICD-10-CM | POA: Diagnosis not present

## 2024-11-19 NOTE — Telephone Encounter (Signed)
 open in error

## 2024-11-24 DIAGNOSIS — S52592D Other fractures of lower end of left radius, subsequent encounter for closed fracture with routine healing: Secondary | ICD-10-CM | POA: Diagnosis not present

## 2024-11-24 DIAGNOSIS — S52501D Unspecified fracture of the lower end of right radius, subsequent encounter for closed fracture with routine healing: Secondary | ICD-10-CM | POA: Diagnosis not present

## 2024-11-24 DIAGNOSIS — S52591A Other fractures of lower end of right radius, initial encounter for closed fracture: Secondary | ICD-10-CM | POA: Diagnosis not present

## 2024-11-30 ENCOUNTER — Ambulatory Visit

## 2024-11-30 DIAGNOSIS — E538 Deficiency of other specified B group vitamins: Secondary | ICD-10-CM

## 2024-11-30 MED ORDER — CYANOCOBALAMIN 1000 MCG/ML IJ SOLN
1000.0000 ug | Freq: Once | INTRAMUSCULAR | Status: AC
  Administered 2024-11-30: 1000 ug via INTRAMUSCULAR

## 2024-11-30 NOTE — Progress Notes (Signed)
 Pt presented for their vitamin B12 injection. Pt was identified through two identifiers. Pt tolerated shot well in their right deltoid.

## 2024-12-12 ENCOUNTER — Other Ambulatory Visit: Payer: Self-pay | Admitting: Internal Medicine

## 2025-01-04 ENCOUNTER — Ambulatory Visit (INDEPENDENT_AMBULATORY_CARE_PROVIDER_SITE_OTHER): Admitting: *Deleted

## 2025-01-04 DIAGNOSIS — E538 Deficiency of other specified B group vitamins: Secondary | ICD-10-CM

## 2025-01-04 MED ORDER — CYANOCOBALAMIN 1000 MCG/ML IJ SOLN
1000.0000 ug | Freq: Once | INTRAMUSCULAR | Status: AC
  Administered 2025-01-04: 1000 ug via INTRAMUSCULAR

## 2025-01-04 NOTE — Progress Notes (Signed)
 Using 2 identifiers, Pt received B12 injection in left  deltoid muscle. Pt tolerated it well with no complaints or concerns.

## 2025-01-18 ENCOUNTER — Telehealth: Payer: Self-pay | Admitting: Internal Medicine

## 2025-01-18 DIAGNOSIS — I10 Essential (primary) hypertension: Secondary | ICD-10-CM

## 2025-01-18 DIAGNOSIS — I471 Supraventricular tachycardia, unspecified: Secondary | ICD-10-CM

## 2025-01-18 NOTE — Telephone Encounter (Signed)
 Pt has dropped off a letter in reference to needing a specialist referral with her new insurance. It's in the color folder up front

## 2025-01-19 NOTE — Telephone Encounter (Signed)
 Please call Ms Galer and let her know that I have placed the order for the referral. Let us  know if any problems. After she is notified, please send message back to me and I will send to [facility], so that they will know why a referral is being ordered.

## 2025-01-19 NOTE — Addendum Note (Signed)
 Addended by: GLENDIA ALLENA RAMAN on: 01/19/2025 08:55 PM   Modules accepted: Orders

## 2025-01-20 NOTE — Telephone Encounter (Signed)
 Pt is aware that referral has been placed.

## 2025-01-21 NOTE — Telephone Encounter (Signed)
 FYI - I have placed a referral to [facility]. Lori [name] already has an appt scheduled. With her new insurance, they are requiring a referral even though she has seen cardiology for years

## 2025-02-04 ENCOUNTER — Ambulatory Visit

## 2025-02-15 ENCOUNTER — Ambulatory Visit

## 2025-02-15 ENCOUNTER — Other Ambulatory Visit

## 2025-02-18 ENCOUNTER — Ambulatory Visit: Admitting: Internal Medicine

## 2025-07-20 ENCOUNTER — Ambulatory Visit
